# Patient Record
Sex: Male | Born: 1935 | Race: White | Hispanic: No | Smoking: Never smoker
Health system: Southern US, Community
[De-identification: ages and names within clinical notes are randomized; demographics above are authoritative.]

## PROBLEM LIST (undated history)

## (undated) DIAGNOSIS — K219 Gastro-esophageal reflux disease without esophagitis: Secondary | ICD-10-CM

## (undated) DIAGNOSIS — I4891 Unspecified atrial fibrillation: Secondary | ICD-10-CM

## (undated) DIAGNOSIS — F329 Major depressive disorder, single episode, unspecified: Secondary | ICD-10-CM

## (undated) DIAGNOSIS — R569 Unspecified convulsions: Secondary | ICD-10-CM

## (undated) DIAGNOSIS — F32A Depression, unspecified: Secondary | ICD-10-CM

## (undated) DIAGNOSIS — C439 Malignant melanoma of skin, unspecified: Secondary | ICD-10-CM

## (undated) DIAGNOSIS — E860 Dehydration: Secondary | ICD-10-CM

## (undated) DIAGNOSIS — G250 Essential tremor: Secondary | ICD-10-CM

## (undated) DIAGNOSIS — N400 Enlarged prostate without lower urinary tract symptoms: Secondary | ICD-10-CM

## (undated) DIAGNOSIS — M1990 Unspecified osteoarthritis, unspecified site: Secondary | ICD-10-CM

## (undated) DIAGNOSIS — I10 Essential (primary) hypertension: Secondary | ICD-10-CM

## (undated) DIAGNOSIS — F419 Anxiety disorder, unspecified: Secondary | ICD-10-CM

## (undated) DIAGNOSIS — R269 Unspecified abnormalities of gait and mobility: Secondary | ICD-10-CM

## (undated) DIAGNOSIS — R251 Tremor, unspecified: Secondary | ICD-10-CM

## (undated) DIAGNOSIS — IMO0001 Reserved for inherently not codable concepts without codable children: Secondary | ICD-10-CM

## (undated) DIAGNOSIS — H9190 Unspecified hearing loss, unspecified ear: Secondary | ICD-10-CM

## (undated) DIAGNOSIS — R55 Syncope and collapse: Secondary | ICD-10-CM

## (undated) DIAGNOSIS — I340 Nonrheumatic mitral (valve) insufficiency: Secondary | ICD-10-CM

## (undated) HISTORY — DX: Nonrheumatic mitral (valve) insufficiency: I34.0

## (undated) HISTORY — DX: Tremor, unspecified: R25.1

## (undated) HISTORY — PX: CATARACT EXTRACTION W/ INTRAOCULAR LENS  IMPLANT, BILATERAL: SHX1307

## (undated) HISTORY — PX: OTHER SURGICAL HISTORY: SHX169

## (undated) HISTORY — DX: Unspecified abnormalities of gait and mobility: R26.9

## (undated) HISTORY — PX: APPENDECTOMY: SHX54

---

## 1998-05-14 ENCOUNTER — Emergency Department (HOSPITAL_COMMUNITY): Admission: EM | Admit: 1998-05-14 | Discharge: 1998-05-14 | Payer: Self-pay | Admitting: Emergency Medicine

## 2001-02-03 ENCOUNTER — Ambulatory Visit (HOSPITAL_COMMUNITY): Admission: RE | Admit: 2001-02-03 | Discharge: 2001-02-03 | Payer: Self-pay | Admitting: Gastroenterology

## 2001-09-05 ENCOUNTER — Encounter: Payer: Self-pay | Admitting: Geriatric Medicine

## 2001-09-05 ENCOUNTER — Encounter: Admission: RE | Admit: 2001-09-05 | Discharge: 2001-09-05 | Payer: Self-pay | Admitting: Geriatric Medicine

## 2003-11-10 ENCOUNTER — Emergency Department (HOSPITAL_COMMUNITY): Admission: EM | Admit: 2003-11-10 | Discharge: 2003-11-10 | Payer: Self-pay | Admitting: Emergency Medicine

## 2003-11-28 ENCOUNTER — Inpatient Hospital Stay (HOSPITAL_COMMUNITY): Admission: AD | Admit: 2003-11-28 | Discharge: 2003-12-02 | Payer: Self-pay | Admitting: Emergency Medicine

## 2003-11-29 ENCOUNTER — Encounter (INDEPENDENT_AMBULATORY_CARE_PROVIDER_SITE_OTHER): Payer: Self-pay | Admitting: *Deleted

## 2005-11-18 ENCOUNTER — Emergency Department (HOSPITAL_COMMUNITY): Admission: EM | Admit: 2005-11-18 | Discharge: 2005-11-18 | Payer: Self-pay | Admitting: Emergency Medicine

## 2008-09-02 ENCOUNTER — Emergency Department (HOSPITAL_COMMUNITY): Admission: EM | Admit: 2008-09-02 | Discharge: 2008-09-02 | Payer: Self-pay | Admitting: Emergency Medicine

## 2008-09-07 ENCOUNTER — Inpatient Hospital Stay (HOSPITAL_COMMUNITY): Admission: EM | Admit: 2008-09-07 | Discharge: 2008-09-11 | Payer: Self-pay | Admitting: Emergency Medicine

## 2008-10-11 ENCOUNTER — Emergency Department (HOSPITAL_COMMUNITY): Admission: EM | Admit: 2008-10-11 | Discharge: 2008-10-11 | Payer: Self-pay | Admitting: Emergency Medicine

## 2008-12-06 ENCOUNTER — Emergency Department (HOSPITAL_COMMUNITY): Admission: EM | Admit: 2008-12-06 | Discharge: 2008-12-06 | Payer: Self-pay | Admitting: Emergency Medicine

## 2009-03-15 ENCOUNTER — Emergency Department (HOSPITAL_COMMUNITY): Admission: EM | Admit: 2009-03-15 | Discharge: 2009-03-16 | Payer: Self-pay | Admitting: Emergency Medicine

## 2009-03-16 ENCOUNTER — Emergency Department (HOSPITAL_COMMUNITY): Admission: EM | Admit: 2009-03-16 | Discharge: 2009-03-16 | Payer: Self-pay | Admitting: Emergency Medicine

## 2010-08-08 ENCOUNTER — Inpatient Hospital Stay (HOSPITAL_COMMUNITY)
Admission: EM | Admit: 2010-08-08 | Discharge: 2010-08-12 | Payer: Self-pay | Source: Home / Self Care | Attending: Internal Medicine | Admitting: Internal Medicine

## 2010-08-22 ENCOUNTER — Encounter
Admission: RE | Admit: 2010-08-22 | Discharge: 2010-09-26 | Payer: Self-pay | Source: Home / Self Care | Attending: Neurology | Admitting: Neurology

## 2010-09-05 ENCOUNTER — Encounter
Admission: RE | Admit: 2010-09-05 | Discharge: 2010-09-26 | Payer: Self-pay | Source: Home / Self Care | Attending: Neurology | Admitting: Neurology

## 2010-09-28 ENCOUNTER — Ambulatory Visit: Payer: Medicare Other | Attending: Neurology | Admitting: Rehabilitative and Restorative Service Providers"

## 2010-09-28 DIAGNOSIS — M6281 Muscle weakness (generalized): Secondary | ICD-10-CM | POA: Insufficient documentation

## 2010-09-28 DIAGNOSIS — IMO0001 Reserved for inherently not codable concepts without codable children: Secondary | ICD-10-CM | POA: Insufficient documentation

## 2010-09-28 DIAGNOSIS — R269 Unspecified abnormalities of gait and mobility: Secondary | ICD-10-CM | POA: Insufficient documentation

## 2010-10-06 ENCOUNTER — Ambulatory Visit: Payer: Medicare Other | Admitting: Rehabilitative and Restorative Service Providers"

## 2010-10-10 ENCOUNTER — Ambulatory Visit: Payer: Medicare Other | Admitting: Rehabilitative and Restorative Service Providers"

## 2010-10-13 ENCOUNTER — Ambulatory Visit: Payer: Medicare Other | Admitting: Rehabilitative and Restorative Service Providers"

## 2010-10-17 ENCOUNTER — Ambulatory Visit: Payer: Medicare Other | Admitting: Rehabilitative and Restorative Service Providers"

## 2010-10-20 ENCOUNTER — Ambulatory Visit: Payer: Medicare Other | Admitting: Rehabilitative and Restorative Service Providers"

## 2010-10-24 ENCOUNTER — Ambulatory Visit: Payer: Medicare Other | Admitting: Rehabilitative and Restorative Service Providers"

## 2010-10-26 ENCOUNTER — Ambulatory Visit: Payer: Medicare Other | Attending: Neurology | Admitting: Rehabilitative and Restorative Service Providers"

## 2010-10-26 DIAGNOSIS — IMO0001 Reserved for inherently not codable concepts without codable children: Secondary | ICD-10-CM | POA: Insufficient documentation

## 2010-10-26 DIAGNOSIS — M6281 Muscle weakness (generalized): Secondary | ICD-10-CM | POA: Insufficient documentation

## 2010-10-26 DIAGNOSIS — R269 Unspecified abnormalities of gait and mobility: Secondary | ICD-10-CM | POA: Insufficient documentation

## 2010-10-31 ENCOUNTER — Ambulatory Visit: Payer: Medicare Other | Admitting: Rehabilitative and Restorative Service Providers"

## 2010-11-02 ENCOUNTER — Ambulatory Visit: Payer: Medicare Other | Admitting: Rehabilitative and Restorative Service Providers"

## 2010-11-06 LAB — CBC
HCT: 37.6 % — ABNORMAL LOW (ref 39.0–52.0)
HCT: 38.5 % — ABNORMAL LOW (ref 39.0–52.0)
Hemoglobin: 13 g/dL (ref 13.0–17.0)
Hemoglobin: 13.3 g/dL (ref 13.0–17.0)
MCH: 31.4 pg (ref 26.0–34.0)
MCHC: 35 g/dL (ref 30.0–36.0)
MCHC: 34.6 g/dL (ref 30.0–36.0)
MCV: 91.8 fL (ref 78.0–100.0)
MCV: 92.3 fL (ref 78.0–100.0)
Platelets: 229 10*3/uL (ref 150–400)
RBC: 3.99 MIL/uL — ABNORMAL LOW (ref 4.22–5.81)
RBC: 4.17 MIL/uL — ABNORMAL LOW (ref 4.22–5.81)
WBC: 9 10*3/uL (ref 4.0–10.5)

## 2010-11-06 LAB — URINALYSIS, ROUTINE W REFLEX MICROSCOPIC
Bilirubin Urine: NEGATIVE
Glucose, UA: NEGATIVE mg/dL
Specific Gravity, Urine: 1.01 (ref 1.005–1.030)
Urobilinogen, UA: 0.2 mg/dL (ref 0.0–1.0)
pH: 7.5 (ref 5.0–8.0)

## 2010-11-06 LAB — BASIC METABOLIC PANEL
BUN: 14 mg/dL (ref 6–23)
CO2: 27 mEq/L (ref 19–32)
CO2: 27 mEq/L (ref 19–32)
Calcium: 8.7 mg/dL (ref 8.4–10.5)
Chloride: 102 mEq/L (ref 96–112)
Creatinine, Ser: 0.8 mg/dL (ref 0.4–1.5)
GFR calc Af Amer: >60 mL/min (ref >60)
GFR calc non Af Amer: >60 mL/min (ref >60)
GFR calc non Af Amer: >60 mL/min (ref >60)
GFR calc non Af Amer: >60 mL/min (ref >60)
Glucose, Bld: 101 mg/dL — ABNORMAL HIGH (ref 70–99)
Potassium: 3.7 mEq/L (ref 3.5–5.1)
Potassium: 3.4 mEq/L — ABNORMAL LOW (ref 3.5–5.1)
Potassium: 3.8 mEq/L (ref 3.5–5.1)
Sodium: 133 mEq/L — ABNORMAL LOW (ref 135–145)
Sodium: 133 mEq/L — ABNORMAL LOW (ref 135–145)

## 2010-11-06 LAB — HEMOCCULT GUIAC POC 1CARD (OFFICE): Fecal Occult Bld: POSITIVE

## 2010-11-06 LAB — CARDIAC PANEL(CRET KIN+CKTOT+MB+TROPI): Relative Index: 1.6 (ref 0.0–2.5)

## 2010-11-21 ENCOUNTER — Encounter: Payer: Medicare Other | Admitting: Rehabilitative and Restorative Service Providers"

## 2010-11-23 ENCOUNTER — Encounter: Payer: Medicare Other | Admitting: Rehabilitative and Restorative Service Providers"

## 2010-12-03 LAB — URINALYSIS, ROUTINE W REFLEX MICROSCOPIC
Glucose, UA: NEGATIVE mg/dL
Leukocytes, UA: NEGATIVE
Protein, ur: NEGATIVE mg/dL
pH: 7.5 (ref 5.0–8.0)

## 2010-12-03 LAB — CBC
Platelets: 270 10*3/uL (ref 150–400)
RDW: 12.3 % (ref 11.5–15.5)

## 2010-12-03 LAB — DIFFERENTIAL
Basophils Absolute: 0 10*3/uL (ref 0.0–0.1)
Lymphocytes Relative: 53 % — ABNORMAL HIGH (ref 12–46)
Neutro Abs: 3 10*3/uL (ref 1.7–7.7)

## 2010-12-03 LAB — BASIC METABOLIC PANEL
BUN: 18 mg/dL (ref 6–23)
CO2: 25 mEq/L (ref 19–32)
Chloride: 101 mEq/L (ref 96–112)
Creatinine, Ser: 1.02 mg/dL (ref 0.4–1.5)
Glucose, Bld: 132 mg/dL — ABNORMAL HIGH (ref 70–99)
Potassium: 3.9 mEq/L (ref 3.5–5.1)
Sodium: 135 mEq/L (ref 135–145)

## 2010-12-03 LAB — POCT I-STAT, CHEM 8
BUN: 20 mg/dL (ref 6–23)
Chloride: 101 mEq/L (ref 96–112)
Potassium: 4 mEq/L (ref 3.5–5.1)
Sodium: 135 mEq/L (ref 135–145)

## 2010-12-03 LAB — URINE MICROSCOPIC-ADD ON

## 2010-12-06 LAB — POCT I-STAT, CHEM 8
Creatinine, Ser: 0.8 mg/dL (ref 0.4–1.5)
Glucose, Bld: 103 mg/dL — ABNORMAL HIGH (ref 70–99)
Hemoglobin: 15.3 g/dL (ref 13.0–17.0)
Potassium: 3.6 mEq/L (ref 3.5–5.1)

## 2010-12-11 LAB — CBC
HCT: 35.6 % — ABNORMAL LOW (ref 39.0–52.0)
HCT: 43 % (ref 39.0–52.0)
Hemoglobin: 12.2 g/dL — ABNORMAL LOW (ref 13.0–17.0)
Hemoglobin: 14.9 g/dL (ref 13.0–17.0)
MCHC: 34.6 g/dL (ref 30.0–36.0)
MCHC: 34.7 g/dL (ref 30.0–36.0)
MCHC: 35.1 g/dL (ref 30.0–36.0)
MCV: 93.3 fL (ref 78.0–100.0)
MCV: 93.5 fL (ref 78.0–100.0)
MCV: 94.5 fL (ref 78.0–100.0)
Platelets: 287 10*3/uL (ref 150–400)
RBC: 4.09 MIL/uL — ABNORMAL LOW (ref 4.22–5.81)
RBC: 4.42 MIL/uL (ref 4.22–5.81)
RDW: 13.2 % (ref 11.5–15.5)
RDW: 13.3 % (ref 11.5–15.5)
RDW: 13.4 % (ref 11.5–15.5)
WBC: 8.4 10*3/uL (ref 4.0–10.5)

## 2010-12-11 LAB — DIFFERENTIAL
Basophils Absolute: 0 10*3/uL (ref 0.0–0.1)
Basophils Relative: 0 % (ref 0–1)
Eosinophils Absolute: 0 10*3/uL (ref 0.0–0.7)
Eosinophils Relative: 0 % (ref 0–5)
Eosinophils Relative: 0 % (ref 0–5)
Lymphocytes Relative: 33 % (ref 12–46)
Lymphs Abs: 2.8 10*3/uL (ref 0.7–4.0)
Monocytes Absolute: 0.5 10*3/uL (ref 0.1–1.0)
Monocytes Absolute: 0.8 10*3/uL (ref 0.1–1.0)
Neutro Abs: 3.6 10*3/uL (ref 1.7–7.7)
Neutro Abs: 4.8 10*3/uL (ref 1.7–7.7)

## 2010-12-11 LAB — COMPREHENSIVE METABOLIC PANEL
ALT: 25 U/L (ref 0–53)
AST: 37 U/L (ref 0–37)
Albumin: 3.6 g/dL (ref 3.5–5.2)
Alkaline Phosphatase: 61 U/L (ref 39–117)
BUN: 11 mg/dL (ref 6–23)
Chloride: 96 mEq/L (ref 96–112)
GFR calc Af Amer: >60 mL/min (ref >60)
Potassium: 4.1 mEq/L (ref 3.5–5.1)
Sodium: 132 mEq/L — ABNORMAL LOW (ref 135–145)
Total Bilirubin: 0.9 mg/dL (ref 0.3–1.2)
Total Protein: 6.4 g/dL (ref 6.0–8.3)

## 2010-12-11 LAB — POCT CARDIAC MARKERS
CKMB, poc: 1.4 ng/mL (ref 1.0–8.0)
Myoglobin, poc: 191 ng/mL (ref 12–200)
Troponin i, poc: <0.05 ng/mL (ref 0.00–0.09)
Troponin i, poc: <0.05 ng/mL (ref 0.00–0.09)

## 2010-12-11 LAB — GLUCOSE, CAPILLARY: Glucose-Capillary: 142 mg/dL — ABNORMAL HIGH (ref 70–99)

## 2010-12-11 LAB — POCT I-STAT, CHEM 8
BUN: 17 mg/dL (ref 6–23)
Calcium, Ion: 1.12 mmol/L (ref 1.12–1.32)
Calcium, Ion: 1.16 mmol/L (ref 1.12–1.32)
Chloride: 96 mEq/L (ref 96–112)
Creatinine, Ser: 1.1 mg/dL (ref 0.4–1.5)
HCT: 44 % (ref 39.0–52.0)
Hemoglobin: 15 g/dL (ref 13.0–17.0)
Sodium: 131 mEq/L — ABNORMAL LOW (ref 135–145)
TCO2: 21 mmol/L (ref 0–100)
TCO2: 28 mmol/L (ref 0–100)

## 2010-12-11 LAB — BASIC METABOLIC PANEL
BUN: 10 mg/dL (ref 6–23)
CO2: 24 mEq/L (ref 19–32)
Calcium: 8.4 mg/dL (ref 8.4–10.5)
Calcium: 8.7 mg/dL (ref 8.4–10.5)
Creatinine, Ser: 1.11 mg/dL (ref 0.4–1.5)
GFR calc Af Amer: >60 mL/min (ref >60)
GFR calc Af Amer: >60 mL/min (ref >60)
GFR calc non Af Amer: >60 mL/min (ref >60)
GFR calc non Af Amer: >60 mL/min (ref >60)
GFR calc non Af Amer: >60 mL/min (ref >60)
Potassium: 3.9 mEq/L (ref 3.5–5.1)
Sodium: 131 mEq/L — ABNORMAL LOW (ref 135–145)
Sodium: 132 mEq/L — ABNORMAL LOW (ref 135–145)
Sodium: 133 mEq/L — ABNORMAL LOW (ref 135–145)

## 2010-12-11 LAB — CARDIAC PANEL(CRET KIN+CKTOT+MB+TROPI)
CK, MB: 6.2 ng/mL — ABNORMAL HIGH (ref 0.3–4.0)
CK, MB: 6.9 ng/mL — ABNORMAL HIGH (ref 0.3–4.0)
Relative Index: 1.6 (ref 0.0–2.5)
Total CK: 450 U/L — ABNORMAL HIGH (ref 7–232)

## 2010-12-11 LAB — CK TOTAL AND CKMB (NOT AT ARMC)
CK, MB: 5.5 ng/mL — ABNORMAL HIGH (ref 0.3–4.0)
Total CK: 239 U/L — ABNORMAL HIGH (ref 7–232)

## 2010-12-12 LAB — POCT I-STAT, CHEM 8
BUN: 15 mg/dL (ref 6–23)
Chloride: 98 mEq/L (ref 96–112)
HCT: 46 % (ref 39.0–52.0)
Sodium: 135 mEq/L (ref 135–145)
TCO2: 31 mmol/L (ref 0–100)

## 2011-01-09 NOTE — H&P (Signed)
Barry Dean, Barry Dean               ACCOUNT NO.:  [id number]   MEDICAL RECORD NO.:  [id number]          PATIENT TYPE:  INP   LOCATION:  1858                         FACILITY:  [facility]   PHYSICIAN:  Hollice Espy, M.D.DATE OF BIRTH:  [birth year]   DATE OF ADMISSION:  09/07/2008  DATE OF DISCHARGE:                              HISTORY & PHYSICAL   PRIMARY CARE PHYSICIAN:  Hal T. Stoneking, M.D..   CHIEF COMPLAINT:  Syncope.   HISTORY OF PRESENT ILLNESS:  The patient is an 77 year old white male  with past medical history of hypertension and what is presumed to be a  mitral valve regurgitation who presents to the emergency room after a  syncopal episode.  He previously has been doing well with no complaints.  He said he woke up this morning feeling fine, had breakfast.  When he  went to shave he said that is when all of a sudden he felt very  nauseous, got lightheaded and he does not recall much until he woke up  in the emergency room.  Reportedly paramedics were called and he had  some rhythm strips done which showed some mild bradycardia.  There was  an interpretation of acute MI.  However on further interpretation this  was found to be incorrect and the patient was then brought into the  emergency room for further evaluation and treatment.  When the patient  came into the emergency room his blood pressures were noted to be in the  140s.  The patient currently states that he is feeling fine.  Denies any  headaches, vision changes, dysphagia, chest pain, palpitations,  shortness of breath, wheezing, coughing.  No abdominal pain.  No  hematuria, dysuria, constipation, diarrhea, numbness, weakness or pain.  He said he has not really ever gotten any syncopal episodes other than a  previous time several years ago when he took a medicine called Uroxatral  that made him pass out.  He has not had any other nausea problems,  syncopal problems.  His breathing has been rough at night in the  last  few months.  He also had an episode of chest pain that brought him to  the emergency room last week, which was evaluated and felt to be  noncardiac.  The patient otherwise is doing well.   REVIEW OF SYSTEMS:  Otherwise negative.   PAST MEDICAL HISTORY:  Includes:  1. Hypertension.  2. BPH.  3. And reportedly a valvular disorder although I cannot find any      documentation of this.  An echocardiogram done in April 2005 notes      a normal ejection fraction with mildly dilated right ventricle and      borderline left atrium with no valvular disorder reported at his      time, although family states that he has a leaky valve.   MEDICATIONS:  1. The patient is currently on Toprol 50.  2. And Lotrel 5/20.  3. He is on p.r.n. metoprolol.  4. As well as Ativan daily.  5. He does take a multivitamin.   ALLERGIES:  States that  his only allergy is to Uroxatral which made him  pass out and which was documented several years ago.   He denies any tobacco, alcohol or drug use.   FAMILY HISTORY:  Noncontributory.   PHYSICAL EXAMINATION:  VITALS:  On admission temperature 97.6, heart  rate 80, blood pressure 140/52, respirations 22, O2 sat 96% on room air.  IN GENERAL: He is alert and oriented x3.  Currently in no apparent  distress.  HEENT:  Is normocephalic, atraumatic.  His mucous membranes are slightly  dry.  He has no carotid bruits.  HEART:  Regular rate and rhythm.  S1, S2.  He has a very, very soft,  subtle 2/6 systolic ejection murmur.  LUNGS:  Clear to auscultation bilaterally.  ABDOMEN:  Soft, nontender, nondistended.  Positive bowel sounds.  I can appreciate no carotid bruits.  EXTREMITIES:  Show no clubbing, cyanosis or edema.  He has no focal  neurological deficits.  MUSCULOSKELETAL EXAM:  Looks to be just overall minimally diminished but  symmetric.  No focal findings.  He has no loss of sensation.  Otherwise  normal musculoskeletal and neurological exam with grip  extension and  flexion of all extremities.   LABORATORY WORK:  White count 8.5, H and H 12.2 and 36.  MCV 97,  platelet count 257, no shift.  Sodium 131, potassium 4.6, chloride 96,  BUN 17, creatinine 1.1, glucose 145.  CPK 191, MB less than 1, troponin  I less than 0.05.  EKG shows a normal sinus rhythm.  CT scan of the head  shows no evidence of any acute intracranial abnormalities.  Chest x-ray  showed bibasilar fibrosis but no acute cardiopulmonary disease.   ASSESSMENT/PLAN:  Syncope. It is possible this could be from some  subclavian stenoses given his reported history of this happening while  shaving, although this is the first time he said that this has ever  happened and he shaves daily versus arrhythmia versus valvular disorder  versus carotid disease versus other.  We will observe him on telemetry.  Get a Cardiology consult.  The patient sees Dr. Corliss Marcus.  We will  also check bilateral arm blood pressures, monitor.  His family had  brought in his blood pressures and it is noted that over the last week  he has had problems with elevated blood pressure at times in the 180s-  190s with some tachycardia in the 80s to 90s as well, so we will  continue to observe.  I will at this time continue his antihypertensives  and we will go from there.      Hollice Espy, M.D.  Electronically Signed     SKK/MEDQ  D:  09/07/2008  T:  09/07/2008  Job:  [id number]   cc:   Hal T. Stoneking, M.D.  Francisca December, M.D.

## 2011-01-09 NOTE — Consult Note (Signed)
NAMECLOYDE, Barry Dean               ACCOUNT NO.:  [id number]   MEDICAL RECORD NO.:  [id number]          PATIENT TYPE:  INP   LOCATION:  3739                         FACILITY:  [facility]   PHYSICIAN:  Francisca December, M.D.  DATE OF BIRTH:  10/06/1923   DATE OF CONSULTATION:  09/07/2008  DATE OF DISCHARGE:                                 CONSULTATION   REASON FOR CONSULTATION:  Syncope.   HISTORY OF PRESENT ILLNESS:  Mr. Samaj Wessells is a pleasant 77 year old  man who had complained of dizziness throughout the morning of admission  which was September 07, 2008.  He was being readied for blood pressure  checked at Dr. Laverle Hobby office when he had a syncopal spell.  He did  have some dizziness and nausea prior to this.  He denies any chest pain.  He had a syncopal spell last week which included a fall in the bathroom  and shaking all over.  He was seen in the ER on September 02, 2008 for  this and the record states he had chest discomfort, but currently the  patient denies this.  The patient does have a history of syncope in  2005, which had a negative workup including an exercise Cardiolite in  our office.  He has had some dyspnea which is transient and  unpredictable.  In general, the patient is a poor historian.  His wife  is present in the room today and has clarified the incident of last  week.  The patient did fall in the bathroom and shake all over.  He was  not responsive for a period of time and there was no loss of bladder or  bowel control.   PAST MEDICAL HISTORY:  1. Hypertension.  2. Benign prostatic hypertrophy.  3. Syncope in 2005, felt secondary to Uroxatral.  4. History of PAF, asymptomatic.  5. Mitral regurgitation.   CURRENT MEDICATIONS:  1. Lotrel 5/20 one p.o. daily.  2. Toprol-XL 50 mg p.o. daily.  3. Ativan 1 mg p.o. t.i.d. p.r.n.   SOCIAL HISTORY:  Lives in [place] with his wife.  He is retired.  Does not use any alcohol or tobacco products.   FAMILY  HISTORY:  Noncontributory.   REVIEW OF SYSTEMS:  Denies fever, chills, sweats, headache, nasal  congestion, nasal bleeding, or vertigo.  He has had the lightheaded or  dizzy symptoms.  No skin rash or lesions.  No polyuria or polydipsia.  No heat or cold intolerance.  Currently, no urinary symptoms.  No  arthralgia, joint swelling, muscle weakness, or pain.  No nausea,  vomiting, diarrhea, constipation, or melena.   A 2-D echocardiogram obtained in August 2009 showed a posterior mitral  valve leaflet prolapse with a full segment seen in 3-chamber view with  moderate-to-severe mitral regurgitation which was anteriorly directed.  It had progressed significantly since 2007.   PHYSICAL EXAMINATION:  VITAL SIGNS:  Blood pressure on admission 140/52,  pulse is 80 and regular, temperature is 97.6, respiratory rate 22, and  O2 saturation on 2 L is 100%.  GENERAL:  This is a well-appearing 77 year old Caucasian man  in no  distress.  HEENT:  Unremarkable.  Head is atraumatic and normocephalic.  Pupils are  equal, round, and reactive to light.  The sclerae are anicteric.  The  oral mucosa is pink and moist.  Teeth and gums are in good repair.  Tongue is not coated.  NECK:  Supple without thyromegaly or masses.  The carotid upstrokes are  normal.  There is no jugular venous distention.  There is no carotid  bruit.  CHEST:  Clear with adequate excursion.  There are no wheezes, rales, or  rhonchi.  HEART:  Regular rhythm.  Normal S1 and S2 is heard.  A grade 2-3/6  holosystolic murmur is present at the apex which does radiate toward the  base.  There is a soft ejection systolic murmur in the aortic area as  well.  The PMI is not palpable.  ABDOMEN:  Soft, flat, and nontender.  No hepatosplenomegaly or midline  pulsatile mass.  EXTREMITIES:  Lower extremities have no edema and distal pulses are  intact.  MUSCULOSKELETAL:  Unremarkable.  The patient is able to stand and walk.  NEUROLOGIC:   Cranial nerves II-XII are intact.  Motor and sensory  grossly intact.  He is alert and oriented x3 and has a normal mood and  affect.   LABORATORY DATA:  Electrocardiogram on admission shows sinus rhythm,  nonspecific T-wave abnormalities, and rate of 84 beats per minute.  Point-of-care enzymes negative x1 in the emergency room.  Chest x-ray  shows bibasilar fibrosis which is chronic.  His head CT is nonacute  without contrast.  Laboratory assessment otherwise unremarkable for CBC  and BMET.   ASSESSMENT:  1. Syncopal episodes but with extensive musculoskeletal component in      the way of shaking all over.  Certainly concerned for seizure      disorder.  However, the initial brain hypoxia can certainly produce      these similar symptoms, although briefly.  2. Hypertension, reinitiate blood pressure medications in the near      future.  3. Benign prostatic hypertrophy.  4. Murmur/mitral regurgitation which is moderately severe, does not      appear to be playing a role in these symptoms.  He denies      significant worsening shortness of breath.   PLAN:  1. I agree with your management thus far.  We will obtain orthostatic      blood pressures, monitor his heart rhythm, and obtain cardiac      enzymes.  2. Suggest neurologic evaluation.  3. We will obtain 2-D echocardiogram report from the office, perhaps      repeat here in the hospital.  4. Further assessment to be based on the results of the above.      Francisca December, M.D.  Electronically Signed     JHE/MEDQ  D:  09/08/2008  T:  09/08/2008  Job:  [id number]   cc:   Hal T. Stoneking, M.D.

## 2011-01-09 NOTE — Consult Note (Signed)
[name], Barry Dean               ACCOUNT NO.:  [id number]   MEDICAL RECORD NO.:  [id number]          PATIENT TYPE:  INP   LOCATION:  3739                         FACILITY:  [facility]   PHYSICIAN:  Marlan Palau, M.D.  DATE OF BIRTH:  Sep 12, 1923   DATE OF CONSULTATION:  DATE OF DISCHARGE:                                 CONSULTATION   HISTORY OF PRESENT ILLNESS:  Barry Dean is an 77 year old right-  handed white male born in 09/29/1923, with a history of  hypertension.  The patient has suffered syncopal event in 2005, and  workup at that point was unremarkable.  The patient was felt to have had  a reaction to Uroxatral that he had just been started on.  The patient  has gone several years without any problems, but on the day of this  admission the patient has had 2 such events.  The patient had the first  unwitnessed event while shaving this morning, felt dizzy right before  the event, does not remember anything until he got to the hospital.  The  patient had a witnessed second event in the hospital when he was  standing up getting orthostatic blood pressures and then suddenly again  felt dizzy, was placed back to bed and had a generalized seizure event,  and was postictal for at least 30 minutes following the event.  The  patient has had head scan that shows evidence of posterior white matter  changes, but nothing acute was seen.  Neurology was asked to see this  patient for further evaluation.  The patient did have urinary  incontinence with the event today in the hospital.   PAST MEDICAL HISTORY:  Significant for:  1. Syncope/seizure.  Description is most consistent with a seizure      event today.  2. Hypertension.  3. Benign prostatic hypertrophy.  4. Bilateral cataract surgery.  5. Appendectomy for appendicitis.   CURRENT MEDICATIONS:  1. Lotrel 5/20 mg daily.  2. Toprol-XL 50 mg daily.  3. Flomax 0.4 mg at night.  4. Ativan 0.5 mg 1 at night.  5.  Multivitamins.  6. Aspirin 81 mg daily.   SOCIAL HISTORY:  The patient does not smoke or drink.  This patient is  married, lives in the [place], [place] area, has 2 children.  One child died in early age around 4, cause is not clear.  Other child  is alive and well.  The patient is retired.   FAMILY MEDICAL HISTORY:  Notable with both parents have passed away.  The patient believes that his mother died with heart attack and father  died of old age.  The patient has 1 sister with senile dementia of  Alzheimer type.  One brother living in good health and one brother has  passed away, cause unknown.   REVIEW OF SYSTEMS:  Notable for no recent fevers or chills.  The patient  denies headache, vision changes, numbness or weakness on arms, legs.  Denies chest pain, shortness of breath.  Did note some nausea, vomiting  earlier today.  Denies any problems controlling the  bowels or bladder  usually.   PHYSICAL EXAMINATION:  VITALS:  Blood pressure is currently 155/79,  heart rate 76, respiratory rate 20, and temperature afebrile.  GENERAL:  This patient is a fairly well-developed elderly white male who  is alert and cooperative at the time of examination.  The patient is  hard of hearing, has bilateral hearing aids.  HEENT:  Head is atraumatic.  Eyes:  Pupils are postsurgical round and  reactive to light.  Disks are flat bilaterally.  NECK:  Supple.  No carotid bruits are noted.  RESPIRATORY:  Clear.  CARDIOVASCULAR:  Regular rate and rhythm.  No obvious murmurs or rubs  noted.  EXTREMITIES:  Without significant edema.  NEUROLOGIC:  Cranial nerves as above.  Facial symmetry is present.  The  patient has good sensation of face to pinprick, soft touch bilaterally.  He has good strength of facial muscles and the muscles of head turn and  shoulder shrug bilaterally.  Speech is well enunciated, not aphasic.  Again extraocular movements are full.  Visual fields are full.  Motor  test  reveals good strength in all fours.  Good symmetric motor tone is  noted throughout.  Sensory testing is intact to pinprick, soft touch  sensation throughout.  The patient has stocking-glove pinprick sensory  deficit up to the knees with significant impairment of vibratory  sensation of both feet, but in a symmetric fashion more normal on the  arms.  The patient has fairly good finger-nose-finger and toe-to-finger  bilaterally.  Gait was not tested.  Deep tendon reflexes are symmetric,  depressed at the ankles.  Toes are neutral bilaterally.   LABORATORY VALUES:  Notable for a white count of 8.4, hemoglobin of  12.2, hematocrit of 35.6, MCV of 95.7, platelets of 257.  Sodium 131,  potassium 4.6, chloride of 96, glucose of 145, BUN of 17, creatinine of  1.1, CK of 239, MB fraction of 5.5, troponin I of 0.02.   CT scan of the head is as above.   IMPRESSION:  1. History of syncope/seizure.  2. Hypertension.   This patient had a witnessed event in the hospital today that appeared  to be most consistent with a seizure event.  No clear blood pressure  drop was noted prior to the onset of the event.  The patient had a  generalized jerking with a postictal period.  The patient does have some  posterior white matter changes, representing cerebrovascular disease by  CT scan, which may be a cause of underlying seizures.  I will pursue  further workup at this point.   PLAN:  1. We will obtain an MRI of the brain.  2. MRI angiogram of the intracranial vessels.  3. EEG study.  4. Cardiac monitoring.  5. We will initiate oral Dilantin therapy at this point due to      presence of a probable peripheral neuropathy by examination.  We      will check B12 level in a.m.      Marlan Palau, M.D.  Electronically Signed     CKW/MEDQ  D:  09/07/2008  T:  09/08/2008  Job:  [id number]   cc:   Hal T. Stoneking, M.D.

## 2011-01-09 NOTE — Procedures (Signed)
CLINICAL HISTORY:  An 77 year old gentleman who was admitted for a  syncopal episode.  He became lightheaded and nauseous prior to the  episode.   MEDICATIONS:  1. Dilantin.  2. Norvasc.  3. Zestril.  4. Toprol.  5. Lovenox.  6. Aspirin.  7. Protonix.  8. Ambien.  9. Ativan.   TECHNICAL COMMENT:  An 18-channel EEG was performed based on standard  international 10-20 system.  Total recording time 23 minutes,  A 17th  channel was dedicated to EKG which demonstrated normal sinus rhythm of  72 beats per minutes.   Upon awakening, the posterior background activity was well developed, 9  Hz, symmetric, reactive to eye opening and closure.  There was no  epileptiform discharge recorded.  Photic stimulation with flash  frequency 1-19 Hz and hyperventilation was attempted.  There was not  abnormality elicited.  The patient was not able to retrieve sleep during  recording.   CONCLUSION:  This is a normal awake EEG.  There is no evidence of  epileptiform discharge.      Levert Feinstein, MD  Electronically Signed     UE:AVWU  D:  09/09/2008 16:19:43  T:  09/10/2008 05:46:43  Job #:  [id number]

## 2011-01-09 NOTE — Discharge Summary (Signed)
Barry Dean               ACCOUNT NO.:  [id number]   MEDICAL RECORD NO.:  [id number]          PATIENT TYPE:  INP   LOCATION:  3739                         FACILITY:  [facility]   PHYSICIAN:  Barry Harvest, MD    DATE OF BIRTH:  04/12/1924   DATE OF ADMISSION:  09/07/2008  DATE OF DISCHARGE:  09/10/2008                               DISCHARGE SUMMARY   PRIMARY CARE PHYSICIAN:  Dr. Merlene Dean.   DISCHARGE DIAGNOSES:  1. Syncope likely secondary to seizures.  2. New onset seizures.  3. Hypertension.  4. Benign prostatic hypertrophy.  5. Bilateral cataract surgery.  6. Status post appendectomy.  7. History of paroxysmal atrial fibrillation.  8. History of mitral regurgitation.  9. Prior history of syncope in 2005 felt to be secondary to Urotraxil.   DISCHARGE MEDICATIONS:  1. Lotrel 05/20 mg p.o. daily  2. Toprol XL 50 mg p.o. daily.  3. Ativan 0.5 mg p.o. at bedtime.  4. Multivitamin 1 tablet daily.  5. Flomax 0.4 mg p.o. daily.  6. Aspirin 81 mg p.o. daily.  7. Dilantin 300 mg p.o. at bedtime.   DISPOSITION AND FOLLOWUP:  The patient will be discharged home.  The  patient will need to call to schedule a follow-up appointment with Dr.  Anne Dean of [facility] in 10 days for a Dilantin level check or  may be checked by his PCP.  The number at [facility] is [phone].  The patient will also need a follow-up appointment with Dr.  Anne Dean of [facility] for follow-up on these seizures.  The  patient will need to follow up with PCP in 2 weeks.  On followup, a  basic metabolic profile will need to be checked to follow up on the  patient's electrolytes and renal function.   CONSULTATIONS:  1. A cardiology consult was done.  The patient was seen in      consultation by Dr. Amil Dean of [facility] on September 07, 2008. 2.  A neurology consult was done.  The patient was seen in      consultation by Dr. Lesia Dean on September 07, 2008.   PROCEDURE PERFORMED:  1. A CT of the head without contrast was performed on September 07, 2008      that showed no acute intracranial abnormality.  2. Chest x-ray was done on September 07, 2008 which showed bibasilar      fibrosis, no acute cardiopulmonary disease.  3. Head CT done on September 07, 2008 showed chronic small-vessel      ischemic changes unchanged, no acute intracranial abnormality.  4.      MRI/MRA of the head and neck was done on September 08, 2008 that      showed no acute abnormality.  No significant stenosis or occlusion      in the circle of Willis.  4. An EEG was done on September 09, 2008 that showed a normal awake EEG.      There was no evidence of epileptiform discharge.   ADMISSION HISTORY AND PHYSICAL:  Mr. Barry Dean  is an 77 year old  white gentleman with a past medical history of hypertension who had  presumed mitral valve regurgitation who presented to the ED after a  syncopal episode.  The patient had previously had been doing well with  no complaints.  He stated that he woke up in the morning feeling fine,  had breakfast, and when he went to shave, he said all of a sudden, he  felt very nauseous, got lightheaded and does not recall much until he  woke up in the emergency room.  Reportedly paramedics were called and he  had some on rhythm strips done which showed mild bradycardia.  There was  an interpretation of an acute MI.  However, on further interpretation  this was found to be incorrect and the patient was brought into the  emergency department for further evaluation and treatment.  When the  patient came to the ED, his blood pressures were noted to be in the  140s.  The patient currently stated that he was feeling fine, denied any  headaches, no visual changes, no dysphagia, no chest pain or  palpitations.  No shortness of breath, no wheezing or coughing.  No  abdominal pain, hematuria or dysuria, no constipation or diarrhea.  No  numbness, no weakness or  pain.  The patient stated that he had not  really ever gotten any syncopal episodes other than the previous times  several years ago secondary to Urotraxil which made him pass out.  The  patient has not had any nausea or syncopal problems since then.  Breathing had been rough at night in the last few months.  The patient  also had an episode of chest pain that brought into the emergency room 1  week prior to admission, was evaluated and felt to be noncardiac in  nature and was doing otherwise fine.   PHYSICAL EXAMINATION:  Physical exam per admitting physician:  Temperature 97.6, pulse of 80, blood pressure 140/52, respirations 22,  satting 96% on room air.  GENERAL: The patient alert and oriented x3, in no apparent distress.  HEENT: Normocephalic, atraumatic.  Pupils equal, round and reactive to  light.  Extraocular movements intact.  Oropharynx was clear.  Mucous  membranes were slightly dry.  No carotid bruits.  CARDIOVASCULAR: Regular rate and rhythm.  S1-S2, a very soft 2/6  systolic ejection murmur.  LUNGS:  Clear to auscultation bilaterally.  ABDOMEN: Soft, nontender, nondistended, positive bowel sounds.  EXTREMITIES: No clubbing, cyanosis or edema.  No focal neurological  symptoms.  MUSCULOSKELETAL:  The patient looked overall minimally diminished but  symmetric.  No focal findings.  No loss of sensation. NEUROLOGICAL:  Grip extension and flexion of all extremities   LABORATORY DATA:  Admission labs:  CBC:  White count 8.5, hemoglobin  12.2, hematocrit 36, MCV 97, platelets 257.  Sodium 131, potassium 4.6,  chloride 96, BUN 17, creatinine 1.1, glucose of 145.  CPK 191, MB less  than 1, troponin-I less than 0.05.  EKG with normal sinus rhythm.  Head  CT as stated above.  Chest x-rays as stated above.   HOSPITAL COURSE:  Problem #1.  Syncope.  The patient was admitted  initially for a syncopal episode.  Initial differential included  neurocardiogenic etiology likely secondary  to an arrhythmia versus a  valvular disorder versus carotid disease.  Cardiology was consulted.  The patient was seen in consultation by Dr. Amil Dean on January 12 of  2010.  It was uncertain as to what the patient's  syncopal episodes were  but it was felt that as the patient had an extensive musculoskeletal  component to it, may have been likely secondary to seizure induced.  An  MRI/MRA of the head and neck was obtained as stated above with results  as stated.  Orthostatics were checked.  The patient was not orthostatic.  The patient was also placed on IV fluids and it was felt that 2-D echo  was normal warranted at that time as a copy was available to the  cardiologist from his office.  The patient was maintained on telemetry  and during the hospitalization had no signs of arrhythmias.  On the  initial first day of stay in the hospitalization, once the patient got  to the room, orthostatics were being checked when the patient suddenly  felt dizzy, was placed back in bed and had a witnessed generalized  seizure event and was postictal for approximately 30 minutes following  the event.  Head CT obtained on stated above.  Neurology was consulted.  The patient was seen in consultation by Dr. Lesia Dean on September 07, 2008 and MRI/MRA was then ordered.  It was felt the patient would  benefit from an EEG.  The patient was placed on Dilantin as well.  The  patient did not have any further witnessed episodes of seizures or  syncope during the rest of the hospitalization.  MRI/MRA was obtained  with results as stated above.  EEG was also obtained with results as  stated above.  The patient was maintained on Dilantin 300 mg at bedtime.  The patient remained in stable and improved condition.  By day of  discharge, the patient was in stable and improved condition and it was  felt the patient could be discharged home safely.  The patient will need  a Dilantin level checked in 10 days.  The patient  will need to call Dr.  Anne Dean' office to arrange for this Dilantin level to be checked and the  patient will need a follow-up appointment with Dr. Anne Dean within the  next 2-3 weeks.  The patient will also need further follow-up with his  PCP as outpatient.  It was felt per cardiology that the patient's  syncopal episode was not cardiogenic in nature but likely secondary to  his new onset seizures.  The patient will be discharged home in stable  and improved condition.  Problem #2.  New onset seizures.  The patient during the first day of  hospitalization while being admitted for workup of his syncope was noted  to have another syncopal event which was proceeded with a witnessed  generalized seizure.  The patient had remained postictal for  approximately 30 minutes.  Neurology was consulted and also felt that  the patient's syncope was secondary to his seizure event.  The patient  was placed on Dilantin.  MRI/MRA of the head and neck was obtained which  came back negative.  An EEG was also obtained which was normal.  The  patient had no further episodes of seizure events during the  hospitalization and we will discharged home on Dilantin 300 mg at  bedtime.  The patient will need further follow-up with neurology as an  outpatient.   The rest of the patient's chronic medical issues were stable throughout  the hospitalization and the patient will be discharged in stable and  improved condition.   On day of discharge vital signs were temperature 98.4, pulse of 61,  blood pressure 137/62, respiratory  rate 20, satting 94% on room air.   Discharge labs:  Sodium 133, potassium 3.9, chloride 98, bicarb 26, BUN  10, creatinine 0.89, glucose of 88, calcium of 8.8, B12 of 321.  EEG was  a normal EEG.   It was a pleasure taking care of Mr. Jerico Grisso with his Dr.  Janee Morn.      Barry Harvest, MD  Electronically Signed     DT/MEDQ  D:  09/10/2008  T:  09/10/2008  Job:  [id number]   cc:    Hal T. Stoneking, M.D.  Marlan Palau, M.D.  Francisca December, M.D.

## 2011-03-06 ENCOUNTER — Emergency Department (HOSPITAL_COMMUNITY)
Admission: EM | Admit: 2011-03-06 | Discharge: 2011-03-06 | Disposition: A | Discharge status: Home / Self Care | Payer: Medicare Other | Attending: Orthopaedic Surgery | Admitting: Orthopaedic Surgery

## 2011-03-06 ENCOUNTER — Emergency Department (HOSPITAL_COMMUNITY): Payer: Medicare Other

## 2011-03-06 DIAGNOSIS — M79609 Pain in unspecified limb: Secondary | ICD-10-CM | POA: Insufficient documentation

## 2011-03-06 DIAGNOSIS — IMO0002 Reserved for concepts with insufficient information to code with codable children: Secondary | ICD-10-CM | POA: Insufficient documentation

## 2011-03-06 DIAGNOSIS — S91109A Unspecified open wound of unspecified toe(s) without damage to nail, initial encounter: Secondary | ICD-10-CM | POA: Insufficient documentation

## 2011-03-06 DIAGNOSIS — M546 Pain in thoracic spine: Secondary | ICD-10-CM | POA: Insufficient documentation

## 2011-03-06 DIAGNOSIS — G20 Parkinson's disease: Secondary | ICD-10-CM | POA: Insufficient documentation

## 2011-03-06 DIAGNOSIS — L609 Nail disorder, unspecified: Secondary | ICD-10-CM | POA: Insufficient documentation

## 2011-03-06 DIAGNOSIS — M542 Cervicalgia: Secondary | ICD-10-CM | POA: Insufficient documentation

## 2011-03-06 DIAGNOSIS — Y92009 Unspecified place in unspecified non-institutional (private) residence as the place of occurrence of the external cause: Secondary | ICD-10-CM | POA: Insufficient documentation

## 2011-03-06 DIAGNOSIS — G40909 Epilepsy, unspecified, not intractable, without status epilepticus: Secondary | ICD-10-CM | POA: Insufficient documentation

## 2011-03-06 DIAGNOSIS — Z7982 Long term (current) use of aspirin: Secondary | ICD-10-CM | POA: Insufficient documentation

## 2011-03-06 DIAGNOSIS — G20A1 Parkinson's disease without dyskinesia, without mention of fluctuations: Secondary | ICD-10-CM | POA: Insufficient documentation

## 2011-03-06 DIAGNOSIS — M545 Low back pain, unspecified: Secondary | ICD-10-CM | POA: Insufficient documentation

## 2011-03-06 DIAGNOSIS — Z79899 Other long term (current) drug therapy: Secondary | ICD-10-CM | POA: Insufficient documentation

## 2011-03-06 DIAGNOSIS — I10 Essential (primary) hypertension: Secondary | ICD-10-CM | POA: Insufficient documentation

## 2011-03-06 DIAGNOSIS — W010XXA Fall on same level from slipping, tripping and stumbling without subsequent striking against object, initial encounter: Secondary | ICD-10-CM | POA: Insufficient documentation

## 2011-03-19 ENCOUNTER — Encounter: Payer: Self-pay | Admitting: Podiatry

## 2011-05-24 ENCOUNTER — Emergency Department (HOSPITAL_COMMUNITY): Payer: Medicare Other

## 2011-05-24 ENCOUNTER — Inpatient Hospital Stay (HOSPITAL_COMMUNITY)
Admission: EM | Admit: 2011-05-24 | Discharge: 2011-05-29 | DRG: 092 | Disposition: A | Discharge status: Skilled Nursing Facility | Payer: Medicare Other | Attending: Internal Medicine | Admitting: Internal Medicine

## 2011-05-24 DIAGNOSIS — R627 Adult failure to thrive: Secondary | ICD-10-CM | POA: Diagnosis present

## 2011-05-24 DIAGNOSIS — Z79899 Other long term (current) drug therapy: Secondary | ICD-10-CM

## 2011-05-24 DIAGNOSIS — R0902 Hypoxemia: Secondary | ICD-10-CM

## 2011-05-24 DIAGNOSIS — Z66 Do not resuscitate: Secondary | ICD-10-CM | POA: Diagnosis present

## 2011-05-24 DIAGNOSIS — Z7982 Long term (current) use of aspirin: Secondary | ICD-10-CM

## 2011-05-24 DIAGNOSIS — G20 Parkinson's disease: Secondary | ICD-10-CM | POA: Diagnosis present

## 2011-05-24 DIAGNOSIS — N400 Enlarged prostate without lower urinary tract symptoms: Secondary | ICD-10-CM | POA: Diagnosis present

## 2011-05-24 DIAGNOSIS — G40209 Localization-related (focal) (partial) symptomatic epilepsy and epileptic syndromes with complex partial seizures, not intractable, without status epilepticus: Secondary | ICD-10-CM | POA: Diagnosis present

## 2011-05-24 DIAGNOSIS — G20A1 Parkinson's disease without dyskinesia, without mention of fluctuations: Secondary | ICD-10-CM | POA: Diagnosis present

## 2011-05-24 DIAGNOSIS — I4891 Unspecified atrial fibrillation: Secondary | ICD-10-CM | POA: Diagnosis present

## 2011-05-24 DIAGNOSIS — R0789 Other chest pain: Secondary | ICD-10-CM | POA: Diagnosis not present

## 2011-05-24 DIAGNOSIS — G252 Other specified forms of tremor: Secondary | ICD-10-CM | POA: Diagnosis present

## 2011-05-24 DIAGNOSIS — N390 Urinary tract infection, site not specified: Secondary | ICD-10-CM

## 2011-05-24 DIAGNOSIS — Z9181 History of falling: Secondary | ICD-10-CM

## 2011-05-24 DIAGNOSIS — G250 Essential tremor: Secondary | ICD-10-CM | POA: Diagnosis present

## 2011-05-24 DIAGNOSIS — K219 Gastro-esophageal reflux disease without esophagitis: Secondary | ICD-10-CM | POA: Diagnosis present

## 2011-05-24 DIAGNOSIS — R269 Unspecified abnormalities of gait and mobility: Principal | ICD-10-CM | POA: Diagnosis present

## 2011-05-24 DIAGNOSIS — I10 Essential (primary) hypertension: Secondary | ICD-10-CM | POA: Diagnosis present

## 2011-05-24 DIAGNOSIS — Z85820 Personal history of malignant melanoma of skin: Secondary | ICD-10-CM

## 2011-05-24 DIAGNOSIS — E860 Dehydration: Secondary | ICD-10-CM | POA: Diagnosis present

## 2011-05-24 DIAGNOSIS — H9190 Unspecified hearing loss, unspecified ear: Secondary | ICD-10-CM | POA: Diagnosis present

## 2011-05-24 LAB — DIFFERENTIAL
Basophils Relative: 0 % (ref 0–1)
Monocytes Relative: 12 % (ref 3–12)
Neutro Abs: 2.2 10*3/uL (ref 1.7–7.7)
Neutrophils Relative %: 40 % — ABNORMAL LOW (ref 43–77)

## 2011-05-24 LAB — COMPREHENSIVE METABOLIC PANEL
ALT: 18 U/L (ref 0–53)
AST: 23 U/L (ref 0–37)
Albumin: 3.9 g/dL (ref 3.5–5.2)
Alkaline Phosphatase: 89 U/L (ref 39–117)
Chloride: 96 mEq/L (ref 96–112)
Potassium: 3.8 mEq/L (ref 3.5–5.1)
Sodium: 132 mEq/L — ABNORMAL LOW (ref 135–145)
Total Bilirubin: 0.6 mg/dL (ref 0.3–1.2)

## 2011-05-24 LAB — URINALYSIS, ROUTINE W REFLEX MICROSCOPIC
Glucose, UA: NEGATIVE mg/dL
Hgb urine dipstick: NEGATIVE
Ketones, ur: NEGATIVE mg/dL
Protein, ur: NEGATIVE mg/dL
pH: 8 (ref 5.0–8.0)

## 2011-05-24 LAB — CARDIAC PANEL(CRET KIN+CKTOT+MB+TROPI)
Relative Index: 3.4 — ABNORMAL HIGH (ref 0.0–2.5)
Troponin I: <0.3 ng/mL (ref <0.30)

## 2011-05-24 LAB — POCT I-STAT TROPONIN I: Troponin i, poc: 0 ng/mL (ref 0.00–0.08)

## 2011-05-24 LAB — CBC
Hemoglobin: 14.5 g/dL (ref 13.0–17.0)
MCH: 32.4 pg (ref 26.0–34.0)
RBC: 4.47 MIL/uL (ref 4.22–5.81)
WBC: 5.6 10*3/uL (ref 4.0–10.5)

## 2011-05-25 ENCOUNTER — Inpatient Hospital Stay (HOSPITAL_COMMUNITY): Payer: Medicare Other

## 2011-05-25 LAB — URINE CULTURE
Colony Count: NO GROWTH
Culture  Setup Time: 201209271220

## 2011-05-25 LAB — CARDIAC PANEL(CRET KIN+CKTOT+MB+TROPI)
Relative Index: 2.9 — ABNORMAL HIGH (ref 0.0–2.5)
Relative Index: 2.6 — ABNORMAL HIGH (ref 0.0–2.5)
Total CK: 149 U/L (ref 7–232)
Troponin I: <0.3 ng/mL (ref <0.30)

## 2011-05-25 LAB — TSH: TSH: 1.345 u[IU]/mL (ref 0.350–4.500)

## 2011-05-26 LAB — BASIC METABOLIC PANEL
Calcium: 9.1 mg/dL (ref 8.4–10.5)
Creatinine, Ser: 0.68 mg/dL (ref 0.50–1.35)
GFR calc non Af Amer: >60 mL/min (ref >60)
Sodium: 130 mEq/L — ABNORMAL LOW (ref 135–145)

## 2011-05-26 NOTE — Procedures (Signed)
EEG NUMBER:  REFERRING PHYSICIAN:  Jonny Ruiz, MD  HISTORY:  An 77 year old male found on the floor with confusion.  He has a history of seizures.  MEDICATIONS:  Vimpat, Keppra, Ativan, Lovenox, aspirin, Norvasc, Lotensin, clonidine, Normodyne, and Tylenol.  CONDITIONS OF RECORDING:  This is a 16-channel EEG carried out with patient in the awake and drowsy states.  DESCRIPTION:  The waking background activity consists of a low-voltage symmetrical fairly well-organized 8 Hz alpha activity seen from the parieto-occipital and posterotemporal regions.  Low-voltage fast activity poorly organized was seen anteriorly and at times superimposed on more posterior rhythms.  A mixture of theta and alpha was seen from the central and temporal regions.  The patient drowses with slowing to irregular which is theta and beta activity.  Stage II sleep was not obtained.  Hyperventilation was not performed.  Intermittent photic stimulation failed to elicit any change in the tracing.  IMPRESSION:  This is a normal EEG.          ______________________________ Thana Farr, MD    ZO:XWRU D:  05/25/2011 18:00:37  T:  05/25/2011 21:05:11  Job #:  [id number]

## 2011-05-27 LAB — BASIC METABOLIC PANEL
Calcium: 9.1 mg/dL (ref 8.4–10.5)
GFR calc Af Amer: 45 mL/min — ABNORMAL LOW (ref >60)
GFR calc non Af Amer: 37 mL/min — ABNORMAL LOW (ref >60)
Potassium: 3.5 mEq/L (ref 3.5–5.1)
Sodium: 127 mEq/L — ABNORMAL LOW (ref 135–145)

## 2011-05-28 LAB — BASIC METABOLIC PANEL
BUN: 22 mg/dL (ref 6–23)
Chloride: 98 mEq/L (ref 96–112)
Creatinine, Ser: 0.89 mg/dL (ref 0.50–1.35)
GFR calc Af Amer: 88 mL/min — ABNORMAL LOW (ref >90)
GFR calc non Af Amer: 76 mL/min — ABNORMAL LOW (ref >90)
Potassium: 3.8 mEq/L (ref 3.5–5.1)

## 2011-05-28 LAB — LEVETIRACETAM LEVEL: Levetiracetam Lvl: 6.4 ug/mL (ref 5.0–30.0)

## 2011-05-29 LAB — BASIC METABOLIC PANEL
BUN: 13 mg/dL (ref 6–23)
Chloride: 99 mEq/L (ref 96–112)
Creatinine, Ser: 0.8 mg/dL (ref 0.50–1.35)
GFR calc Af Amer: >90 mL/min (ref >90)
GFR calc non Af Amer: 79 mL/min — ABNORMAL LOW (ref >90)
Potassium: 3.8 mEq/L (ref 3.5–5.1)

## 2011-05-30 NOTE — Discharge Summary (Signed)
NAMEWELDON, Barry               ACCOUNT NO.:  [id number]  MEDICAL RECORD NO.:  [id number]  LOCATION:  5021                         FACILITY:  [facility]  PHYSICIAN:  Isidor Holts, M.D.  DATE OF BIRTH:  July 27, 1924  DATE OF ADMISSION:  05/24/2011 DATE OF DISCHARGE:  05/29/2011                        DISCHARGE SUMMARY.   PRIMARY MD:  Hal T. Pete Glatter, MD  PRIMARY NEUROLOGISTS: 1. Marlan Palau, MD 2. Deanna Artis. Sharene Skeans, MD  DISCHARGE DIAGNOSES: 1. Recurrent falls. 2. Multifactorial gait instability, causing recurrent falls. 3. Parkinsonism. 4. Essential tremor. 5. Complex partial seizures. 6. Nonspecific chest pain. 7. History of paroxysmal atrial fibrillation.  Not a Coumadin     candidate, secondary to high fall risk. 8. Gastroesophageal reflux disease. 9. Deafness. 10.Mild dehydration.  DISCHARGE MEDICATIONS: 1. Hydrocodone/APAP (5/325) 1 tablet p.o. p.r.n. q.6 hourly for pain. 2. Labetalol 100 mg p.o. b.i.d.3. Protonix 40 mg p.o. daily. 4. Clonidine 0.2 mg p.o. b.i.d. 5. Lorazepam 0.5 mg p.o. p.r.n. nightly for anxiety/insomnia. 6. Aspirin 81 mg p.o. q. evening. 7. Keppra 750 mg p.o. b.i.d. 8. Lotrel (10/40) 1 tablet p.o. daily. 9. Vimpat 150 mg p.o. daily.  Note: Lasix has been discontinued, secondary to dehydration.  PROCEDURES: 1. Chest x-ray on May 24, 2011, this showed stable basilar     fibrosis.  No active disease. 2. Pelvic x-ray on May 24, 2011, this was negative. 3. Head CT scan on May 24, 2011, this showed atrophy and chronic     microvascular ischemia.  No acute intracranial abnormality. 4. CT cervical spine on May 24, 2011, this showed levoscoliosis     in the lower cervical spine, negative for fracture. 5. EEG on May 25, 2011.  This was a normal EEG.  CONSULTATIONS:  None.  ADMISSION HISTORY:  As in H and P notes of May 24, 2011, dictated by Dr. Jonny Ruiz.  However, in brief, this is an 77 year old  male with known history of hypertension, parkinsonism, benign essential tremor, complex partial seizure disorder, BPH, deafness, paroxysmal atrial fibrillation, history of melanoma, GERD, status post melanoma removal, status post iridectomy with lens implant, status post appendectomy, presenting following a fall.  This was unwitnessed.  He was found lying next to his bed on the floor in the a.m. by his daughter, and was unable to get up by himself.  He does have a history of recurrent falls, last one about 1-2 weeks previously.  The patient was subsequently brought to the emergency department and admitted for further evaluation, investigation, and management.  CLINICAL COURSE: 1. Recurrent falls.  The patient has gait instability which is     multifactorial, given his movement disorders and known seizure     disorder.  He has once again sustained a fall.  Fortunately, the     imaging studies demonstrated no evidence of bony injuries.  He was     managed with p.r.n. analgesics, without any deleterious     effects.  2. Complex partial seizure disorder.  The patient had no episodes     recorded during this hospitalization.  His EEG of May 25, 2011, was normal.  He continued on preadmission anticonvulsant  medication.  3. History of parkinsonism.  The patient is stable from this     viewpoint.  4. Nonspecific chest pain.  The patient did complain of chest pain at     the time of presentation.  A 12-lead EKG was devoid of acute     ischemic changes.  Cardiac enzymes were cycled, remained     unelevated.  Telemetric monitoring showed no evidence of     arrhythmias, and the patient had no recurrence of chest pain during     the course of this hospitalization.  This chest pain was atypical     in nature.  Certainly, he had no acute coronary syndrome.  GERD have     been the culprit.  Be that as it may, he was managed with proton     pump inhibitor.  5. History of paroxysmal  atrial fibrillation.  The patient clearly is     not a candidate for anticoagulation, given his propensity to falls.     He remained in sinus rhythm during the course of this     hospitalization.  6. Mild dehydration.  The patient's BUN was 13, creatinine 0.70 at the     time of presentation.  He was initially commenced on IV fluids, but     this was discontinued on May 25, 2011.  On May 27, 2011, however, BUN had jumped to 35, creatinine 1.76, likely     because of the patient's inadequate oral fluid intake.  He was     recommenced on intravenous fluids and his preadmission diuretics     have certainly not been recommenced.  We are pleased to note that as of May 28, 2011, his BUN had improved at 22 with a creatinine of 0.89.  DISPOSITION:  The patient has been evaluated by PT/OT during this hospitalization.  It is clear that he will require 24-hour supervision, and this cannot be provided in the home environment; therefore, the patient is recommended SNF placement, and at the time of this dictation, arrangements were in place. Provided no acute problems arise overnight and a suitable SNF bed can be found, the patient will be discharged on May 29, 2011.  ACTIVITY:  As tolerated.  Recommended to increase activity slowly.  DIET:  Heart healthy.  FOLLOWUP INSTRUCTIONS:  The patient will follow up routinely with his primary MD, Dr. Merlene Laughter, per prior scheduled appointment.  He will also follow up with his primary neurologists, Dr. Lesia Sago and Dr. Ellison Carwin, per prior scheduled appointment.     Isidor Holts, M.D.     CO/MEDQ  D:  05/28/2011  T:  05/28/2011  Job:  [id number]  cc:   Hal T. Stoneking, M.D. Marlan Palau, M.D. Deanna Artis. Sharene Skeans, M.D.  Electronically Signed by Isidor Holts M.D. on 05/30/2011 09:28:02 AM

## 2011-06-24 NOTE — H&P (Signed)
Barry Dean, Barry Dean               ACCOUNT NO.:  [id number]  MEDICAL RECORD NO.:  [id number]  LOCATION:  [facility]                         FACILITY:  [facility]  PHYSICIAN:  Jonny Ruiz, MD    DATE OF BIRTH:  [birth year]  DATE OF ADMISSION:  05/24/2011 DATE OF DISCHARGE:                             HISTORY & PHYSICAL   PRIMARY CARE PHYSICIAN:  Hal T. Pete Glatter, MD  NEUROLOGISTS: 1. Marlan Palau, MD 2. Deanna Artis. Sharene Skeans, MD  CHIEF COMPLAINT:  Fall.  HISTORY OF PRESENT ILLNESS:  Barry Dean is an 77 year old male with a history of Parkinson disease, essential tremor, seizure disorder (complex partial), who was brought by the to the emergency department because he was found lying next to his bed on the floor this morning by his daughter.  The patient was not able to get up by himself.  He has a history of prior falls and the daughter relates that he fell 1 or 2 weeks ago and bruised the right side of his hip.  The patient had no head trauma.  The patient told EMS that he has some chest pain so he was given aspirin and one sublingual nitro in route to [facility] ED.  A level V caveat applies because the patient is very hard of hitting and cannot provide any history or respond to my questions.  In the emergency department when I saw the patient he was alert, but was not responded to any of my questions.  Most of the history obtained from the daughter as well as ED records and E-chart.  The daughter did not notice any seizure- like activity.  He did not lose consciousness.  No bowel incontinence. However, the patient has had urinary incontinence for quite a while. The patient did not complain of chest pain, shortness of breath, or palpitations at home.  The daughter tells me that he has not been complaining of fever or chills.  He has been eating his usual.  He sleeps a lot.  He used to be a talkative but for the last few years he does not talk much.  She has not noticed any focal  weakness.  No diarrhea.  No hematuria.  PAST MEDICAL HISTORY: 1. Hypertension. 2. Parkinson disease. 3. Benign essential tremor. 4. Seizure disorder, complex partial for the last 2 years. 5. BPH. 6. Incontinent of urine. 7. Deaf, uses hearing aids. 8. Paroxysmal atrial fibrillation. 9. Melanoma. 10.GERD.  SURGICAL HISTORY: 1. Melanoma removal. 2. Iridectomy with lens implant. 3. Appendectomy.  MEDICATIONS:  Aspirin, clonidine, Keppra, Lasix, lorazepam, Lotrel, Toprol-XL, and Vimpat  ALLERGIES:  TOPIRAMATE, UROXATRAL, and VALIUM.  FAMILY HISTORY:  Mother died from a metastatic skin cancer.  Father died at the age of 13 from a stroke.  He has one son with a congenital heart disease, and one brother with diabetes and one daughter with asthma.  He has one sister with Parkinson disease and several family members are affected by essential tremor.  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  He never smoked.  No alcohol abuse.  No drug use.  He became a  widow earlier this year.  He now lives with his daughter.  REVIEW OF SYSTEMS:  See HPI.  PHYSICAL EXAMINATION:  VITAL SIGNS:  Blood pressure 203/97, pulse 103, respirations 20, temperature 98.8, and O2 sat 96%. GENERAL APPEARANCE:  The patient is a large body habitus Caucasian man with a flat affect and expression less stasis.  He looks comfortable. HEENT:  Atraumatic, PERRLA, EOMI.  Nose, nostril normal.  Oral cavity, normal lips, gums and mucosa.  Tongue without lacerations. NECK:  Supple.  No lymphadenopathy or JVD. HEART:  Regular S1-S2 with no gallops, murmurs, or rubs. LUNGS:  Clear to auscultation. ABDOMEN: There is an appendectomy scar on the right lower quadrant, soft, nontender.  No organomegaly or masses palpable. EXTREMITIES:  There is a bruise on the right hip that appears like a week old.  Extremities, he has got 1+ pitting edema  bilaterally at the level of the ankles, he got 1+ pedal pulses. NEUROLOGICAL:  He has intention tremors in both arms and torso.  I did not see any resting tremor.  There is some rigidity on the upper extremities.  Reflexes absent in the patella.  No focal deficits of the upper and lower extremities.  No tongue deviation or loss of nasolabial folds.  Speech unable to assess and gait unable to assess since the patient will fall right back into the stretcher.  DIAGNOSTICS:  EKG normal sinus rhythm at 100 bpm with left atrial hypertrophy, low QRS voltages, no ST or T-wave abnormalities.  No changes when compared to August 08, 2010.  UA negative with a specific gravity 1.007 and pH 8.0.  CT head atrophy and chronic microvascular ischemia.  No acute intracranial abnormality.  CT cervical spine negative for fracture.  Moderate disk degeneration and spondylosis C3- T1.  Right-sided facet degeneration C3-C4, C4-C5.  Levoscoliosis in the lower cervical spine.  Pelvis negative for fracture.  Chest x-ray stable vascular fibrosis.  No active process.  Comprehensive metabolic panel, sodium 132, otherwise entirely normal.  Troponin is negative.  CBC entirely normal and painful.  MEDICAL DECISION MAKING: 1. Frequent falls. 2. Failure to thrive. 3. Complex partial seizures with no recent seizures. 4. Parkinson disease. 5. History of paroxysmal atrial fibrillation. 6. Benign prostatic hypertrophy.  PLAN: 1. Admit the patient to med search.  Saline lock.  Obtain Keppra     levels.  Obtain OT and PT evaluation.  Obtain Child psychotherapist     consultation for placement. 2. Control blood pressure with labetalol 200 p.o. b.i.d.  Continue     amlodipine at 5 mg a day and benazepril 40 mg a day.  Continue     clonidine 0.2 b.i.d..  The patient is a DNR.          ______________________________ Jonny Ruiz, MD     GL/MEDQ  D:  05/24/2011  T:  05/24/2011  Job:  [id number]  cc:   Marlan Palau,  M.D. Hal T. Stoneking, M.D.  Electronically Signed by Jonny Ruiz MD on 06/24/2011 09:43:24 PM

## 2011-07-02 ENCOUNTER — Emergency Department (HOSPITAL_COMMUNITY)
Admission: EM | Admit: 2011-07-02 | Discharge: 2011-07-02 | Disposition: A | Discharge status: Skilled Nursing Facility | Payer: Medicare Other | Attending: Emergency Medicine | Admitting: Emergency Medicine

## 2011-07-02 ENCOUNTER — Encounter: Payer: Self-pay | Admitting: *Deleted

## 2011-07-02 DIAGNOSIS — S40019A Contusion of unspecified shoulder, initial encounter: Secondary | ICD-10-CM | POA: Insufficient documentation

## 2011-07-02 DIAGNOSIS — I4891 Unspecified atrial fibrillation: Secondary | ICD-10-CM | POA: Insufficient documentation

## 2011-07-02 DIAGNOSIS — M25519 Pain in unspecified shoulder: Secondary | ICD-10-CM | POA: Insufficient documentation

## 2011-07-02 DIAGNOSIS — S40011A Contusion of right shoulder, initial encounter: Secondary | ICD-10-CM

## 2011-07-02 DIAGNOSIS — W19XXXA Unspecified fall, initial encounter: Secondary | ICD-10-CM

## 2011-07-02 DIAGNOSIS — K219 Gastro-esophageal reflux disease without esophagitis: Secondary | ICD-10-CM | POA: Insufficient documentation

## 2011-07-02 DIAGNOSIS — I10 Essential (primary) hypertension: Secondary | ICD-10-CM | POA: Insufficient documentation

## 2011-07-02 DIAGNOSIS — F411 Generalized anxiety disorder: Secondary | ICD-10-CM | POA: Insufficient documentation

## 2011-07-02 DIAGNOSIS — R569 Unspecified convulsions: Secondary | ICD-10-CM | POA: Insufficient documentation

## 2011-07-02 DIAGNOSIS — G20 Parkinson's disease: Secondary | ICD-10-CM | POA: Insufficient documentation

## 2011-07-02 DIAGNOSIS — Y9210 Unspecified residential institution as the place of occurrence of the external cause: Secondary | ICD-10-CM | POA: Insufficient documentation

## 2011-07-02 DIAGNOSIS — G20A1 Parkinson's disease without dyskinesia, without mention of fluctuations: Secondary | ICD-10-CM | POA: Insufficient documentation

## 2011-07-02 DIAGNOSIS — W06XXXA Fall from bed, initial encounter: Secondary | ICD-10-CM | POA: Insufficient documentation

## 2011-07-02 HISTORY — DX: Dehydration: E86.0

## 2011-07-02 HISTORY — DX: Gastro-esophageal reflux disease without esophagitis: K21.9

## 2011-07-02 HISTORY — DX: Essential (primary) hypertension: I10

## 2011-07-02 HISTORY — DX: Unspecified convulsions: R56.9

## 2011-07-02 HISTORY — DX: Unspecified hearing loss, unspecified ear: H91.90

## 2011-07-02 HISTORY — DX: Reserved for inherently not codable concepts without codable children: IMO0001

## 2011-07-02 HISTORY — DX: Anxiety disorder, unspecified: F41.9

## 2011-07-02 NOTE — ED Notes (Signed)
Assisted pt with urinal

## 2011-07-02 NOTE — ED Notes (Signed)
Pt on stretcher, nad noted, abc intact, will montior pt while awaiting furthur orders.

## 2011-07-02 NOTE — ED Notes (Signed)
Per EMS:  Pt from a NH, states that the pt fell this am and staff was unable to get pt off of the floor so EMS was called.  No injuries due to the fall.  Pt does report nausea over the past couple of days, has taken zofran. No distress.  Vitals stable.

## 2011-07-02 NOTE — ED Notes (Signed)
Called [employer] and spoke with a med tech who provided information about the pt for me.  Pt fell out of bed tonight with no LOC, hit head on night stand per NH staff.  No injuries, staff were unable to get pt off of floor so EMS was called.

## 2011-07-02 NOTE — ED Provider Notes (Signed)
History     CSN: [id number] Arrival date & time: 07/02/2011  6:33 AM   First MD Initiated Contact with Patient 07/02/11 [phone]      Chief Complaint  Patient presents with  . Fall    (Consider location/radiation/quality/duration/timing/severity/associated sxs/prior treatment) Patient is a 77 y.o. male presenting with fall. The history is provided by the patient, the nursing home and a relative.  Fall The accident occurred 3 to 5 hours ago. Incident: He fell getting out of bed. He usually walks with a walker. He fell from a height of 1 to 2 ft. He landed on a hard floor. The point of impact was the right shoulder. The pain is present in the right shoulder. The pain is mild. He was ambulatory at the scene. Pertinent negatives include no visual change and no tingling. The symptoms are aggravated by activity. He has tried nothing for the symptoms.    Past Medical History  Diagnosis Date  . Hypertension   . Seizures   . A-fib   . Dehydration   . Deafness   . Parkinson's disease   . Anxiety   . Reflux     Past Surgical History  Procedure Date  . Unknown     History reviewed. No pertinent family history.  History  Substance Use Topics  . Smoking status: Not on file  . Smokeless tobacco: Not on file  . Alcohol Use:       Review of Systems  Neurological: Negative for tingling.  All other systems reviewed and are negative.    Allergies  Topamax; Uroxatral; and Valium  Home Medications   Current Outpatient Rx  Name Route Sig Dispense Refill  . ACETAMINOPHEN 500 MG PO TABS Oral Take 500 mg by mouth every 6 (six) hours as needed. For pain     . AMLODIPINE BESY-BENAZEPRIL HCL 10-40 MG PO CAPS Oral Take 1 capsule by mouth daily.      . ASPIRIN 81 MG PO CHEW Oral Chew 81 mg by mouth daily.      Marland Kitchen CLONIDINE HCL 0.2 MG PO TABS Oral Take 0.2 mg by mouth 2 (two) times daily.      Marland Kitchen HYDROCODONE-ACETAMINOPHEN 5-500 MG PO TABS Oral Take 1 tablet by mouth every 6 (six) hours as  needed.      Marland Kitchen LABETALOL HCL 100 MG PO TABS Oral Take 100 mg by mouth 2 (two) times daily.      Marland Kitchen LACOSAMIDE 150 MG PO TABS Oral Take 1 tablet by mouth daily.      Marland Kitchen LEVETIRACETAM 750 MG PO TABS Oral Take 750 mg by mouth 2 (two) times daily.      Marland Kitchen LORAZEPAM 0.5 MG PO TABS Oral Take 0.5 mg by mouth at bedtime as needed. For anxiety     . PANTOPRAZOLE SODIUM 40 MG PO TBEC Oral Take 40 mg by mouth daily.      Marland Kitchen POLYETHYLENE GLYCOL 3350 PO POWD Oral Take 17 g by mouth daily.        BP 146/69  Pulse 62  SpO2 92%  Physical Exam  Constitutional: He is oriented to person, place, and time. He appears well-developed and well-nourished.  HENT:  Head: Normocephalic and atraumatic.  Eyes: EOM are normal. Pupils are equal, round, and reactive to light.  Neck: Normal range of motion. Neck supple.  Cardiovascular: Normal rate.   Pulmonary/Chest: No respiratory distress. He has no wheezes. He has no rales. He exhibits no tenderness.  Abdominal: Soft.  Musculoskeletal:  Normal range of motion.       Small contusion posterior right shoulder. No limitation of range of motion.  Neurological: He is alert and oriented to person, place, and time. No cranial nerve deficit. Coordination normal.       He has bilateral cogwheeling rigidity of the arms and legs .  Skin: Skin is warm and dry.  Psychiatric: He has a normal mood and affect.    ED Course  Procedures (including critical care time)  Abdomen ambulation trial in ED. Patient was able to walk with his walker without problems and was reported to be at his baseline per his daughter.  Diagnosis: Fall with right shoulder contusion   MDM  Fall, suspected due to usual angulation disorder. Physical exam, normal with the exception of a mild contusion of the right shoulder. Patient stable for discharge with outpatient management.        Flint Melter, MD 07/03/11 [phone]

## 2011-07-02 NOTE — ED Notes (Signed)
Ambulated patient in hallway. Pt tolerated well. With 2 assist.

## 2011-07-08 ENCOUNTER — Encounter (HOSPITAL_COMMUNITY): Payer: Self-pay

## 2011-07-08 ENCOUNTER — Emergency Department (HOSPITAL_COMMUNITY)
Admission: EM | Admit: 2011-07-08 | Discharge: 2011-07-08 | Disposition: A | Discharge status: Home / Self Care | Payer: Medicare Other | Attending: Emergency Medicine | Admitting: Emergency Medicine

## 2011-07-08 DIAGNOSIS — R109 Unspecified abdominal pain: Secondary | ICD-10-CM | POA: Insufficient documentation

## 2011-07-08 DIAGNOSIS — W19XXXA Unspecified fall, initial encounter: Secondary | ICD-10-CM | POA: Insufficient documentation

## 2011-07-08 DIAGNOSIS — I4891 Unspecified atrial fibrillation: Secondary | ICD-10-CM | POA: Insufficient documentation

## 2011-07-08 DIAGNOSIS — G20A1 Parkinson's disease without dyskinesia, without mention of fluctuations: Secondary | ICD-10-CM | POA: Insufficient documentation

## 2011-07-08 DIAGNOSIS — Y9210 Unspecified residential institution as the place of occurrence of the external cause: Secondary | ICD-10-CM | POA: Insufficient documentation

## 2011-07-08 DIAGNOSIS — K219 Gastro-esophageal reflux disease without esophagitis: Secondary | ICD-10-CM | POA: Insufficient documentation

## 2011-07-08 DIAGNOSIS — Z79899 Other long term (current) drug therapy: Secondary | ICD-10-CM | POA: Insufficient documentation

## 2011-07-08 DIAGNOSIS — T148XXA Other injury of unspecified body region, initial encounter: Secondary | ICD-10-CM

## 2011-07-08 DIAGNOSIS — I10 Essential (primary) hypertension: Secondary | ICD-10-CM | POA: Insufficient documentation

## 2011-07-08 DIAGNOSIS — G20 Parkinson's disease: Secondary | ICD-10-CM | POA: Insufficient documentation

## 2011-07-08 MED ORDER — BACITRACIN ZINC 500 UNIT/GM EX OINT
TOPICAL_OINTMENT | CUTANEOUS | Status: AC
  Administered 2011-07-08: 10:00:00 via TOPICAL
  Filled 2011-07-08: qty 0.9

## 2011-07-08 NOTE — ED Notes (Signed)
[facility] called  

## 2011-07-08 NOTE — ED Notes (Signed)
[facility] at bedside 

## 2011-07-08 NOTE — ED Notes (Signed)
UXL:[id number]<NU> Expected date:07/08/11<BR> Expected time: 8:24 AM<BR> Means of arrival:Ambulance<BR> Comments:<BR> EMS- fall/abd pain

## 2011-07-08 NOTE — ED Notes (Signed)
Per ems pt from nursing home, pt fell this morning, no loc, no neck or back pain. No hematoma, lac, or abrasion noted, no deformity, takes aspirin. Also c/o abd pain

## 2011-07-08 NOTE — ED Provider Notes (Signed)
History     CSN: [id number] Arrival date & time: 07/08/2011  8:48 AM   First MD Initiated Contact with Patient 07/08/11 [phone]      Chief Complaint  Patient presents with  . Fall  . Abdominal Pain    (Consider location/radiation/quality/duration/timing/severity/associated sxs/prior treatment) Patient is a 77 y.o. male presenting with fall. The history is provided by the patient. The history is limited by the condition of the patient.  Fall Pertinent negatives include no fever, no abdominal pain and no headaches.  pt from [facility], fall this morning. No loc. Abrasion to right wrist. No neck or back pain. No headache. No nv.  ?c/o abd pain earlier, none currently. No nvd. No fever. No seizure. Per [facility] report, rolled from bed.  Pt continues to deny pain or c/o.   Past Medical History  Diagnosis Date  . Hypertension   . Seizures   . A-fib   . Dehydration   . Deafness   . Parkinson's disease   . Anxiety   . Reflux     Past Surgical History  Procedure Date  . Unknown     No family history on file.  History  Substance Use Topics  . Smoking status: Never Smoker   . Smokeless tobacco: Not on file  . Alcohol Use: No      Review of Systems  Constitutional: Negative for fever.  HENT: Negative for neck pain.   Eyes: Negative for pain.  Respiratory: Negative for shortness of breath.   Cardiovascular: Negative for chest pain.  Gastrointestinal: Negative for abdominal pain.  Genitourinary: Negative for flank pain.  Musculoskeletal: Negative for back pain.  Skin: Negative for rash.  Neurological: Negative for headaches.  Hematological: Does not bruise/bleed easily.  Psychiatric/Behavioral: Negative for agitation.    Allergies  Topamax; Uroxatral; and Valium  Home Medications   Current Outpatient Rx  Name Route Sig Dispense Refill  . ACETAMINOPHEN 500 MG PO TABS Oral Take 500 mg by mouth every 6 (six) hours as needed. For pain     . AMLODIPINE BESY-BENAZEPRIL HCL 10-40  MG PO CAPS Oral Take 1 capsule by mouth daily.      . ASPIRIN 81 MG PO CHEW Oral Chew 81 mg by mouth daily.      Marland Kitchen CLONIDINE HCL 0.2 MG PO TABS Oral Take 0.2 mg by mouth 2 (two) times daily.      Marland Kitchen HYDROCODONE-ACETAMINOPHEN 5-500 MG PO TABS Oral Take 1 tablet by mouth every 6 (six) hours as needed.      Marland Kitchen LABETALOL HCL 100 MG PO TABS Oral Take 100 mg by mouth 2 (two) times daily.      Marland Kitchen LACOSAMIDE 150 MG PO TABS Oral Take 1 tablet by mouth daily.      Marland Kitchen LEVETIRACETAM 750 MG PO TABS Oral Take 750 mg by mouth 2 (two) times daily.      Marland Kitchen LORAZEPAM 0.5 MG PO TABS Oral Take 0.5 mg by mouth at bedtime as needed. For anxiety     . PANTOPRAZOLE SODIUM 40 MG PO TBEC Oral Take 40 mg by mouth daily.      Marland Kitchen POLYETHYLENE GLYCOL 3350 PO POWD Oral Take 17 g by mouth daily.        BP 152/78  Pulse 70  Temp(Src) 98.5 F (36.9 C) (Oral)  Resp 12  SpO2 96%  Physical Exam  Nursing note and vitals reviewed. Constitutional: He appears well-developed and well-nourished. No distress.  HENT:  Head: Atraumatic.  Eyes: Pupils  are equal, round, and reactive to light.  Neck: Neck supple. No tracheal deviation present.       ctls spine non tender  Cardiovascular: Normal rate, normal heart sounds and intact distal pulses.   Pulmonary/Chest: Effort normal and breath sounds normal. No accessory muscle usage. No respiratory distress.  Abdominal: Soft. Bowel sounds are normal. He exhibits no distension and no mass. There is no tenderness. There is no rebound and no guarding.  Musculoskeletal: Normal range of motion. He exhibits no edema.       Superficial abrasion right wrist. Good rom bil ext without pain  Neurological: He is alert.       Awake and alert. Very hoh. Moves bil ext purposefully.   Skin: Skin is warm and dry.  Psychiatric: He has a normal mood and affect.    ED Course  Procedures (including critical care time)  Labs Reviewed - No data to display No results found.   No diagnosis  found.    MDM  Reviewed nurses notes. No c/o or pain. Abrasion cleaned, dressing.        Suzi Roots, MD 07/08/11 1001

## 2011-07-08 NOTE — ED Notes (Addendum)
Pt present to ED alert but not communicative. Pt is not talking, but minimal eye contact present. Pt from nursing home,  Was brought here by ems this morning for fall. Pt from [facility]. Assisted living phoned, and the following information was gather. Pt has deafness, has a hearing aids, EMS didn't bring the hearing aids afraid that it will get lost. Pt unable to hear in room, communication has been done through writing. Staff from assisted living found patient on floor this morning, sts pt rolled over the bed and fell to floor, hit his head on the bedside table, no LOC, takes asipirin, skin tear at right arm. Pt verbally told the Nursing home staff that he is in pain and agree to go to hospital

## 2011-07-08 NOTE — ED Notes (Signed)
MD at bedside. 

## 2011-07-17 ENCOUNTER — Emergency Department (HOSPITAL_COMMUNITY): Payer: Medicare Other

## 2011-07-17 ENCOUNTER — Encounter (HOSPITAL_COMMUNITY): Payer: Self-pay | Admitting: *Deleted

## 2011-07-17 ENCOUNTER — Inpatient Hospital Stay (HOSPITAL_COMMUNITY)
Admission: EM | Admit: 2011-07-17 | Discharge: 2011-07-23 | DRG: 194 | Disposition: A | Discharge status: Skilled Nursing Facility | Payer: Medicare Other | Attending: Internal Medicine | Admitting: Internal Medicine

## 2011-07-17 ENCOUNTER — Other Ambulatory Visit: Payer: Self-pay

## 2011-07-17 DIAGNOSIS — G20A1 Parkinson's disease without dyskinesia, without mention of fluctuations: Secondary | ICD-10-CM | POA: Diagnosis present

## 2011-07-17 DIAGNOSIS — H9190 Unspecified hearing loss, unspecified ear: Secondary | ICD-10-CM | POA: Diagnosis present

## 2011-07-17 DIAGNOSIS — R55 Syncope and collapse: Secondary | ICD-10-CM

## 2011-07-17 DIAGNOSIS — I10 Essential (primary) hypertension: Secondary | ICD-10-CM

## 2011-07-17 DIAGNOSIS — Z79899 Other long term (current) drug therapy: Secondary | ICD-10-CM

## 2011-07-17 DIAGNOSIS — G40909 Epilepsy, unspecified, not intractable, without status epilepticus: Secondary | ICD-10-CM | POA: Diagnosis present

## 2011-07-17 DIAGNOSIS — K219 Gastro-esophageal reflux disease without esophagitis: Secondary | ICD-10-CM | POA: Diagnosis present

## 2011-07-17 DIAGNOSIS — G20 Parkinson's disease: Secondary | ICD-10-CM | POA: Diagnosis present

## 2011-07-17 DIAGNOSIS — D509 Iron deficiency anemia, unspecified: Secondary | ICD-10-CM | POA: Diagnosis present

## 2011-07-17 DIAGNOSIS — K449 Diaphragmatic hernia without obstruction or gangrene: Secondary | ICD-10-CM | POA: Diagnosis present

## 2011-07-17 DIAGNOSIS — F341 Dysthymic disorder: Secondary | ICD-10-CM | POA: Diagnosis present

## 2011-07-17 DIAGNOSIS — E871 Hypo-osmolality and hyponatremia: Secondary | ICD-10-CM | POA: Diagnosis present

## 2011-07-17 DIAGNOSIS — Z961 Presence of intraocular lens: Secondary | ICD-10-CM

## 2011-07-17 DIAGNOSIS — Z7982 Long term (current) use of aspirin: Secondary | ICD-10-CM

## 2011-07-17 DIAGNOSIS — J189 Pneumonia, unspecified organism: Secondary | ICD-10-CM

## 2011-07-17 DIAGNOSIS — M129 Arthropathy, unspecified: Secondary | ICD-10-CM | POA: Diagnosis present

## 2011-07-17 DIAGNOSIS — I4891 Unspecified atrial fibrillation: Secondary | ICD-10-CM | POA: Diagnosis present

## 2011-07-17 HISTORY — DX: Depression, unspecified: F32.A

## 2011-07-17 HISTORY — DX: Unspecified osteoarthritis, unspecified site: M19.90

## 2011-07-17 HISTORY — DX: Gastro-esophageal reflux disease without esophagitis: K21.9

## 2011-07-17 HISTORY — DX: Major depressive disorder, single episode, unspecified: F32.9

## 2011-07-17 LAB — COMPREHENSIVE METABOLIC PANEL
ALT: 16 U/L (ref 0–53)
AST: 17 U/L (ref 0–37)
Albumin: 2.9 g/dL — ABNORMAL LOW (ref 3.5–5.2)
Alkaline Phosphatase: 75 U/L (ref 39–117)
CO2: 24 mEq/L (ref 19–32)
Chloride: 96 mEq/L (ref 96–112)
GFR calc non Af Amer: 57 mL/min — ABNORMAL LOW (ref >90)
Potassium: 4.6 mEq/L (ref 3.5–5.1)
Sodium: 127 mEq/L — ABNORMAL LOW (ref 135–145)
Total Bilirubin: 0.4 mg/dL (ref 0.3–1.2)

## 2011-07-17 LAB — URINALYSIS, ROUTINE W REFLEX MICROSCOPIC
Glucose, UA: NEGATIVE mg/dL
Ketones, ur: NEGATIVE mg/dL
Nitrite: NEGATIVE
Specific Gravity, Urine: 1.017 (ref 1.005–1.030)
pH: 6.5 (ref 5.0–8.0)

## 2011-07-17 LAB — DIFFERENTIAL
Basophils Absolute: 0 10*3/uL (ref 0.0–0.1)
Basophils Relative: 0 % (ref 0–1)
Lymphocytes Relative: 35 % (ref 12–46)
Monocytes Absolute: 1.3 10*3/uL — ABNORMAL HIGH (ref 0.1–1.0)
Neutro Abs: 4.1 10*3/uL (ref 1.7–7.7)
Neutrophils Relative %: 49 % (ref 43–77)

## 2011-07-17 LAB — CBC
HCT: 31.6 % — ABNORMAL LOW (ref 39.0–52.0)
MCHC: 34.5 g/dL (ref 30.0–36.0)
RDW: 14.1 % (ref 11.5–15.5)
WBC: 8.5 10*3/uL (ref 4.0–10.5)

## 2011-07-17 LAB — PROCALCITONIN: Procalcitonin: <0.1 ng/mL

## 2011-07-17 MED ORDER — ACETAMINOPHEN 500 MG PO TABS
500.0000 mg | ORAL_TABLET | Freq: Four times a day (QID) | ORAL | Status: DC | PRN
  Filled 2011-07-17: qty 1

## 2011-07-17 MED ORDER — PIPERACILLIN-TAZOBACTAM 3.375 G IVPB
3.3750 g | Freq: Once | INTRAVENOUS | Status: AC
  Administered 2011-07-17: 3.375 g via INTRAVENOUS
  Filled 2011-07-17: qty 50

## 2011-07-17 MED ORDER — VANCOMYCIN HCL IN DEXTROSE 1-5 GM/200ML-% IV SOLN
1000.0000 mg | Freq: Once | INTRAVENOUS | Status: AC
  Administered 2011-07-17: 1000 mg via INTRAVENOUS
  Filled 2011-07-17: qty 200

## 2011-07-17 MED ORDER — PIPERACILLIN-TAZOBACTAM 3.375 G IVPB: 3.3750 g | Freq: Four times a day (QID) | INTRAVENOUS | Status: DC

## 2011-07-17 MED ORDER — HYDROCODONE-ACETAMINOPHEN 5-325 MG PO TABS: 1.0000 | ORAL_TABLET | Freq: Four times a day (QID) | ORAL | Status: DC | PRN

## 2011-07-17 MED ORDER — PIPERACILLIN-TAZOBACTAM 3.375 G IVPB
3.3750 g | Freq: Three times a day (TID) | INTRAVENOUS | Status: DC
  Administered 2011-07-17 – 2011-07-20 (×8): 3.375 g via INTRAVENOUS
  Filled 2011-07-17 (×10): qty 50

## 2011-07-17 MED ORDER — LORAZEPAM 0.5 MG PO TABS: 0.5000 mg | ORAL_TABLET | Freq: Every evening | ORAL | Status: DC | PRN

## 2011-07-17 MED ORDER — LEVETIRACETAM 750 MG PO TABS
750.0000 mg | ORAL_TABLET | Freq: Two times a day (BID) | ORAL | Status: DC
  Administered 2011-07-17 – 2011-07-23 (×12): 750 mg via ORAL
  Filled 2011-07-17 (×14): qty 1

## 2011-07-17 MED ORDER — MELATONIN 5 MG PO TABS: 1.0000 | ORAL_TABLET | Freq: Every evening | ORAL | Status: DC | PRN

## 2011-07-17 MED ORDER — BENAZEPRIL HCL 40 MG PO TABS
40.0000 mg | ORAL_TABLET | Freq: Every day | ORAL | Status: DC
  Administered 2011-07-18 – 2011-07-23 (×6): 40 mg via ORAL
  Filled 2011-07-17 (×6): qty 1

## 2011-07-17 MED ORDER — SODIUM CHLORIDE 0.9 % IV BOLUS (SEPSIS)
1000.0000 mL | Freq: Once | INTRAVENOUS | Status: AC
  Administered 2011-07-17: 1000 mL via INTRAVENOUS

## 2011-07-17 MED ORDER — AMLODIPINE BESYLATE 10 MG PO TABS
10.0000 mg | ORAL_TABLET | Freq: Every day | ORAL | Status: DC
  Administered 2011-07-18 – 2011-07-23 (×6): 10 mg via ORAL
  Filled 2011-07-17 (×6): qty 1

## 2011-07-17 MED ORDER — LABETALOL HCL 100 MG PO TABS
100.0000 mg | ORAL_TABLET | Freq: Two times a day (BID) | ORAL | Status: DC
  Administered 2011-07-17 – 2011-07-23 (×12): 100 mg via ORAL
  Filled 2011-07-17 (×13): qty 1

## 2011-07-17 MED ORDER — CLONIDINE HCL 0.1 MG PO TABS
0.1000 mg | ORAL_TABLET | Freq: Two times a day (BID) | ORAL | Status: DC
  Administered 2011-07-17 – 2011-07-21 (×8): 0.1 mg via ORAL
  Filled 2011-07-17 (×9): qty 1

## 2011-07-17 MED ORDER — VANCOMYCIN HCL 1000 MG IV SOLR
750.0000 mg | Freq: Two times a day (BID) | INTRAVENOUS | Status: DC
  Administered 2011-07-18 – 2011-07-19 (×3): 750 mg via INTRAVENOUS
  Filled 2011-07-17 (×4): qty 750

## 2011-07-17 MED ORDER — AMLODIPINE BESY-BENAZEPRIL HCL 10-40 MG PO CAPS: 1.0000 | ORAL_CAPSULE | Freq: Every day | ORAL | Status: DC

## 2011-07-17 MED ORDER — LACTINEX PO CHEW
1.0000 | CHEWABLE_TABLET | Freq: Three times a day (TID) | ORAL | Status: DC
  Administered 2011-07-18 – 2011-07-23 (×16): 1 via ORAL
  Filled 2011-07-17 (×23): qty 1

## 2011-07-17 MED ORDER — LACOSAMIDE 50 MG PO TABS
150.0000 mg | ORAL_TABLET | Freq: Every day | ORAL | Status: DC
  Administered 2011-07-18 – 2011-07-23 (×6): 150 mg via ORAL
  Filled 2011-07-17 (×3): qty 3
  Filled 2011-07-17: qty 1
  Filled 2011-07-17 (×2): qty 2
  Filled 2011-07-17: qty 3
  Filled 2011-07-17: qty 1
  Filled 2011-07-17: qty 3

## 2011-07-17 MED ORDER — LACOSAMIDE 150 MG PO TABS: 1.0000 | ORAL_TABLET | Freq: Every day | ORAL | Status: DC

## 2011-07-17 MED ORDER — POLYETHYLENE GLYCOL 3350 17 GM/SCOOP PO POWD
17.0000 g | Freq: Every day | ORAL | Status: DC
  Administered 2011-07-17 – 2011-07-23 (×3): 17 g via ORAL
  Filled 2011-07-17 (×4): qty 255

## 2011-07-17 MED ORDER — ASPIRIN 81 MG PO CHEW
81.0000 mg | CHEWABLE_TABLET | Freq: Every evening | ORAL | Status: DC
  Administered 2011-07-17 – 2011-07-22 (×6): 81 mg via ORAL
  Filled 2011-07-17 (×7): qty 1

## 2011-07-17 NOTE — ED Notes (Signed)
Changed pt.'s diaper with Florentina Addison, RN

## 2011-07-17 NOTE — ED Notes (Signed)
Report called to 2000. Family made aware of plan of care. Iv site clear for infiltrate. Pt changed. No skin injuries noted.

## 2011-07-17 NOTE — Progress Notes (Signed)
ANTIBIOTIC CONSULT NOTE - INITIAL  Pharmacy Consult for vancomycin + zosyn Indication: pneumonia  Allergies  Allergen Reactions  . Topamax   . Uroxatral (Alfuzosin Hydrochloride)   . Valium     Patient Measurements: Height: 6\' 2"  (188 cm) Weight: 213 lb 5 oz (96.758 kg) IBW/kg (Calculated) : 82.2   Vital Signs: Temp: 98.2 F (36.8 C) (11/20 1539) Temp src: Oral (11/20 1539) BP: 154/82 mmHg (11/20 1539) Pulse Rate: 86  (11/20 1539) Intake/Output from previous day:   Intake/Output from this shift:    Labs:  Basename 07/17/11 1243  WBC 8.5  HGB 10.9*  PLT 280  LABCREA --  CREATININE 1.13   Estimated Creatinine Clearance: 55.6 ml/min (by C-G formula based on Cr of 1.13). No results found for this basename: VANCOTROUGH:2,VANCOPEAK:2,VANCORANDOM:2,GENTTROUGH:2,GENTPEAK:2,GENTRANDOM:2,TOBRATROUGH:2,TOBRAPEAK:2,TOBRARND:2,AMIKACINPEAK:2,AMIKACINTROU:2,AMIKACIN:2, in the last 72 hours   Microbiology: No results found for this or any previous visit (from the past 720 hour(s)).  Medical History: Past Medical History  Diagnosis Date  . Hypertension   . Seizures   . A-fib   . Dehydration   . Deafness   . Parkinson's disease   . Anxiety   . Reflux   . GERD (gastroesophageal reflux disease)   . Arthritis   . Depression     Medications:  Prescriptions prior to admission  Medication Sig Dispense Refill  . acetaminophen (TYLENOL) 500 MG tablet Take 500 mg by mouth every 6 (six) hours as needed. For pain      . amLODipine-benazepril (LOTREL) 10-40 MG per capsule Take 1 capsule by mouth daily.        Marland Kitchen aspirin 81 MG chewable tablet Chew 81 mg by mouth every evening.       . ciprofloxacin (CIPRO) 250 MG tablet Take 250 mg by mouth 2 (two) times daily.        . cloNIDine (CATAPRES) 0.1 MG tablet Take 0.1 mg by mouth 2 (two) times daily.        Marland Kitchen HYDROcodone-acetaminophen (VICODIN) 5-500 MG per tablet Take 1 tablet by mouth every 6 (six) hours as needed. For pain      .  labetalol (NORMODYNE) 100 MG tablet Take 100 mg by mouth 2 (two) times daily.        . Lacosamide (VIMPAT) 150 MG TABS Take 1 tablet by mouth daily.       Marland Kitchen levETIRAcetam (KEPPRA) 750 MG tablet Take 750 mg by mouth 2 (two) times daily.       Marland Kitchen LORazepam (ATIVAN) 0.5 MG tablet Take 0.5 mg by mouth at bedtime as needed. For anxiety      . Melatonin 5 MG TABS Take 1 tablet by mouth at bedtime as needed.        . polyethylene glycol powder (GLYCOLAX/MIRALAX) powder Take 17 g by mouth daily.        Assessment: Pt admitted from a nursing home after passing out. Chest x-ray reported with possible infiltrates. To start empiric vancomycin + zosyn for pneumonia. He received 1gm vancomycin and 3.375gm IV zosyn in the ED.   Goal of Therapy:  Vancomycin trough level 15-20 mcg/ml  Plan:  Measure antibiotic drug levels at steady state Follow up culture results Vancomycin 750mg  IV Q12H Zosyn 3.375gm IV Q8H (4 hour infusion)  Alize Acy, Drake Leach 07/17/2011,6:17 PM

## 2011-07-17 NOTE — ED Provider Notes (Signed)
History     CSN: [id number] Arrival date & time: 07/17/2011 12:37 PM   First MD Initiated Contact with Patient 07/17/11 1236      No chief complaint on file.   (Consider location/radiation/quality/duration/timing/severity/associated sxs/prior treatment) Patient is a 77 y.o. male presenting with syncope. The history is provided by the EMS personnel and the patient.  Loss of Consciousness This is a new problem. The current episode started today. The problem occurs intermittently. The problem has been resolved. Associated symptoms include a fever and urinary symptoms. Pertinent negatives include no abdominal pain, chest pain, coughing, nausea, vertigo, visual change or vomiting. The symptoms are aggravated by walking. Treatments tried: IVF. The treatment provided mild relief.  EMS reports that pt was hypotensive upon arrival, improved  Past Medical History  Diagnosis Date  . Hypertension   . Seizures   . A-fib   . Dehydration   . Deafness   . Parkinson's disease   . Anxiety   . Reflux     Past Surgical History  Procedure Date  . Unknown     No family history on file.  History  Substance Use Topics  . Smoking status: Never Smoker   . Smokeless tobacco: Not on file  . Alcohol Use: No      Review of Systems  Constitutional: Positive for fever.  Respiratory: Negative for cough.   Cardiovascular: Positive for syncope. Negative for chest pain.  Gastrointestinal: Negative for nausea, vomiting and abdominal pain.  Neurological: Negative for vertigo.  All other systems reviewed and are negative.    Allergies  Topamax; Uroxatral; and Valium  Home Medications   Current Outpatient Rx  Name Route Sig Dispense Refill  . ACETAMINOPHEN 500 MG PO TABS Oral Take 500 mg by mouth every 6 (six) hours as needed. For pain     . AMLODIPINE BESY-BENAZEPRIL HCL 10-40 MG PO CAPS Oral Take 1 capsule by mouth daily.      . ASPIRIN 81 MG PO CHEW Oral Chew 81 mg by mouth daily.      Marland Kitchen  CLONIDINE HCL 0.2 MG PO TABS Oral Take 0.2 mg by mouth 2 (two) times daily.      Marland Kitchen HYDROCODONE-ACETAMINOPHEN 5-500 MG PO TABS Oral Take 1 tablet by mouth every 6 (six) hours as needed.      Marland Kitchen LABETALOL HCL 100 MG PO TABS Oral Take 100 mg by mouth 2 (two) times daily.      Marland Kitchen LACOSAMIDE 150 MG PO TABS Oral Take 1 tablet by mouth daily.      Marland Kitchen LEVETIRACETAM 750 MG PO TABS Oral Take 750 mg by mouth 2 (two) times daily.      Marland Kitchen LORAZEPAM 0.5 MG PO TABS Oral Take 0.5 mg by mouth at bedtime as needed. For anxiety     . PANTOPRAZOLE SODIUM 40 MG PO TBEC Oral Take 40 mg by mouth daily.      Marland Kitchen POLYETHYLENE GLYCOL 3350 PO POWD Oral Take 17 g by mouth daily.        BP 110/52  Pulse 75  Temp 97.9 F (36.6 C)  Resp 17  SpO2 96%  Physical Exam  Nursing note and vitals reviewed. Constitutional: He is oriented to person, place, and time. He appears well-developed and well-nourished. No distress.  HENT:  Head: Normocephalic and atraumatic.  Eyes: Conjunctivae are normal. Pupils are equal, round, and reactive to light.  Neck: Normal range of motion.  Cardiovascular: Normal rate, regular rhythm and normal heart sounds.  Pulmonary/Chest: Effort normal and breath sounds normal. No respiratory distress. He exhibits no tenderness.  Abdominal: Soft. There is no tenderness. There is no rebound and no guarding.  Musculoskeletal: Normal range of motion. He exhibits no edema and no tenderness.  Neurological: He is alert and oriented to person, place, and time. No cranial nerve deficit.  Skin: Skin is warm and dry. No erythema.    ED Course  Procedures (including critical care time)  Labs Reviewed  CBC - Abnormal; Notable for the following:    RBC 3.50 (*)    Hemoglobin 10.9 (*)    HCT 31.6 (*)    All other components within normal limits  DIFFERENTIAL - Abnormal; Notable for the following:    Monocytes Relative 15 (*)    Monocytes Absolute 1.3 (*)    All other components within normal limits    COMPREHENSIVE METABOLIC PANEL - Abnormal; Notable for the following:    Sodium 127 (*)    Glucose, Bld 109 (*)    Total Protein 5.6 (*)    Albumin 2.9 (*)    GFR calc non Af Amer 57 (*)    GFR calc Af Amer 66 (*)    All other components within normal limits  LACTIC ACID, PLASMA  PROCALCITONIN  URINALYSIS, ROUTINE W REFLEX MICROSCOPIC  URINE CULTURE  I-STAT TROPONIN I  CULTURE, BLOOD (ROUTINE X 2)  CULTURE, BLOOD (ROUTINE X 2)   Dg Chest Portable 1 View  07/17/2011  *RADIOLOGY REPORT*  Clinical Data: Syncope.  Hypertension.  PORTABLE CHEST - 1 VIEW  Comparison: 05/24/2011.  Findings: There is mild worsening aeration of the lung bases - particularly the left lung base.  This consistent with atelectasis/infiltrate superimposed on chronic fibrotic changes. There is mild cardiomegaly.  The central pulmonary arteries are prominent but unchanged.  IMPRESSION: Worsening bibasilar aeration (the tip of the left base) consistent with atelectasis/infiltrate superimposed on chronic fibrotic change.  Original Report Authenticated By: Rolla Plate, M.D.     No diagnosis found.    Date: 07/17/2011  Rate: 67  Rhythm: normal sinus rhythm  QRS Axis: normal  Intervals: normal  ST/T Wave abnormalities: normal  Conduction Disutrbances:none  Narrative Interpretation:   Old EKG Reviewed: unchanged   MDM  PT presented due to syncope today at nursing facility.  He is well appearing on exam>  Fever reported by NH facility.  Labs ordered.  Pt also with Low O2 sat of 89% does not wear O2 at home.  Labs ordered.  CXR showed likely infiltrate.  Given vanc/zosyn.  Consulted [facility] hospitalist for further care.          Nena Alexander, MD 07/17/11 [phone]

## 2011-07-17 NOTE — ED Notes (Signed)
Patient presents to ed states he was getting up to go to the bathroom and had a syncopal episode. Patient was found on the floor by staff at nursing home. C/o nausea and abd. Pain onset last pm. Last BM 3 days ago of which is normal for him. Patient recently finished Cipro for UTI>

## 2011-07-17 NOTE — ED Provider Notes (Signed)
I saw and evaluated the patient, reviewed the resident's note and I agree with the findings and plan. Patient with HAP.  Patient treated with zosyn and vancomycin.   Hilario Quarry, MD 07/17/11 [phone]

## 2011-07-17 NOTE — H&P (Signed)
Hospital Admission Note Date: 07/17/2011  Patient name: Barry Dean Medical record number: [id number] Date of birth: 1924/03/21 Age: 77 y.o. Gender: male PCP: No primary provider on file.  Chief Complaint: Passed out  History of Present Illness: The bowel history comes from the ED provider. This is a nursing home resident who had a witnessed syncopal episode today. By report the patient also had a temperature of 101 earlier today. The patient denies any cough shortness of breath he denies feeling bad in any way no chest pain.  Meds: Prescriptions prior to admission  Medication Sig Dispense Refill  . acetaminophen (TYLENOL) 500 MG tablet Take 500 mg by mouth every 6 (six) hours as needed. For pain      . amLODipine-benazepril (LOTREL) 10-40 MG per capsule Take 1 capsule by mouth daily.        Marland Kitchen aspirin 81 MG chewable tablet Chew 81 mg by mouth every evening.       . ciprofloxacin (CIPRO) 250 MG tablet Take 250 mg by mouth 2 (two) times daily.        . cloNIDine (CATAPRES) 0.1 MG tablet Take 0.1 mg by mouth 2 (two) times daily.        Marland Kitchen HYDROcodone-acetaminophen (VICODIN) 5-500 MG per tablet Take 1 tablet by mouth every 6 (six) hours as needed. For pain      . labetalol (NORMODYNE) 100 MG tablet Take 100 mg by mouth 2 (two) times daily.        . Lacosamide (VIMPAT) 150 MG TABS Take 1 tablet by mouth daily.       Marland Kitchen levETIRAcetam (KEPPRA) 750 MG tablet Take 750 mg by mouth 2 (two) times daily.       Marland Kitchen LORazepam (ATIVAN) 0.5 MG tablet Take 0.5 mg by mouth at bedtime as needed. For anxiety      . Melatonin 5 MG TABS Take 1 tablet by mouth at bedtime as needed.        . polyethylene glycol powder (GLYCOLAX/MIRALAX) powder Take 17 g by mouth daily.        Allergies: Topamax; Uroxatral; and Valium Past Medical History  Diagnosis Date  . Hypertension   . Seizures   . A-fib   . Dehydration   . Deafness   . Parkinson's disease   . Anxiety   . Reflux   . GERD (gastroesophageal reflux  disease)   . Arthritis   . Depression    Past Surgical History  Procedure Date  . Unknown   . Appendectomy   . Cataract extraction w/ intraocular lens  implant, bilateral    History reviewed. No pertinent family history. History   Social History  . Marital Status: Married    Spouse Name: N/A    Number of Children: N/A  . Years of Education: N/A   Occupational History  . Not on file.   Social History Main Topics  . Smoking status: Never Smoker   . Smokeless tobacco: Never Used  . Alcohol Use: No  . Drug Use: No  . Sexually Active: No   Other Topics Concern  . Not on file   Social History Narrative  . No narrative on file   Review of Systems  Unable to perform ROS  Filed Vitals:   07/17/11 1539  BP: 154/82  Pulse: 86  Temp: 98.2 F (36.8 C)  Resp: 18      Physical Exam  Vitals reviewed. Constitutional: He appears well-developed and well-nourished. No distress.  HENT:  Head:  Normocephalic and atraumatic.  Nose: Nose normal.  Mouth/Throat: No oropharyngeal exudate.       Bilateral hearing aids  Eyes: Conjunctivae and EOM are normal. Pupils are equal, round, and reactive to light. Right eye exhibits no discharge. Left eye exhibits no discharge. No scleral icterus.  Neck: Normal range of motion. Neck supple. No JVD present. No tracheal deviation present. No thyromegaly present.  Cardiovascular: Normal rate and intact distal pulses.  Exam reveals no gallop and no friction rub.   Murmur heard. Pulmonary/Chest: Effort normal. No stridor. No respiratory distress. He has no wheezes. He has no rales. He exhibits no tenderness.       Diminished bilaterally  Abdominal: Soft. Bowel sounds are normal. He exhibits distension. There is no tenderness. There is no rebound and no guarding.  Musculoskeletal: Normal range of motion. He exhibits no edema and no tenderness.  Lymphadenopathy:    He has no cervical adenopathy.  Neurological: He is alert. No cranial nerve  deficit. He exhibits normal muscle tone. Coordination normal.  Skin: Skin is warm and dry. No rash noted. He is not diaphoretic. No erythema. No pallor.  Psychiatric: He has a normal mood and affect. His behavior is normal. Thought content normal.    Lab results: Results for orders placed during the hospital encounter of 07/17/11  CBC      Component Value Range   WBC 8.5  4.0 - 10.5 (K/uL)   RBC 3.50 (*) 4.22 - 5.81 (MIL/uL)   Hemoglobin 10.9 (*) 13.0 - 17.0 (g/dL)   HCT 96.0 (*) 45.4 - 52.0 (%)   MCV 90.3  78.0 - 100.0 (fL)   MCH 31.1  26.0 - 34.0 (pg)   MCHC 34.5  30.0 - 36.0 (g/dL)   RDW 09.8  11.9 - 14.7 (%)   Platelets 280  150 - 400 (K/uL)  DIFFERENTIAL      Component Value Range   Neutrophils Relative 49  43 - 77 (%)   Neutro Abs 4.1  1.7 - 7.7 (K/uL)   Lymphocytes Relative 35  12 - 46 (%)   Lymphs Abs 3.0  0.7 - 4.0 (K/uL)   Monocytes Relative 15 (*) 3 - 12 (%)   Monocytes Absolute 1.3 (*) 0.1 - 1.0 (K/uL)   Eosinophils Relative 1  0 - 5 (%)   Eosinophils Absolute 0.1  0.0 - 0.7 (K/uL)   Basophils Relative 0  0 - 1 (%)   Basophils Absolute 0.0  0.0 - 0.1 (K/uL)  COMPREHENSIVE METABOLIC PANEL      Component Value Range   Sodium 127 (*) 135 - 145 (mEq/L)   Potassium 4.6  3.5 - 5.1 (mEq/L)   Chloride 96  96 - 112 (mEq/L)   CO2 24  19 - 32 (mEq/L)   Glucose, Bld 109 (*) 70 - 99 (mg/dL)   BUN 17  6 - 23 (mg/dL)   Creatinine, Ser 8.29  0.50 - 1.35 (mg/dL)   Calcium 8.5  8.4 - 56.2 (mg/dL)   Total Protein 5.6 (*) 6.0 - 8.3 (g/dL)   Albumin 2.9 (*) 3.5 - 5.2 (g/dL)   AST 17  0 - 37 (U/L)   ALT 16  0 - 53 (U/L)   Alkaline Phosphatase 75  39 - 117 (U/L)   Total Bilirubin 0.4  0.3 - 1.2 (mg/dL)   GFR calc non Af Amer 57 (*) >90 (mL/min)   GFR calc Af Amer 66 (*) >90 (mL/min)  LACTIC ACID, PLASMA  Component Value Range   Lactic Acid, Venous 1.5  0.5 - 2.2 (mmol/L)  PROCALCITONIN      Component Value Range   Procalcitonin <0.10    URINALYSIS, ROUTINE W REFLEX  MICROSCOPIC      Component Value Range   Color, Urine YELLOW  YELLOW    Appearance CLEAR  CLEAR    Specific Gravity, Urine 1.017  1.005 - 1.030    pH 6.5  5.0 - 8.0    Glucose, UA NEGATIVE  NEGATIVE (mg/dL)   Hgb urine dipstick NEGATIVE  NEGATIVE    Bilirubin Urine SMALL (*) NEGATIVE    Ketones, ur NEGATIVE  NEGATIVE (mg/dL)   Protein, ur NEGATIVE  NEGATIVE (mg/dL)   Urobilinogen, UA 1.0  0.0 - 1.0 (mg/dL)   Nitrite NEGATIVE  NEGATIVE    Leukocytes, UA NEGATIVE  NEGATIVE   POCT I-STAT TROPONIN I      Component Value Range   Troponin i, poc 0.01  0.00 - 0.08 (ng/mL)   Comment 3             Imaging results:  Dg Chest Portable 1 View  07/17/2011  *RADIOLOGY REPORT*  Clinical Data: Syncope.  Hypertension.  PORTABLE CHEST - 1 VIEW  Comparison: 05/24/2011.  Findings: There is mild worsening aeration of the lung bases - particularly the left lung base.  This consistent with atelectasis/infiltrate superimposed on chronic fibrotic changes. There is mild cardiomegaly.  The central pulmonary arteries are prominent but unchanged.  IMPRESSION: Worsening bibasilar aeration (the tip of the left base) consistent with atelectasis/infiltrate superimposed on chronic fibrotic change.  Original Report Authenticated By: Rolla Plate, M.D.   Other results:  Assessment & Plan by Problem:  1. Pneumonia this patient is a nursing home resident and was hospitalized 2 months ago so he will be treated for institution acquired pneumonia and he will be placed on vancomycin and Zosyn. He currently is afebrile his white count is normal his blood pressure is stable his O2 sats are fine and he has not having any respiratory symptoms.   #5.  Hyponatremia.  IV fluids and will follow.  #2.Syncope.  This witnessed syncopal episode was in the setting of a fever which is likely related to his pneumonia.  He will be monitored on telemetry for 24 hours but no further workup is planned.  #3 seizure disorder. We will  continue his outpatient seizure medications. The chance of course that the syncopal episode earlier was a seizure related.    #4 Parkinson's disease we will continue his outpatient medications.  We will continue all of the patients outpatient medications.    Greater than 70 minutes was spent on direct patient care and coordination of care.  Please fax this note to primary provider  Kesler Wickham 07/17/2011, 4:35 PM

## 2011-07-18 DIAGNOSIS — E871 Hypo-osmolality and hyponatremia: Secondary | ICD-10-CM | POA: Diagnosis present

## 2011-07-18 DIAGNOSIS — R55 Syncope and collapse: Secondary | ICD-10-CM | POA: Diagnosis present

## 2011-07-18 DIAGNOSIS — G40909 Epilepsy, unspecified, not intractable, without status epilepticus: Secondary | ICD-10-CM | POA: Diagnosis present

## 2011-07-18 DIAGNOSIS — I10 Essential (primary) hypertension: Secondary | ICD-10-CM | POA: Diagnosis present

## 2011-07-18 DIAGNOSIS — J189 Pneumonia, unspecified organism: Secondary | ICD-10-CM | POA: Diagnosis present

## 2011-07-18 LAB — BASIC METABOLIC PANEL
BUN: 11 mg/dL (ref 6–23)
CO2: 23 mEq/L (ref 19–32)
Calcium: 8.4 mg/dL (ref 8.4–10.5)
Chloride: 98 mEq/L (ref 96–112)
Creatinine, Ser: 0.71 mg/dL (ref 0.50–1.35)
GFR calc Af Amer: >90 mL/min (ref >90)
GFR calc non Af Amer: 83 mL/min — ABNORMAL LOW (ref >90)
Glucose, Bld: 101 mg/dL — ABNORMAL HIGH (ref 70–99)
Potassium: 4.4 mEq/L (ref 3.5–5.1)
Sodium: 128 mEq/L — ABNORMAL LOW (ref 135–145)

## 2011-07-18 LAB — CBC
HCT: 32.3 % — ABNORMAL LOW (ref 39.0–52.0)
Hemoglobin: 11.2 g/dL — ABNORMAL LOW (ref 13.0–17.0)
MCH: 31.1 pg (ref 26.0–34.0)
MCHC: 34.7 g/dL (ref 30.0–36.0)
MCV: 89.7 fL (ref 78.0–100.0)
Platelets: 295 10*3/uL (ref 150–400)
RBC: 3.6 MIL/uL — ABNORMAL LOW (ref 4.22–5.81)
RDW: 13.7 % (ref 11.5–15.5)
WBC: 6.1 10*3/uL (ref 4.0–10.5)

## 2011-07-18 LAB — URINALYSIS, ROUTINE W REFLEX MICROSCOPIC
Bilirubin Urine: NEGATIVE
Glucose, UA: NEGATIVE mg/dL
Ketones, ur: NEGATIVE mg/dL
pH: 7 (ref 5.0–8.0)

## 2011-07-18 LAB — URINE CULTURE
Colony Count: NO GROWTH
Culture  Setup Time: 201211201355

## 2011-07-18 LAB — OSMOLALITY: Osmolality: 266 mOsm/kg — ABNORMAL LOW (ref 275–300)

## 2011-07-18 NOTE — Progress Notes (Signed)
UR review completed. 

## 2011-07-18 NOTE — Progress Notes (Signed)
07/18/11 16:07 Letha Cape RN, BSN [phone] Patient is from [facility], CSW referral.  NCM will continue to follow for dc needs.

## 2011-07-18 NOTE — Progress Notes (Signed)
Clinical Social Work, 07/18/2011:  Please see shadow chart for full assessment.  Patient is from [facility].  CSW will continue to follow to assist with discharge plans.Barry Dean, [place], [place], #[phone]

## 2011-07-18 NOTE — Progress Notes (Signed)
Barry Dean is a 77 y.o. male patient admitted on 07/17/2011. I reviewed his chart and saw him at bedside in the presence of his daughter. He was admitted after a " syncopal episode at ALF". However, evaluation in ED suggested pneumonia. He has not had an arrhythmias on telemetry.  SUBJECTIVE Say he is OK, but he is rather apathetic. Per daughter, his appetite has been poor and he has been more withdrawn lately.   1. Syncope   2. Pneumonia     Past Medical History  Diagnosis Date  . Hypertension   . Seizures   . A-fib   . Dehydration   . Deafness   . Parkinson's disease   . Anxiety   . Reflux   . GERD (gastroesophageal reflux disease)   . Arthritis   . Depression    Current Facility-Administered Medications  Medication Dose Route Frequency Provider Last Rate Last Dose  . acetaminophen (TYLENOL) tablet 500 mg  500 mg Oral Q6H PRN [name], MD      . amLODipine (NORVASC) tablet 10 mg  10 mg Oral Daily [name], PHARMD   10 mg at 07/18/11 1009  . aspirin chewable tablet 81 mg  81 mg Oral QPM [name], MD   81 mg at 07/17/11 1849  . benazepril (LOTENSIN) tablet 40 mg  40 mg Oral Daily [name], PHARMD   40 mg at 07/18/11 1009  . cloNIDine (CATAPRES) tablet 0.1 mg  0.1 mg Oral BID [name], MD   0.1 mg at 07/18/11 1009  . HYDROcodone-acetaminophen (NORCO) 5-325 MG per tablet 1 tablet  1 tablet Oral Q6H PRN [name], MD      . labetalol (NORMODYNE) tablet 100 mg  100 mg Oral BID [name], MD   100 mg at 07/18/11 1009  . lacosamide (VIMPAT) tablet 150 mg  150 mg Oral Daily [name], PHARMD      . lactobacillus acidophilus & bulgar (LACTINEX) chewable tablet 1 tablet  1 tablet Oral TID WC [name], MD   1 tablet at 07/18/11 0856  . levETIRAcetam (KEPPRA) tablet 750 mg  750 mg Oral BID [name], MD   750 mg at 07/18/11 1008  . LORazepam (ATIVAN) tablet 0.5 mg  0.5 mg Oral QHS PRN [name], MD      . piperacillin-tazobactam (ZOSYN) IVPB 3.375 g  3.375 g Intravenous Once Nena Alexander, MD   3.375 g at 07/17/11 1405  . piperacillin-tazobactam (ZOSYN) IVPB 3.375 g  3.375 g Intravenous Q8H [name], PHARMD   3.375 g at 07/18/11 0610  . polyethylene glycol powder (GLYCOLAX/MIRALAX) container 17 g  17 g Oral Daily [name], MD   17 g at 07/17/11 1851  . sodium chloride 0.9 % bolus 1,000 mL  1,000 mL Intravenous Once Nena Alexander, MD   1,000 mL at 07/17/11 1328  . vancomycin (VANCOCIN) 750 mg in sodium chloride 0.9 % 150 mL IVPB  750 mg Intravenous Q12H [name], PHARMD   750 mg at 07/18/11 0210  . vancomycin (VANCOCIN) IVPB 1000 mg/200 mL premix  1,000 mg Intravenous Once Nena Alexander, MD   1,000 mg at 07/17/11 1447  . DISCONTD: amLODipine-benazepril (LOTREL) 10-40 MG per capsule 1 capsule  1 capsule Oral Daily [name], MD      . DISCONTD: Lacosamide TABS 150 mg  1 tablet Oral Daily [name], MD      . DISCONTD: Melatonin TABS 5  mg  1 tablet Oral QHS PRN [name], MD      . DISCONTD: piperacillin-tazobactam (ZOSYN) IVPB 3.375 g  3.375 g Intravenous Q6H [name], MD       Allergies  Allergen Reactions  . Topamax   . Uroxatral (Alfuzosin Hydrochloride)   . Valium    Active Problems:  * No active hospital problems. *    Vital signs in last 24 hours: Temp:  [97.9 F (36.6 C)-98.9 F (37.2 C)] 98.2 F (36.8 C) (11/21 0450) Pulse Rate:  [65-91] 65  (11/21 0450) Resp:  [15-19] 19  (11/21 0450) BP: (110-159)/(52-88) 159/77 mmHg (11/21 0450) SpO2:  [92 %-97 %] 95 % (11/21 0450) Weight:  [96.758 kg (213 lb 5 oz)-213.5 kg (470 lb 10.9 oz)] 213 lb 5 oz (96.758 kg) (11/20 1813) Weight change:  Last BM Date: 07/15/11  Intake/Output from previous day: 11/20 0701 - 11/21 0700 In: 720 [P.O.:720] Out: 1350 [Urine:1350] Intake/Output this shift: Total I/O In: 120 [P.O.:120] Out: 1800  [Urine:1800]  Lab Results:  [facility] 07/18/11 0630 07/17/11 1243  WBC 6.1 8.5  HGB 11.2* 10.9*  HCT 32.3* 31.6*  PLT 295 280   BMET  Basename 07/18/11 0630 07/17/11 1243  NA 128* 127*  K 4.4 4.6  CL 98 96  CO2 23 24  GLUCOSE 101* 109*  BUN 11 17  CREATININE 0.71 1.13  CALCIUM 8.4 8.5    Studies/Results: Dg Chest Portable 1 View  07/17/2011  *RADIOLOGY REPORT*  Clinical Data: Syncope.  Hypertension.  PORTABLE CHEST - 1 VIEW  Comparison: 05/24/2011.  Findings: There is mild worsening aeration of the lung bases - particularly the left lung base.  This consistent with atelectasis/infiltrate superimposed on chronic fibrotic changes. There is mild cardiomegaly.  The central pulmonary arteries are prominent but unchanged.  IMPRESSION: Worsening bibasilar aeration (the tip of the left base) consistent with atelectasis/infiltrate superimposed on chronic fibrotic change.  Original Report Authenticated By: Rolla Plate, M.D.    Medications: I have reviewed the patient's current medications.   Physical exam GENERAL- alert HEAD- normal atraumatic, no neck masses, normal thyroid, no jvd RESPIRATORY- chest clear, no wheezing, crepitations, rhonchi, normal symmetric air entry CVS- regular rate and rhythm, S1, S2 normal, no murmur, click, rub or gallop ABDOMEN- abdomen is soft without significant tenderness, masses, organomegaly or guarding NEURO- Grossly normal EXTREMITIES- extremities normal, atraumatic, no cyanosis or edema  Plan 1. HCAP- clinically better, afebrile, oxygenating adequately. Continue vanc/zosyn to complete 7 days. 2. Hyponatremia- ?SIADH. ?meds Check serum osmolality/urine osmolality/bnp/tsh/cortisol level/ct chest with contrast. 3. Seizure disorder- stable on regular meds. 4. Parkinson disease- rather apathetic- continue home meds. PT evaluation 5. DVT prophylaxis. 6. SNF vs ALF- ambulate, PT evaluation.    Floyd Wade 07/18/2011 11:16 AM Pager:  [phone].

## 2011-07-18 NOTE — Progress Notes (Signed)
INITIAL ADULT NUTRITION ASSESSMENT Date: 07/18/2011   Time: 9:34 AM  Reason for Assessment: MD consult  ASSESSMENT: Male 77 y.o.  Dx: PNA  Hx:  Past Medical History  Diagnosis Date  . Hypertension   . Seizures   . A-fib   . Dehydration   . Deafness   . Parkinson's disease   . Anxiety   . Reflux   . GERD (gastroesophageal reflux disease)   . Arthritis   . Depression    Related Meds:     . amLODipine  10 mg Oral Daily  . aspirin  81 mg Oral QPM  . benazepril  40 mg Oral Daily  . cloNIDine  0.1 mg Oral BID  . labetalol  100 mg Oral BID  . lacosamide  150 mg Oral Daily  . lactobacillus acidophilus & bulgar  1 tablet Oral TID WC  . levETIRAcetam  750 mg Oral BID  . piperacillin-tazobactam  3.375 g Intravenous Once  . piperacillin-tazobactam (ZOSYN)  IV  3.375 g Intravenous Q8H  . polyethylene glycol powder  17 g Oral Daily  . sodium chloride  1,000 mL Intravenous Once  . vancomycin  750 mg Intravenous Q12H  . vancomycin  1,000 mg Intravenous Once  . DISCONTD: amLODipine-benazepril  1 capsule Oral Daily  . DISCONTD: Lacosamide  1 tablet Oral Daily  . DISCONTD: piperacillin-tazobactam (ZOSYN)  IV  3.375 g Intravenous Q6H   Ht: 6\' 2"  (188 cm)  Wt: 213 lb 5 oz (96.758 kg)  Ideal Wt: 86.4 kg % Ideal Wt: 112%  Usual Wt:  % Usual Wt:   Body mass index is 27.39 kg/(m^2).  Food/Nutrition Related Hx: from nursing home PTA  Labs:  BMET    Component Value Date/Time   NA 128* 07/18/2011 0630   K 4.4 07/18/2011 0630   CL 98 07/18/2011 0630   CO2 23 07/18/2011 0630   GLUCOSE 101* 07/18/2011 0630   BUN 11 07/18/2011 0630   CREATININE 0.71 07/18/2011 0630   CALCIUM 8.4 07/18/2011 0630   GFRNONAA 83* 07/18/2011 0630   GFRAA >90 07/18/2011 0630    I/O last 3 completed shifts: In: 720 [P.O.:720] Out: 1350 [Urine:1350] Total I/O In: 120 [P.O.:120] Out: 1800 [Urine:1800]   Diet Order: Cardiac  Supplements/Tube Feeding: none at this time  IVF:  none  Estimated Nutritional Needs:   Kcal: 1800-2000 Protein: 95-110 g Fluid: 2 - 2.2 L/d  Patient from Nursing Home. Currently on Heart Healthy diet, eating 75-100%. Family member at bedside denies any wt loss and doesn't suspect issues with swallowing.  NUTRITION DIAGNOSIS: No nutrition dx at this time  MONITORING/EVALUATION(Goals): Goal: Pt to consume >75% of meals. Met. Monitor: weights, labs, PO intake  EDUCATION NEEDS: -No education needs identified at this time  INTERVENTION: 1. RD consulted for dysphagia; if suspect trouble with swallowing, consult SLP to determine safest texture and liquid consistency 2. Pt eating well at this time, no interventions at this time.  Dietitian #:[id number]  DOCUMENTATION CODES Per approved criteria  -Not Applicable    Adair Laundry 07/18/2011, 9:34 AM

## 2011-07-18 NOTE — Plan of Care (Signed)
Problem: Phase I Progression Outcomes Goal: OOB as tolerated unless otherwise ordered Outcome: Progressing PT evaluation completed.  Feel patient will benefit from SNF with therapy prior to returning to Assisted living.  Continue PT.  Thanks. [facility] Acute Rehabilitation [phone] (pager)

## 2011-07-18 NOTE — Progress Notes (Signed)
Physical Therapy Evaluation Patient Details Name: Barry Dean MRN: [id number] DOB: 1924-05-14 Today's Date: 07/18/2011  Problem List:  Patient Active Problem List  Diagnoses  . HCAP (healthcare-associated pneumonia)  . Seizure disorder  . HTN (hypertension)  . Hyponatremia  . Syncope    Past Medical History:  Past Medical History  Diagnosis Date  . Hypertension   . Seizures   . A-fib   . Dehydration   . Deafness   . Parkinson's disease   . Anxiety   . Reflux   . GERD (gastroesophageal reflux disease)   . Arthritis   . Depression    Past Surgical History:  Past Surgical History  Procedure Date  . Unknown   . Appendectomy   . Cataract extraction w/ intraocular lens  implant, bilateral     PT Assessment/Plan/Recommendation PT Assessment Clinical Impression Statement: Patient is s/p PNA with decreased mobility secondary to decreased balance and safety awareness.  Patient was at an Assisted living prior to admit.  Would benefit from PT in SNF with therapy to improve to baseline status.  Patient and daughter are in agreement re: therapy at [facility].  PT Recommendation/Assessment: Patient will need skilled PT in the acute care venue PT Problem List: Decreased activity tolerance;Decreased balance;Decreased mobility;Decreased cognition;Decreased knowledge of use of DME;Decreased safety awareness;Cardiopulmonary status limiting activity PT Therapy Diagnosis : Difficulty walking PT Plan PT Frequency: Min 3X/week PT Treatment/Interventions: DME instruction;Gait training;Functional mobility training;Therapeutic exercise;Balance training;Patient/family education;Cognitive remediation PT Recommendation Follow Up Recommendations: Skilled nursing facility;24 hour supervision/assistance Equipment Recommended: Defer to next venue PT Goals  Acute Rehab PT Goals PT Goal Formulation: With patient Time For Goal Achievement: 2 weeks Pt will go Supine/Side to Sit: with supervision;with  rail PT Goal: Supine/Side to Sit - Progress: Other (comment) Pt will Transfer Sit to Stand/Stand to Sit: with supervision;with upper extremity assist PT Transfer Goal: Sit to Stand/Stand to Sit - Progress: Other (comment) Pt will Ambulate: >150 feet;with supervision;with least restrictive assistive device PT Goal: Ambulate - Progress: Other (comment)  PT Evaluation Precautions/Restrictions  Precautions Precautions: Fall Prior Functioning  Home Living Lives With: Alone Receives Help From: Personal care attendant Type of Home: Assisted living [facility]) Home Access: Level entry Bathroom Shower/Tub: Walk-in shower Bathroom Toilet: Handicapped height Bathroom Accessibility: Yes How Accessible: Accessible via walker Home Adaptive Equipment: Walker - rolling;Grab bars around toilet;Built-in shower seat;Wheelchair - manual;Hand-held shower hose;Grab bars in shower (Adjustable bed, Lift chair) Prior Function Level of Independence: Independent with transfers;Independent with gait;Requires assistive device for independence;Needs assistance with ADLs Bath: Minimal Toileting: Minimal Dressing: Minimal Grooming: Minimal Feeding: Supervision/set-up Driving: No Vocation: Retired Producer, television/film/video: Awake/alert Overall Cognitive Status: History of cognitive impairments History of Cognitive Impairment: Appears at baseline functioning Orientation Level: Oriented to person;Oriented to place;Disoriented to time;Disoriented to situation Cognition - Other Comments: PRoblems with problem solving and safety awareness and ? memory deficits.   Sensation/Coordination Sensation Light Touch: Appears Intact Stereognosis: Not tested Hot/Cold: Not tested Proprioception: Not tested Coordination Gross Motor Movements are Fluid and Coordinated: Yes Fine Motor Movements are Fluid and Coordinated: No (Has Parkinsons tremor) Extremity Assessment RUE Assessment RUE Assessment:  Within Functional Limits LUE Assessment LUE Assessment: Within Functional Limits RLE Assessment RLE Assessment: Within Functional Limits LLE Assessment LLE Assessment: Within Functional Limits Mobility (including Balance) Bed Mobility Bed Mobility: No Transfers Transfers: Yes Sit to Stand: 3: Mod assist;From chair/3-in-1;With upper extremity assist;With armrests Sit to Stand Details (indicate cue type and reason): Cues for hand and foot placement with difficulty  from low recliner Stand to Sit: 2: Max assist;With upper extremity assist;With armrests;To chair/3-in-1 Stand to Sit Details: max assist and max cues to control descent to low chair Ambulation/Gait Ambulation/Gait: Yes Ambulation/Gait Assistance: 4: Min assist Ambulation/Gait Assistance Details (indicate cue type and reason): Pt. needed cues and asssit to stay close to RW.  Needed cues occasionally to slow down.  Trunk flexed slightly. Ambulation Distance (Feet): 250 Feet Assistive device: Rolling walker Gait Pattern: Step-through pattern;Shuffle;Ataxic;Trunk flexed;Decreased stride length Gait velocity: quickened cadence but causes pt. to be unstable at times. Stairs: No Wheelchair Mobility Wheelchair Mobility: No  Posture/Postural Control Posture/Postural Control: Postural limitations Postural Limitations: Patient is slightly flexed at trunk and neck flexed significantly at rest. Balance Balance Assessed: Yes Dynamic Standing Balance Dynamic Standing - Balance Support: Bilateral upper extremity supported;During functional activity Dynamic Standing - Level of Assistance: 4: Min assist (cannot accept challenges) Exercise    End of Session PT - End of Session Equipment Utilized During Treatment: Gait belt Activity Tolerance: Patient limited by fatigue (Toward end of walk, pt. fatigued and rushed to get back. ) Patient left: in chair Nurse Communication: Mobility status for ambulation General Behavior During Session:  [facility] for tasks performed Cognition: Impaired, at baseline  INGOLD,Adreona Brand 07/18/2011, 4:43 PM [name] Acute Rehabilitation [phone] (pager)

## 2011-07-19 ENCOUNTER — Inpatient Hospital Stay (HOSPITAL_COMMUNITY): Payer: Medicare Other

## 2011-07-19 LAB — CBC
HCT: 33.2 % — ABNORMAL LOW (ref 39.0–52.0)
Hemoglobin: 11.6 g/dL — ABNORMAL LOW (ref 13.0–17.0)
MCH: 31.5 pg (ref 26.0–34.0)
MCHC: 34.9 g/dL (ref 30.0–36.0)
MCV: 90.2 fL (ref 78.0–100.0)
Platelets: 317 10*3/uL (ref 150–400)
RBC: 3.68 MIL/uL — ABNORMAL LOW (ref 4.22–5.81)
RDW: 13.8 % (ref 11.5–15.5)
WBC: 6.2 10*3/uL (ref 4.0–10.5)

## 2011-07-19 LAB — BASIC METABOLIC PANEL
BUN: 11 mg/dL (ref 6–23)
CO2: 26 mEq/L (ref 19–32)
Calcium: 8.7 mg/dL (ref 8.4–10.5)
Chloride: 96 mEq/L (ref 96–112)
Creatinine, Ser: 0.76 mg/dL (ref 0.50–1.35)
GFR calc Af Amer: >90 mL/min (ref >90)
GFR calc non Af Amer: 81 mL/min — ABNORMAL LOW (ref >90)
Glucose, Bld: 99 mg/dL (ref 70–99)
Potassium: 4.2 mEq/L (ref 3.5–5.1)
Sodium: 129 mEq/L — ABNORMAL LOW (ref 135–145)

## 2011-07-19 LAB — HEPATIC FUNCTION PANEL
ALT: 19 U/L (ref 0–53)
AST: 19 U/L (ref 0–37)
Albumin: 2.8 g/dL — ABNORMAL LOW (ref 3.5–5.2)
Alkaline Phosphatase: 73 U/L (ref 39–117)
Bilirubin, Direct: 0.1 mg/dL (ref 0.0–0.3)
Indirect Bilirubin: 0.3 mg/dL (ref 0.3–0.9)
Total Bilirubin: 0.4 mg/dL (ref 0.3–1.2)
Total Protein: 5.6 g/dL — ABNORMAL LOW (ref 6.0–8.3)

## 2011-07-19 LAB — CEA: CEA: <0.5 ng/mL (ref 0.0–5.0)

## 2011-07-19 LAB — OSMOLALITY, URINE: Osmolality, Ur: 446 mOsm/kg (ref 390–1090)

## 2011-07-19 MED ORDER — IOHEXOL 300 MG/ML  SOLN
80.0000 mL | Freq: Once | INTRAMUSCULAR | Status: AC | PRN
  Administered 2011-07-19: 80 mL via INTRAVENOUS

## 2011-07-19 MED ORDER — PANTOPRAZOLE SODIUM 40 MG PO TBEC
40.0000 mg | DELAYED_RELEASE_TABLET | Freq: Two times a day (BID) | ORAL | Status: DC
  Administered 2011-07-19 – 2011-07-23 (×9): 40 mg via ORAL
  Filled 2011-07-19 (×9): qty 1

## 2011-07-19 NOTE — Progress Notes (Signed)
CT chest showed no mass but bilaterally basilar infiltrates/small pleural effusions/large hiatal hernia. SUBJECTIVE Feels OK. Denies any complaints.   1. Syncope   2. Pneumonia     Past Medical History  Diagnosis Date  . Hypertension   . Seizures   . A-fib   . Dehydration   . Deafness   . Parkinson's disease   . Anxiety   . Reflux   . GERD (gastroesophageal reflux disease)   . Arthritis   . Depression    Current Facility-Administered Medications  Medication Dose Route Frequency Provider Last Rate Last Dose  . acetaminophen (TYLENOL) tablet 500 mg  500 mg Oral Q6H PRN [name], MD      . amLODipine (NORVASC) tablet 10 mg  10 mg Oral Daily [name], PHARMD   10 mg at 07/19/11 1015  . aspirin chewable tablet 81 mg  81 mg Oral QPM [name], MD   81 mg at 07/18/11 1641  . benazepril (LOTENSIN) tablet 40 mg  40 mg Oral Daily [name], PHARMD   40 mg at 07/19/11 1015  . cloNIDine (CATAPRES) tablet 0.1 mg  0.1 mg Oral BID [name], MD   0.1 mg at 07/19/11 1015  . HYDROcodone-acetaminophen (NORCO) 5-325 MG per tablet 1 tablet  1 tablet Oral Q6H PRN [name], MD      . iohexol (OMNIPAQUE) 300 MG/ML injection 80 mL  80 mL Intravenous Once PRN Medication Radiologist   80 mL at 07/19/11 0056  . labetalol (NORMODYNE) tablet 100 mg  100 mg Oral BID [name], MD   100 mg at 07/19/11 1015  . lacosamide (VIMPAT) tablet 150 mg  150 mg Oral Daily [name], PHARMD   150 mg at 07/18/11 1155  . lactobacillus acidophilus & bulgar (LACTINEX) chewable tablet 1 tablet  1 tablet Oral TID WC [name], MD   1 tablet at 07/18/11 1641  . levETIRAcetam (KEPPRA) tablet 750 mg  750 mg Oral BID [name], MD   750 mg at 07/19/11 1015  . LORazepam (ATIVAN) tablet 0.5 mg  0.5 mg Oral QHS PRN [name], MD      . pantoprazole (PROTONIX) EC tablet 40 mg  40 mg Oral BID AC Nykolas Bacallao      . piperacillin-tazobactam  (ZOSYN) IVPB 3.375 g  3.375 g Intravenous Q8H [name], PHARMD   3.375 g at 07/19/11 1013  . polyethylene glycol powder (GLYCOLAX/MIRALAX) container 17 g  17 g Oral Daily [name], MD   17 g at 07/18/11 1155  . DISCONTD: vancomycin (VANCOCIN) 750 mg in sodium chloride 0.9 % 150 mL IVPB  750 mg Intravenous Q12H [name], PHARMD   750 mg at 07/19/11 0544   Allergies  Allergen Reactions  . Topamax   . Uroxatral (Alfuzosin Hydrochloride)   . Valium    Active Problems:  HCAP (healthcare-associated pneumonia)  Hyponatremia  Syncope  Seizure disorder  HTN (hypertension)   Vital signs in last 24 hours: Temp:  [98 F (36.7 C)-98.7 F (37.1 C)] 98.4 F (36.9 C) (11/22 0331) Pulse Rate:  [67-95] 67  (11/22 0331) Resp:  [20] 20  (11/22 0331) BP: (127-174)/(65-75) 127/71 mmHg (11/22 0331) SpO2:  [92 %-93 %] 92 % (11/22 0331) Weight:  [94.9 kg (209 lb 3.5 oz)] 209 lb 3.5 oz (94.9 kg) (11/22 0300) Weight change: -118.6 kg (-261 lb 7.5 oz) Last BM Date: 07/17/11  Intake/Output from previous day: 11/21 0701 - 11/22 0700 In:  1130 [P.O.:1080; IV Piggyback:50] Out: 4050 [Urine:4050] Intake/Output this shift: Total I/O In: -  Out: 1 [Stool:1]  Lab Results:  Basename 07/19/11 0630 07/18/11 0630  WBC 6.2 6.1  HGB 11.6* 11.2*  HCT 33.2* 32.3*  PLT 317 295   BMET  Basename 07/19/11 0630 07/18/11 0630  NA 129* 128*  K 4.2 4.4  CL 96 98  CO2 26 23  GLUCOSE 99 101*  BUN 11 11  CREATININE 0.76 0.71  CALCIUM 8.7 8.4    Studies/Results: Ct Chest W Contrast  07/19/2011  *RADIOLOGY REPORT*  Clinical Data: Hyponatremia, pneumonia.  Evaluate for malignancy.  CT CHEST WITH CONTRAST  Technique:  Multidetector CT imaging of the chest was performed following the standard protocol during bolus administration of intravenous contrast.  Contrast: 80mL OMNIPAQUE IOHEXOL 300 MG/ML IV SOLN  Comparison: 07/17/2011 radiograph, 08/09/2010 CT  Findings: The aorta is  of normal caliber with mild tortuosity. Scattered atherosclerotic calcification.  Cardiomegaly.  No pericardial effusion.  Aortic and mitral valve calcification. Coronary artery calcification.  The main pulmonary arterial branches are patent.  The study is not optimized to evaluate for pulmonary embolism of the lumbar and more peripheral branches.  No intrathoracic lymphadenopathy.  There is circumferential distal esophageal wall thickening and a large hiatal hernia.  Small bilateral pleural effusions.  Bibasilar opacities, some of which is of high attenuation. Superimposed on areas of chronic/fibrotic change.  No acute osseous abnormality.  IMPRESSION: Bibasilar opacities are concerning for aspiration pneumonia. Associated small pleural effusions.  Circumferential distal esophageal wall thickening likely represents GERD or esophagitis.  An underlying mass lesion is not excluded by CT, which would require direct inspection/EGD.  Large hiatal hernia.  Original Report Authenticated By: [name], M.D.   Dg Chest Portable 1 View  07/17/2011  *RADIOLOGY REPORT*  Clinical Data: Syncope.  Hypertension.  PORTABLE CHEST - 1 VIEW  Comparison: 05/24/2011.  Findings: There is mild worsening aeration of the lung bases - particularly the left lung base.  This consistent with atelectasis/infiltrate superimposed on chronic fibrotic changes. There is mild cardiomegaly.  The central pulmonary arteries are prominent but unchanged.  IMPRESSION: Worsening bibasilar aeration (the tip of the left base) consistent with atelectasis/infiltrate superimposed on chronic fibrotic change.  Original Report Authenticated By: Rolla Plate, M.D.    Medications: I have reviewed the patient's current medications.   Physical exam GENERAL- alert and well HEAD- normal atraumatic, no neck masses, normal thyroid, no jvd RESPIRATORY- chest clear, no wheezing, crepitations, rhonchi, normal symmetric air entry CVS- regular rate and  rhythm, S1, S2 normal, no murmur, click, rub or gallop ABDOMEN- abdomen is soft without significant tenderness, masses, organomegaly or guarding NEURO- Grossly normal EXTREMITIES- extremities normal, atraumatic, no cyanosis or edema  Plan 1. HCAP- clinically better, afebrile, oxygenating adequately. Possibility of aspiration pneumonitis. Descalate Abx to Zosyn only for now. Speech therapy evaluation. 2. Hyponatremia-element of dehydration. Improving slowly. 3. Seizure disorder- stable on regular meds.  4. Parkinson disease- more with it today. PT evaluation appreciated. 5. Hiatal hernia/esophagitis- add ppi. Check cea. 5. DVT prophylaxis.  6. For SNF if family agreeable.       Barry Dean 07/19/2011 11:13 AM Pager: [phone].

## 2011-07-19 NOTE — Progress Notes (Signed)
Chaplain's Note:  Pt was watching tv and reading paper.  After I introduced myself as a chaplain, pt told me he was watching tv.  He did not wish to engage in conversation.  Will continue to follow if needed.  [employer]  [phone]

## 2011-07-20 ENCOUNTER — Inpatient Hospital Stay (HOSPITAL_COMMUNITY): Payer: Medicare Other

## 2011-07-20 LAB — CBC
HCT: 32.3 % — ABNORMAL LOW (ref 39.0–52.0)
Hemoglobin: 11 g/dL — ABNORMAL LOW (ref 13.0–17.0)
MCH: 30.5 pg (ref 26.0–34.0)
MCHC: 34.1 g/dL (ref 30.0–36.0)
MCV: 89.5 fL (ref 78.0–100.0)
Platelets: 315 10*3/uL (ref 150–400)
RBC: 3.61 MIL/uL — ABNORMAL LOW (ref 4.22–5.81)
RDW: 13.8 % (ref 11.5–15.5)
WBC: 6.3 10*3/uL (ref 4.0–10.5)

## 2011-07-20 LAB — BASIC METABOLIC PANEL
BUN: 10 mg/dL (ref 6–23)
CO2: 25 mEq/L (ref 19–32)
Calcium: 8.6 mg/dL (ref 8.4–10.5)
Chloride: 94 mEq/L — ABNORMAL LOW (ref 96–112)
Creatinine, Ser: 0.76 mg/dL (ref 0.50–1.35)
GFR calc Af Amer: >90 mL/min (ref >90)
GFR calc non Af Amer: 81 mL/min — ABNORMAL LOW (ref >90)
Glucose, Bld: 94 mg/dL (ref 70–99)
Potassium: 4 mEq/L (ref 3.5–5.1)
Sodium: 127 mEq/L — ABNORMAL LOW (ref 135–145)

## 2011-07-20 MED ORDER — AMOXICILLIN-POT CLAVULANATE 875-125 MG PO TABS
1.0000 | ORAL_TABLET | Freq: Two times a day (BID) | ORAL | Status: DC
  Administered 2011-07-20 – 2011-07-23 (×7): 1 via ORAL
  Filled 2011-07-20 (×8): qty 1

## 2011-07-20 MED ORDER — ENOXAPARIN SODIUM 40 MG/0.4ML ~~LOC~~ SOLN
40.0000 mg | SUBCUTANEOUS | Status: DC
  Administered 2011-07-20: 40 mg via SUBCUTANEOUS
  Filled 2011-07-20 (×2): qty 0.4

## 2011-07-20 NOTE — Procedures (Signed)
Modified Barium Swallow Procedure Note Patient Details  Name: Barry Dean MRN: [id number] Date of Birth: 27-Feb-1924  Today's Date: 07/20/2011 Time: 2130-8657 Time Calculation (min): 27 min  Past Medical History:  Past Medical History  Diagnosis Date  . Hypertension   . Seizures   . A-fib   . Dehydration   . Deafness   . Parkinson's disease   . Anxiety   . Reflux   . GERD (gastroesophageal reflux disease)   . Arthritis   . Depression    Past Surgical History:  Past Surgical History  Procedure Date  . Unknown   . Appendectomy   . Cataract extraction w/ intraocular lens  implant, bilateral    HPI:     Recommendation/Prognosis  Clinical Impression Dysphagia Diagnosis: Mild pharyngeal phase dysphagia Recommendations Recommended Consults: MBS General Recommendation:  (Diet recommendations pending MBS) Solid Consistency: Regular Liquid Consistency: Thin Liquid Administration via: Cup;Straw Medication Administration: Whole meds with liquid Supervision: Patient able to self feed Compensations: Slow rate;Small sips/bites Postural Changes and/or Swallow Maneuvers: Seated upright 90 degrees Oral Care Recommendations: Oral care BID;Patient independent with oral care Prognosis Prognosis for Safe Diet Advancement: Good Individuals Consulted Consulted and Agree with Results and Recommendations: Patient  SLP Assessment/Plan Clinical Impression Statement: No overt s/s aspiration observed or reported.  Pt reports intermittent globus sensation.  CXR raised suspicion for aspiration pneumonia.  Objective study warranted.  SLP Goals     General:  Date of Onset: 07/20/11 Type of Study: Initial MBS Diet Prior to this Study: Regular;Thin liquids Temperature Spikes Noted: No Respiratory Status: Room air History of Intubation: No Behavior/Cognition: Alert;Cooperative;Pleasant mood Oral Cavity - Dentition: Dentures, bottom;Dentures, top Oral Motor / Sensory Function: Within  functional limits Vision: Functional for self-feeding Patient Positioning: Upright in chair Baseline Vocal Quality: Normal Volitional Cough: Strong Volitional Swallow: Able to elicit Anatomy: Within functional limits Pharyngeal Secretions: Not observed secondary MBS Ice chips: Not tested  Reason for Referral:  BSE revealed no overt s/s aspiration, however, given hx Parkinsons and suspicion of aspiration pna on CXR, objective study warranted.  Oral Phase Oral Preparation/Oral Phase Oral Phase: WFL Pharyngeal Phase  Pharyngeal Phase Pharyngeal Phase: Impaired Pharyngeal - Thin Pharyngeal - Thin Cup: Premature spillage to pyriform;Reduced airway/laryngeal closure;Penetration/Aspiration during swallow Penetration/Aspiration details (thin cup): Material enters airway, remains ABOVE vocal cords then ejected out (intermittent trace flash penetration) Pharyngeal - Thin Straw: Premature spillage to pyriform;Reduced airway/laryngeal closure;Penetration/Aspiration during swallow Penetration/Aspiration details (thin straw): Material enters airway, remains ABOVE vocal cords then ejected out (intermittent trace flash penetration) Pharyngeal - Solids Pharyngeal - Puree: Premature spillage to valleculae Pharyngeal Phase - Comment Pharyngeal Comment: Delayed swallow reflex, with trigger at pyriform on thin liquids, vallecula on puree.  Trace flash penetration of thin liquids noted intermittently Cervical Esophageal Phase  Cervical Esophageal Phase Cervical Esophageal Phase: [name]     [name], MSP, CCC-SLP [phone] 07/20/2011, 11:13 AM

## 2011-07-20 NOTE — Progress Notes (Signed)
SUBJECTIVE Feels ok. No complaints.   1. Syncope   2. Pneumonia     Past Medical History  Diagnosis Date  . Hypertension   . Seizures   . A-fib   . Dehydration   . Deafness   . Parkinson's disease   . Anxiety   . Reflux   . GERD (gastroesophageal reflux disease)   . Arthritis   . Depression    Current Facility-Administered Medications  Medication Dose Route Frequency Provider Last Rate Last Dose  . acetaminophen (TYLENOL) tablet 500 mg  500 mg Oral Q6H PRN [name], MD      . amLODipine (NORVASC) tablet 10 mg  10 mg Oral Daily [name], PHARMD   10 mg at 07/19/11 1015  . amoxicillin-clavulanate (AUGMENTIN) 875-125 MG per tablet 1 tablet  1 tablet Oral Q12H [name]      . aspirin chewable tablet 81 mg  81 mg Oral QPM [name], MD   81 mg at 07/19/11 1627  . benazepril (LOTENSIN) tablet 40 mg  40 mg Oral Daily [name], PHARMD   40 mg at 07/19/11 1015  . cloNIDine (CATAPRES) tablet 0.1 mg  0.1 mg Oral BID [name], MD   0.1 mg at 07/19/11 2111  . enoxaparin (LOVENOX) injection 40 mg  40 mg Subcutaneous Q24H [name]      . HYDROcodone-acetaminophen (NORCO) 5-325 MG per tablet 1 tablet  1 tablet Oral Q6H PRN [name], MD      . labetalol (NORMODYNE) tablet 100 mg  100 mg Oral BID [name], MD   100 mg at 07/19/11 2110  . lacosamide (VIMPAT) tablet 150 mg  150 mg Oral Daily [name], PHARMD   150 mg at 07/19/11 1245  . lactobacillus acidophilus & bulgar (LACTINEX) chewable tablet 1 tablet  1 tablet Oral TID WC [name], MD   1 tablet at 07/20/11 [phone]  . levETIRAcetam (KEPPRA) tablet 750 mg  750 mg Oral BID [name], MD   750 mg at 07/19/11 2110  . LORazepam (ATIVAN) tablet 0.5 mg  0.5 mg Oral QHS PRN [name], MD      . pantoprazole (PROTONIX) EC tablet 40 mg  40 mg Oral BID AC [name]   40 mg at 07/20/11 9604  . piperacillin-tazobactam (ZOSYN) IVPB 3.375 g  3.375 g  Intravenous Q8H [name], PHARMD   3.375 g at 07/20/11 0518  . polyethylene glycol powder (GLYCOLAX/MIRALAX) container 17 g  17 g Oral Daily [name], MD   17 g at 07/18/11 1155  . DISCONTD: vancomycin (VANCOCIN) 750 mg in sodium chloride 0.9 % 150 mL IVPB  750 mg Intravenous Q12H [name], PHARMD   750 mg at 07/19/11 0544   Allergies  Allergen Reactions  . Topamax   . Uroxatral (Alfuzosin Hydrochloride)   . Valium    Active Problems:  HCAP (healthcare-associated pneumonia)  Hyponatremia  Syncope  Seizure disorder  HTN (hypertension)   Vital signs in last 24 hours: Temp:  [98.2 F (36.8 C)-98.3 F (36.8 C)] 98.2 F (36.8 C) (11/23 0520) Pulse Rate:  [69-91] 91  (11/23 0520) Resp:  [19-22] 20  (11/23 0520) BP: (108-171)/(52-73) 108/60 mmHg (11/23 0520) SpO2:  [90 %-94 %] 94 % (11/23 0520) Weight change:  Last BM Date: 07/19/11  Intake/Output from previous day: 11/22 0701 - 11/23 0700 In: 340 [P.O.:340] Out: 401 [Urine:400; Stool:1] Intake/Output this shift:    Lab Results:  [name] 07/20/11  0600 07/19/11 0630  WBC 6.3 6.2  HGB 11.0* 11.6*  HCT 32.3* 33.2*  PLT 315 317   BMET  Basename 07/20/11 0600 07/19/11 0630  NA 127* 129*  K 4.0 4.2  CL 94* 96  CO2 25 26  GLUCOSE 94 99  BUN 10 11  CREATININE 0.76 0.76  CALCIUM 8.6 8.7    Studies/Results: Ct Chest W Contrast  07/19/2011  *RADIOLOGY REPORT*  Clinical Data: Hyponatremia, pneumonia.  Evaluate for malignancy.  CT CHEST WITH CONTRAST  Technique:  Multidetector CT imaging of the chest was performed following the standard protocol during bolus administration of intravenous contrast.  Contrast: 80mL OMNIPAQUE IOHEXOL 300 MG/ML IV SOLN  Comparison: 07/17/2011 radiograph, 08/09/2010 CT  Findings: The aorta is of normal caliber with mild tortuosity. Scattered atherosclerotic calcification.  Cardiomegaly.  No pericardial effusion.  Aortic and mitral valve calcification. Coronary  artery calcification.  The main pulmonary arterial branches are patent.  The study is not optimized to evaluate for pulmonary embolism of the lumbar and more peripheral branches.  No intrathoracic lymphadenopathy.  There is circumferential distal esophageal wall thickening and a large hiatal hernia.  Small bilateral pleural effusions.  Bibasilar opacities, some of which is of high attenuation. Superimposed on areas of chronic/fibrotic change.  No acute osseous abnormality.  IMPRESSION: Bibasilar opacities are concerning for aspiration pneumonia. Associated small pleural effusions.  Circumferential distal esophageal wall thickening likely represents GERD or esophagitis.  An underlying mass lesion is not excluded by CT, which would require direct inspection/EGD.  Large hiatal hernia.  Original Report Authenticated By: [name], M.D.    Medications: I have reviewed the patient's current medications.   Physical exam GENERAL- alert and well, rather apathetic. HEAD- normal atraumatic, no neck masses, normal thyroid, no jvd RESPIRATORY- chest clear, no wheezing, crepitations, rhonchi, normal symmetric air entry CVS- regular rate and rhythm, S1, S2 normal, no murmur, click, rub or gallop ABDOMEN- abdomen is soft without significant tenderness, masses, organomegaly or guarding NEURO- Grossly normal EXTREMITIES- extremities normal, atraumatic, no cyanosis or edema   Plan  1. HCAP/Aspiration PNA- clinically better, afebrile, oxygenating adequately. Change antibiotics to augmentin. Speech therapy evaluation.  2. Hyponatremia-element of dehydration. Improving slowly.  3. Seizure disorder- stable on regular meds.  4. Parkinson disease- more with it today. PT evaluation appreciated.  5. Hiatal hernia/esophagitis- on ppi. follow cea.  5. DVT prophylaxis.  6. For SNF if family agreeable.     Barry Dean 07/20/2011 8:38 AM Pager: [phone].

## 2011-07-20 NOTE — Progress Notes (Signed)
Speech Language Pathology Bedside Swallow Evaluation Patient Details  Name: Barry Dean MRN: [id number] DOB: 10-16-1923 Today's Date: 07/20/2011  Past Medical History:  Past Medical History  Diagnosis Date  . Hypertension   . Seizures   . A-fib   . Dehydration   . Deafness   . Parkinson's disease   . Anxiety   . Reflux   . GERD (gastroesophageal reflux disease)   . Arthritis   . Depression    Past Surgical History:  Past Surgical History  Procedure Date  . Unknown   . Appendectomy   . Cataract extraction w/ intraocular lens  implant, bilateral     Assessment/Recommendations/Treatment Plan    SLP Assessment Clinical Impression Statement: No overt s/s aspiration observed or reported.  Pt reports intermittent globus sensation.  CXR raised suspicion for aspiration pneumonia.  Objective study warranted. Risk for Aspiration: Mild Other Related Risk Factors: History of pneumonia;History of GERD  Recommendations Recommended Consults: MBS General Recommendation:  (Diet recommendations pending MBS) Solid Consistency:  (pending MBS) Liquid Consistency:  (pending MBS) Medication Administration: Whole meds with liquid Supervision: Patient able to self feed (additional recs pending MBS) Oral Care Recommendations: Oral care BID  Prognosis Prognosis for Safe Diet Advancement: Good  Individuals Consulted Consulted and Agree with Results and Recommendations: Patient;Family member/caregiver;RN  Swallow Study Goals  SLP Swallowing Goals Patient will consume recommended diet without observed clinical signs of aspiration with: Independent assistance Swallow Study Goal #1 - Progress: Progressing toward goal Patient will utilize recommended strategies during swallow to increase swallowing safety with: Independent assistance Swallow Study Goal #2 - Progress: Progressing toward goal  Swallow Study Prior Functional Status     General  Date of Onset: 07/17/11 Type of Study:  Bedside swallow evaluation Diet Prior to this Study: Regular;Thin liquids Temperature Spikes Noted: No Respiratory Status: Room air History of Intubation: No Behavior/Cognition: Alert;Cooperative;Pleasant mood Oral Cavity - Dentition: Dentures, top;Dentures, bottom Vision: Functional for self-feeding Patient Positioning: Upright in chair Baseline Vocal Quality: Normal Volitional Cough: Strong Volitional Swallow: Able to elicit Ice chips: Not tested  Oral Motor/Sensory Function  Labial ROM: Within Functional Limits Labial Symmetry: Within Functional Limits Labial Strength: Within Functional Limits Labial Sensation: Within Functional Limits Lingual ROM: Within Functional Limits Lingual Symmetry: Within Functional Limits Lingual Strength: Within Functional Limits Lingual Sensation: Within Functional Limits Facial ROM: Within Functional Limits Facial Symmetry: Within Functional Limits Facial Strength: Within Functional Limits Facial Sensation: Within Functional Limits Velum: Within Functional Limits Mandible: Within Functional Limits  Consistency Results  Ice Chips Ice chips: Not tested  Thin Liquid Thin Liquid: Within functional limits Presentation: Cup;Spoon;Self Fed  Nectar Thick Liquid Nectar Thick Liquid: Not tested  Honey Thick Liquid Honey Thick Liquid: Not tested  Puree Puree: Within functional limits Presentation: Self Fed;Spoon  Solid Solid: Within functional limits Presentation: Self Fed   [name], MSP, CCC-SLP [phone] 07/20/2011,10:08 AM

## 2011-07-20 NOTE — Progress Notes (Signed)
Physical Therapy Treatment Patient Details Name: Barry Dean MRN: [id number] DOB: 06/29/1924 Today's Date: 07/20/2011  PT Assessment/Plan  PT - Assessment/Plan Comments on Treatment Session: Patient is s/p PNA with decreased independence with gait secondary to decreased endurance and decreased balance.   PT Plan: Discharge plan remains appropriate;Frequency remains appropriate PT Frequency: Min 3X/week Follow Up Recommendations: Skilled nursing facility;24 hour supervision/assistance Equipment Recommended: Defer to next venue PT Goals  Acute Rehab PT Goals PT Goal: Supine/Side to Sit - Progress: Other (comment) PT Transfer Goal: Sit to Stand/Stand to Sit - Progress: Progressing toward goal PT Goal: Ambulate - Progress: Progressing toward goal  PT Treatment Precautions/Restrictions  Precautions Precautions: Fall Required Braces or Orthoses: No Restrictions Weight Bearing Restrictions: No Mobility (including Balance) Bed Mobility Bed Mobility: No Transfers Sit to Stand: 4: Min assist;With upper extremity assist;With armrests;From chair/3-in-1 Sit to Stand Details (indicate cue type and reason): did better getting up from low recliner. Stand to Sit: 3: Mod assist;With armrests;With upper extremity assist;To chair/3-in-1 Stand to Sit Details: still needed assist and cues to control descent into chair but did slightly better than last visit. Ambulation/Gait Ambulation/Gait Assistance: 4: Min assist Ambulation/Gait Assistance Details (indicate cue type and reason): Pt. still needed min cues and assist to stay close to RW. Needed cues to slow down as well.  Continues with slightly flexed trunk as well.  Occasionally, pt. bumping into objects with RW on the left.   Ambulation Distance (Feet): 350 Feet Assistive device: Rolling walker Gait Pattern: Step-through pattern;Shuffle;Ataxic;Antalgic;Trunk flexed;Decreased stride length Gait velocity: Continues with quickened cadence which  causes pt. to be unsteady. Stairs: No Wheelchair Mobility Wheelchair Mobility: No  Posture/Postural Control Posture/Postural Control: Postural limitations Postural Limitations: Flexed trunk and neck flexed significantly at rest. Balance Balance Assessed: No Dynamic Gait Index Level Surface: Moderate Impairment Change in Gait Speed: Moderate Impairment Gait with Horizontal Head Turns: Mild Impairment Gait with Vertical Head Turns: Mild Impairment Gait and Pivot Turn: Mild Impairment Step Over Obstacle: Severe Impairment Step Around Obstacles: Mild Impairment Steps: Severe Impairment Total Score: 10  Exercise  General Exercises - Lower Extremity Ankle Circles/Pumps: AROM;Both;10 reps;Seated Quad Sets: AROM;Both;10 reps;Supine Long Arc Quad: AROM;Both;10 reps;Seated End of Session PT - End of Session Equipment Utilized During Treatment: Gait belt Activity Tolerance: Patient tolerated treatment well Patient left: in chair;with call bell in reach;with family/visitor present Nurse Communication: Mobility status for ambulation General Behavior During Session: [facility] for tasks performed Cognition: Impaired, at baseline  INGOLD,Maximos Zayas 07/20/2011, 11:36 AM [facility] Camel Acute Rehabilitation [phone] (pager)

## 2011-07-20 NOTE — Progress Notes (Signed)
Clinical Social Work:  CSW spoke with patient and two daughters to discuss plans and offer support.  Agreeable to ST-SNF before returning to ALF.  Patient is motivated to receive rehab.  Patient has been at [facility] in the past and may be interested in returning here but also wanted to know other options.  CSW faxed out SNF search and is having facility check to make sure patient still has Medicare days to cover SNF.  CSW will continue to follow.Daysia Vandenboom, [place], [place], #[phone]

## 2011-07-20 NOTE — Progress Notes (Signed)
Patient Barry Dean, 77 year old white male really misses his wife who died in Feb 04, 2011.  He suffers with seizures.  He also misses his son who died of a heart attack at age 69.  But patient enjoys the support of his 3 daughters and 2 grandchildren.  For some time he and one of his daughters lived together; he now lives in an assisted living center.  Chaplain pastoral conversation, prayer, and presence.  Patient expressed appreciation for chaplain's visit.  Will follow-up as needed.

## 2011-07-21 LAB — BASIC METABOLIC PANEL
BUN: 9 mg/dL (ref 6–23)
Chloride: 98 mEq/L (ref 96–112)
GFR calc Af Amer: >90 mL/min (ref >90)
Potassium: 4.2 mEq/L (ref 3.5–5.1)
Sodium: 131 mEq/L — ABNORMAL LOW (ref 135–145)

## 2011-07-21 LAB — CBC
HCT: 30.1 % — ABNORMAL LOW (ref 39.0–52.0)
Platelets: 281 10*3/uL (ref 150–400)
RDW: 13.8 % (ref 11.5–15.5)
WBC: 6.5 10*3/uL (ref 4.0–10.5)

## 2011-07-21 LAB — RETICULOCYTES: Retic Ct Pct: 3.6 % — ABNORMAL HIGH (ref 0.4–3.1)

## 2011-07-21 LAB — IRON AND TIBC
Iron: 26 ug/dL — ABNORMAL LOW (ref 42–135)
Saturation Ratios: 10 % — ABNORMAL LOW (ref 20–55)
TIBC: 261 ug/dL (ref 215–435)

## 2011-07-21 LAB — FERRITIN: Ferritin: 64 ng/mL (ref 22–322)

## 2011-07-21 MED ORDER — CLONIDINE HCL 0.1 MG PO TABS
0.1000 mg | ORAL_TABLET | Freq: Three times a day (TID) | ORAL | Status: DC
  Administered 2011-07-21 – 2011-07-23 (×6): 0.1 mg via ORAL
  Filled 2011-07-21 (×7): qty 1

## 2011-07-21 NOTE — Progress Notes (Signed)
CSW spoke with Pt dtr who [facility] for PVT room on 2nd floor.  Acceptance of offer sent through [facility]. Milus Banister MSW,LCSW w/e Coverage [phone]

## 2011-07-21 NOTE — Progress Notes (Addendum)
SUBJECTIVE Feels ok, no complaints.   1. Syncope   2. Pneumonia     Past Medical History  Diagnosis Date  . Hypertension   . Seizures   . A-fib   . Dehydration   . Deafness   . Parkinson's disease   . Anxiety   . Reflux   . GERD (gastroesophageal reflux disease)   . Arthritis   . Depression    Current Facility-Administered Medications  Medication Dose Route Frequency Provider Last Rate Last Dose  . acetaminophen (TYLENOL) tablet 500 mg  500 mg Oral Q6H PRN [name], MD      . amLODipine (NORVASC) tablet 10 mg  10 mg Oral Daily [name], PHARMD   10 mg at 07/21/11 1101  . amoxicillin-clavulanate (AUGMENTIN) 875-125 MG per tablet 1 tablet  1 tablet Oral Q12H Deira Shimer   1 tablet at 07/21/11 1058  . aspirin chewable tablet 81 mg  81 mg Oral QPM [name], MD   81 mg at 07/21/11 1538  . benazepril (LOTENSIN) tablet 40 mg  40 mg Oral Daily [name], PHARMD   40 mg at 07/21/11 1100  . cloNIDine (CATAPRES) tablet 0.1 mg  0.1 mg Oral TID Vora Clover      . HYDROcodone-acetaminophen (NORCO) 5-325 MG per tablet 1 tablet  1 tablet Oral Q6H PRN [name], MD      . labetalol (NORMODYNE) tablet 100 mg  100 mg Oral BID [name], MD   100 mg at 07/21/11 1102  . lacosamide (VIMPAT) tablet 150 mg  150 mg Oral Daily [name], PHARMD   150 mg at 07/21/11 1223  . lactobacillus acidophilus & bulgar (LACTINEX) chewable tablet 1 tablet  1 tablet Oral TID WC [name], MD   1 tablet at 07/21/11 1537  . levETIRAcetam (KEPPRA) tablet 750 mg  750 mg Oral BID [name], MD   750 mg at 07/21/11 1518  . LORazepam (ATIVAN) tablet 0.5 mg  0.5 mg Oral QHS PRN [name], MD      . pantoprazole (PROTONIX) EC tablet 40 mg  40 mg Oral BID AC Antoine Vandermeulen   40 mg at 07/21/11 1538  . polyethylene glycol powder (GLYCOLAX/MIRALAX) container 17 g  17 g Oral Daily [name], MD   17 g at 07/18/11 1155  . DISCONTD:  cloNIDine (CATAPRES) tablet 0.1 mg  0.1 mg Oral BID [name], MD   0.1 mg at 07/21/11 1101  . DISCONTD: enoxaparin (LOVENOX) injection 40 mg  40 mg Subcutaneous Q24H Draydon Clairmont   40 mg at 07/20/11 1002   Allergies  Allergen Reactions  . Topamax   . Uroxatral (Alfuzosin Hydrochloride)   . Valium    Active Problems:  HCAP (healthcare-associated pneumonia)  Hyponatremia  Syncope  Seizure disorder  HTN (hypertension)   Vital signs in last 24 hours: Temp:  [98 F (36.7 C)-98.5 F (36.9 C)] 98 F (36.7 C) (11/24 1455) Pulse Rate:  [63-107] 73  (11/24 1455) Resp:  [18-20] 20  (11/24 1455) BP: (96-177)/(51-83) 173/83 mmHg (11/24 1455) SpO2:  [90 %-94 %] 90 % (11/24 1455) Weight change:  Last BM Date: 07/20/11  Intake/Output from previous day: 11/23 0701 - 11/24 0700 In: 1140 [P.O.:1140] Out: 750 [Urine:750] Intake/Output this shift:    Lab Results:  Basename 07/21/11 0540 07/20/11 0600  WBC 6.5 6.3  HGB 10.5* 11.0*  HCT 30.1* 32.3*  PLT 281 315   BMET  Basename 07/21/11 0540 07/20/11 0600  NA 131* 127*  K 4.2 4.0  CL 98 94*  CO2 26 25  GLUCOSE 89 94  BUN 9 10  CREATININE 0.73 0.76  CALCIUM 8.9 8.6    Studies/Results: Dg Swallowing Func-no Report  07/20/2011  CLINICAL DATA: determine if aspiration is occurring; dysphagia   FLUOROSCOPY FOR SWALLOWING FUNCTION STUDY:  Fluoroscopy was provided for swallowing function study, which was  administered by a speech pathologist.  Final results and recommendations  from this study are contained within the speech pathology report.      Medications: I have reviewed the patient's current medications.   Physical exam GENERAL- alert and well HEAD- normal atraumatic, no neck masses, normal thyroid, no jvd RESPIRATORY- chest clear, no wheezing, crepitations, rhonchi, normal symmetric air entry CVS- regular rate and rhythm, S1, S2 normal, no murmur, click, rub or gallop ABDOMEN- abdomen is soft without  significant tenderness, masses, organomegaly or guarding NEURO- Grossly normal EXTREMITIES- extremities normal, atraumatic, no cyanosis or edema  Plan 1. HCAP/Aspiration PNA- resolving. Continue Augmentin to complete course. 2. Hyponatremia- Improving Na 131.  3. Seizure disorder- stable on regular meds.  4. Parkinson disease- more with it today. PT evaluation appreciated.  5. Hiatal hernia/esophagitis- on ppi. follow cea.  5. DVT prophylaxis.  6. For SNF early next week,  family agreeable. Spoke with son inlaw.     Kiron Osmun 07/21/2011 7:05 PM Pager: [phone].   Htn- uncontrolled- change clonidine frequency to tid.

## 2011-07-21 NOTE — Progress Notes (Signed)
CSW gave b/o to Son in law who stated he would share with 2 dtrs.  CSW communicated to facilities that were able to offer bed, Pt request for PVT room.  MD interested in Monday d/c.  CSW contact information provided for f/u from dtrs. Milus Banister MSW,LCSW w/e Coverage [phone]

## 2011-07-21 NOTE — Progress Notes (Signed)
1745  Pt arrived as transfer from 2000.  Non-tele, pt is alert, oriented to place & self, oriented to unit and surroundings.  NSL rt f/a dated 11/21 WNL, skin intact with lower legs a little discolored brownish like color, c/cath in place, bed alarm turned on due to hx of confusion, ext a little shaky, upper and lower dentures, hard of hearing, swallows ok.  Continue to monitor for possible d/c to short term rehab prior to returning to [name].  Bonney Leitz RN

## 2011-07-22 LAB — BASIC METABOLIC PANEL
BUN: 10 mg/dL (ref 6–23)
CO2: 26 mEq/L (ref 19–32)
Calcium: 8.8 mg/dL (ref 8.4–10.5)
Chloride: 96 mEq/L (ref 96–112)
Creatinine, Ser: 0.82 mg/dL (ref 0.50–1.35)
GFR calc Af Amer: >90 mL/min (ref >90)

## 2011-07-22 LAB — CBC
HCT: 33 % — ABNORMAL LOW (ref 39.0–52.0)
MCH: 30.7 pg (ref 26.0–34.0)
MCV: 89.7 fL (ref 78.0–100.0)
Platelets: 320 10*3/uL (ref 150–400)
RDW: 13.6 % (ref 11.5–15.5)

## 2011-07-22 NOTE — Progress Notes (Signed)
SUBJECTIVE Feels great, no complaints.   1. Syncope   2. Pneumonia     Past Medical History  Diagnosis Date  . Hypertension   . Seizures   . A-fib   . Dehydration   . Deafness   . Parkinson's disease   . Anxiety   . Reflux   . GERD (gastroesophageal reflux disease)   . Arthritis   . Depression    Current Facility-Administered Medications  Medication Dose Route Frequency Provider Last Rate Last Dose  . acetaminophen (TYLENOL) tablet 500 mg  500 mg Oral Q6H PRN [name], MD      . amLODipine (NORVASC) tablet 10 mg  10 mg Oral Daily [name], PHARMD   10 mg at 07/22/11 1053  . amoxicillin-clavulanate (AUGMENTIN) 875-125 MG per tablet 1 tablet  1 tablet Oral Q12H Laylani Pudwill   1 tablet at 07/22/11 1053  . aspirin chewable tablet 81 mg  81 mg Oral QPM [name], MD   81 mg at 07/21/11 1538  . benazepril (LOTENSIN) tablet 40 mg  40 mg Oral Daily [name], PHARMD   40 mg at 07/22/11 1053  . cloNIDine (CATAPRES) tablet 0.1 mg  0.1 mg Oral TID Keyanni Whittinghill   0.1 mg at 07/22/11 1053  . HYDROcodone-acetaminophen (NORCO) 5-325 MG per tablet 1 tablet  1 tablet Oral Q6H PRN [name], MD      . labetalol (NORMODYNE) tablet 100 mg  100 mg Oral BID [name], MD   100 mg at 07/22/11 1052  . lacosamide (VIMPAT) tablet 150 mg  150 mg Oral Daily [name], PHARMD   150 mg at 07/22/11 1052  . lactobacillus acidophilus & bulgar (LACTINEX) chewable tablet 1 tablet  1 tablet Oral TID WC [name], MD   1 tablet at 07/22/11 1054  . levETIRAcetam (KEPPRA) tablet 750 mg  750 mg Oral BID [name], MD   750 mg at 07/22/11 1052  . LORazepam (ATIVAN) tablet 0.5 mg  0.5 mg Oral QHS PRN [name], MD      . pantoprazole (PROTONIX) EC tablet 40 mg  40 mg Oral BID AC Sedona Wenk   40 mg at 07/22/11 0636  . polyethylene glycol powder (GLYCOLAX/MIRALAX) container 17 g  17 g Oral Daily [name], MD   17 g at 07/18/11  1155  . DISCONTD: cloNIDine (CATAPRES) tablet 0.1 mg  0.1 mg Oral BID [name], MD   0.1 mg at 07/21/11 1101   Allergies  Allergen Reactions  . Topamax   . Uroxatral (Alfuzosin Hydrochloride)   . Valium    Active Problems:  HCAP (healthcare-associated pneumonia)  Hyponatremia  Syncope  Seizure disorder  HTN (hypertension)   Vital signs in last 24 hours: Temp:  [98 F (36.7 C)-98.5 F (36.9 C)] 98.5 F (36.9 C) (11/25 0425) Pulse Rate:  [62-76] 62  (11/25 0425) Resp:  [18-20] 18  (11/25 0425) BP: (113-173)/(65-83) 113/65 mmHg (11/25 0425) SpO2:  [90 %-94 %] 94 % (11/25 0425) Weight change:  Last BM Date: 07/21/11  Intake/Output from previous day: 11/24 0701 - 11/25 0700 In: -  Out: 1275 [Urine:1275] Intake/Output this shift: Total I/O In: 360 [P.O.:360] Out: -   Lab Results:  Basename 07/22/11 0600 07/21/11 0540  WBC 6.9 6.5  HGB 11.3* 10.5*  HCT 33.0* 30.1*  PLT 320 281   BMET  Basename 07/22/11 0600 07/21/11 0540  NA 129* 131*  K 4.1 4.2  CL 96 98  CO2  26 26  GLUCOSE 88 89  BUN 10 9  CREATININE 0.82 0.73  CALCIUM 8.8 8.9     Medications: I have reviewed the patient's current medications.   Physical exam GENERAL- alert, robust and well HEAD- normal atraumatic, no neck masses, normal thyroid, no jvd RESPIRATORY- chest clear, no wheezing, crepitations, rhonchi, normal symmetric air entry CVS- regular rate and rhythm, S1, S2 normal, no murmur, click, rub or gallop ABDOMEN- abdomen is soft without significant tenderness, masses, organomegaly or guarding NEURO- Grossly normal except hard of hearing. EXTREMITIES- extremities normal, atraumatic, no cyanosis or edema  Plan 1. HCAP/Aspiration PNA- resolving. Continue Augmentin to complete course tomorrow.  2. Hyponatremia- stable.  3. Seizure disorder- stable on regular meds.  4. Parkinson disease- more with it today. PT evaluation appreciated.  5. Hiatal hernia/esophagitis- on ppi. follow  cea.  5. DVT prophylaxis.  6. Htn- better controlled. 7. For [facility] tomorrow- interested in [place]. Discussed plan of care with daughter Erskine Squibb and other relatives at bed side.        Birch Farino 07/22/2011 1:21 PM Pager: [phone].

## 2011-07-23 LAB — CULTURE, BLOOD (ROUTINE X 2)
Culture  Setup Time: 201211202102
Culture: NO GROWTH
Culture: NO GROWTH

## 2011-07-23 MED ORDER — LORAZEPAM 0.5 MG PO TABS: 0.5000 mg | ORAL_TABLET | Freq: Every evening | ORAL | Status: DC | PRN

## 2011-07-23 MED ORDER — CLONIDINE HCL 0.1 MG PO TABS: 0.1000 mg | ORAL_TABLET | Freq: Three times a day (TID) | ORAL | Status: DC

## 2011-07-23 MED ORDER — HYDROCODONE-ACETAMINOPHEN 5-500 MG PO TABS: 1.0000 | ORAL_TABLET | Freq: Four times a day (QID) | ORAL | Status: DC | PRN

## 2011-07-23 MED ORDER — PANTOPRAZOLE SODIUM 40 MG PO TBEC: 40.0000 mg | DELAYED_RELEASE_TABLET | Freq: Two times a day (BID) | ORAL | Status: DC

## 2011-07-23 MED ORDER — AMOXICILLIN-POT CLAVULANATE 875-125 MG PO TABS: 1.0000 | ORAL_TABLET | Freq: Two times a day (BID) | ORAL | Status: AC

## 2011-07-23 NOTE — Progress Notes (Signed)
Physical Therapy Treatment Patient Details Name: Barry Dean MRN: [id number] DOB: December 16, 1923 Today's Date: 07/23/2011  PT Assessment/Plan  PT - Assessment/Plan Comments on Treatment Session: Pt. tolerated treatment well with O2 remaining above 90% during ambulation and exercise.  Pt.'s daughters present and very thankful at completion of session.  PT Plan: Discharge plan remains appropriate Follow Up Recommendations: Skilled nursing facility;24 hour supervision/assistance PT Goals  Acute Rehab PT Goals PT Goal: Supine/Side to Sit - Progress: Progressing toward goal PT Transfer Goal: Sit to Stand/Stand to Sit - Progress: Progressing toward goal PT Goal: Ambulate - Progress: Met  PT Treatment Precautions/Restrictions  Precautions Precautions: Fall Required Braces or Orthoses: No Restrictions Weight Bearing Restrictions: No Mobility (including Balance) Bed Mobility Bed Mobility: Yes Rolling Left: 5: Supervision Rolling Left Details (indicate cue type and reason): Cues for hand placement; patient wanting to use bedside table to pull himself over.  Left Sidelying to Sit: 4: Min assist;HOB flat Left Sidelying to Sit Details (indicate cue type and reason): Upper extremity assist, cues for initiation and to bring lower extremities off of bed.  Sitting - Scoot to Edge of Bed: 5: Supervision Sitting - Scoot to [place] of Bed Details (indicate cue type and reason): Cues for initiation.  Transfers Transfers: Yes Sit to Stand: 4: Min assist;With upper extremity assist;From bed Sit to Stand Details (indicate cue type and reason): Cues for hand placement.  Stand to Sit: 3: Mod assist;With armrests;With upper extremity assist;To chair/3-in-1 Stand to Sit Details: Assist and cues to control descent.  Ambulation/Gait Ambulation/Gait: Yes Ambulation/Gait Assistance: 5: Supervision Ambulation/Gait Assistance Details (indicate cue type and reason): Cues to stay inside RW and stand upright.    Ambulation Distance (Feet): 325 Feet Assistive device: Rolling walker Gait Pattern: Step-through pattern;Shuffle;Decreased stride length;Trunk flexed    Exercise  General Exercises - Lower Extremity Long Arc Quad: AROM;Both;15 reps;Seated Hip Flexion/Marching: AROM;Both;Other reps (comment);Seated (15 reps ) End of Session PT - End of Session Equipment Utilized During Treatment: Gait belt Activity Tolerance: Patient tolerated treatment well Patient left: in chair;with call bell in reach;with family/visitor present Nurse Communication: Mobility status for transfers;Mobility status for ambulation General Behavior During Session: [facility] for tasks performed Cognition: Impaired, at baseline  Brynlei Klausner, SPT  07/23/2011, 10:41 AM

## 2011-07-23 NOTE — Discharge Summary (Signed)
DISCHARGE SUMMARY  Barry Dean  MR#: [id number]  DOB:Jun 21, 1924  Date of Admission: 07/17/2011 Date of Discharge: 07/23/2011  Attending Physician:Lorelee Mclaurin  Patient's PCP:No primary provider on file.  Consults:Treatment Team:  [name], MD  Discharge Diagnoses: Present on Admission:  .HCAP (healthcare-associated pneumonia) .Seizure disorder .HTN (hypertension) .Hyponatremia .Syncope Hiatal hernia Genella Rife    Current Discharge Medication List    START taking these medications   Details  amoxicillin-clavulanate (AUGMENTIN) 875-125 MG per tablet Take 1 tablet by mouth every 12 (twelve) hours. Qty: 4 tablet, Refills: 0    pantoprazole (PROTONIX) 40 MG tablet Take 1 tablet (40 mg total) by mouth 2 (two) times daily before a meal. Qty: 30 tablet, Refills: 0      CONTINUE these medications which have CHANGED   Details  cloNIDine (CATAPRES) 0.1 MG tablet Take 1 tablet (0.1 mg total) by mouth 3 (three) times daily. Qty: 60 tablet, Refills: 0    HYDROcodone-acetaminophen (VICODIN) 5-500 MG per tablet Take 1 tablet by mouth every 6 (six) hours as needed. For pain Qty: 20 tablet, Refills: 0    LORazepam (ATIVAN) 0.5 MG tablet Take 1 tablet (0.5 mg total) by mouth at bedtime as needed. For anxiety Qty: 20 tablet, Refills: 0      CONTINUE these medications which have NOT CHANGED   Details  acetaminophen (TYLENOL) 500 MG tablet Take 500 mg by mouth every 6 (six) hours as needed. For pain    amLODipine-benazepril (LOTREL) 10-40 MG per capsule Take 1 capsule by mouth daily.      aspirin 81 MG chewable tablet Chew 81 mg by mouth every evening.     labetalol (NORMODYNE) 100 MG tablet Take 100 mg by mouth 2 (two) times daily.      Lacosamide (VIMPAT) 150 MG TABS Take 1 tablet by mouth daily.     levETIRAcetam (KEPPRA) 750 MG tablet Take 750 mg by mouth 2 (two) times daily.     Melatonin 5 MG TABS Take 1 tablet by mouth at bedtime as needed.      polyethylene  glycol powder (GLYCOLAX/MIRALAX) powder Take 17 g by mouth daily.       STOP taking these medications     ciprofloxacin (CIPRO) 250 MG tablet           Hospital Course: [name] was admitted after a syncopal episode at his facility. He was later found to have HCAP and hyponatremia, as well as hiatal hernia by ct chest. There was concern that he had aspirated, hence  swallow evaluation which was unremarkable. A large hiatal hernia was found on ct, hence d/c on protonix. Hyponatremia likely combination of factors, including dehydration/pneumonia. Labs included a normal CEA, normocytic anemia, apparent IDA anemia, ferritin 64, iron level 26, hb 11.3. Would avoid nsaids as possible and repeat cbc in next few weeks, consider gi eval outpatients as necessary. I did not start him on ferrous sulfate as his hemoglobin is stable. He is being discharged to SNF today, in stable condition, to complete 2 more days of augmentin.   Day of Discharge BP 143/64  Pulse 59  Temp(Src) 97.4 F (36.3 C) (Oral)  Resp 19  Ht 6\' 2"  (1.88 m)  Wt 94.9 kg (209 lb 3.5 oz)  BMI 26.86 kg/m2  SpO2 94%  Physical Exam: At baseline.   Disposition: to SNF.   Follow-up Appts: Discharge Orders    Future Orders Please Complete By Expires   Diet - low sodium heart healthy  Increase activity slowly         Follow-up with Dr[facility]at [facility].   Tests Needing Follow-up: CBC. Time spent for d/c prep 35 minutes. Signed: Oliana Gowens 07/23/2011, 12:04 PM

## 2011-07-23 NOTE — Progress Notes (Signed)
Seen and agree with SPT note Daneka Lantigua Tabor, PT [phone]  

## 2011-07-24 NOTE — Progress Notes (Signed)
Utilization review completed.  

## 2011-08-29 DIAGNOSIS — K219 Gastro-esophageal reflux disease without esophagitis: Secondary | ICD-10-CM | POA: Diagnosis not present

## 2011-08-29 DIAGNOSIS — R269 Unspecified abnormalities of gait and mobility: Secondary | ICD-10-CM | POA: Diagnosis not present

## 2011-08-29 DIAGNOSIS — I10 Essential (primary) hypertension: Secondary | ICD-10-CM | POA: Diagnosis not present

## 2011-08-31 DIAGNOSIS — R279 Unspecified lack of coordination: Secondary | ICD-10-CM | POA: Diagnosis not present

## 2011-08-31 DIAGNOSIS — R262 Difficulty in walking, not elsewhere classified: Secondary | ICD-10-CM | POA: Diagnosis not present

## 2011-08-31 DIAGNOSIS — M6281 Muscle weakness (generalized): Secondary | ICD-10-CM | POA: Diagnosis not present

## 2011-09-03 DIAGNOSIS — R262 Difficulty in walking, not elsewhere classified: Secondary | ICD-10-CM | POA: Diagnosis not present

## 2011-09-03 DIAGNOSIS — R279 Unspecified lack of coordination: Secondary | ICD-10-CM | POA: Diagnosis not present

## 2011-09-03 DIAGNOSIS — M6281 Muscle weakness (generalized): Secondary | ICD-10-CM | POA: Diagnosis not present

## 2011-09-05 DIAGNOSIS — M6281 Muscle weakness (generalized): Secondary | ICD-10-CM | POA: Diagnosis not present

## 2011-09-05 DIAGNOSIS — R279 Unspecified lack of coordination: Secondary | ICD-10-CM | POA: Diagnosis not present

## 2011-09-05 DIAGNOSIS — R262 Difficulty in walking, not elsewhere classified: Secondary | ICD-10-CM | POA: Diagnosis not present

## 2011-09-06 DIAGNOSIS — M6281 Muscle weakness (generalized): Secondary | ICD-10-CM | POA: Diagnosis not present

## 2011-09-06 DIAGNOSIS — R262 Difficulty in walking, not elsewhere classified: Secondary | ICD-10-CM | POA: Diagnosis not present

## 2011-09-06 DIAGNOSIS — R279 Unspecified lack of coordination: Secondary | ICD-10-CM | POA: Diagnosis not present

## 2011-09-10 DIAGNOSIS — M6281 Muscle weakness (generalized): Secondary | ICD-10-CM | POA: Diagnosis not present

## 2011-09-10 DIAGNOSIS — R279 Unspecified lack of coordination: Secondary | ICD-10-CM | POA: Diagnosis not present

## 2011-09-10 DIAGNOSIS — R262 Difficulty in walking, not elsewhere classified: Secondary | ICD-10-CM | POA: Diagnosis not present

## 2011-09-11 DIAGNOSIS — M6281 Muscle weakness (generalized): Secondary | ICD-10-CM | POA: Diagnosis not present

## 2011-09-11 DIAGNOSIS — R279 Unspecified lack of coordination: Secondary | ICD-10-CM | POA: Diagnosis not present

## 2011-09-11 DIAGNOSIS — R262 Difficulty in walking, not elsewhere classified: Secondary | ICD-10-CM | POA: Diagnosis not present

## 2011-09-13 DIAGNOSIS — R279 Unspecified lack of coordination: Secondary | ICD-10-CM | POA: Diagnosis not present

## 2011-09-13 DIAGNOSIS — R262 Difficulty in walking, not elsewhere classified: Secondary | ICD-10-CM | POA: Diagnosis not present

## 2011-09-13 DIAGNOSIS — M6281 Muscle weakness (generalized): Secondary | ICD-10-CM | POA: Diagnosis not present

## 2011-09-17 DIAGNOSIS — R279 Unspecified lack of coordination: Secondary | ICD-10-CM | POA: Diagnosis not present

## 2011-09-17 DIAGNOSIS — R262 Difficulty in walking, not elsewhere classified: Secondary | ICD-10-CM | POA: Diagnosis not present

## 2011-09-17 DIAGNOSIS — M6281 Muscle weakness (generalized): Secondary | ICD-10-CM | POA: Diagnosis not present

## 2011-09-18 DIAGNOSIS — M6281 Muscle weakness (generalized): Secondary | ICD-10-CM | POA: Diagnosis not present

## 2011-09-18 DIAGNOSIS — R262 Difficulty in walking, not elsewhere classified: Secondary | ICD-10-CM | POA: Diagnosis not present

## 2011-09-18 DIAGNOSIS — R279 Unspecified lack of coordination: Secondary | ICD-10-CM | POA: Diagnosis not present

## 2011-09-19 DIAGNOSIS — R262 Difficulty in walking, not elsewhere classified: Secondary | ICD-10-CM | POA: Diagnosis not present

## 2011-09-19 DIAGNOSIS — R279 Unspecified lack of coordination: Secondary | ICD-10-CM | POA: Diagnosis not present

## 2011-09-19 DIAGNOSIS — M6281 Muscle weakness (generalized): Secondary | ICD-10-CM | POA: Diagnosis not present

## 2011-09-24 DIAGNOSIS — R262 Difficulty in walking, not elsewhere classified: Secondary | ICD-10-CM | POA: Diagnosis not present

## 2011-09-24 DIAGNOSIS — M6281 Muscle weakness (generalized): Secondary | ICD-10-CM | POA: Diagnosis not present

## 2011-09-24 DIAGNOSIS — R279 Unspecified lack of coordination: Secondary | ICD-10-CM | POA: Diagnosis not present

## 2011-09-25 DIAGNOSIS — M6281 Muscle weakness (generalized): Secondary | ICD-10-CM | POA: Diagnosis not present

## 2011-09-25 DIAGNOSIS — R279 Unspecified lack of coordination: Secondary | ICD-10-CM | POA: Diagnosis not present

## 2011-09-25 DIAGNOSIS — R262 Difficulty in walking, not elsewhere classified: Secondary | ICD-10-CM | POA: Diagnosis not present

## 2011-09-27 DIAGNOSIS — R279 Unspecified lack of coordination: Secondary | ICD-10-CM | POA: Diagnosis not present

## 2011-09-27 DIAGNOSIS — R262 Difficulty in walking, not elsewhere classified: Secondary | ICD-10-CM | POA: Diagnosis not present

## 2011-09-27 DIAGNOSIS — M6281 Muscle weakness (generalized): Secondary | ICD-10-CM | POA: Diagnosis not present

## 2011-10-01 DIAGNOSIS — R279 Unspecified lack of coordination: Secondary | ICD-10-CM | POA: Diagnosis not present

## 2011-10-01 DIAGNOSIS — R262 Difficulty in walking, not elsewhere classified: Secondary | ICD-10-CM | POA: Diagnosis not present

## 2011-10-01 DIAGNOSIS — M6281 Muscle weakness (generalized): Secondary | ICD-10-CM | POA: Diagnosis not present

## 2011-10-02 DIAGNOSIS — R262 Difficulty in walking, not elsewhere classified: Secondary | ICD-10-CM | POA: Diagnosis not present

## 2011-10-02 DIAGNOSIS — M6281 Muscle weakness (generalized): Secondary | ICD-10-CM | POA: Diagnosis not present

## 2011-10-02 DIAGNOSIS — R279 Unspecified lack of coordination: Secondary | ICD-10-CM | POA: Diagnosis not present

## 2011-10-08 DIAGNOSIS — M6281 Muscle weakness (generalized): Secondary | ICD-10-CM | POA: Diagnosis not present

## 2011-10-08 DIAGNOSIS — R279 Unspecified lack of coordination: Secondary | ICD-10-CM | POA: Diagnosis not present

## 2011-10-08 DIAGNOSIS — R262 Difficulty in walking, not elsewhere classified: Secondary | ICD-10-CM | POA: Diagnosis not present

## 2011-10-24 DIAGNOSIS — G250 Essential tremor: Secondary | ICD-10-CM | POA: Diagnosis not present

## 2011-10-24 DIAGNOSIS — R269 Unspecified abnormalities of gait and mobility: Secondary | ICD-10-CM | POA: Diagnosis not present

## 2011-10-24 DIAGNOSIS — G40309 Generalized idiopathic epilepsy and epileptic syndromes, not intractable, without status epilepticus: Secondary | ICD-10-CM | POA: Diagnosis not present

## 2011-11-28 DIAGNOSIS — S90129A Contusion of unspecified lesser toe(s) without damage to nail, initial encounter: Secondary | ICD-10-CM | POA: Diagnosis not present

## 2011-11-28 DIAGNOSIS — M79609 Pain in unspecified limb: Secondary | ICD-10-CM | POA: Diagnosis not present

## 2011-11-28 DIAGNOSIS — B351 Tinea unguium: Secondary | ICD-10-CM | POA: Diagnosis not present

## 2011-11-30 DIAGNOSIS — I4891 Unspecified atrial fibrillation: Secondary | ICD-10-CM | POA: Diagnosis not present

## 2011-11-30 DIAGNOSIS — I059 Rheumatic mitral valve disease, unspecified: Secondary | ICD-10-CM | POA: Diagnosis not present

## 2011-11-30 DIAGNOSIS — I10 Essential (primary) hypertension: Secondary | ICD-10-CM | POA: Diagnosis not present

## 2011-11-30 DIAGNOSIS — H6120 Impacted cerumen, unspecified ear: Secondary | ICD-10-CM | POA: Diagnosis not present

## 2011-12-04 DIAGNOSIS — H6120 Impacted cerumen, unspecified ear: Secondary | ICD-10-CM | POA: Diagnosis not present

## 2011-12-04 DIAGNOSIS — H60399 Other infective otitis externa, unspecified ear: Secondary | ICD-10-CM | POA: Diagnosis not present

## 2012-01-23 ENCOUNTER — Emergency Department (HOSPITAL_BASED_OUTPATIENT_CLINIC_OR_DEPARTMENT_OTHER): Payer: Medicare Other

## 2012-01-23 ENCOUNTER — Emergency Department (HOSPITAL_BASED_OUTPATIENT_CLINIC_OR_DEPARTMENT_OTHER)
Admission: EM | Admit: 2012-01-23 | Discharge: 2012-01-23 | Disposition: A | Discharge status: Home / Self Care | Payer: Medicare Other | Attending: Emergency Medicine | Admitting: Emergency Medicine

## 2012-01-23 ENCOUNTER — Encounter (HOSPITAL_BASED_OUTPATIENT_CLINIC_OR_DEPARTMENT_OTHER): Payer: Self-pay | Admitting: *Deleted

## 2012-01-23 DIAGNOSIS — Y9210 Unspecified residential institution as the place of occurrence of the external cause: Secondary | ICD-10-CM | POA: Insufficient documentation

## 2012-01-23 DIAGNOSIS — S0990XA Unspecified injury of head, initial encounter: Secondary | ICD-10-CM | POA: Insufficient documentation

## 2012-01-23 DIAGNOSIS — G20 Parkinson's disease: Secondary | ICD-10-CM | POA: Insufficient documentation

## 2012-01-23 DIAGNOSIS — I4891 Unspecified atrial fibrillation: Secondary | ICD-10-CM | POA: Diagnosis not present

## 2012-01-23 DIAGNOSIS — W010XXA Fall on same level from slipping, tripping and stumbling without subsequent striking against object, initial encounter: Secondary | ICD-10-CM | POA: Insufficient documentation

## 2012-01-23 DIAGNOSIS — S0100XA Unspecified open wound of scalp, initial encounter: Secondary | ICD-10-CM | POA: Diagnosis not present

## 2012-01-23 DIAGNOSIS — K219 Gastro-esophageal reflux disease without esophagitis: Secondary | ICD-10-CM | POA: Insufficient documentation

## 2012-01-23 DIAGNOSIS — Z85820 Personal history of malignant melanoma of skin: Secondary | ICD-10-CM | POA: Diagnosis not present

## 2012-01-23 DIAGNOSIS — R079 Chest pain, unspecified: Secondary | ICD-10-CM | POA: Diagnosis not present

## 2012-01-23 DIAGNOSIS — S0001XA Abrasion of scalp, initial encounter: Secondary | ICD-10-CM

## 2012-01-23 DIAGNOSIS — G20A1 Parkinson's disease without dyskinesia, without mention of fluctuations: Secondary | ICD-10-CM | POA: Insufficient documentation

## 2012-01-23 DIAGNOSIS — W19XXXA Unspecified fall, initial encounter: Secondary | ICD-10-CM

## 2012-01-23 DIAGNOSIS — IMO0002 Reserved for concepts with insufficient information to code with codable children: Secondary | ICD-10-CM | POA: Insufficient documentation

## 2012-01-23 DIAGNOSIS — T148XXA Other injury of unspecified body region, initial encounter: Secondary | ICD-10-CM | POA: Diagnosis not present

## 2012-01-23 HISTORY — DX: Benign prostatic hyperplasia without lower urinary tract symptoms: N40.0

## 2012-01-23 HISTORY — DX: Unspecified convulsions: R56.9

## 2012-01-23 HISTORY — DX: Syncope and collapse: R55

## 2012-01-23 HISTORY — DX: Essential tremor: G25.0

## 2012-01-23 HISTORY — DX: Malignant melanoma of skin, unspecified: C43.9

## 2012-01-23 HISTORY — DX: Unspecified atrial fibrillation: I48.91

## 2012-01-23 MED ORDER — TETANUS-DIPHTHERIA TOXOIDS TD 5-2 LFU IM INJ: 0.5000 mL | INJECTION | Freq: Once | INTRAMUSCULAR | Status: DC

## 2012-01-23 NOTE — Discharge Instructions (Signed)
Return to the ED with any concerns including vomiting, chest pain, difficulty breathing, fainting, decreased level of alertness/lethargy, or any other alarming symptoms

## 2012-01-23 NOTE — ED Notes (Signed)
EMS reports patient was sitting at a table, stood up and stumbled and fell.  Lac to posterior head,  No loc.  Denies pain .  C/O chest pain after fall, with no radiation.  12 lead unremarkable.  Hx a fib.

## 2012-01-23 NOTE — ED Provider Notes (Signed)
History     CSN: [id number]  Arrival date & time 01/23/12  1003   First MD Initiated Contact with Patient 01/23/12 1010      Chief Complaint  Patient presents with  . Fall    (Consider location/radiation/quality/duration/timing/severity/associated sxs/prior treatment) HPI Pt presents after mechanical fall at his living facility.  Pt was eating breakfast, then stood up at table, lost his balance, stumbled and fell.  Pt remembers entire event.  Denies chest pain, no dizziness or lightheadedness.  He hit the back of his head.  Has no neck or back pain.  No pain in extremities.  Takes aspirin, hx of afib- not on any other blood thinners. There are no alleviating or modifying factors, there are no other associated systemic symptoms.   Past Medical History  Diagnosis Date  . Atrial fibrillation   . Parkinson disease   . Tremor, essential   . Seizure   . BPH (benign prostatic hyperplasia)   . Syncope   . Deaf   . Melanoma   . GERD (gastroesophageal reflux disease)     History reviewed. No pertinent past surgical history.  No family history on file.  History  Substance Use Topics  . Smoking status: Never Smoker   . Smokeless tobacco: Not on file  . Alcohol Use: No      Review of Systems ROS reviewed and all otherwise negative except for mentioned in HPI  Allergies  Review of patient's allergies indicates no known allergies.  Home Medications   Current Outpatient Rx  Name Route Sig Dispense Refill  . ACETAMINOPHEN 500 MG PO TABS Oral Take 500 mg by mouth every 6 (six) hours as needed.    . ASPIRIN 81 MG PO TABS Oral Take 81 mg by mouth daily.    Marland Kitchen CLONIDINE HCL 0.1 MG PO TABS Oral Take 0.1 mg by mouth 2 (two) times daily.    Marland Kitchen HYDROCODONE-ACETAMINOPHEN 5-325 MG PO TABS Oral Take 1 tablet by mouth every 6 (six) hours as needed.    Marland Kitchen LABETALOL HCL 100 MG PO TABS Oral Take 100 mg by mouth 2 (two) times daily.    Marland Kitchen LEVETIRACETAM 100 MG/ML PO SOLN Oral Take 10 mg/kg by  mouth 2 (two) times daily.    Marland Kitchen LORAZEPAM 0.5 MG PO TABS Oral Take 0.5 mg by mouth every 8 (eight) hours.    Marland Kitchen MELATONIN 3 MG PO CAPS Oral Take by mouth.    . OMEPRAZOLE 20 MG PO CPDR Oral Take 20 mg by mouth daily.    Marland Kitchen POLYETHYLENE GLYCOL 3350 PO PACK Oral Take 17 g by mouth daily.      BP 147/61  Pulse 74  Temp(Src) 98.3 F (36.8 C) (Oral)  Resp 20  Ht 6\' 2"  (1.88 m)  Wt 225 lb (102.059 kg)  BMI 28.89 kg/m2  SpO2 89% Vitals reviewed Physical Exam Physical Examination: General appearance - alert, well appearing, and in no distress Mental status - alert, oriented to person, place, and time Eyes - pupils equal and reactive, extraocular eye movements intact Mouth - mucous membranes moist, pharynx normal without lesions Chest - clear to auscultation, no wheezes, rales or rhonchi, symmetric air entry Heart - normal rate, regular rhythm, normal S1, S2, no murmurs, rubs, clicks or gallops Abdomen - soft, nontender, nondistended, no masses or organomegaly Back exam - no midline or paraspinal c/t/l tenderness Neurological - alert, oriented, normal speech, no focal findings, strength 5/5 in extremities x 4, sensation intact Musculoskeletal - no joint  tenderness, deformity or swelling Extremities - peripheral pulses normal, no pedal edema, no clubbing or cyanosis Skin - normal coloration and turgor, no rashes, approx < 1/2 cm superficial abrasion/laceration on posterior occiput- wound edges well approximated and bleeding controlled Head- Benedict, superficial laceration/abrasion on posterior occiput as noted  ED Course  Procedures (including critical care time)  Labs Reviewed - No data to display Ct Head Wo Contrast  01/23/2012  *RADIOLOGY REPORT*  Clinical Data: Status post fall  CT HEAD WITHOUT CONTRAST  Technique:  Contiguous axial images were obtained from the base of the skull through the vertex without contrast.  Comparison: None  Findings: There is diffuse patchy low density throughout  the subcortical and periventricular white matter consistent with chronic small vessel ischemic change.  There is prominence of the sulci and ventricles consistent with brain atrophy.  There is no evidence for acute brain infarct, hemorrhage or mass.  The mastoid air cells and paranasal sinuses are clear.  The skull appears intact.  IMPRESSION:  1.  Small vessel ischemic change and brain atrophy. 2.  No acute intracranial abnormalities.  Original Report Authenticated By: Rosealee Albee, M.D.     1. Fall   2. Minor head injury   3. Abrasion of scalp       MDM  Pt presenting with mechanical fall and small abrasion/laceration to posterior scalp.  Pt specifically denies to me having any other symptoms including no chest pain, no LOC, no dizziness.  Head CT negative.  Tetanus was found to have been given in 2010.  Pt discharged with strict return precautions.  He is agreeable with this plan.         Ethelda Chick, MD 01/23/12 [phone]

## 2012-01-23 NOTE — ED Notes (Signed)
Called report to [employer] at [facility].

## 2012-01-23 NOTE — ED Notes (Signed)
cbg 84. 

## 2012-01-23 NOTE — ED Notes (Signed)
Pt had boostrix August 2010 per PCP.

## 2012-01-24 ENCOUNTER — Encounter (HOSPITAL_COMMUNITY): Payer: Self-pay | Admitting: *Deleted

## 2012-01-30 DIAGNOSIS — Z9181 History of falling: Secondary | ICD-10-CM | POA: Diagnosis not present

## 2012-01-30 DIAGNOSIS — M6281 Muscle weakness (generalized): Secondary | ICD-10-CM | POA: Diagnosis not present

## 2012-01-30 DIAGNOSIS — R279 Unspecified lack of coordination: Secondary | ICD-10-CM | POA: Diagnosis not present

## 2012-01-31 DIAGNOSIS — M6281 Muscle weakness (generalized): Secondary | ICD-10-CM | POA: Diagnosis not present

## 2012-01-31 DIAGNOSIS — R279 Unspecified lack of coordination: Secondary | ICD-10-CM | POA: Diagnosis not present

## 2012-01-31 DIAGNOSIS — Z9181 History of falling: Secondary | ICD-10-CM | POA: Diagnosis not present

## 2012-02-01 DIAGNOSIS — M6281 Muscle weakness (generalized): Secondary | ICD-10-CM | POA: Diagnosis not present

## 2012-02-01 DIAGNOSIS — R279 Unspecified lack of coordination: Secondary | ICD-10-CM | POA: Diagnosis not present

## 2012-02-01 DIAGNOSIS — Z9181 History of falling: Secondary | ICD-10-CM | POA: Diagnosis not present

## 2012-02-04 DIAGNOSIS — R279 Unspecified lack of coordination: Secondary | ICD-10-CM | POA: Diagnosis not present

## 2012-02-04 DIAGNOSIS — M6281 Muscle weakness (generalized): Secondary | ICD-10-CM | POA: Diagnosis not present

## 2012-02-04 DIAGNOSIS — Z9181 History of falling: Secondary | ICD-10-CM | POA: Diagnosis not present

## 2012-02-05 DIAGNOSIS — R279 Unspecified lack of coordination: Secondary | ICD-10-CM | POA: Diagnosis not present

## 2012-02-05 DIAGNOSIS — Z9181 History of falling: Secondary | ICD-10-CM | POA: Diagnosis not present

## 2012-02-05 DIAGNOSIS — M6281 Muscle weakness (generalized): Secondary | ICD-10-CM | POA: Diagnosis not present

## 2012-02-06 DIAGNOSIS — Z9181 History of falling: Secondary | ICD-10-CM | POA: Diagnosis not present

## 2012-02-06 DIAGNOSIS — R279 Unspecified lack of coordination: Secondary | ICD-10-CM | POA: Diagnosis not present

## 2012-02-06 DIAGNOSIS — M6281 Muscle weakness (generalized): Secondary | ICD-10-CM | POA: Diagnosis not present

## 2012-02-12 DIAGNOSIS — R279 Unspecified lack of coordination: Secondary | ICD-10-CM | POA: Diagnosis not present

## 2012-02-12 DIAGNOSIS — Z9181 History of falling: Secondary | ICD-10-CM | POA: Diagnosis not present

## 2012-02-12 DIAGNOSIS — M6281 Muscle weakness (generalized): Secondary | ICD-10-CM | POA: Diagnosis not present

## 2012-02-13 DIAGNOSIS — R279 Unspecified lack of coordination: Secondary | ICD-10-CM | POA: Diagnosis not present

## 2012-02-13 DIAGNOSIS — M6281 Muscle weakness (generalized): Secondary | ICD-10-CM | POA: Diagnosis not present

## 2012-02-13 DIAGNOSIS — Z9181 History of falling: Secondary | ICD-10-CM | POA: Diagnosis not present

## 2012-02-14 DIAGNOSIS — Z9181 History of falling: Secondary | ICD-10-CM | POA: Diagnosis not present

## 2012-02-14 DIAGNOSIS — M6281 Muscle weakness (generalized): Secondary | ICD-10-CM | POA: Diagnosis not present

## 2012-02-14 DIAGNOSIS — R279 Unspecified lack of coordination: Secondary | ICD-10-CM | POA: Diagnosis not present

## 2012-02-18 DIAGNOSIS — M6281 Muscle weakness (generalized): Secondary | ICD-10-CM | POA: Diagnosis not present

## 2012-02-18 DIAGNOSIS — Z9181 History of falling: Secondary | ICD-10-CM | POA: Diagnosis not present

## 2012-02-18 DIAGNOSIS — R279 Unspecified lack of coordination: Secondary | ICD-10-CM | POA: Diagnosis not present

## 2012-02-19 DIAGNOSIS — R279 Unspecified lack of coordination: Secondary | ICD-10-CM | POA: Diagnosis not present

## 2012-02-19 DIAGNOSIS — M6281 Muscle weakness (generalized): Secondary | ICD-10-CM | POA: Diagnosis not present

## 2012-02-19 DIAGNOSIS — Z9181 History of falling: Secondary | ICD-10-CM | POA: Diagnosis not present

## 2012-02-21 DIAGNOSIS — Z9181 History of falling: Secondary | ICD-10-CM | POA: Diagnosis not present

## 2012-02-21 DIAGNOSIS — R279 Unspecified lack of coordination: Secondary | ICD-10-CM | POA: Diagnosis not present

## 2012-02-21 DIAGNOSIS — M6281 Muscle weakness (generalized): Secondary | ICD-10-CM | POA: Diagnosis not present

## 2012-03-31 DIAGNOSIS — R350 Frequency of micturition: Secondary | ICD-10-CM | POA: Diagnosis not present

## 2012-03-31 DIAGNOSIS — I10 Essential (primary) hypertension: Secondary | ICD-10-CM | POA: Diagnosis not present

## 2012-04-01 DIAGNOSIS — R350 Frequency of micturition: Secondary | ICD-10-CM | POA: Diagnosis not present

## 2012-05-08 DIAGNOSIS — I4891 Unspecified atrial fibrillation: Secondary | ICD-10-CM | POA: Diagnosis not present

## 2012-05-08 DIAGNOSIS — I10 Essential (primary) hypertension: Secondary | ICD-10-CM | POA: Diagnosis not present

## 2012-05-08 DIAGNOSIS — I059 Rheumatic mitral valve disease, unspecified: Secondary | ICD-10-CM | POA: Diagnosis not present

## 2012-05-29 DIAGNOSIS — Z23 Encounter for immunization: Secondary | ICD-10-CM | POA: Diagnosis not present

## 2012-07-15 DIAGNOSIS — Z23 Encounter for immunization: Secondary | ICD-10-CM | POA: Diagnosis not present

## 2012-07-28 DIAGNOSIS — Z79899 Other long term (current) drug therapy: Secondary | ICD-10-CM | POA: Diagnosis not present

## 2012-07-28 DIAGNOSIS — D539 Nutritional anemia, unspecified: Secondary | ICD-10-CM | POA: Diagnosis not present

## 2012-07-28 DIAGNOSIS — I10 Essential (primary) hypertension: Secondary | ICD-10-CM | POA: Diagnosis not present

## 2012-07-28 DIAGNOSIS — F329 Major depressive disorder, single episode, unspecified: Secondary | ICD-10-CM | POA: Diagnosis not present

## 2012-07-28 DIAGNOSIS — H6120 Impacted cerumen, unspecified ear: Secondary | ICD-10-CM | POA: Diagnosis not present

## 2012-07-28 DIAGNOSIS — Z0000 Encounter for general adult medical examination without abnormal findings: Secondary | ICD-10-CM | POA: Diagnosis not present

## 2012-07-28 DIAGNOSIS — R569 Unspecified convulsions: Secondary | ICD-10-CM | POA: Diagnosis not present

## 2012-08-14 DIAGNOSIS — I10 Essential (primary) hypertension: Secondary | ICD-10-CM | POA: Diagnosis not present

## 2012-08-14 DIAGNOSIS — Z79899 Other long term (current) drug therapy: Secondary | ICD-10-CM | POA: Diagnosis not present

## 2012-08-14 DIAGNOSIS — D539 Nutritional anemia, unspecified: Secondary | ICD-10-CM | POA: Diagnosis not present

## 2012-08-14 DIAGNOSIS — F339 Major depressive disorder, recurrent, unspecified: Secondary | ICD-10-CM | POA: Diagnosis not present

## 2012-08-28 DIAGNOSIS — I10 Essential (primary) hypertension: Secondary | ICD-10-CM | POA: Diagnosis not present

## 2012-08-28 DIAGNOSIS — Z79899 Other long term (current) drug therapy: Secondary | ICD-10-CM | POA: Diagnosis not present

## 2012-08-28 DIAGNOSIS — D539 Nutritional anemia, unspecified: Secondary | ICD-10-CM | POA: Diagnosis not present

## 2012-08-29 DIAGNOSIS — H52209 Unspecified astigmatism, unspecified eye: Secondary | ICD-10-CM | POA: Diagnosis not present

## 2012-08-29 DIAGNOSIS — H2640 Unspecified secondary cataract: Secondary | ICD-10-CM | POA: Diagnosis not present

## 2012-08-29 DIAGNOSIS — Z961 Presence of intraocular lens: Secondary | ICD-10-CM | POA: Diagnosis not present

## 2012-08-29 DIAGNOSIS — H524 Presbyopia: Secondary | ICD-10-CM | POA: Diagnosis not present

## 2012-10-03 DIAGNOSIS — H6120 Impacted cerumen, unspecified ear: Secondary | ICD-10-CM | POA: Diagnosis not present

## 2012-10-03 DIAGNOSIS — IMO0002 Reserved for concepts with insufficient information to code with codable children: Secondary | ICD-10-CM | POA: Diagnosis not present

## 2012-10-14 DIAGNOSIS — H6120 Impacted cerumen, unspecified ear: Secondary | ICD-10-CM | POA: Diagnosis not present

## 2012-11-09 ENCOUNTER — Encounter (HOSPITAL_COMMUNITY): Payer: Self-pay | Admitting: Emergency Medicine

## 2012-11-09 ENCOUNTER — Emergency Department (HOSPITAL_COMMUNITY): Payer: Medicare Other

## 2012-11-09 ENCOUNTER — Inpatient Hospital Stay (HOSPITAL_COMMUNITY)
Admission: EM | Admit: 2012-11-09 | Discharge: 2012-11-12 | DRG: 177 | Disposition: A | Discharge status: Home Health | Payer: Medicare Other | Attending: Internal Medicine | Admitting: Internal Medicine

## 2012-11-09 DIAGNOSIS — J90 Pleural effusion, not elsewhere classified: Secondary | ICD-10-CM | POA: Diagnosis not present

## 2012-11-09 DIAGNOSIS — E871 Hypo-osmolality and hyponatremia: Secondary | ICD-10-CM | POA: Diagnosis not present

## 2012-11-09 DIAGNOSIS — F411 Generalized anxiety disorder: Secondary | ICD-10-CM | POA: Diagnosis present

## 2012-11-09 DIAGNOSIS — R112 Nausea with vomiting, unspecified: Secondary | ICD-10-CM | POA: Diagnosis not present

## 2012-11-09 DIAGNOSIS — J9620 Acute and chronic respiratory failure, unspecified whether with hypoxia or hypercapnia: Secondary | ICD-10-CM | POA: Diagnosis not present

## 2012-11-09 DIAGNOSIS — K529 Noninfective gastroenteritis and colitis, unspecified: Secondary | ICD-10-CM | POA: Diagnosis present

## 2012-11-09 DIAGNOSIS — G20 Parkinson's disease: Secondary | ICD-10-CM | POA: Diagnosis present

## 2012-11-09 DIAGNOSIS — M129 Arthropathy, unspecified: Secondary | ICD-10-CM | POA: Diagnosis present

## 2012-11-09 DIAGNOSIS — I10 Essential (primary) hypertension: Secondary | ICD-10-CM

## 2012-11-09 DIAGNOSIS — R142 Eructation: Secondary | ICD-10-CM | POA: Diagnosis not present

## 2012-11-09 DIAGNOSIS — K219 Gastro-esophageal reflux disease without esophagitis: Secondary | ICD-10-CM | POA: Diagnosis present

## 2012-11-09 DIAGNOSIS — R197 Diarrhea, unspecified: Secondary | ICD-10-CM

## 2012-11-09 DIAGNOSIS — N390 Urinary tract infection, site not specified: Secondary | ICD-10-CM

## 2012-11-09 DIAGNOSIS — G40909 Epilepsy, unspecified, not intractable, without status epilepticus: Secondary | ICD-10-CM | POA: Diagnosis present

## 2012-11-09 DIAGNOSIS — R0602 Shortness of breath: Secondary | ICD-10-CM | POA: Diagnosis not present

## 2012-11-09 DIAGNOSIS — H9190 Unspecified hearing loss, unspecified ear: Secondary | ICD-10-CM | POA: Diagnosis present

## 2012-11-09 DIAGNOSIS — K449 Diaphragmatic hernia without obstruction or gangrene: Secondary | ICD-10-CM | POA: Diagnosis present

## 2012-11-09 DIAGNOSIS — K2990 Gastroduodenitis, unspecified, without bleeding: Secondary | ICD-10-CM | POA: Diagnosis not present

## 2012-11-09 DIAGNOSIS — G250 Essential tremor: Secondary | ICD-10-CM | POA: Diagnosis present

## 2012-11-09 DIAGNOSIS — G252 Other specified forms of tremor: Secondary | ICD-10-CM | POA: Diagnosis present

## 2012-11-09 DIAGNOSIS — J690 Pneumonitis due to inhalation of food and vomit: Principal | ICD-10-CM | POA: Diagnosis present

## 2012-11-09 DIAGNOSIS — J189 Pneumonia, unspecified organism: Secondary | ICD-10-CM | POA: Diagnosis not present

## 2012-11-09 DIAGNOSIS — K5289 Other specified noninfective gastroenteritis and colitis: Secondary | ICD-10-CM | POA: Diagnosis present

## 2012-11-09 DIAGNOSIS — G20A1 Parkinson's disease without dyskinesia, without mention of fluctuations: Secondary | ICD-10-CM | POA: Diagnosis present

## 2012-11-09 DIAGNOSIS — Z85820 Personal history of malignant melanoma of skin: Secondary | ICD-10-CM | POA: Diagnosis not present

## 2012-11-09 DIAGNOSIS — N400 Enlarged prostate without lower urinary tract symptoms: Secondary | ICD-10-CM | POA: Diagnosis present

## 2012-11-09 DIAGNOSIS — R143 Flatulence: Secondary | ICD-10-CM | POA: Diagnosis not present

## 2012-11-09 DIAGNOSIS — I4891 Unspecified atrial fibrillation: Secondary | ICD-10-CM | POA: Diagnosis present

## 2012-11-09 DIAGNOSIS — R109 Unspecified abdominal pain: Secondary | ICD-10-CM | POA: Diagnosis not present

## 2012-11-09 DIAGNOSIS — D649 Anemia, unspecified: Secondary | ICD-10-CM | POA: Diagnosis present

## 2012-11-09 DIAGNOSIS — K2970 Gastritis, unspecified, without bleeding: Secondary | ICD-10-CM | POA: Diagnosis not present

## 2012-11-09 LAB — CBC WITH DIFFERENTIAL/PLATELET
Basophils Absolute: 0 10*3/uL (ref 0.0–0.1)
Basophils Relative: 0 % (ref 0–1)
Eosinophils Relative: 0 % (ref 0–5)
HCT: 39.7 % (ref 39.0–52.0)
Lymphocytes Relative: 15 % (ref 12–46)
MCHC: 36 g/dL (ref 30.0–36.0)
MCV: 86.1 fL (ref 78.0–100.0)
Monocytes Absolute: 1.1 10*3/uL — ABNORMAL HIGH (ref 0.1–1.0)
Platelets: 198 10*3/uL (ref 150–400)
RDW: 16.7 % — ABNORMAL HIGH (ref 11.5–15.5)
WBC: 13.2 10*3/uL — ABNORMAL HIGH (ref 4.0–10.5)

## 2012-11-09 LAB — COMPREHENSIVE METABOLIC PANEL
ALT: 21 U/L (ref 0–53)
AST: 27 U/L (ref 0–37)
Albumin: 3.8 g/dL (ref 3.5–5.2)
CO2: 19 mEq/L (ref 19–32)
Calcium: 9.2 mg/dL (ref 8.4–10.5)
Creatinine, Ser: 1.09 mg/dL (ref 0.50–1.35)
GFR calc non Af Amer: 59 mL/min — ABNORMAL LOW (ref >90)
Sodium: 126 mEq/L — ABNORMAL LOW (ref 135–145)
Total Protein: 7.4 g/dL (ref 6.0–8.3)

## 2012-11-09 LAB — POCT I-STAT TROPONIN I: Troponin i, poc: 0.01 ng/mL (ref 0.00–0.08)

## 2012-11-09 LAB — OCCULT BLOOD, POC DEVICE: Fecal Occult Bld: POSITIVE — AB

## 2012-11-09 MED ORDER — VANCOMYCIN HCL IN DEXTROSE 1-5 GM/200ML-% IV SOLN
1000.0000 mg | Freq: Once | INTRAVENOUS | Status: AC
  Administered 2012-11-10: 1000 mg via INTRAVENOUS
  Filled 2012-11-09: qty 200

## 2012-11-09 MED ORDER — PANTOPRAZOLE SODIUM 40 MG IV SOLR
40.0000 mg | Freq: Once | INTRAVENOUS | Status: AC
  Administered 2012-11-09: 40 mg via INTRAVENOUS
  Filled 2012-11-09: qty 40

## 2012-11-09 MED ORDER — SODIUM CHLORIDE 0.9 % IV BOLUS (SEPSIS)
500.0000 mL | Freq: Once | INTRAVENOUS | Status: AC
  Administered 2012-11-09: 500 mL via INTRAVENOUS

## 2012-11-09 MED ORDER — FENTANYL CITRATE 0.05 MG/ML IJ SOLN
50.0000 ug | Freq: Once | INTRAMUSCULAR | Status: AC
  Administered 2012-11-09: 50 ug via INTRAVENOUS
  Filled 2012-11-09: qty 2

## 2012-11-09 MED ORDER — ONDANSETRON HCL 4 MG/2ML IJ SOLN
4.0000 mg | Freq: Once | INTRAMUSCULAR | Status: AC
  Administered 2012-11-09: 4 mg via INTRAVENOUS
  Filled 2012-11-09: qty 2

## 2012-11-09 MED ORDER — IOHEXOL 300 MG/ML  SOLN
50.0000 mL | Freq: Once | INTRAMUSCULAR | Status: AC | PRN
  Administered 2012-11-09: 50 mL via ORAL

## 2012-11-09 MED ORDER — PIPERACILLIN-TAZOBACTAM 3.375 G IVPB
3.3750 g | Freq: Once | INTRAVENOUS | Status: AC
  Administered 2012-11-09: 3.375 g via INTRAVENOUS
  Filled 2012-11-09: qty 50

## 2012-11-09 NOTE — ED Notes (Signed)
Lab drawing blood

## 2012-11-09 NOTE — ED Provider Notes (Signed)
Patient reports emesis diarrhea abdominal pain over the past 3-4 days. He denies cough denies shortness of breath. On exam chronically ill-appearing alert nontoxic mucous membranes dry lungs clear auscultation abdomen mild diffuse tenderness obese normal bowel sounds genitalia normal male extremities without edema.  Doug Sou, MD 11/09/12 2213

## 2012-11-09 NOTE — ED Notes (Signed)
Patient has DNR; placed in patient's chart.

## 2012-11-09 NOTE — ED Notes (Signed)
Pt has finished drinking 1 cup of contrast. CT informed.

## 2012-11-09 NOTE — ED Notes (Signed)
Daughter's name is Sheliah Hatch [phone].  Call home first and leave message with room number if he's being moved to a room per daughter.

## 2012-11-09 NOTE — ED Provider Notes (Signed)
History     CSN: [id number]  Arrival date & time 11/09/12  2043   First MD Initiated Contact with Patient 11/09/12 2048      Chief Complaint  Patient presents with  . Abdominal Pain  . Emesis    (Consider location/radiation/quality/duration/timing/severity/associated sxs/prior treatment) HPI History provided by pt and his daughter.  Per patient's daughter, patient resides at [facility].  She was contacted today because he has had several episodes of emesis and diarrhea since last night, and has complained of diffuse abdominal pain.  Pt denies fever, CP and SOB, and urinary sx, but reports that his stool appeared black.  EMS noted his emesis to be dark brown in color.  No known sick contacts.  No recent medication changes.  Past abd surgeries include appendectomy.    Past Medical History  Diagnosis Date  . Hypertension   . Seizures   . A-fib   . Dehydration   . Deafness   . Parkinson's disease   . Anxiety   . Reflux   . Arthritis   . Depression   . Atrial fibrillation   . Parkinson disease   . Tremor, essential   . Seizure   . BPH (benign prostatic hyperplasia)   . Syncope   . Deaf   . Melanoma   . GERD (gastroesophageal reflux disease)     Past Surgical History  Procedure Laterality Date  . Unknown    . Appendectomy    . Cataract extraction w/ intraocular lens  implant, bilateral      History reviewed. No pertinent family history.  History  Substance Use Topics  . Smoking status: Never Smoker   . Smokeless tobacco: Not on file  . Alcohol Use: No      Review of Systems  All other systems reviewed and are negative.    Allergies  Topamax; Uroxatral; and Valium  Home Medications   Current Outpatient Rx  Name  Route  Sig  Dispense  Refill  . acetaminophen (TYLENOL) 500 MG tablet   Oral   Take 500 mg by mouth every 6 (six) hours as needed. For pain         . acetaminophen (TYLENOL) 500 MG tablet   Oral   Take 500 mg by mouth every 6 (six)  hours as needed.         Marland Kitchen amLODipine-benazepril (LOTREL) 10-40 MG per capsule   Oral   Take 1 capsule by mouth daily.           Marland Kitchen aspirin 81 MG chewable tablet   Oral   Chew 81 mg by mouth every evening.          Marland Kitchen aspirin 81 MG tablet   Oral   Take 81 mg by mouth daily.         . cloNIDine (CATAPRES) 0.1 MG tablet   Oral   Take 1 tablet (0.1 mg total) by mouth 3 (three) times daily.   60 tablet   0   . cloNIDine (CATAPRES) 0.1 MG tablet   Oral   Take 0.1 mg by mouth 2 (two) times daily.         Marland Kitchen HYDROcodone-acetaminophen (NORCO) 5-325 MG per tablet   Oral   Take 1 tablet by mouth every 6 (six) hours as needed.         Marland Kitchen HYDROcodone-acetaminophen (VICODIN) 5-500 MG per tablet   Oral   Take 1 tablet by mouth every 6 (six) hours as needed. For pain  20 tablet   0   . labetalol (NORMODYNE) 100 MG tablet   Oral   Take 100 mg by mouth 2 (two) times daily.           Marland Kitchen labetalol (NORMODYNE) 100 MG tablet   Oral   Take 100 mg by mouth 2 (two) times daily.         . Lacosamide (VIMPAT) 150 MG TABS   Oral   Take 1 tablet by mouth daily.          Marland Kitchen levETIRAcetam (KEPPRA) 100 MG/ML solution   Oral   Take 10 mg/kg by mouth 2 (two) times daily.         Marland Kitchen levETIRAcetam (KEPPRA) 750 MG tablet   Oral   Take 750 mg by mouth 2 (two) times daily.          Marland Kitchen LORazepam (ATIVAN) 0.5 MG tablet   Oral   Take 1 tablet (0.5 mg total) by mouth at bedtime as needed. For anxiety   20 tablet   0   . LORazepam (ATIVAN) 0.5 MG tablet   Oral   Take 0.5 mg by mouth every 8 (eight) hours.         . Melatonin 3 MG CAPS   Oral   Take by mouth.         . Melatonin 5 MG TABS   Oral   Take 1 tablet by mouth at bedtime as needed.           Marland Kitchen omeprazole (PRILOSEC) 20 MG capsule   Oral   Take 20 mg by mouth daily.         Marland Kitchen EXPIRED: pantoprazole (PROTONIX) 40 MG tablet   Oral   Take 1 tablet (40 mg total) by mouth 2 (two) times daily before a meal.    30 tablet   0   . polyethylene glycol (MIRALAX / GLYCOLAX) packet   Oral   Take 17 g by mouth daily.         . polyethylene glycol powder (GLYCOLAX/MIRALAX) powder   Oral   Take 17 g by mouth daily.            BP 153/76  Pulse 99  Temp(Src) 98.5 F (36.9 C) (Oral)  Resp 16  SpO2 94%  Physical Exam  Nursing note and vitals reviewed. Constitutional: He is oriented to person, place, and time. He appears well-developed and well-nourished. No distress.  HENT:  Head: Normocephalic and atraumatic.  Eyes:  Normal appearance  Neck: Normal range of motion.  Cardiovascular: Normal rate, regular rhythm and intact distal pulses.   Pulmonary/Chest: Effort normal and breath sounds normal. No respiratory distress.  Pt on 4L [place].  Not on home O2.  Sats dropped from 96% to upper 80s when he sat up in bed.  His daughter denies recent dypsnea or complaint of CP.  She is with him on a daily basis.   Abdominal: Soft. Bowel sounds are normal. He exhibits distension. He exhibits no mass. There is no rebound.  Pt reports diffuse tenderness but no grimacing or guarding.    Genitourinary:  No CVA tenderness.  No external hemorrhoids.  Nml rectal tone.  No tenderness on DRE.  Stool color nml and no gross blood.    Musculoskeletal: Normal range of motion.  Trace, symmetric non-pitting LE edema  Neurological: He is alert and oriented to person, place, and time.  Skin: Skin is warm and dry. No rash noted.  Psychiatric: He has  a normal mood and affect. His behavior is normal.    ED Course  Procedures (including critical care time)  Labs Reviewed  CBC WITH DIFFERENTIAL - Abnormal; Notable for the following:    WBC 13.2 (*)    RDW 16.7 (*)    Neutro Abs 10.2 (*)    Monocytes Absolute 1.1 (*)    All other components within normal limits  COMPREHENSIVE METABOLIC PANEL - Abnormal; Notable for the following:    Sodium 126 (*)    Chloride 94 (*)    Glucose, Bld 125 (*)    GFR calc non Af Amer 59  (*)    GFR calc Af Amer 68 (*)    All other components within normal limits  URINALYSIS, ROUTINE W REFLEX MICROSCOPIC - Abnormal; Notable for the following:    APPearance CLOUDY (*)    Leukocytes, UA MODERATE (*)    All other components within normal limits  URINE MICROSCOPIC-ADD ON - Abnormal; Notable for the following:    Bacteria, UA FEW (*)    Casts HYALINE CASTS (*)    All other components within normal limits  CBC WITH DIFFERENTIAL - Abnormal; Notable for the following:    HCT 37.5 (*)    RDW 16.6 (*)    All other components within normal limits  COMPREHENSIVE METABOLIC PANEL - Abnormal; Notable for the following:    Sodium 130 (*)    GFR calc non Af Amer 73 (*)    GFR calc Af Amer 84 (*)    All other components within normal limits  OCCULT BLOOD, POC DEVICE - Abnormal; Notable for the following:    Fecal Occult Bld POSITIVE (*)    All other components within normal limits  URINE CULTURE  CULTURE, BLOOD (ROUTINE X 2)  CULTURE, BLOOD (ROUTINE X 2)  CULTURE, EXPECTORATED SPUTUM-ASSESSMENT  GRAM STAIN  STOOL CULTURE  CLOSTRIDIUM DIFFICILE BY PCR  MRSA PCR SCREENING  LIPASE, BLOOD  LACTIC ACID, PLASMA  HIV ANTIBODY (ROUTINE TESTING)  LEGIONELLA ANTIGEN, URINE  STREP PNEUMONIAE URINARY ANTIGEN  FECAL LACTOFERRIN  TROPONIN I  TROPONIN I  TROPONIN I  BASIC METABOLIC PANEL  PRO B NATRIURETIC PEPTIDE  POCT I-STAT TROPONIN I  TYPE AND SCREEN  ABO/RH   Dg Chest 2 View  11/09/2012  *RADIOLOGY REPORT*  Clinical Data: Shortness of breath.  CHEST - 2 VIEW  Comparison: CT chest 07/19/2011.  Chest 07/17/2011.  Findings: Shallow inspiration.  There is interval development of parenchymal airspace disease in both mid and lower lungs, greater on the left.  Changes suggest edema versus pneumonia.  The heart size is prominent.  Pulmonary vascularity is obscured by the parenchymal process.  No blunting of costophrenic angles.  No pneumothorax.  Calcification of the aorta.  Esophageal  hiatal hernia behind the heart.  Degenerative changes in the thoracic spine with mild thoracic kyphosis.  IMPRESSION: Bilateral airspace disease, greater on the left suggesting edema or pneumonia.  Mild cardiac enlargement.  Esophageal hiatal hernia behind the heart.   Original Report Authenticated By: Burman Nieves, M.D.    Ct Abdomen Pelvis W Contrast  11/10/2012  *RADIOLOGY REPORT*  Clinical Data: Nausea, vomiting, abdominal pain, and distension.  CT ABDOMEN AND PELVIS WITH CONTRAST  Technique:  Multidetector CT imaging of the abdomen and pelvis was performed following the standard protocol during bolus administration of intravenous contrast.  Contrast: OMNIPAQUE IOHEXOL 300 MG/ML  SOLN  Comparison: None.  Findings: There is patchy infiltration in both lung bases suggesting possible pneumonia.  Large esophageal hiatal hernia containing most of the stomach.  The liver, spleen, gallbladder, adrenal glands, and retroperitoneal lymph nodes are unremarkable.  The pancreas is atrophic. Calcification of the abdominal aorta without aneurysm.  There is a focal perfusion defect in the lower pole of the left kidney which may represent a small cyst or localized infarct.  No solid mass or hydronephrosis in either kidney.  The small bowel are decompressed. The colon is mildly distended and gas filled diffusely with liquid stool present.  This may represent ileus or infection.  No significant colonic wall thickening.  No free air or free fluid in the abdomen.  Surgical clips in the anterior abdominal wall.  Pelvis:  Prostate gland is prominent, measuring 5.1 x 3.8 cm. Bladder wall is not thickened.  No free or loculated pelvic fluid collections.  No significant pelvic lymphadenopathy.  The appendix is surgically absent by history.  Degenerative changes in the lumbar spine.  Schmorl's nodes are present.  Degenerative versus postoperative changes in the facet joints.  IMPRESSION: Large esophageal hiatal hernia  containing most of the stomach. Mild prominence of gas and fluid filled colon suggesting ileus versus infection.  Infiltration in both lung bases. Lesion in the lower pole of the left kidney probably representing a cyst or possibly perfusion defect.   Original Report Authenticated By: Burman Nieves, M.D.      1. HCAP (healthcare-associated pneumonia)   2. Nausea vomiting and diarrhea   3. UTI (lower urinary tract infection)   4. Hyponatremia       MDM  79yo M from nursing home presents w/ diffuse abd pain, N/V/D since last night.  On exam, NAD, afebrile and hemodynamically stable, hypoxic w/ O2 in mid-80s on rm air and with position changes on 4L [place], no respiratory distress, lungs clear, abd distended and reportedly, diffusely tender, mild, symmetric bilateral peripheral edema.  CXR shows edema vs pneumonia left lung base.  Pt has not had CP/SOB but has possibly had a cough.  Will treat w/ vanc and zosyn for possible HCAP w/ hypoxia.  Labs sig for hyponatremia (daughter reports prior history), mild leukocytosis, neg troponin.  Pt received IVF, fentanyl and zofran.  No active vomiting in ED and reports sx improvement.  CT abd/pelvis to r/o SBO is pending.  11:00 PM   Pt actively vomiting and emesis dark brown.  Hemoccult positive.  4mg  zofran and 40mg  protonix ordered.  Pt otherwise stable.  11:09 PM   CT shows ileus vs. Infectious process.  U/A positive for infection.  Results discussed w/ pt.  [facility] consulted for admission.         Otilio Miu, PA-C 11/10/12 [phone]

## 2012-11-09 NOTE — ED Notes (Signed)
Per EMS, patient is from [facility].  Patient complaining of lower quadrant pain with nausea and vomiting x1 day.  Patient has some abdominal distension; patient reports that this is normal for him.  Currently rates pain 7/10 on the numerical pain scale; staff reports the emesis was brown in color.  Denies diarrhea.

## 2012-11-09 NOTE — ED Notes (Signed)
Barry Dean, [place] states she does not want blood cultures drawn at this time. Antibiotics started at this time.

## 2012-11-10 ENCOUNTER — Encounter (HOSPITAL_COMMUNITY): Payer: Self-pay | Admitting: *Deleted

## 2012-11-10 ENCOUNTER — Emergency Department (HOSPITAL_COMMUNITY): Payer: Medicare Other

## 2012-11-10 DIAGNOSIS — R141 Gas pain: Secondary | ICD-10-CM | POA: Diagnosis not present

## 2012-11-10 DIAGNOSIS — J189 Pneumonia, unspecified organism: Secondary | ICD-10-CM

## 2012-11-10 DIAGNOSIS — R109 Unspecified abdominal pain: Secondary | ICD-10-CM | POA: Diagnosis not present

## 2012-11-10 DIAGNOSIS — R112 Nausea with vomiting, unspecified: Secondary | ICD-10-CM | POA: Diagnosis not present

## 2012-11-10 LAB — CBC WITH DIFFERENTIAL/PLATELET
Basophils Absolute: 0 10*3/uL (ref 0.0–0.1)
HCT: 37.5 % — ABNORMAL LOW (ref 39.0–52.0)
Hemoglobin: 13.2 g/dL (ref 13.0–17.0)
Lymphocytes Relative: 23 % (ref 12–46)
Lymphs Abs: 2 10*3/uL (ref 0.7–4.0)
Monocytes Absolute: 1 10*3/uL (ref 0.1–1.0)
Monocytes Relative: 11 % (ref 3–12)
Neutro Abs: 5.4 10*3/uL (ref 1.7–7.7)
RBC: 4.46 MIL/uL (ref 4.22–5.81)
RDW: 16.6 % — ABNORMAL HIGH (ref 11.5–15.5)
WBC: 8.4 10*3/uL (ref 4.0–10.5)

## 2012-11-10 LAB — COMPREHENSIVE METABOLIC PANEL
ALT: 18 U/L (ref 0–53)
AST: 22 U/L (ref 0–37)
CO2: 23 mEq/L (ref 19–32)
Chloride: 99 mEq/L (ref 96–112)
Creatinine, Ser: 0.95 mg/dL (ref 0.50–1.35)
GFR calc non Af Amer: 73 mL/min — ABNORMAL LOW (ref >90)
Glucose, Bld: 91 mg/dL (ref 70–99)
Total Bilirubin: 0.6 mg/dL (ref 0.3–1.2)

## 2012-11-10 LAB — BASIC METABOLIC PANEL
Calcium: 8.6 mg/dL (ref 8.4–10.5)
GFR calc Af Amer: 88 mL/min — ABNORMAL LOW (ref >90)
GFR calc non Af Amer: 76 mL/min — ABNORMAL LOW (ref >90)
Potassium: 4.1 mEq/L (ref 3.5–5.1)
Sodium: 128 mEq/L — ABNORMAL LOW (ref 135–145)

## 2012-11-10 LAB — PRO B NATRIURETIC PEPTIDE: Pro B Natriuretic peptide (BNP): 113.2 pg/mL (ref 0–450)

## 2012-11-10 LAB — URINALYSIS, ROUTINE W REFLEX MICROSCOPIC
Glucose, UA: NEGATIVE mg/dL
Hgb urine dipstick: NEGATIVE
Protein, ur: NEGATIVE mg/dL
Specific Gravity, Urine: 1.027 (ref 1.005–1.030)
pH: 5 (ref 5.0–8.0)

## 2012-11-10 LAB — URINE MICROSCOPIC-ADD ON

## 2012-11-10 LAB — STREP PNEUMONIAE URINARY ANTIGEN: Strep Pneumo Urinary Antigen: NEGATIVE

## 2012-11-10 LAB — TROPONIN I: Troponin I: <0.3 ng/mL (ref <0.30)

## 2012-11-10 LAB — TYPE AND SCREEN: ABO/RH(D): A POS

## 2012-11-10 LAB — ABO/RH: ABO/RH(D): A POS

## 2012-11-10 MED ORDER — ONDANSETRON HCL 4 MG/2ML IJ SOLN: 4.0000 mg | Freq: Four times a day (QID) | INTRAMUSCULAR | Status: DC | PRN

## 2012-11-10 MED ORDER — FERROUS SULFATE 325 (65 FE) MG PO TABS
325.0000 mg | ORAL_TABLET | Freq: Every day | ORAL | Status: DC
  Administered 2012-11-10 – 2012-11-12 (×3): 325 mg via ORAL
  Filled 2012-11-10 (×4): qty 1

## 2012-11-10 MED ORDER — MELATONIN 3 MG PO CAPS: 6.0000 mg | ORAL_CAPSULE | Freq: Every evening | ORAL | Status: DC | PRN

## 2012-11-10 MED ORDER — SODIUM CHLORIDE 0.9 % IV SOLN: INTRAVENOUS | Status: AC

## 2012-11-10 MED ORDER — LABETALOL HCL 100 MG PO TABS
100.0000 mg | ORAL_TABLET | Freq: Two times a day (BID) | ORAL | Status: DC
  Administered 2012-11-10 – 2012-11-12 (×5): 100 mg via ORAL
  Filled 2012-11-10 (×6): qty 1

## 2012-11-10 MED ORDER — BENAZEPRIL HCL 40 MG PO TABS
40.0000 mg | ORAL_TABLET | Freq: Every day | ORAL | Status: DC
Start: 2012-11-10 — End: 2012-11-10
  Administered 2012-11-10: 40 mg via ORAL
  Filled 2012-11-10: qty 1

## 2012-11-10 MED ORDER — AMLODIPINE BESYLATE 10 MG PO TABS
10.0000 mg | ORAL_TABLET | Freq: Every day | ORAL | Status: DC
  Administered 2012-11-10 – 2012-11-12 (×3): 10 mg via ORAL
  Filled 2012-11-10 (×3): qty 1

## 2012-11-10 MED ORDER — AMLODIPINE BESY-BENAZEPRIL HCL 10-40 MG PO CAPS: 1.0000 | ORAL_CAPSULE | Freq: Every day | ORAL | Status: DC

## 2012-11-10 MED ORDER — VANCOMYCIN HCL 10 G IV SOLR
1250.0000 mg | Freq: Two times a day (BID) | INTRAVENOUS | Status: DC
  Administered 2012-11-10 – 2012-11-12 (×5): 1250 mg via INTRAVENOUS
  Filled 2012-11-10 (×8): qty 1250

## 2012-11-10 MED ORDER — LEVETIRACETAM 750 MG PO TABS
750.0000 mg | ORAL_TABLET | Freq: Two times a day (BID) | ORAL | Status: DC
  Administered 2012-11-10 – 2012-11-12 (×5): 750 mg via ORAL
  Filled 2012-11-10 (×6): qty 1

## 2012-11-10 MED ORDER — LEVOFLOXACIN IN D5W 750 MG/150ML IV SOLN
750.0000 mg | INTRAVENOUS | Status: AC
  Administered 2012-11-10 – 2012-11-12 (×3): 750 mg via INTRAVENOUS
  Filled 2012-11-10 (×4): qty 150

## 2012-11-10 MED ORDER — SODIUM CHLORIDE 0.9 % IV SOLN
INTRAVENOUS | Status: DC
  Administered 2012-11-10: 1000 mL via INTRAVENOUS
  Administered 2012-11-10 – 2012-11-11 (×3): via INTRAVENOUS

## 2012-11-10 MED ORDER — FAMOTIDINE 40 MG PO TABS
40.0000 mg | ORAL_TABLET | Freq: Two times a day (BID) | ORAL | Status: DC
  Administered 2012-11-10 (×2): 40 mg via ORAL
  Filled 2012-11-10 (×4): qty 1

## 2012-11-10 MED ORDER — MORPHINE SULFATE 4 MG/ML IJ SOLN: 4.0000 mg | Freq: Once | INTRAMUSCULAR | Status: DC

## 2012-11-10 MED ORDER — LACOSAMIDE 50 MG PO TABS
150.0000 mg | ORAL_TABLET | Freq: Every day | ORAL | Status: DC
  Administered 2012-11-10 – 2012-11-11 (×2): 150 mg via ORAL
  Filled 2012-11-10: qty 3
  Filled 2012-11-10: qty 1
  Filled 2012-11-10: qty 2

## 2012-11-10 MED ORDER — POLYETHYLENE GLYCOL 3350 17 G PO PACK
17.0000 g | PACK | ORAL | Status: DC
  Filled 2012-11-10: qty 1

## 2012-11-10 MED ORDER — IOHEXOL 300 MG/ML  SOLN
100.0000 mL | Freq: Once | INTRAMUSCULAR | Status: AC | PRN
  Administered 2012-11-10: 100 mL via INTRAVENOUS

## 2012-11-10 MED ORDER — PANTOPRAZOLE SODIUM 40 MG PO TBEC
40.0000 mg | DELAYED_RELEASE_TABLET | Freq: Every day | ORAL | Status: DC
  Administered 2012-11-10: 40 mg via ORAL
  Filled 2012-11-10: qty 1

## 2012-11-10 MED ORDER — ONDANSETRON HCL 4 MG/2ML IJ SOLN: 4.0000 mg | Freq: Three times a day (TID) | INTRAMUSCULAR | Status: DC | PRN

## 2012-11-10 MED ORDER — ASPIRIN 81 MG PO CHEW: 81.0000 mg | CHEWABLE_TABLET | Freq: Every evening | ORAL | Status: DC

## 2012-11-10 MED ORDER — CLONIDINE HCL 0.1 MG PO TABS
0.1000 mg | ORAL_TABLET | Freq: Two times a day (BID) | ORAL | Status: DC
  Administered 2012-11-10 – 2012-11-12 (×5): 0.1 mg via ORAL
  Filled 2012-11-10 (×6): qty 1

## 2012-11-10 MED ORDER — SODIUM CHLORIDE 0.9 % IV BOLUS (SEPSIS): 1000.0000 mL | Freq: Once | INTRAVENOUS | Status: DC

## 2012-11-10 NOTE — Progress Notes (Signed)
Received pt from 3300,Patient awake,alert,oriented to person,and place,disoriented to time,Pupil sluggish,speech is clear,able to follow command,Pt has bilateral hearing aid,still very hard of hearing.MOEX4,against resistance.Pt is pleasant and cooperative.

## 2012-11-10 NOTE — Progress Notes (Signed)
PHARMACIST - PHYSICIAN ORDER COMMUNICATION  CONCERNING: P&T Medication Policy on Herbal Medications  DESCRIPTION:  This patient's order for: melatonin 6mg    has been noted.  This product(s) is classified as an "herbal" or natural product. Due to a lack of definitive safety studies or FDA approval, nonstandard manufacturing practices, plus the potential risk of unknown drug-drug interactions while on inpatient medications, the Pharmacy and Therapeutics Committee does not permit the use of "herbal" or natural products of this type within [facility]   ACTION TAKEN: The pharmacy department is unable to verify this order at this time and your patient has been informed of this safety policy. Please reevaluate patient's clinical condition at discharge and address if the herbal or natural product(s) should be resumed at that time.  Janice Coffin

## 2012-11-10 NOTE — Progress Notes (Addendum)
[facility] Progress Note [place] TEAM 1 - Stepdown/ICU TEAM   MAYJOR AGER ZOX:[id number] DOB: [birth year] DOA: 11/09/2012 PCP: No primary provider on file.  Brief narrative: 79 y.o. year old male brought to the ED for nausea vomiting diarrhea x3 days. Patient is a resident of a nursing home.  Patient also reported generalized abdominal pain over the same time frame. Patient reported multiple sick contacts with similar symptoms. While history was limited, patient denied any fevers or chills. Patient had some mild shortness of breath. No wheezing. No trouble urinating. No bloody stools.   In the ER, patient was noted to have white blood cell count 13.2, bilateral pulmonary infiltrates on chest x-ray as well as colonic ileus versus infection on CT scan. Patient also noted to be Hemoccult positive. Initially hypoxic in mid 80s. This improved with supplemental O2. No prior hx/o obstructive lung disease.   Assessment/Plan:  HCAP vs/ aspiration PNA Likely aspiration (due to vomiting, not dysphagia) - low threshold to d/c abx for this indication   Guaiac + stool - normocytic anemia baseline Hgb dating back to Nov'12 is ~11.3  mildly distended and gas filled colon  Noted on CT abdom - ileus or infection per Radiology differential - stool for C diff pending - continue isolation  + UA / UTI Empiric abx - f/u cx data   Large hiatal hernia Noted on CT of abdom  Sz D/O  HTN  Chronic hyponatremia Baseline Na appears to be ~129-131 as per WJX'91 labs  Parkinson's Disease  Afib Reate controlled - not an appropriate candidate for anticoag due to ?GIB as well as high fall risk  Code Status: NO CODE BLUE Family Communication: spoke w/ daughter at bedside Disposition Plan: SDU  Consultants: none  Procedures: none  Antibiotics: Zosyn 3/17 >> Levaquin 3/17 >> Vanc 3/17 >>  DVT prophylaxis: SCDs  HPI/Subjective: Pt seen for a f/u visit  Objective: Blood pressure  156/69, pulse 82, temperature 98 F (36.7 C), temperature source Oral, resp. rate 31, height 6' (1.829 m), weight 102.7 kg (226 lb 6.6 oz), SpO2 94.00%.  Intake/Output Summary (Last 24 hours) at 11/10/12 1053 Last data filed at 11/10/12 1000  Gross per 24 hour  Intake   1400 ml  Output    100 ml  Net   1300 ml    Exam: F/U exam completed  Data Reviewed: Basic Metabolic Panel:  Recent Labs Lab 11/09/12 2133 11/10/12 0425  NA 126* 130*  K 4.1 3.8  CL 94* 99  CO2 19 23  GLUCOSE 125* 91  BUN 21 16  CREATININE 1.09 0.95  CALCIUM 9.2 8.6   Liver Function Tests:  Recent Labs Lab 11/09/12 2133 11/10/12 0425  AST 27 22  ALT 21 18  ALKPHOS 78 70  BILITOT 0.5 0.6  PROT 7.4 6.7  ALBUMIN 3.8 3.5    Recent Labs Lab 11/09/12 2133  LIPASE 26   CBC:  Recent Labs Lab 11/09/12 2133 11/10/12 0425  WBC 13.2* 8.4  NEUTROABS 10.2* 5.4  HGB 14.3 13.2  HCT 39.7 37.5*  MCV 86.1 84.1  PLT 198 192   Cardiac Enzymes:  Recent Labs Lab 11/10/12 0640  TROPONINI <0.30   BNP (last 3 results)  Recent Labs  11/10/12 0640  PROBNP 113.2    Recent Results (from the past 240 hour(s))  MRSA PCR SCREENING     Status: None   Collection Time    11/10/12  5:29 AM      Result Value  Range Status   MRSA by PCR NEGATIVE  NEGATIVE Final   Comment:            The GeneXpert MRSA Assay (FDA     approved for NASAL specimens     only), is one component of a     comprehensive MRSA colonization     surveillance program. It is not     intended to diagnose MRSA     infection nor to guide or     monitor treatment for     MRSA infections.     Studies:  Recent x-ray studies have been reviewed in detail by the Attending Physician  Scheduled Meds:  Scheduled Meds: . sodium chloride   Intravenous STAT  . amLODipine  10 mg Oral Daily  . benazepril  40 mg Oral Daily  . cloNIDine  0.1 mg Oral BID  . ferrous sulfate  325 mg Oral Daily  . labetalol  100 mg Oral BID  . lacosamide   150 mg Oral QHS  . levETIRAcetam  750 mg Oral BID  . levofloxacin (LEVAQUIN) IV  750 mg Intravenous Q24H  .  morphine injection  4 mg Intravenous Once  . pantoprazole  40 mg Oral Daily  . polyethylene glycol  17 g Oral QODAY  . sodium chloride  1,000 mL Intravenous Once  . vancomycin  1,250 mg Intravenous Q12H   Continuous Infusions: . sodium chloride 100 mL/hr at 11/10/12 1000    Time spent on care of this patient: 25+   [facility]  [facility] Office  [phone] Pager - Text Page per Loretha Stapler as per below:  On-Call/Text Page:      [email or web address]      password [email or web address]  If 7PM-7AM, please contact night-coverage [email or web address] Password [facility] 11/10/2012, 10:53 AM   LOS: 1 day

## 2012-11-10 NOTE — ED Provider Notes (Signed)
Medical screening examination/treatment/procedure(s) were conducted as a shared visit with non-physician practitioner(s) and myself.  I personally evaluated the patient during the encounter  Doug Sou, MD 11/10/12 [phone]

## 2012-11-10 NOTE — Progress Notes (Signed)
Utilization review completed.  

## 2012-11-10 NOTE — Progress Notes (Signed)
ANTIBIOTIC CONSULT NOTE - INITIAL  Pharmacy Consult for vancomycin  Indication: rule out pneumonia  Allergies  Allergen Reactions  . Topamax Other (See Comments)    Unknown reaction  . Uroxatral (Alfuzosin Hydrochloride) Other (See Comments)    Unknown reaction  . Valium Other (See Comments)    Unknown reaction    Patient Measurements: Height: 6' (182.9 cm) Weight: 224 lb 13.9 oz (102 kg) IBW/kg (Calculated) : 77.6 Adjusted Body Weight:   Vital Signs: Temp: 99.7 F (37.6 C) (03/16 2212) Temp src: Rectal (03/16 2212) BP: 156/69 mmHg (03/17 0430) Pulse Rate: 87 (03/17 0430) Intake/Output from previous day: 03/16 0701 - 03/17 0700 In: 500 [I.V.:500] Out: -  Intake/Output from this shift: Total I/O In: 500 [I.V.:500] Out: -   Labs:  Recent Labs  11/09/12 2133 11/10/12 0425  WBC 13.2* 8.4  HGB 14.3 13.2  PLT 198 192  CREATININE 1.09 0.95   Estimated Creatinine Clearance: 67.7 ml/min (by C-G formula based on Cr of 0.95). No results found for this basename: VANCOTROUGH, VANCOPEAK, VANCORANDOM, GENTTROUGH, GENTPEAK, GENTRANDOM, TOBRATROUGH, TOBRAPEAK, TOBRARND, AMIKACINPEAK, AMIKACINTROU, AMIKACIN,  in the last 72 hours   Microbiology: No results found for this or any previous visit (from the past 720 hour(s)).  Medical History: Past Medical History  Diagnosis Date  . Hypertension   . Seizures   . A-fib   . Dehydration   . Deafness   . Parkinson's disease   . Anxiety   . Reflux   . Arthritis   . Depression   . Atrial fibrillation   . Parkinson disease   . Tremor, essential   . Seizure   . BPH (benign prostatic hyperplasia)   . Syncope   . Deaf   . Melanoma   . GERD (gastroesophageal reflux disease)     Medications:  Prescriptions prior to admission  Medication Sig Dispense Refill  . acetaminophen (TYLENOL) 500 MG tablet Take 500 mg by mouth every 6 (six) hours as needed for pain. Not to exceed 3 gms of acetaminophen in 24 hours      .  amLODipine-benazepril (LOTREL) 10-40 MG per capsule Take 1 capsule by mouth daily.       Marland Kitchen aspirin 81 MG chewable tablet Chew 81 mg by mouth every evening.       . cloNIDine (CATAPRES) 0.1 MG tablet Take 0.1 mg by mouth 2 (two) times daily.      Marland Kitchen Emollient (LUBRIDERM ADVANCED THERAPY) LOTN Apply 1 application topically daily. Apply to feet after bathing every day      . ferrous sulfate 325 (65 FE) MG tablet Take 325 mg by mouth daily.      Marland Kitchen labetalol (NORMODYNE) 100 MG tablet Take 100 mg by mouth 2 (two) times daily.       . Lacosamide (VIMPAT) 150 MG TABS Take 150 mg by mouth at bedtime.      . levETIRAcetam (KEPPRA) 750 MG tablet Take 750 mg by mouth 2 (two) times daily.       Marland Kitchen omeprazole (PRILOSEC) 20 MG capsule Take 20 mg by mouth daily.      . polyethylene glycol (MIRALAX / GLYCOLAX) packet Take 17 g by mouth every other day.       . Melatonin 3 MG CAPS Take 6 mg by mouth at bedtime as needed (for sleep).          Assessment: 79 yo resident of [facility] with N/VD x3 days and probable HCAP vanc and zosyn for  empiric cvg   Goal of Therapy:  Vancomycin trough level 15-20 mcg/ml  Plan:  Continue vancomycin for 8 days.  Vanc 1250 mg q12h Trough at 4th dose and F/u cx's  Janice Coffin 11/10/2012,5:52 AM

## 2012-11-10 NOTE — H&P (Addendum)
Hospitalist Admission History and Physical  Patient name: Barry Dean Medical record number: [id number] Date of birth: 04-May-1924 Age: 79 y.o. Gender: male  Primary Care Provider: No primary provider on file.  Chief Complaint: vomiting, diarrhea, HCAP History of Present Illness:This is a 79 y.o. year old male for nausea vomiting diarrhea x3 days. Level V caveats secondary to limited history from patient. Patient is a resident of [facility]. Symptoms began over the weekend. Symptoms have persisted despite nausea medicine in the nursing home. Patient also reports persistence generalized abdominal pain over the same time frame. Patient reports multiple sick contacts with similar symptoms. Patient is brought to the ER because of persistence of symptoms. While history is limited, patient denies any fevers or chills. Patient has had some mild shortness of breath. No wheezing. No trouble urinating. No bloody stools. Baseline history of seizures. Unsure patient has been able to hold down seizure medications. No seizures reported.  In the ER, patient is noted to have white blood cell count 13.2, bilateral pulmonary infiltrates on chest x-ray as well as colonic ileus versus infection on CT scan. Patient also noted to be Hemoccult positive. Initially hypoxic in mid 80s. This improved with supplemental O2. No prior hx/o obstructive lung disease.    Patient Active Problem List  Diagnosis  . HCAP (healthcare-associated pneumonia)  . Seizure disorder  . HTN (hypertension)  . Hyponatremia   Past Medical History: Past Medical History  Diagnosis Date  . Hypertension   . Seizures   . A-fib   . Dehydration   . Deafness   . Parkinson's disease   . Anxiety   . Reflux   . Arthritis   . Depression   . Atrial fibrillation   . Parkinson disease   . Tremor, essential   . Seizure   . BPH (benign prostatic hyperplasia)   . Syncope   . Deaf   . Melanoma   . GERD (gastroesophageal reflux  disease)     Past Surgical History: Past Surgical History  Procedure Laterality Date  . Unknown    . Appendectomy    . Cataract extraction w/ intraocular lens  implant, bilateral      Social History: History   Social History  . Marital Status: Unknown    Spouse Name: N/A    Number of Children: N/A  . Years of Education: N/A   Social History Main Topics  . Smoking status: Never Smoker   . Smokeless tobacco: None  . Alcohol Use: No  . Drug Use: No  . Sexually Active:    Other Topics Concern  . None   Social History Narrative   ** Merged History Encounter **        Family History: History reviewed. No pertinent family history.  Allergies: Allergies  Allergen Reactions  . Topamax Other (See Comments)    Unknown reaction  . Uroxatral (Alfuzosin Hydrochloride) Other (See Comments)    Unknown reaction  . Valium Other (See Comments)    Unknown reaction    Current Facility-Administered Medications  Medication Dose Route Frequency Provider Last Rate Last Dose  . amLODipine-benazepril (LOTREL) 10-40 MG per capsule 1 capsule  1 capsule Oral Daily Doree Albee, MD      . aspirin chewable tablet 81 mg  81 mg Oral QPM Doree Albee, MD      . cloNIDine (CATAPRES) tablet 0.1 mg  0.1 mg Oral BID Doree Albee, MD      . ferrous sulfate tablet 325 mg  325 mg Oral Daily Doree Albee, MD      . labetalol (NORMODYNE) tablet 100 mg  100 mg Oral BID Doree Albee, MD      . Lacosamide TABS 150 mg  150 mg Oral QHS Doree Albee, MD      . levETIRAcetam (KEPPRA) tablet 750 mg  750 mg Oral BID Doree Albee, MD      . levofloxacin (LEVAQUIN) IVPB 750 mg  750 mg Intravenous Q24H Doree Albee, MD      . Melatonin CAPS 6 mg  6 mg Oral QHS PRN Doree Albee, MD      . morphine 4 MG/ML injection 4 mg  4 mg Intravenous Once [employer], PA-C      . pantoprazole (PROTONIX) EC tablet 40 mg  40 mg Oral Daily Doree Albee, MD      . polyethylene glycol (MIRALAX / GLYCOLAX)  packet 17 g  17 g Oral QODAY Doree Albee, MD      . sodium chloride 0.9 % bolus 1,000 mL  1,000 mL Intravenous Once Otilio Miu, PA-C       Current Outpatient Prescriptions  Medication Sig Dispense Refill  . acetaminophen (TYLENOL) 500 MG tablet Take 500 mg by mouth every 6 (six) hours as needed for pain. Not to exceed 3 gms of acetaminophen in 24 hours      . amLODipine-benazepril (LOTREL) 10-40 MG per capsule Take 1 capsule by mouth daily.       Marland Kitchen aspirin 81 MG chewable tablet Chew 81 mg by mouth every evening.       . cloNIDine (CATAPRES) 0.1 MG tablet Take 0.1 mg by mouth 2 (two) times daily.      Marland Kitchen Emollient (LUBRIDERM ADVANCED THERAPY) LOTN Apply 1 application topically daily. Apply to feet after bathing every day      . ferrous sulfate 325 (65 FE) MG tablet Take 325 mg by mouth daily.      Marland Kitchen labetalol (NORMODYNE) 100 MG tablet Take 100 mg by mouth 2 (two) times daily.       . Lacosamide (VIMPAT) 150 MG TABS Take 150 mg by mouth at bedtime.      . levETIRAcetam (KEPPRA) 750 MG tablet Take 750 mg by mouth 2 (two) times daily.       Marland Kitchen omeprazole (PRILOSEC) 20 MG capsule Take 20 mg by mouth daily.      . polyethylene glycol (MIRALAX / GLYCOLAX) packet Take 17 g by mouth every other day.       . Melatonin 3 MG CAPS Take 6 mg by mouth at bedtime as needed (for sleep).        Review Of Systems: 12 point ROS negative except as noted above in HPI.  Physical Exam: Filed Vitals:   11/10/12 0400  BP: 152/78  Pulse: 88  Temp:   Resp: 16    General: in bed, alert, hard of hearing, responsive to commands HEENT: PERRLA and extra ocular movement intact Heart: S1, S2 normal, no murmur, rub or gallop, regular rate and rhythm Lungs: diffuse rales in bases, no wheezes Abdomen: mildly distended, generalized mild abdominal tenderness, + bowel sounds Extremities: extremities normal, atraumatic, no cyanosis or edema Skin:no rashes Neurology: normal without focal findings  Labs and  Imaging: Lab Results  Component Value Date/Time   NA 126* 11/09/2012  9:33 PM   K 4.1 11/09/2012  9:33 PM   CL 94* 11/09/2012  9:33 PM   CO2 19 11/09/2012  9:33 PM  BUN 21 11/09/2012  9:33 PM   CREATININE 1.09 11/09/2012  9:33 PM   GLUCOSE 125* 11/09/2012  9:33 PM   Lab Results  Component Value Date   WBC 13.2* 11/09/2012   HGB 14.3 11/09/2012   HCT 39.7 11/09/2012   MCV 86.1 11/09/2012   PLT 198 11/09/2012   Urinalysis    Component Value Date/Time   COLORURINE YELLOW 11/10/2012 0006   APPEARANCEUR CLOUDY* 11/10/2012 0006   LABSPEC 1.027 11/10/2012 0006   PHURINE 5.0 11/10/2012 0006   GLUCOSEU NEGATIVE 11/10/2012 0006   HGBUR NEGATIVE 11/10/2012 0006   BILIRUBINUR NEGATIVE 11/10/2012 0006   KETONESUR NEGATIVE 11/10/2012 0006   PROTEINUR NEGATIVE 11/10/2012 0006   UROBILINOGEN 0.2 11/10/2012 0006   NITRITE NEGATIVE 11/10/2012 0006   LEUKOCYTESUR MODERATE* 11/10/2012 0006       Dg Chest 2 View  11/09/2012  *RADIOLOGY REPORT*  Clinical Data: Shortness of breath.  CHEST - 2 VIEW  Comparison: CT chest 07/19/2011.  Chest 07/17/2011.  Findings: Shallow inspiration.  There is interval development of parenchymal airspace disease in both mid and lower lungs, greater on the left.  Changes suggest edema versus pneumonia.  The heart size is prominent.  Pulmonary vascularity is obscured by the parenchymal process.  No blunting of costophrenic angles.  No pneumothorax.  Calcification of the aorta.  Esophageal hiatal hernia behind the heart.  Degenerative changes in the thoracic spine with mild thoracic kyphosis.  IMPRESSION: Bilateral airspace disease, greater on the left suggesting edema or pneumonia.  Mild cardiac enlargement.  Esophageal hiatal hernia behind the heart.   Original Report Authenticated By: Burman Nieves, M.D.    Ct Abdomen Pelvis W Contrast  11/10/2012  *RADIOLOGY REPORT*  Clinical Data: Nausea, vomiting, abdominal pain, and distension.  CT ABDOMEN AND PELVIS WITH CONTRAST  Technique:   Multidetector CT imaging of the abdomen and pelvis was performed following the standard protocol during bolus administration of intravenous contrast.  Contrast: OMNIPAQUE IOHEXOL 300 MG/ML  SOLN  Comparison: None.  Findings: There is patchy infiltration in both lung bases suggesting possible pneumonia.  Large esophageal hiatal hernia containing most of the stomach.  The liver, spleen, gallbladder, adrenal glands, and retroperitoneal lymph nodes are unremarkable.  The pancreas is atrophic. Calcification of the abdominal aorta without aneurysm.  There is a focal perfusion defect in the lower pole of the left kidney which may represent a small cyst or localized infarct.  No solid mass or hydronephrosis in either kidney.  The small bowel are decompressed. The colon is mildly distended and gas filled diffusely with liquid stool present.  This may represent ileus or infection.  No significant colonic wall thickening.  No free air or free fluid in the abdomen.  Surgical clips in the anterior abdominal wall.  Pelvis:  Prostate gland is prominent, measuring 5.1 x 3.8 cm. Bladder wall is not thickened.  No free or loculated pelvic fluid collections.  No significant pelvic lymphadenopathy.  The appendix is surgically absent by history.  Degenerative changes in the lumbar spine.  Schmorl's nodes are present.  Degenerative versus postoperative changes in the facet joints.  IMPRESSION: Large esophageal hiatal hernia containing most of the stomach. Mild prominence of gas and fluid filled colon suggesting ileus versus infection.  Infiltration in both lung bases. Lesion in the lower pole of the left kidney probably representing a cyst or possibly perfusion defect.   Original Report Authenticated By: Burman Nieves, M.D.      Assessment and Plan: Raul Del  is a 79 y.o. year old male presenting with nausea, vomiting, HCAP  Infectious disease: Broad differential with respiratory, GI and urinary sources of infection.  Overall hemodynamically stable. Though, patient does technically meet sepsis criteria. Will treat with vancomycin, Zosyn, Levaquin. HCAP coverage. Suspect pneumonia may be secondary to aspiration event given vomiting. Abx should give good broad-spectrum infectious coverage. Will panculture patient. Blood, sputum, urine cultures. Given diarrhea with heme positive stools, there is some concern for infectious colitis. Will check fecal lactoferrin, C. difficile, stool culture. Critical care curb sided given sepsis criteria and age. Agree with the treatment plan thus far.  Respiratory: Healthcare associated pneumonia treatment. Continue supplemental O2 as needed.  Neuro: Baseline seizure disorder continue home antiseizure medications.  Cardiovascular: Blood pressure stable. Continue home medications. Questionable history of A. fib. On rate controlling agent. Likely not anticoagulation candidate. Holding aspirin given Hemoccult positive stools. We'll follow. Mild chest pain per report. Likely GI/Pleuritic in nature from wretching. Point-of-care. troponins normal. Will check EKG and cycle cardiac enzymes. Noted cardiomegaly on chest x-ray. Will check 2-D echo to correlate. Pro BNP pending.   FEN/GI: Hyponatremia. Hypovolemic. Likely secondary to vomiting. Normal saline at maintenance rate. Followup BMET at noon. Noted Hemoccult positive stools in setting of ? infectious colitis. Stool studies pending. Holding antiplatelet medications. Patient typed and screened. Hemoglobin reassuring. Recheck Hemoccult and 12-24 hours. Consider GI referral if Hemoccult positive stools persist. Questionable ileus a CT scan in setting of active infection. Will continue to follow GI status. Antiemetics for nausea.   Prophylaxis: SCDs. PPI Disposition: Pending further evaluation Code Status: Full code       Doree Albee MD  Pager: [phone]

## 2012-11-10 NOTE — Progress Notes (Signed)
Pt had an elevated bp of 177/77. Pt was given scheduled dose of catapress and labetalol. RN will have tech to recheck bp in 1 hour.

## 2012-11-10 NOTE — Progress Notes (Signed)
Report called to Rosanne Ashing, receiving RN on 5500.  Pt transferred to 5523 via wheelchair with all belongings. Called pt's family member, Darl Pikes, with new room number.    Roselie Awkward, RN

## 2012-11-10 NOTE — ED Notes (Signed)
Reconnected patient to monitoring device.

## 2012-11-10 NOTE — Clinical Social Work Psychosocial (Signed)
     Clinical Social Work Department BRIEF PSYCHOSOCIAL ASSESSMENT 11/10/2012  Patient:  MCKOY, BHAKTA A     Account Number:  [id number]     Admit date:  11/09/2012  Clinical Social Worker:  Lourdes Sledge  Date/Time:  11/10/2012 11:57 AM  Referred by:  RN  Date Referred:  11/10/2012 Referred for  ALF Placement   Other Referral:   Interview type:  Patient Other interview type:   CSW also spoke with pt daughter Stuart Mirabile [phone].    PSYCHOSOCIAL DATA Living Status:  FACILITY Admitted from facility:  [facility] Level of care:  Assisted Living Primary support name:  Teresa Lemmerman [phone] Primary support relationship to patient:  CHILD, ADULT Degree of support available:   Pt daughter presents as main contact for pt. Pt daughter actively involved in pt care.    CURRENT CONCERNS Current Concerns  Post-Acute Placement   Other Concerns:    SOCIAL WORK ASSESSMENT / PLAN CSW informed that pt was admitted from [facility].    CSW visited pt room and introduced herself and role. CSW explored pt living situation. Pt confirmes he has been at [facility] for a year and would like to return when medically ready. CSW inquired whether pt would like for CSW to contact any family and consented for CSW to contact his daughter Nayson Traweek [phone]. .    CSW spoke with daughter who confirmed the above. Daughter states she would like for pt to receive additional PT at the facility. CSW inquired whether she would be agreeable to SNF if recommended by PT however daughter states she would prefer for pt to stay at the facility and receive PT in house.    CSW contacted [facility] and spoke to the RN who informed CSW that pt FL2 will need to be faxed to [phone] ATTN: Crystal for review. CSW to complete FL2 and fax to facility.   Assessment/plan status:  Psychosocial Support/Ongoing Assessment of Needs Other assessment/ plan:    Information/referral to community resources:   CSW will provide resources as needed, none requested at this time.    PATIENTS/FAMILYS RESPONSE TO PLAN OF CARE: CSW completed assessment with pt who presents very hard of hearing. During assessment pt was alert, oriented and provided appropriate information. CSW also completed assessment with pt daughter who was appreciative of CSW call.    CSW to follow for dc planning.

## 2012-11-11 ENCOUNTER — Inpatient Hospital Stay (HOSPITAL_COMMUNITY): Payer: Medicare Other

## 2012-11-11 DIAGNOSIS — R197 Diarrhea, unspecified: Secondary | ICD-10-CM | POA: Diagnosis not present

## 2012-11-11 DIAGNOSIS — E871 Hypo-osmolality and hyponatremia: Secondary | ICD-10-CM

## 2012-11-11 DIAGNOSIS — R112 Nausea with vomiting, unspecified: Secondary | ICD-10-CM

## 2012-11-11 DIAGNOSIS — I10 Essential (primary) hypertension: Secondary | ICD-10-CM | POA: Diagnosis not present

## 2012-11-11 DIAGNOSIS — J90 Pleural effusion, not elsewhere classified: Secondary | ICD-10-CM | POA: Diagnosis not present

## 2012-11-11 DIAGNOSIS — J189 Pneumonia, unspecified organism: Secondary | ICD-10-CM | POA: Diagnosis not present

## 2012-11-11 LAB — FERRITIN: Ferritin: 89 ng/mL (ref 22–322)

## 2012-11-11 LAB — CBC
MCV: 85.8 fL (ref 78.0–100.0)
Platelets: 189 10*3/uL (ref 150–400)
RBC: 4.24 MIL/uL (ref 4.22–5.81)
RDW: 15.9 % — ABNORMAL HIGH (ref 11.5–15.5)
WBC: 6.7 10*3/uL (ref 4.0–10.5)

## 2012-11-11 LAB — FOLATE: Folate: 19.9 ng/mL

## 2012-11-11 LAB — BASIC METABOLIC PANEL
CO2: 26 mEq/L (ref 19–32)
Calcium: 8.7 mg/dL (ref 8.4–10.5)
Potassium: 3.9 mEq/L (ref 3.5–5.1)
Sodium: 129 mEq/L — ABNORMAL LOW (ref 135–145)

## 2012-11-11 LAB — URINE CULTURE

## 2012-11-11 LAB — IRON AND TIBC: Iron: 33 ug/dL — ABNORMAL LOW (ref 42–135)

## 2012-11-11 LAB — VITAMIN B12: Vitamin B-12: 366 pg/mL (ref 211–911)

## 2012-11-11 LAB — RETICULOCYTES: Retic Count, Absolute: 63.6 10*3/uL (ref 19.0–186.0)

## 2012-11-11 MED ORDER — SENNOSIDES-DOCUSATE SODIUM 8.6-50 MG PO TABS
2.0000 | ORAL_TABLET | Freq: Once | ORAL | Status: AC
  Administered 2012-11-11: 2 via ORAL
  Filled 2012-11-11: qty 2

## 2012-11-11 MED ORDER — PANTOPRAZOLE SODIUM 40 MG PO TBEC
40.0000 mg | DELAYED_RELEASE_TABLET | Freq: Every day | ORAL | Status: DC
  Administered 2012-11-11 – 2012-11-12 (×2): 40 mg via ORAL
  Filled 2012-11-11 (×2): qty 1

## 2012-11-11 MED ORDER — DOCUSATE SODIUM 100 MG PO CAPS
100.0000 mg | ORAL_CAPSULE | Freq: Two times a day (BID) | ORAL | Status: DC
Start: 2012-11-11 — End: 2012-11-11
  Administered 2012-11-11: 100 mg via ORAL
  Filled 2012-11-11 (×2): qty 1

## 2012-11-11 NOTE — Progress Notes (Signed)
[facility] Progress Note   Barry Dean:[id number] DOB: [birth year] DOA: 11/09/2012 PCP: No primary provider on file.  Brief narrative: 79 y.o. year old male brought to the ED for nausea vomiting diarrhea x3 days. Patient is a resident of a nursing home.  Patient also reported generalized abdominal pain over the same time frame. Patient reported multiple sick contacts with similar symptoms. While history was limited, patient denied any fevers or chills. Patient had some mild shortness of breath. No wheezing. No trouble urinating. No bloody stools.   In the ER, patient was noted to have white blood cell count 13.2, bilateral pulmonary infiltrates on chest x-ray as well as colonic ileus versus infection on CT scan. Patient also noted to be Hemoccult positive. Initially hypoxic in mid 80s. This improved with supplemental O2. No prior hx/o obstructive lung disease.   Assessment/Plan:  HCAP vs/ aspiration PNA Likely aspiration (due to vomiting, not dysphagia)-suspect GERD/Hiatal Hernia playing a significant role here. Need head end of the bed up atleast 45 degrees Swallow eval pending for diet recs. Patient afebrile Blood cultures pending. Zosyn discontinued.  On vanc and levaquin.  Guaiac + stool - normocytic anemia baseline Hgb dating back to Nov'12 is ~11.3 Hgb has risen to 13.0.   No further stools. -?utility of further screening-work up at 89 years  mildly distended and gas filled colon on CT Mild abdominal pain  No further stools-diarrhea resolved-tolerated diet without any vomiting Decreased bowel sounds Potassium 3.9 Ambulate patient  + UA / UTI Culture shows 35000 colonies of multiple morphotypes.  Large hiatal hernia Noted on CT of abdom  Sz D/O No seizure activity. Continue Keppra and Vimpat  HTN BP stable on catapres and labetalol  Chronic hyponatremia Baseline Na appears to be ~129-131 as per [facility]'91 labs  Parkinson's Disease -stable  Afib Rate  controlled - not an appropriate candidate for anticoag due to possible GI bleed as well as high fall risk  Code Status: NO CODE BLUE Family Communication:  Disposition Plan: Return to assisted living in next 24 - 48 hours  Antibiotics: Zosyn 3/17 >> 3/17 Levaquin 3/17 >> Vanc 3/17 >>  DVT prophylaxis: SCDs  HPI/Subjective: Patient complains of hypogastric abdominal pain.  Otherwise no complaints. He reports no bowel movements  Objective: Blood pressure 138/68, pulse 60, temperature 99 F (37.2 C), temperature source Oral, resp. rate 20, height 6' (1.829 m), weight 102.7 kg (226 lb 6.6 oz), SpO2 93.00%.  Intake/Output Summary (Last 24 hours) at 11/11/12 1151 Last data filed at 11/11/12 1054  Gross per 24 hour  Intake 2171.67 ml  Output   3050 ml  Net -878.33 ml    Exam: F/U exam completed  Data Reviewed: Basic Metabolic Panel:  Recent Labs Lab 11/09/12 2133 11/10/12 0425 11/10/12 1047 11/11/12 0648  NA 126* 130* 128* 129*  K 4.1 3.8 4.1 3.9  CL 94* 99 97 95*  CO2 19 23 24 26   GLUCOSE 125* 91 85 82  BUN 21 16 13 9   CREATININE 1.09 0.95 0.85 0.82  CALCIUM 9.2 8.6 8.6 8.7   Liver Function Tests:  Recent Labs Lab 11/09/12 2133 11/10/12 0425  AST 27 22  ALT 21 18  ALKPHOS 78 70  BILITOT 0.5 0.6  PROT 7.4 6.7  ALBUMIN 3.8 3.5    Recent Labs Lab 11/09/12 2133  LIPASE 26   CBC:  Recent Labs Lab 11/09/12 2133 11/10/12 0425 11/11/12 0648  WBC 13.2* 8.4 6.7  NEUTROABS 10.2* 5.4  --  HGB 14.3 13.2 13.0  HCT 39.7 37.5* 36.4*  MCV 86.1 84.1 85.8  PLT 198 192 189   Cardiac Enzymes:  Recent Labs Lab 11/10/12 0640 11/10/12 1045 11/10/12 1726  TROPONINI <0.30 <0.30 <0.30   BNP (last 3 results)  Recent Labs  11/10/12 0640  PROBNP 113.2    Recent Results (from the past 240 hour(s))  URINE CULTURE     Status: None   Collection Time    11/10/12 12:06 AM      Result Value Range Status   Specimen Description URINE, RANDOM   Final    Special Requests CX ADDED AT 0037 ON [id number]   Final   Culture  Setup Time 11/10/2012 01:28   Final   Colony Count 35,000 COLONIES/ML   Final   Culture     Final   Value: Multiple bacterial morphotypes present, none predominant. Suggest appropriate recollection if clinically indicated.   Report Status 11/11/2012 FINAL   Final  CULTURE, BLOOD (ROUTINE X 2)     Status: None   Collection Time    11/10/12  4:20 AM      Result Value Range Status   Specimen Description BLOOD RIGHT HAND   Final   Special Requests BOTTLES DRAWN AEROBIC AND ANAEROBIC 10CC EA   Final   Culture  Setup Time 11/10/2012 08:56   Final   Culture     Final   Value:        BLOOD CULTURE RECEIVED NO GROWTH TO DATE CULTURE WILL BE HELD FOR 5 DAYS BEFORE ISSUING A FINAL NEGATIVE REPORT   Report Status PENDING   Incomplete  CULTURE, BLOOD (ROUTINE X 2)     Status: None   Collection Time    11/10/12  4:30 AM      Result Value Range Status   Specimen Description BLOOD LEFT ARM   Final   Special Requests BOTTLES DRAWN AEROBIC ONLY 10CC   Final   Culture  Setup Time 11/10/2012 08:56   Final   Culture     Final   Value:        BLOOD CULTURE RECEIVED NO GROWTH TO DATE CULTURE WILL BE HELD FOR 5 DAYS BEFORE ISSUING A FINAL NEGATIVE REPORT   Report Status PENDING   Incomplete  MRSA PCR SCREENING     Status: None   Collection Time    11/10/12  5:29 AM      Result Value Range Status   MRSA by PCR NEGATIVE  NEGATIVE Final   Comment:            The GeneXpert MRSA Assay (FDA     approved for NASAL specimens     only), is one component of a     comprehensive MRSA colonization     surveillance program. It is not     intended to diagnose MRSA     infection nor to guide or     monitor treatment for     MRSA infections.     Studies:  Recent x-ray studies have been reviewed in detail by the Attending Physician  Scheduled Meds:  Scheduled Meds: . amLODipine  10 mg Oral Daily  . cloNIDine  0.1 mg Oral BID  . docusate sodium   100 mg Oral BID  . ferrous sulfate  325 mg Oral Daily  . labetalol  100 mg Oral BID  . lacosamide  150 mg Oral QHS  . levETIRAcetam  750 mg Oral BID  . levofloxacin (LEVAQUIN) IV  750 mg Intravenous Q24H  . pantoprazole  40 mg Oral Q1200  . vancomycin  1,250 mg Intravenous Q12H   Continuous Infusions: . sodium chloride 100 mL/hr at 11/11/12 0981     Algis Downs, PA-C [facility] Pager: [phone]  11/11/2012, 11:51 AM   LOS: 2 days    Attending Patient seen and examined, agree with the assessment and plan as outlined above Await SLP eval, c.w current antibiotics-suspect ready for d/c in am  Windell Norfolk MD

## 2012-11-11 NOTE — Evaluation (Signed)
Physical Therapy Evaluation Patient Details Name: Barry Dean MRN: [id number] DOB: [birth year] Today's Date: 11/11/2012 Time: 4098-1191 PT Time Calculation (min): 17 min  PT Assessment / Plan / Recommendation Clinical Impression  Pt adm with PNA and N/V.  Pt lives at [facility].  Recommend pt return to ALF with HHPT.    PT Assessment  Patient needs continued PT services    Follow Up Recommendations  Home health PT (at ALF)    Does the patient have the potential to tolerate intense rehabilitation      Barriers to Discharge        Equipment Recommendations  None recommended by PT    Recommendations for Other Services     Frequency Min 3X/week    Precautions / Restrictions     Pertinent Vitals/Pain N/A      Mobility  Bed Mobility Bed Mobility: Supine to Sit;Sitting - Scoot to Edge of Bed Supine to Sit: 5: Supervision;HOB elevated;With rails Sitting - Scoot to Edge of Bed: 5: Supervision Details for Bed Mobility Assistance: incr time Transfers Transfers: Sit to Stand;Stand to Sit Sit to Stand: 4: Min guard;With upper extremity assist;From bed Stand to Sit: 4: Min guard;With upper extremity assist;With armrests;To chair/3-in-1 Ambulation/Gait Ambulation/Gait Assistance: 4: Min guard Ambulation Distance (Feet): 150 Feet Assistive device: Rolling walker Ambulation/Gait Assistance Details: Pt prefers using rollator but didn't have rollator available during eval. Gait Pattern: Step-through pattern;Decreased step length - right;Decreased step length - left;Trunk flexed Gait velocity: decr    Exercises     PT Diagnosis: Difficulty walking;Generalized weakness  PT Problem List: Decreased strength;Decreased balance;Decreased mobility PT Treatment Interventions: DME instruction;Gait training;Patient/family education;Functional mobility training;Therapeutic activities;Therapeutic exercise;Balance training   PT Goals Acute Rehab PT Goals PT Goal Formulation:  With patient Time For Goal Achievement: 11/18/12 Potential to Achieve Goals: Good Pt will go Sit to Stand: with modified independence PT Goal: Sit to Stand - Progress: Goal set today Pt will go Stand to Sit: with modified independence PT Goal: Stand to Sit - Progress: Goal set today Pt will Ambulate: 51 - 150 feet;with supervision;with least restrictive assistive device PT Goal: Ambulate - Progress: Goal set today  Visit Information  Last PT Received On: 11/11/12 Assistance Needed: +1    Subjective Data  Subjective: Pt states he likes [facility] and wants to go back there. Patient Stated Goal: Return to [facility].   Prior Functioning  Home Living Lives With: Other (Comment) (ALF) Available Help at Discharge: Available 24 hours/day;Other (Comment) (staff at ALF) Type of Home: Assisted living Home Access: Level entry Home Layout: One level Home Adaptive Equipment: Walker - four wheeled Prior Function Comments: Difficult to assess due to [name] Communication Communication: HOH    Cognition  Cognition Overall Cognitive Status: Difficult to assess Difficult to assess due to: Hard of hearing/deaf Arousal/Alertness: Awake/alert Behavior During Session: [name] for tasks performed    Extremity/Trunk Assessment Right Lower Extremity Assessment RLE ROM/Strength/Tone: Deficits RLE ROM/Strength/Tone Deficits: grossly 4/5 Left Lower Extremity Assessment LLE ROM/Strength/Tone: Deficits LLE ROM/Strength/Tone Deficits: grossly 4/5   Balance Balance Balance Assessed: Yes Static Standing Balance Static Standing - Balance Support: Bilateral upper extremity supported (on walker) Static Standing - Level of Assistance: 5: Stand by assistance  End of Session PT - End of Session Equipment Utilized During Treatment: Gait belt Activity Tolerance: Patient tolerated treatment well Patient left: in chair;with call bell/phone within reach Nurse Communication: Mobility status  GP      [facility] 11/11/2012, 11:58 AM  [employer]  PT [phone]

## 2012-11-11 NOTE — Evaluation (Signed)
Clinical/Bedside Swallow Evaluation Patient Details  Name: Barry Dean MRN: [id number] Date of Birth: Dec 26, 1923  Today's Date: 11/11/2012 Time: 4098-1191 SLP Time Calculation (min): 14 min  Past Medical History:  Past Medical History  Diagnosis Date  . Hypertension   . Seizures   . A-fib   . Dehydration   . Deafness   . Parkinson's disease   . Anxiety   . Reflux   . Arthritis   . Depression   . Atrial fibrillation   . Parkinson disease   . Tremor, essential   . Seizure   . BPH (benign prostatic hyperplasia)   . Syncope   . Deaf   . Melanoma   . GERD (gastroesophageal reflux disease)    Past Surgical History:  Past Surgical History  Procedure Laterality Date  . Unknown    . Appendectomy    . Cataract extraction w/ intraocular lens  implant, bilateral     HPI:  79 y.o. year old male brought to the ED for nausea vomiting diarrhea x3 days. Patient is a resident of a nursing home. Patient also reported generalized abdominal pain over the same time frame. Patient reported multiple sick contacts with similar symptoms. While history was limited, patient denied any fevers or chills. Patient had some mild shortness of breath. Diagnosed with HCAP vs aspiration PNA due to vomiting.    Assessment / Plan / Recommendation Clinical Impression  Patient presents with a functional appearing oropharyngeal swallow consistent with most recent MBS results from approximately 2 years prior. Daughter present in room and does not report any overt s/s of aspiration at baseline and no recent episodes of PNA in the past 2 years. Although aspiration due to vomitting likely the cause of acute PNA episode, h/o dysphagia, advanced age, and decreased functional reserve due to acute illness do increase aspiration risk. Education complete with patient and daughter regarding general safe swallowing precautions including small bite/sips, slow rate of intake, and full clearance of solids from oral cavity  prior to liquid intake as patient moderately impulsive with intake (baseline per daugther). No f/u SLP needs indicated at this time. If PNA becomes recurrent without other cause, may wish to consider repeat objective evaluation.     Aspiration Risk  Mild    Diet Recommendation Regular;Thin liquid   Liquid Administration via: Cup;Straw Medication Administration: Whole meds with liquid Supervision: Patient able to self feed;Intermittent supervision to cue for compensatory strategies Compensations: Slow rate;Small sips/bites (clear oral cavity prior to liquid consumption) Postural Changes and/or Swallow Maneuvers: Seated upright 90 degrees;Upright 30-60 min after meal    Other  Recommendations Oral Care Recommendations: Oral care BID   Follow Up Recommendations  None    Frequency and Duration        Pertinent Vitals/Pain None reported     Swallow Study Prior Functional Status  Type of Home: Assisted living Lives With: Other (Comment) (ALF) Available Help at Discharge: Available 24 hours/day;Other (Comment) (staff at ALF)    General HPI: 79 y.o. year old male brought to the ED for nausea vomiting diarrhea x3 days. Patient is a resident of a nursing home. Patient also reported generalized abdominal pain over the same time frame. Patient reported multiple sick contacts with similar symptoms. While history was limited, patient denied any fevers or chills. Patient had some mild shortness of breath. Diagnosed with HCAP vs aspiration PNA due to vomiting.  Type of Study: Bedside swallow evaluation Previous Swallow Assessment: MBS 07/20/11 noted flash penetration of thin liquids Diet  Prior to this Study: Regular;Thin liquids Temperature Spikes Noted: No Respiratory Status: Supplemental O2 delivered via (comment) (3 L nasal cannula) History of Recent Intubation: No Behavior/Cognition: Alert;Cooperative;Pleasant mood Oral Cavity - Dentition: Dentures, top;Dentures, bottom Self-Feeding  Abilities: Able to feed self Patient Positioning: Upright in chair Baseline Vocal Quality: Clear Volitional Cough: Strong Volitional Swallow: Able to elicit    Oral/Motor/Sensory Function Overall Oral Motor/Sensory Function: Appears within functional limits for tasks assessed   Ice Chips Ice chips: Not tested   Thin Liquid Thin Liquid: Within functional limits Presentation: Cup;Self Fed;Straw    Nectar Thick Nectar Thick Liquid: Not tested   Honey Thick Honey Thick Liquid: Not tested   Puree Puree: Within functional limits Presentation: Self Fed;Spoon   Solid   GO   Barry Richardson MA, CCC-SLP [phone]  Solid: Within functional limits Presentation: Self Fed       Barry Dean Meryl 11/11/2012,2:58 PM

## 2012-11-12 DIAGNOSIS — K529 Noninfective gastroenteritis and colitis, unspecified: Secondary | ICD-10-CM | POA: Diagnosis present

## 2012-11-12 DIAGNOSIS — I10 Essential (primary) hypertension: Secondary | ICD-10-CM | POA: Diagnosis not present

## 2012-11-12 DIAGNOSIS — J189 Pneumonia, unspecified organism: Secondary | ICD-10-CM | POA: Diagnosis present

## 2012-11-12 DIAGNOSIS — J9620 Acute and chronic respiratory failure, unspecified whether with hypoxia or hypercapnia: Secondary | ICD-10-CM | POA: Diagnosis present

## 2012-11-12 DIAGNOSIS — E871 Hypo-osmolality and hyponatremia: Secondary | ICD-10-CM | POA: Diagnosis not present

## 2012-11-12 MED ORDER — FAMOTIDINE 20 MG PO TABS: 20.0000 mg | ORAL_TABLET | Freq: Every day | ORAL | Status: DC

## 2012-11-12 MED ORDER — METHOCARBAMOL 500 MG PO TABS
ORAL_TABLET | ORAL | Status: AC
  Filled 2012-11-12: qty 1

## 2012-11-12 MED ORDER — POLYSACCHARIDE IRON COMPLEX 150 MG PO CAPS: 150.0000 mg | ORAL_CAPSULE | Freq: Two times a day (BID) | ORAL | Status: DC

## 2012-11-12 MED ORDER — FAMOTIDINE 20 MG PO TABS
20.0000 mg | ORAL_TABLET | Freq: Every day | ORAL | Status: DC
  Administered 2012-11-12: 20 mg via ORAL
  Filled 2012-11-12 (×2): qty 1

## 2012-11-12 MED ORDER — LEVOFLOXACIN 500 MG PO TABS: 500.0000 mg | ORAL_TABLET | Freq: Every day | ORAL | Status: DC

## 2012-11-12 MED ORDER — IPRATROPIUM-ALBUTEROL 0.5-2.5 (3) MG/3ML IN SOLN: 3.0000 mL | Freq: Two times a day (BID) | RESPIRATORY_TRACT | Status: DC

## 2012-11-12 NOTE — Discharge Summary (Signed)
Physician Discharge Summary  Barry Dean ZOX:[id number] DOB: 19-Oct-1923 DOA: 11/09/2012  PCP: No primary provider on file.  Admit date: 11/09/2012 Discharge date: 11/12/2012  Time spent: 45 minutes  Recommendations for Outpatient Follow-up:   Monitor patient's bowel movements to ensure they restart  ContinuedPhysical Therapy  Monitor Sodium levels. Patient with chronic hyponatremia  Check hemoglobin in 2 weeks. Patient has had heme-positive stool  Respiratory therapy. Patient on oxygen via nasal cannula, and scheduled DuoNeb's. Please reassess need for oxygen in the next 3-4 weeks.  Utilize aspiration precautions, keep head of bed elevated to at least 30 degrees  DNR  Discharge Diagnoses: And and Active Problems:   Noninfectious gastroenteritis and colitis   Acute and chronic respiratory failure   Community acquired pneumonia   Discharge Condition: Stable, no longer with diarrhea.  Needs continuous oxygen n/c at 3 liters to maintain oxygen saturations above 90%  Diet recommendation: Heart Healthy  Filed Weights   11/10/12 0500 11/10/12 0515  Weight: 102 kg (224 lb 13.9 oz) 102.7 kg (226 lb 6.6 oz)    History of present illness:  79 y.o. year old male brought to the ED for nausea vomiting diarrhea x3 days. Patient is a resident of [name]. Patient also reported generalized abdominal pain over the same time frame. Patient reported multiple sick contacts with similar symptoms. While history was limited, patient denied any fevers or chills. Patient had some mild shortness of breath. No wheezing. No trouble urinating. No bloody stools.  In the ER, patient was noted to have white blood cell count 13.2, bilateral pulmonary infiltrates on chest x-ray as well as colonic ileus versus infection on CT scan. Patient also noted to be Hemoccult positive. Initially hypoxic in mid 80s. This improved with supplemental O2. No prior hx/o obstructive lung disease.  Hospital  Course:  CAP vs/ aspiration PNA in the setting of chronic lung disease Likely aspiration (due to vomiting, not dysphagia)-suspect GERD/Hiatal Hernia playing a significant role here. Need head end of the bed up at least 45 degrees.  Swallow evaluation completed.  Recommended diet was regular with thin liquids.  There were no signs of aspiration. Patient afebrile.   Blood cultures pending, but show no growth to date. Initially treated with Vanc/Zosyn/Levaquin.  Will finish his course of therapy on oral levaquin as an outpatient. We have ordered respiratory therapy and BID nebs as an outpatient.  Acute respiratory failure in the setting of Chronic Lung Disease Patient's oxygen saturations drop to 80% on room air. He quickly re-bounds to 90% when placed on 3 liters. He will be discharged with continuous oxygen therapy.   Review of CXR shows extensive chronic changes in B/L lower lobes-suspect chronic changes-hypoxia likely worsened by perhaps aspiration. Reviewed CXR with Susan-daughter at bedside-showed her the images as well. Given advanced age, poor overall fragile state with Parkinsons-I do not feel that further work is indicated as it will not change the management. She understands and agrees with this plan-will just discharge on Oxygen-Attending MD at the ALF can repeat a CXR in 6-8 weeks depending on his discretion  Gastroenteritis Mildly distended and gas filled colon on CT  Recent history of nausea, vomiting and diarrhea Symptoms resolved with supportive care Patient restarted on stool softeners.  Please monitor to ensure stools restart.  Last stool was 3/16.  Guaiac + stool - normocytic anemia  Baseline Hgb dating back to Nov'12 is ~11.3.  Patient found to have guiac+ stool and low iron.   Hgb did  not drop but rather rose to 13.0. The patient has had no further stools. Patient has been started on Nu-iron (rather than ferrous sulfate to decrease constipation).  If he drops his hgb or  continues to have g+ stools he will need a GI consultation, but we question the utility of further screening-work up at 79 years old.  + UA / UTI  Culture shows 35000 colonies of multiple morphotypes.   Large hiatal hernia  Noted on CT of abdom.  May contribute to symptoms of dyspepsia.  Patient started on Pepcid.  Sz D/O  No seizure activity.  Continue Keppra and Vimpat   HTN  BP stable on catapres and labetalol   Chronic hyponatremia  Baseline Na appears to be ~129-131 as per RUE'45 labs   Parkinson's Disease  -stable   Afib  Rate controlled - not an appropriate candidate for anticoag due to possible GI bleed as well as high fall risk.  Ethics/Palliative Care -Establised DNR -slow but persistant decline in functional status -long d/w daughter Megan Mans is aware of above-and does not want heroic/aggressive care-at some point may need Goals of Care meeting with Palliative services while at ALF.  Code Status: NO CODE BLUE   Discharge Exam: Filed Vitals:   11/11/12 2157 11/12/12 0628 11/12/12 0825 11/12/12 0828  BP: 154/69 142/84    Pulse: 61 58    Temp:  98.4 F (36.9 C)    TempSrc:  Oral    Resp:  32    Height:      Weight:      SpO2:  96% 80% 90%    General: Well-developed, elderly male, lying in bed, very hard of hearing, wearing oxygen via nasal cannula Cardiovascular: Regular rate and rhythm, no obvious murmurs rubs or gallops, no lower extremity edema Respiratory: Clear consultation, no accessory muscle movement Abdomen:  Soft, Decreased bowel sounds, non-tender today, no masses. Extremities:  Able to move all 4, 5/5 strength.  Discharge Instructions      Discharge Orders   Future Orders Complete By Expires     DME Nebulizer machine  As directed     Diet - low sodium heart healthy  As directed     Face-to-face encounter (required for Medicare/Medicaid patients)  As directed     Comments:      I Stephani Police certify that this patient is under my  care and that I, or a nurse practitioner or physician's assistant working with me, had a face-to-face encounter that meets the physician face-to-face encounter requirements with this patient on 11/12/2012. The encounter with the patient was in whole, or in part for the following medical condition(s) which is the primary reason for home health care (List medical condition): gastroenteritis, debilitation, acute respiratory failure on chronic lung disease    Questions:      The encounter with the patient was in whole, or in part, for the following medical condition, which is the primary reason for home health care:  gastroenteritis, debilitation, acute respiratory failure on chronic lung disease    I certify that, based on my findings, the following services are medically necessary home health services:  Physical therapy    My clinical findings support the need for the above services:  Leaving home exacerbates symptoms (pain, dyspnea, anxiety etc.)    Further, I certify that my clinical findings support that this patient is homebound due to:  Immunocompromised    Reason for Medically Necessary Home Health Services:  Other See Comments  Home Health  As directed     Questions:      To provide the following care/treatments:  Respiratory Care    Increase activity slowly  As directed         Medication List    STOP taking these medications       ferrous sulfate 325 (65 FE) MG tablet      TAKE these medications       acetaminophen 500 MG tablet  Commonly known as:  TYLENOL  Take 500 mg by mouth every 6 (six) hours as needed for pain. Not to exceed 3 gms of acetaminophen in 24 hours     amLODipine-benazepril 10-40 MG per capsule  Commonly known as:  LOTREL  Take 1 capsule by mouth daily.     aspirin 81 MG chewable tablet  Chew 81 mg by mouth every evening.     cloNIDine 0.1 MG tablet  Commonly known as:  CATAPRES  Take 0.1 mg by mouth 2 (two) times daily.     famotidine 20 MG tablet   Commonly known as:  PEPCID  Take 1 tablet (20 mg total) by mouth daily.     ipratropium-albuterol 0.5-2.5 (3) MG/3ML Soln  Commonly known as:  DUONEB  Take 3 mLs by nebulization 2 (two) times daily.     iron polysaccharides 150 MG capsule  Commonly known as:  NIFEREX  Take 1 capsule (150 mg total) by mouth 2 (two) times daily.     labetalol 100 MG tablet  Commonly known as:  NORMODYNE  Take 100 mg by mouth 2 (two) times daily.     levETIRAcetam 750 MG tablet  Commonly known as:  KEPPRA  Take 750 mg by mouth 2 (two) times daily.     levofloxacin 500 MG tablet  Commonly known as:  LEVAQUIN  Take 1 tablet (500 mg total) by mouth daily.     Lubriderm Advanced Therapy Lotn  Apply 1 application topically daily. Apply to feet after bathing every day     Melatonin 3 MG Caps  Take 6 mg by mouth at bedtime as needed (for sleep).     omeprazole 20 MG capsule  Commonly known as:  PRILOSEC  Take 20 mg by mouth daily.     polyethylene glycol packet  Commonly known as:  MIRALAX / GLYCOLAX  Take 17 g by mouth every other day.     VIMPAT 150 MG Tabs  Generic drug:  Lacosamide  Take 150 mg by mouth at bedtime.          The results of significant diagnostics from this hospitalization (including imaging, microbiology, ancillary and laboratory) are listed below for reference.    Significant Diagnostic Studies: Dg Chest 2 View  11/09/2012  *RADIOLOGY REPORT*  Clinical Data: Shortness of breath.  CHEST - 2 VIEW  Comparison: CT chest 07/19/2011.  Chest 07/17/2011.  Findings: Shallow inspiration.  There is interval development of parenchymal airspace disease in both mid and lower lungs, greater on the left.  Changes suggest edema versus pneumonia.  The heart size is prominent.  Pulmonary vascularity is obscured by the parenchymal process.  No blunting of costophrenic angles.  No pneumothorax.  Calcification of the aorta.  Esophageal hiatal hernia behind the heart.  Degenerative changes in  the thoracic spine with mild thoracic kyphosis.  IMPRESSION: Bilateral airspace disease, greater on the left suggesting edema or pneumonia.  Mild cardiac enlargement.  Esophageal hiatal hernia behind the heart.   Original Report Authenticated By: Chrissie Noa  Andria Meuse, M.D.    Ct Abdomen Pelvis W Contrast  11/10/2012  *RADIOLOGY REPORT*  Clinical Data: Nausea, vomiting, abdominal pain, and distension.  CT ABDOMEN AND PELVIS WITH CONTRAST  Technique:  Multidetector CT imaging of the abdomen and pelvis was performed following the standard protocol during bolus administration of intravenous contrast.  Contrast: OMNIPAQUE IOHEXOL 300 MG/ML  SOLN  Comparison: None.  Findings: There is patchy infiltration in both lung bases suggesting possible pneumonia.  Large esophageal hiatal hernia containing most of the stomach.  The liver, spleen, gallbladder, adrenal glands, and retroperitoneal lymph nodes are unremarkable.  The pancreas is atrophic. Calcification of the abdominal aorta without aneurysm.  There is a focal perfusion defect in the lower pole of the left kidney which may represent a small cyst or localized infarct.  No solid mass or hydronephrosis in either kidney.  The small bowel are decompressed. The colon is mildly distended and gas filled diffusely with liquid stool present.  This may represent ileus or infection.  No significant colonic wall thickening.  No free air or free fluid in the abdomen.  Surgical clips in the anterior abdominal wall.  Pelvis:  Prostate gland is prominent, measuring 5.1 x 3.8 cm. Bladder wall is not thickened.  No free or loculated pelvic fluid collections.  No significant pelvic lymphadenopathy.  The appendix is surgically absent by history.  Degenerative changes in the lumbar spine.  Schmorl's nodes are present.  Degenerative versus postoperative changes in the facet joints.  IMPRESSION: Large esophageal hiatal hernia containing most of the stomach. Mild prominence of gas and fluid  filled colon suggesting ileus versus infection.  Infiltration in both lung bases. Lesion in the lower pole of the left kidney probably representing a cyst or possibly perfusion defect.   Original Report Authenticated By: Burman Nieves, M.D.    Dg Chest Port 1 View  11/11/2012  *RADIOLOGY REPORT*  Clinical Data: Follow-up evaluation of pulmonary infiltrates.  PORTABLE CHEST - 1 VIEW  Comparison: Chest x-ray 11/09/2012.  Findings: Lung volumes remain low.  There continue to be extensive patchy bilateral mid and lower lung opacities which are predominantly reticular in appearance.  Upper lungs are relatively clear.  There appears to be some mild pulmonary venous congestion, without frank pulmonary edema.  Heart size is mildly enlarged. Mediastinal contours are distorted by patient positioning.  Small bilateral pleural effusions are unchanged.  Atherosclerosis in the thoracic aorta.  IMPRESSION: 1.  Extensive mid and lower lung reticular predominant opacities may in part reflect areas of chronic scarring or an underlying interstitial lung disease as evidenced on prior CT examination, however, superimposed acute infection or sequelae of aspiration is also possible.  Clinical correlation is recommended. 2.  Small bilateral pleural effusions. 3.  Mild cardiomegaly. 4.  Atherosclerosis.   Original Report Authenticated By: Trudie Reed, M.D.     Microbiology: Recent Results (from the past 240 hour(s))  URINE CULTURE     Status: None   Collection Time    11/10/12 12:06 AM      Result Value Range Status   Specimen Description URINE, RANDOM   Final   Special Requests CX ADDED AT 0037 ON [id number]   Final   Culture  Setup Time 11/10/2012 01:28   Final   Colony Count 35,000 COLONIES/ML   Final   Culture     Final   Value: Multiple bacterial morphotypes present, none predominant. Suggest appropriate recollection if clinically indicated.   Report Status 11/11/2012 FINAL   Final  CULTURE, BLOOD (ROUTINE X 2)      Status: None   Collection Time    11/10/12  4:20 AM      Result Value Range Status   Specimen Description BLOOD RIGHT HAND   Final   Special Requests BOTTLES DRAWN AEROBIC AND ANAEROBIC 10CC EA   Final   Culture  Setup Time 11/10/2012 08:56   Final   Culture     Final   Value:        BLOOD CULTURE RECEIVED NO GROWTH TO DATE CULTURE WILL BE HELD FOR 5 DAYS BEFORE ISSUING A FINAL NEGATIVE REPORT   Report Status PENDING   Incomplete  CULTURE, BLOOD (ROUTINE X 2)     Status: None   Collection Time    11/10/12  4:30 AM      Result Value Range Status   Specimen Description BLOOD LEFT ARM   Final   Special Requests BOTTLES DRAWN AEROBIC ONLY 10CC   Final   Culture  Setup Time 11/10/2012 08:56   Final   Culture     Final   Value:        BLOOD CULTURE RECEIVED NO GROWTH TO DATE CULTURE WILL BE HELD FOR 5 DAYS BEFORE ISSUING A FINAL NEGATIVE REPORT   Report Status PENDING   Incomplete  MRSA PCR SCREENING     Status: None   Collection Time    11/10/12  5:29 AM      Result Value Range Status   MRSA by PCR NEGATIVE  NEGATIVE Final   Comment:            The GeneXpert MRSA Assay (FDA     approved for NASAL specimens     only), is one component of a     comprehensive MRSA colonization     surveillance program. It is not     intended to diagnose MRSA     infection nor to guide or     monitor treatment for     MRSA infections.     Labs: Basic Metabolic Panel:  Recent Labs Lab 11/09/12 2133 11/10/12 0425 11/10/12 1047 11/11/12 0648  NA 126* 130* 128* 129*  K 4.1 3.8 4.1 3.9  CL 94* 99 97 95*  CO2 19 23 24 26   GLUCOSE 125* 91 85 82  BUN 21 16 13 9   CREATININE 1.09 0.95 0.85 0.82  CALCIUM 9.2 8.6 8.6 8.7   Liver Function Tests:  Recent Labs Lab 11/09/12 2133 11/10/12 0425  AST 27 22  ALT 21 18  ALKPHOS 78 70  BILITOT 0.5 0.6  PROT 7.4 6.7  ALBUMIN 3.8 3.5    Recent Labs Lab 11/09/12 2133  LIPASE 26    Recent Labs Lab 11/09/12 2133 11/10/12 0425 11/11/12 0648   WBC 13.2* 8.4 6.7  NEUTROABS 10.2* 5.4  --   HGB 14.3 13.2 13.0  HCT 39.7 37.5* 36.4*  MCV 86.1 84.1 85.8  PLT 198 192 189   Cardiac Enzymes:  Recent Labs Lab 11/10/12 0640 11/10/12 1045 11/10/12 1726  TROPONINI <0.30 <0.30 <0.30   BNP: BNP (last 3 results)  Recent Labs  11/10/12 0640  PROBNP 113.2     Signed:  Conley Canal [phone]  [facility] 11/12/2012, 10:12 AM  Attending  Patient seen and examined, agree with above assessment and plan. Necessary edits have been performed. Patient is doing well, no signs of dysphagia on SLP eval. Suspect acute on chronic resp failure-acute episode from Presumed Aspiration PNA. Will  discharge on Oxygen. Long d/w Daughter-Susan at bedside. Please see above documentation  S Jennilyn Esteve

## 2012-11-12 NOTE — Care Management Note (Signed)
    Page 1 of 2   11/12/2012     11:53:48 AM   CARE MANAGEMENT NOTE 11/12/2012  Patient:  Barry Dean, Barry Dean   Account Number:  [id number]  Date Initiated:  11/12/2012  Documentation initiated by:  Letha Cape  Subjective/Objective Assessment:   dx hyponatremia, uti, pna, chronic  admit - from [facility].     Action/Plan:   pt eval- rec hhpt   Anticipated DC Date:  11/12/2012   Anticipated DC Plan:  ASSISTED LIVING / REST HOME  In-house referral  Clinical Social Worker      DC Associate Professor  CM consult      PAC Choice  DURABLE MEDICAL EQUIPMENT  HOME HEALTH   Choice offered to / List presented to:     DME arranged  OXYGEN  NEBULIZER MACHINE      DME agency  [facility]     HH arranged  HH-2 PT      HH agency  OTHER - SEE NOTE   Status of service:  Completed, signed off Medicare Important Message given?   (If response is "NO", the following Medicare IM given date fields will be blank) Date Medicare IM given:   Date Additional Medicare IM given:    Discharge Disposition:  ASSISTED LIVING  Per UR Regulation:  Reviewed for med. necessity/level of care/duration of stay  If discussed at Long Length of Stay Meetings, dates discussed:    Comments:  11/12/12 11:51 Letha Cape RN, BSN [phone] patient is from [facility], they will do the physical therapy there through [facility].  Patient's daughter is here and will transport him back to ALF.  Nebulizer machine and oxygen ordered through [facility] [place] notified, will bring to patient's room.  Patient is for dc today.

## 2012-11-12 NOTE — Progress Notes (Signed)
Pt was wheeled down to car with oxygen.  Reviewed discharge instructions.  Denied any questions or complaints.

## 2012-11-12 NOTE — Progress Notes (Signed)
Physical Therapy Treatment Patient Details Name: Barry Dean MRN: [id number] DOB: 1923/10/02 Today's Date: 11/12/2012 Time: 7829-5621 PT Time Calculation (min): 27 min  PT Assessment / Plan / Recommendation Comments on Treatment Session  Pt adm with PNA.  Pt for return to [facility]F.  Currently pt requiring oxygen to maintain SaO2.    Follow Up Recommendations  Home health PT (at ALF)     Does the patient have the potential to tolerate intense rehabilitation     Barriers to Discharge        Equipment Recommendations  None recommended by PT    Recommendations for Other Services    Frequency Min 3X/week   Plan Discharge plan remains appropriate;Frequency remains appropriate    Precautions / Restrictions Precautions Precautions: Fall   Pertinent Vitals/Pain SATURATION QUALIFICATIONS: (This note is used to comply with regulatory documentation for home oxygen)  Patient Saturations on Room Air at Rest = 80%  Patient Saturations on Room Air while Ambulating = N/T  Patient Saturations on 3 Liters of oxygen at Rest = 90%  Please briefly explain why patient needs home oxygen: Pt with decr SaO2 even at rest on RA.    Mobility  Bed Mobility Bed Mobility: Supine to Sit;Sitting - Scoot to Edge of Bed Supine to Sit: 5: Supervision;HOB elevated;With rails Sitting - Scoot to Edge of Bed: 5: Supervision Details for Bed Mobility Assistance: incr time Transfers Sit to Stand: 4: Min guard;With upper extremity assist;From bed Stand to Sit: With armrests;To chair/3-in-1;5: Supervision;With upper extremity assist Ambulation/Gait Ambulation/Gait Assistance: 4: Min guard Ambulation Distance (Feet): 150 Feet Assistive device: Rollator Ambulation/Gait Assistance Details: verbal cues to stand more erect. Gait Pattern: Step-through pattern;Decreased step length - right;Decreased step length - left;Trunk flexed Gait velocity: decr    Exercises     PT Diagnosis:    PT Problem  List:   PT Treatment Interventions:     PT Goals Acute Rehab PT Goals PT Goal: Sit to Stand - Progress: Progressing toward goal PT Goal: Stand to Sit - Progress: Progressing toward goal PT Goal: Ambulate - Progress: Progressing toward goal  Visit Information  Last PT Received On: 11/12/12 Assistance Needed: +1    Subjective Data  Subjective: Pt states he is okay.   Cognition  Cognition Overall Cognitive Status: Difficult to assess Difficult to assess due to: Hard of hearing/deaf Arousal/Alertness: Awake/alert Behavior During Session: Flat affect    Balance  Static Standing Balance Static Standing - Balance Support: Bilateral upper extremity supported Static Standing - Level of Assistance: 5: Stand by assistance  End of Session PT - End of Session Equipment Utilized During Treatment: Gait belt Activity Tolerance: Patient tolerated treatment well Patient left: in chair;with call bell/phone within reach;with nursing in room Nurse Communication: Other (comment) (decr SaO2 on RA)   GP     Barry Dean 11/12/2012, 8:48 AM  [facility] PT [phone]

## 2012-11-16 LAB — CULTURE, BLOOD (ROUTINE X 2)
Culture: NO GROWTH
Culture: NO GROWTH

## 2012-11-18 DIAGNOSIS — K5900 Constipation, unspecified: Secondary | ICD-10-CM | POA: Diagnosis not present

## 2012-11-25 DIAGNOSIS — M6281 Muscle weakness (generalized): Secondary | ICD-10-CM | POA: Diagnosis not present

## 2012-11-25 DIAGNOSIS — R262 Difficulty in walking, not elsewhere classified: Secondary | ICD-10-CM | POA: Diagnosis not present

## 2012-11-25 DIAGNOSIS — R269 Unspecified abnormalities of gait and mobility: Secondary | ICD-10-CM | POA: Diagnosis not present

## 2012-11-25 DIAGNOSIS — R29818 Other symptoms and signs involving the nervous system: Secondary | ICD-10-CM | POA: Diagnosis not present

## 2012-11-26 DIAGNOSIS — M6281 Muscle weakness (generalized): Secondary | ICD-10-CM | POA: Diagnosis not present

## 2012-11-26 DIAGNOSIS — R269 Unspecified abnormalities of gait and mobility: Secondary | ICD-10-CM | POA: Diagnosis not present

## 2012-11-26 DIAGNOSIS — R262 Difficulty in walking, not elsewhere classified: Secondary | ICD-10-CM | POA: Diagnosis not present

## 2012-11-26 DIAGNOSIS — R29818 Other symptoms and signs involving the nervous system: Secondary | ICD-10-CM | POA: Diagnosis not present

## 2012-11-27 DIAGNOSIS — R29818 Other symptoms and signs involving the nervous system: Secondary | ICD-10-CM | POA: Diagnosis not present

## 2012-11-27 DIAGNOSIS — M6281 Muscle weakness (generalized): Secondary | ICD-10-CM | POA: Diagnosis not present

## 2012-11-27 DIAGNOSIS — R269 Unspecified abnormalities of gait and mobility: Secondary | ICD-10-CM | POA: Diagnosis not present

## 2012-11-27 DIAGNOSIS — R262 Difficulty in walking, not elsewhere classified: Secondary | ICD-10-CM | POA: Diagnosis not present

## 2012-11-28 DIAGNOSIS — R269 Unspecified abnormalities of gait and mobility: Secondary | ICD-10-CM | POA: Diagnosis not present

## 2012-11-28 DIAGNOSIS — R29818 Other symptoms and signs involving the nervous system: Secondary | ICD-10-CM | POA: Diagnosis not present

## 2012-11-28 DIAGNOSIS — M6281 Muscle weakness (generalized): Secondary | ICD-10-CM | POA: Diagnosis not present

## 2012-11-28 DIAGNOSIS — R262 Difficulty in walking, not elsewhere classified: Secondary | ICD-10-CM | POA: Diagnosis not present

## 2012-12-01 DIAGNOSIS — R262 Difficulty in walking, not elsewhere classified: Secondary | ICD-10-CM | POA: Diagnosis not present

## 2012-12-01 DIAGNOSIS — R29818 Other symptoms and signs involving the nervous system: Secondary | ICD-10-CM | POA: Diagnosis not present

## 2012-12-01 DIAGNOSIS — R269 Unspecified abnormalities of gait and mobility: Secondary | ICD-10-CM | POA: Diagnosis not present

## 2012-12-01 DIAGNOSIS — M6281 Muscle weakness (generalized): Secondary | ICD-10-CM | POA: Diagnosis not present

## 2012-12-02 DIAGNOSIS — M6281 Muscle weakness (generalized): Secondary | ICD-10-CM | POA: Diagnosis not present

## 2012-12-02 DIAGNOSIS — R262 Difficulty in walking, not elsewhere classified: Secondary | ICD-10-CM | POA: Diagnosis not present

## 2012-12-02 DIAGNOSIS — R269 Unspecified abnormalities of gait and mobility: Secondary | ICD-10-CM | POA: Diagnosis not present

## 2012-12-02 DIAGNOSIS — R29818 Other symptoms and signs involving the nervous system: Secondary | ICD-10-CM | POA: Diagnosis not present

## 2012-12-03 DIAGNOSIS — R29818 Other symptoms and signs involving the nervous system: Secondary | ICD-10-CM | POA: Diagnosis not present

## 2012-12-03 DIAGNOSIS — R262 Difficulty in walking, not elsewhere classified: Secondary | ICD-10-CM | POA: Diagnosis not present

## 2012-12-03 DIAGNOSIS — M6281 Muscle weakness (generalized): Secondary | ICD-10-CM | POA: Diagnosis not present

## 2012-12-03 DIAGNOSIS — R269 Unspecified abnormalities of gait and mobility: Secondary | ICD-10-CM | POA: Diagnosis not present

## 2012-12-04 DIAGNOSIS — M6281 Muscle weakness (generalized): Secondary | ICD-10-CM | POA: Diagnosis not present

## 2012-12-04 DIAGNOSIS — R269 Unspecified abnormalities of gait and mobility: Secondary | ICD-10-CM | POA: Diagnosis not present

## 2012-12-04 DIAGNOSIS — R29818 Other symptoms and signs involving the nervous system: Secondary | ICD-10-CM | POA: Diagnosis not present

## 2012-12-04 DIAGNOSIS — R262 Difficulty in walking, not elsewhere classified: Secondary | ICD-10-CM | POA: Diagnosis not present

## 2012-12-05 DIAGNOSIS — M6281 Muscle weakness (generalized): Secondary | ICD-10-CM | POA: Diagnosis not present

## 2012-12-05 DIAGNOSIS — R269 Unspecified abnormalities of gait and mobility: Secondary | ICD-10-CM | POA: Diagnosis not present

## 2012-12-05 DIAGNOSIS — R29818 Other symptoms and signs involving the nervous system: Secondary | ICD-10-CM | POA: Diagnosis not present

## 2012-12-05 DIAGNOSIS — R262 Difficulty in walking, not elsewhere classified: Secondary | ICD-10-CM | POA: Diagnosis not present

## 2012-12-08 DIAGNOSIS — M6281 Muscle weakness (generalized): Secondary | ICD-10-CM | POA: Diagnosis not present

## 2012-12-08 DIAGNOSIS — R269 Unspecified abnormalities of gait and mobility: Secondary | ICD-10-CM | POA: Diagnosis not present

## 2012-12-08 DIAGNOSIS — R262 Difficulty in walking, not elsewhere classified: Secondary | ICD-10-CM | POA: Diagnosis not present

## 2012-12-08 DIAGNOSIS — R29818 Other symptoms and signs involving the nervous system: Secondary | ICD-10-CM | POA: Diagnosis not present

## 2012-12-09 DIAGNOSIS — R29818 Other symptoms and signs involving the nervous system: Secondary | ICD-10-CM | POA: Diagnosis not present

## 2012-12-09 DIAGNOSIS — R269 Unspecified abnormalities of gait and mobility: Secondary | ICD-10-CM | POA: Diagnosis not present

## 2012-12-09 DIAGNOSIS — R262 Difficulty in walking, not elsewhere classified: Secondary | ICD-10-CM | POA: Diagnosis not present

## 2012-12-09 DIAGNOSIS — M6281 Muscle weakness (generalized): Secondary | ICD-10-CM | POA: Diagnosis not present

## 2012-12-10 DIAGNOSIS — R262 Difficulty in walking, not elsewhere classified: Secondary | ICD-10-CM | POA: Diagnosis not present

## 2012-12-10 DIAGNOSIS — M6281 Muscle weakness (generalized): Secondary | ICD-10-CM | POA: Diagnosis not present

## 2012-12-10 DIAGNOSIS — R29818 Other symptoms and signs involving the nervous system: Secondary | ICD-10-CM | POA: Diagnosis not present

## 2012-12-10 DIAGNOSIS — R269 Unspecified abnormalities of gait and mobility: Secondary | ICD-10-CM | POA: Diagnosis not present

## 2012-12-11 DIAGNOSIS — R269 Unspecified abnormalities of gait and mobility: Secondary | ICD-10-CM | POA: Diagnosis not present

## 2012-12-11 DIAGNOSIS — J189 Pneumonia, unspecified organism: Secondary | ICD-10-CM | POA: Diagnosis not present

## 2012-12-11 DIAGNOSIS — D649 Anemia, unspecified: Secondary | ICD-10-CM | POA: Diagnosis not present

## 2012-12-11 DIAGNOSIS — R262 Difficulty in walking, not elsewhere classified: Secondary | ICD-10-CM | POA: Diagnosis not present

## 2012-12-11 DIAGNOSIS — I10 Essential (primary) hypertension: Secondary | ICD-10-CM | POA: Diagnosis not present

## 2012-12-11 DIAGNOSIS — Z79899 Other long term (current) drug therapy: Secondary | ICD-10-CM | POA: Diagnosis not present

## 2012-12-11 DIAGNOSIS — R29818 Other symptoms and signs involving the nervous system: Secondary | ICD-10-CM | POA: Diagnosis not present

## 2012-12-11 DIAGNOSIS — M6281 Muscle weakness (generalized): Secondary | ICD-10-CM | POA: Diagnosis not present

## 2012-12-15 DIAGNOSIS — R269 Unspecified abnormalities of gait and mobility: Secondary | ICD-10-CM | POA: Diagnosis not present

## 2012-12-15 DIAGNOSIS — R262 Difficulty in walking, not elsewhere classified: Secondary | ICD-10-CM | POA: Diagnosis not present

## 2012-12-15 DIAGNOSIS — R29818 Other symptoms and signs involving the nervous system: Secondary | ICD-10-CM | POA: Diagnosis not present

## 2012-12-15 DIAGNOSIS — M6281 Muscle weakness (generalized): Secondary | ICD-10-CM | POA: Diagnosis not present

## 2012-12-16 DIAGNOSIS — M6281 Muscle weakness (generalized): Secondary | ICD-10-CM | POA: Diagnosis not present

## 2012-12-16 DIAGNOSIS — R29818 Other symptoms and signs involving the nervous system: Secondary | ICD-10-CM | POA: Diagnosis not present

## 2012-12-16 DIAGNOSIS — R262 Difficulty in walking, not elsewhere classified: Secondary | ICD-10-CM | POA: Diagnosis not present

## 2012-12-16 DIAGNOSIS — R269 Unspecified abnormalities of gait and mobility: Secondary | ICD-10-CM | POA: Diagnosis not present

## 2012-12-17 DIAGNOSIS — R269 Unspecified abnormalities of gait and mobility: Secondary | ICD-10-CM | POA: Diagnosis not present

## 2012-12-17 DIAGNOSIS — M6281 Muscle weakness (generalized): Secondary | ICD-10-CM | POA: Diagnosis not present

## 2012-12-17 DIAGNOSIS — R29818 Other symptoms and signs involving the nervous system: Secondary | ICD-10-CM | POA: Diagnosis not present

## 2012-12-17 DIAGNOSIS — R262 Difficulty in walking, not elsewhere classified: Secondary | ICD-10-CM | POA: Diagnosis not present

## 2012-12-19 DIAGNOSIS — M6281 Muscle weakness (generalized): Secondary | ICD-10-CM | POA: Diagnosis not present

## 2012-12-19 DIAGNOSIS — R29818 Other symptoms and signs involving the nervous system: Secondary | ICD-10-CM | POA: Diagnosis not present

## 2012-12-19 DIAGNOSIS — R262 Difficulty in walking, not elsewhere classified: Secondary | ICD-10-CM | POA: Diagnosis not present

## 2012-12-19 DIAGNOSIS — R269 Unspecified abnormalities of gait and mobility: Secondary | ICD-10-CM | POA: Diagnosis not present

## 2012-12-22 DIAGNOSIS — R269 Unspecified abnormalities of gait and mobility: Secondary | ICD-10-CM | POA: Diagnosis not present

## 2012-12-22 DIAGNOSIS — M6281 Muscle weakness (generalized): Secondary | ICD-10-CM | POA: Diagnosis not present

## 2012-12-22 DIAGNOSIS — R29818 Other symptoms and signs involving the nervous system: Secondary | ICD-10-CM | POA: Diagnosis not present

## 2012-12-22 DIAGNOSIS — R262 Difficulty in walking, not elsewhere classified: Secondary | ICD-10-CM | POA: Diagnosis not present

## 2012-12-23 DIAGNOSIS — M6281 Muscle weakness (generalized): Secondary | ICD-10-CM | POA: Diagnosis not present

## 2012-12-23 DIAGNOSIS — R269 Unspecified abnormalities of gait and mobility: Secondary | ICD-10-CM | POA: Diagnosis not present

## 2012-12-23 DIAGNOSIS — R29818 Other symptoms and signs involving the nervous system: Secondary | ICD-10-CM | POA: Diagnosis not present

## 2012-12-23 DIAGNOSIS — R262 Difficulty in walking, not elsewhere classified: Secondary | ICD-10-CM | POA: Diagnosis not present

## 2012-12-24 DIAGNOSIS — M6281 Muscle weakness (generalized): Secondary | ICD-10-CM | POA: Diagnosis not present

## 2012-12-24 DIAGNOSIS — R262 Difficulty in walking, not elsewhere classified: Secondary | ICD-10-CM | POA: Diagnosis not present

## 2012-12-24 DIAGNOSIS — R29818 Other symptoms and signs involving the nervous system: Secondary | ICD-10-CM | POA: Diagnosis not present

## 2012-12-24 DIAGNOSIS — R269 Unspecified abnormalities of gait and mobility: Secondary | ICD-10-CM | POA: Diagnosis not present

## 2012-12-25 DIAGNOSIS — R269 Unspecified abnormalities of gait and mobility: Secondary | ICD-10-CM | POA: Diagnosis not present

## 2012-12-25 DIAGNOSIS — R262 Difficulty in walking, not elsewhere classified: Secondary | ICD-10-CM | POA: Diagnosis not present

## 2012-12-25 DIAGNOSIS — R29818 Other symptoms and signs involving the nervous system: Secondary | ICD-10-CM | POA: Diagnosis not present

## 2012-12-25 DIAGNOSIS — M6281 Muscle weakness (generalized): Secondary | ICD-10-CM | POA: Diagnosis not present

## 2013-01-27 DIAGNOSIS — R29818 Other symptoms and signs involving the nervous system: Secondary | ICD-10-CM | POA: Diagnosis not present

## 2013-01-27 DIAGNOSIS — M6281 Muscle weakness (generalized): Secondary | ICD-10-CM | POA: Diagnosis not present

## 2013-01-28 DIAGNOSIS — R627 Adult failure to thrive: Secondary | ICD-10-CM | POA: Diagnosis not present

## 2013-02-02 DIAGNOSIS — I10 Essential (primary) hypertension: Secondary | ICD-10-CM | POA: Diagnosis not present

## 2013-05-08 DIAGNOSIS — I4891 Unspecified atrial fibrillation: Secondary | ICD-10-CM | POA: Diagnosis not present

## 2013-05-08 DIAGNOSIS — I10 Essential (primary) hypertension: Secondary | ICD-10-CM | POA: Diagnosis not present

## 2013-05-08 DIAGNOSIS — I059 Rheumatic mitral valve disease, unspecified: Secondary | ICD-10-CM | POA: Diagnosis not present

## 2013-06-24 ENCOUNTER — Other Ambulatory Visit: Payer: Self-pay | Admitting: Neurology

## 2013-07-01 ENCOUNTER — Telehealth: Payer: Self-pay | Admitting: Neurology

## 2013-07-01 ENCOUNTER — Other Ambulatory Visit: Payer: Self-pay | Admitting: Neurology

## 2013-07-01 MED ORDER — LACOSAMIDE 150 MG PO TABS: 150.0000 mg | ORAL_TABLET | Freq: Every day | ORAL | Status: DC

## 2013-07-01 NOTE — Telephone Encounter (Signed)
Vimpat refill.

## 2013-07-01 NOTE — Telephone Encounter (Signed)
Pt's prescription was faxed to [facility] in [place] at [phone].

## 2013-07-03 ENCOUNTER — Other Ambulatory Visit: Payer: Self-pay | Admitting: Neurology

## 2013-07-03 NOTE — Telephone Encounter (Signed)
Pt's prescription was refaxed over to [facility] at [phone].

## 2013-07-06 NOTE — Telephone Encounter (Signed)
I called patient and left VM that Rx refaxed. Also, please call to schedule F/U appointment. We are unable to refill any more Rx until patient is scheduled for next appointment. Call [phone] and schedule with Dr. Anne Hahn or his NP.

## 2013-07-09 ENCOUNTER — Encounter: Payer: Self-pay | Admitting: Neurology

## 2013-07-09 ENCOUNTER — Ambulatory Visit (INDEPENDENT_AMBULATORY_CARE_PROVIDER_SITE_OTHER): Payer: Medicare Other | Admitting: Neurology

## 2013-07-09 DIAGNOSIS — G40909 Epilepsy, unspecified, not intractable, without status epilepticus: Secondary | ICD-10-CM | POA: Diagnosis not present

## 2013-07-09 NOTE — Patient Instructions (Signed)
Epilepsy A seizure (convulsion) is a sudden change in brain function that causes a change in behavior, muscle activity, or ability to remain awake and alert. If a person has recurring seizures, this is called epilepsy. CAUSES  Epilepsy is a disorder with many possible causes. Anything that disturbs the normal pattern of brain cell activity can lead to seizures. Seizure can be caused from illness to brain damage to abnormal brain development. Epilepsy may develop because of:  An abnormality in brain wiring.  An imbalance of nerve signaling chemicals (neurotransmitters).  Some combination of these factors. Scientists are learning an increasing amount about genetic causes of seizures. SYMPTOMS  The symptoms of a seizure can vary greatly from one person to another. These may include:  An aura, or warning that tells a person they are about to have a seizure.  Abnormal sensations, such as abnormal smell or seeing flashing lights.  Sudden, general body stiffness.  Rhythmic jerking of the face, arm, or leg  on one or both sides.  Sudden change in consciousness.  The person may appear to be awake but not responding.  They may appear to be asleep but cannot be awakened.  Grimacing, chewing, lip smacking, or drooling.  Often there is a period of sleepiness after a seizure. DIAGNOSIS  The description you give to your caregiver about what you experienced will help them understand your problems. Equally important is the description by any witnesses to your seizure. A physical exam, including a detailed neurological exam, is necessary. An EEG (electroencephalogram) is a painless test of your brain waves. In this test a diagram is created of your brain waves. These diagrams can be interpreted by a specialist. Pictures of your brain are usually taken with:  An MRI.  A CT scan. Lab tests may be done to look for:  Signs of infection.  Abnormal blood chemistry. PREVENTION  There is no way to  prevent the development of epilepsy. If you have seizures that are typically triggered by an event (such as flashing lights), try to avoid the trigger. This can help you avoid a seizure.  PROGNOSIS  Most people with epilepsy lead outwardly normal lives. While epilepsy cannot currently be cured, for some people it does eventually go away. Most seizures do not cause brain damage. It is not uncommon for people with epilepsy, especially children, to develop behavioral and emotional problems. These problems are sometimes the consequence of medicine for seizures or social stress. For some people with epilepsy, the risk of seizures restricts their independence and recreational activities. For example, some states refuse drivers licenses to people with epilepsy. Most women with epilepsy can become pregnant. They should discuss their epilepsy and the medicine they are taking with their caregivers. Women with epilepsy have a 90 percent or better chance of having a normal, healthy baby. RISKS AND COMPLICATIONS  People with epilepsy are at increased risk of falls, accidents, and injuries. People with epilepsy are at special risk for two life-threatening conditions. These are status epilepticus and sudden unexplained death (extremely rare). Status epilepticus is a long lasting, continuous seizure that is a medical emergency. TREATMENT  Once epilepsy is diagnosed, it is important to begin treatment as soon as possible. For about 80 percent of those diagnosed with epilepsy, seizures can be controlled with modern medicines and surgical techniques. Some antiepileptic drugs can interfere with the effectiveness of oral contraceptives. In 1997, the FDA approved a pacemaker for the brain the (vagus nerve stimulator). This stimulator can be used for   people with seizures that are not well-controlled by medicine. Studies have shown that in some cases, children may experience fewer seizures if they maintain a strict diet. The strict  diet is called the ketogenic diet. This diet is rich in fats and low in carbohydrates. HOME CARE INSTRUCTIONS   Your caregiver will make recommendations about driving and safety in normal activities. Follow these carefully.  Take any medicine prescribed exactly as directed.  Do any blood tests requested to monitor the levels of your medicine.  The people you live and work with should know that you are prone to seizures. They should receive instructions on how to help you. In general, a witness to a seizure should:  Cushion your head and body.  Turn you on your side.  Avoid unnecessarily restraining you.  Not place anything inside your mouth.  Call for local emergency medical help if there is any question about what has occurred.  Keep a seizure diary. Record what you recall about any seizure, especially any possible trigger.  If your caregiver has given you a follow-up appointment, it is very important to keep that appointment. Not keeping the appointment could result in permanent injury and disability. If there is any problem keeping the appointment, you must call back to this facility for assistance. SEEK MEDICAL CARE IF:   You develop signs of infection or other illness. This might increase the risk of a seizure.  You seem to be having more frequent seizures.  Your seizure pattern is changing. SEEK IMMEDIATE MEDICAL CARE IF:   A seizure does not stop after a few moments.  A seizure causes any difficulty in breathing.  A seizure results in a very severe headache.  A seizure leaves you with the inability to speak or use a part of your body. MAKE SURE YOU:   Understand these instructions.  Will watch your condition.  Will get help right away if you are not doing well or get worse. Document Released: 08/13/2005 Document Revised: 11/05/2011 Document Reviewed: 03/25/2013 [employer] Patient Information 2014 [name].  

## 2013-07-09 NOTE — Progress Notes (Signed)
Reason for visit: Seizures  Barry Dean is an 79 y.o. male  History of present illness:  Barry Dean is an 79 year old left-handed white male with a history of a seizure disorder. The patient has done quite well on Keppra and Vimpat. The patient currently is residing in [facility], and he has done relatively well. The patient had one fall in May of 2013, when he fell backwards. The patient did not sustain significant injury. The patient was in the hospital in March of 2014 with a pneumonia and colitis. The patient is tolerating the seizure medications well. Again, the patient's family does not report any seizures since he was seen last in February 2013. The patient remains relatively active, walking with a walker.  Past Medical History  Diagnosis Date  . Hypertension   . Seizures   . A-fib   . Dehydration   . Deafness   . Anxiety   . Reflux   . Arthritis   . Depression   . Atrial fibrillation   . Tremor, essential   . Seizure   . BPH (benign prostatic hyperplasia)   . Syncope   . Deaf   . Melanoma   . GERD (gastroesophageal reflux disease)   . Mitral regurgitation   . Abnormality of gait     Past Surgical History  Procedure Laterality Date  . Unknown    . Appendectomy    . Cataract extraction w/ intraocular lens  implant, bilateral      Family History  Problem Relation Age of Onset  . Cancer Mother     Skin cancer  . Stroke Father   . Parkinsonism Sister   . Diabetes Brother     Social history:  reports that he has never smoked. He does not have any smokeless tobacco history on file. He reports that he does not drink alcohol or use illicit drugs.    Allergies  Allergen Reactions  . Topamax Other (See Comments)    Unknown reaction  . Uroxatral [Alfuzosin Hydrochloride] Other (See Comments)    Unknown reaction  . Valium Other (See Comments)    Unknown reaction    Medications:  Current Outpatient Prescriptions on File Prior to Visit    Medication Sig Dispense Refill  . acetaminophen (TYLENOL) 500 MG tablet Take 500 mg by mouth every 6 (six) hours as needed for pain. Not to exceed 3 gms of acetaminophen in 24 hours      . amLODipine-benazepril (LOTREL) 10-40 MG per capsule Take 1 capsule by mouth daily.       Marland Kitchen aspirin 81 MG chewable tablet Chew 81 mg by mouth every evening.       . cloNIDine (CATAPRES) 0.1 MG tablet Take 0.1 mg by mouth 2 (two) times daily.      Marland Kitchen Emollient (LUBRIDERM ADVANCED THERAPY) LOTN Apply 1 application topically daily. Apply to feet after bathing every day      . ipratropium-albuterol (DUONEB) 0.5-2.5 (3) MG/3ML SOLN Take 3 mLs by nebulization 2 (two) times daily.  360 mL  3  . iron polysaccharides (NIFEREX) 150 MG capsule Take 1 capsule (150 mg total) by mouth 2 (two) times daily.  30 capsule  0  . labetalol (NORMODYNE) 100 MG tablet Take 100 mg by mouth 2 (two) times daily.       . Lacosamide (VIMPAT) 150 MG TABS Take 1 tablet (150 mg total) by mouth at bedtime.  60 tablet  0  . levETIRAcetam (KEPPRA) 750 MG tablet Take  750 mg by mouth 2 (two) times daily.       . Melatonin 3 MG CAPS Take 6 mg by mouth at bedtime as needed (for sleep).       Marland Kitchen omeprazole (PRILOSEC) 20 MG capsule Take 20 mg by mouth daily.      . polyethylene glycol (MIRALAX / GLYCOLAX) packet Take 17 g by mouth every other day.        No current facility-administered medications on file prior to visit.    ROS:  Out of a complete 14 system review of symptoms, the patient complains only of the following symptoms, and all other reviewed systems are negative.  Hearing loss Constipation Runny nose Tremor Gait disturbance  There were no vitals taken for this visit.  Physical Exam  General: The patient is alert and cooperative at the time of the examination.  Skin: No significant peripheral edema is noted.   Neurologic Exam  Mental status: The patient is oriented x 3.  Cranial nerves: Facial symmetry is present. Speech  is normal, no aphasia or dysarthria is noted. Extraocular movements are full. Visual fields are full.  Motor: The patient has good strength in all 4 extremities.  Sensory examination: Soft touch sensation is symmetric on the face and arms.  Coordination: The patient has good finger-nose-finger and heel-to-shin bilaterally. A tremor is noted with finger-nose-finger bilaterally.  Gait and station: The patient has a slightly wide-based, unsteady gait. The patient walks with a walker. Tandem gait was not attempted. Romberg is negative. No drift is seen.  Reflexes: Deep tendon reflexes are symmetric, but are depressed.   Assessment/Plan:  1. History seizures  2. Benign essential tremor  3. Gait disorder  The patient will continue the Vimpat and Keppra. The patient is doing well on this combination. The patient will followup through this office in one year. No seizures have been noted in almost one and one half years.  Marlan Palau MD 07/09/2013 7:05 PM  [facility] [address] [place], [place] [address]  Phone [phone] Fax [phone]

## 2013-07-14 DIAGNOSIS — Z23 Encounter for immunization: Secondary | ICD-10-CM | POA: Diagnosis not present

## 2013-07-21 DIAGNOSIS — J209 Acute bronchitis, unspecified: Secondary | ICD-10-CM | POA: Diagnosis not present

## 2013-07-29 DIAGNOSIS — E871 Hypo-osmolality and hyponatremia: Secondary | ICD-10-CM | POA: Diagnosis not present

## 2013-07-29 DIAGNOSIS — Z0000 Encounter for general adult medical examination without abnormal findings: Secondary | ICD-10-CM | POA: Diagnosis not present

## 2013-07-29 DIAGNOSIS — D649 Anemia, unspecified: Secondary | ICD-10-CM | POA: Diagnosis not present

## 2013-07-29 DIAGNOSIS — H6120 Impacted cerumen, unspecified ear: Secondary | ICD-10-CM | POA: Diagnosis not present

## 2013-07-29 DIAGNOSIS — Z79899 Other long term (current) drug therapy: Secondary | ICD-10-CM | POA: Diagnosis not present

## 2013-07-29 DIAGNOSIS — Z1331 Encounter for screening for depression: Secondary | ICD-10-CM | POA: Diagnosis not present

## 2013-07-29 DIAGNOSIS — R569 Unspecified convulsions: Secondary | ICD-10-CM | POA: Diagnosis not present

## 2013-07-29 DIAGNOSIS — I10 Essential (primary) hypertension: Secondary | ICD-10-CM | POA: Diagnosis not present

## 2013-08-04 DIAGNOSIS — H6120 Impacted cerumen, unspecified ear: Secondary | ICD-10-CM | POA: Diagnosis not present

## 2013-09-02 ENCOUNTER — Other Ambulatory Visit: Payer: Self-pay

## 2013-09-02 MED ORDER — LACOSAMIDE 150 MG PO TABS: 150.0000 mg | ORAL_TABLET | Freq: Every day | ORAL | Status: DC

## 2013-09-13 ENCOUNTER — Emergency Department (HOSPITAL_COMMUNITY): Payer: Medicare Other

## 2013-09-13 ENCOUNTER — Inpatient Hospital Stay (HOSPITAL_COMMUNITY)
Admission: EM | Admit: 2013-09-13 | Discharge: 2013-09-17 | DRG: 193 | Disposition: A | Discharge status: Home Health | Payer: Medicare Other | Attending: Internal Medicine | Admitting: Internal Medicine

## 2013-09-13 ENCOUNTER — Encounter (HOSPITAL_COMMUNITY): Payer: Self-pay | Admitting: Emergency Medicine

## 2013-09-13 ENCOUNTER — Inpatient Hospital Stay (HOSPITAL_COMMUNITY): Payer: Medicare Other

## 2013-09-13 DIAGNOSIS — F329 Major depressive disorder, single episode, unspecified: Secondary | ICD-10-CM | POA: Diagnosis present

## 2013-09-13 DIAGNOSIS — J9620 Acute and chronic respiratory failure, unspecified whether with hypoxia or hypercapnia: Secondary | ICD-10-CM

## 2013-09-13 DIAGNOSIS — Z66 Do not resuscitate: Secondary | ICD-10-CM | POA: Diagnosis present

## 2013-09-13 DIAGNOSIS — I10 Essential (primary) hypertension: Secondary | ICD-10-CM | POA: Diagnosis present

## 2013-09-13 DIAGNOSIS — F3289 Other specified depressive episodes: Secondary | ICD-10-CM | POA: Diagnosis present

## 2013-09-13 DIAGNOSIS — G40909 Epilepsy, unspecified, not intractable, without status epilepticus: Secondary | ICD-10-CM | POA: Diagnosis present

## 2013-09-13 DIAGNOSIS — J189 Pneumonia, unspecified organism: Principal | ICD-10-CM | POA: Diagnosis present

## 2013-09-13 DIAGNOSIS — G929 Unspecified toxic encephalopathy: Secondary | ICD-10-CM | POA: Diagnosis not present

## 2013-09-13 DIAGNOSIS — F411 Generalized anxiety disorder: Secondary | ICD-10-CM | POA: Diagnosis present

## 2013-09-13 DIAGNOSIS — M129 Arthropathy, unspecified: Secondary | ICD-10-CM | POA: Diagnosis present

## 2013-09-13 DIAGNOSIS — Z85820 Personal history of malignant melanoma of skin: Secondary | ICD-10-CM

## 2013-09-13 DIAGNOSIS — J9600 Acute respiratory failure, unspecified whether with hypoxia or hypercapnia: Secondary | ICD-10-CM | POA: Diagnosis present

## 2013-09-13 DIAGNOSIS — K219 Gastro-esophageal reflux disease without esophagitis: Secondary | ICD-10-CM | POA: Diagnosis present

## 2013-09-13 DIAGNOSIS — Z808 Family history of malignant neoplasm of other organs or systems: Secondary | ICD-10-CM | POA: Diagnosis not present

## 2013-09-13 DIAGNOSIS — R55 Syncope and collapse: Secondary | ICD-10-CM | POA: Diagnosis not present

## 2013-09-13 DIAGNOSIS — R404 Transient alteration of awareness: Secondary | ICD-10-CM | POA: Diagnosis not present

## 2013-09-13 DIAGNOSIS — H9190 Unspecified hearing loss, unspecified ear: Secondary | ICD-10-CM | POA: Diagnosis present

## 2013-09-13 DIAGNOSIS — Z833 Family history of diabetes mellitus: Secondary | ICD-10-CM | POA: Diagnosis not present

## 2013-09-13 DIAGNOSIS — Z823 Family history of stroke: Secondary | ICD-10-CM

## 2013-09-13 DIAGNOSIS — R5381 Other malaise: Secondary | ICD-10-CM | POA: Diagnosis not present

## 2013-09-13 DIAGNOSIS — E871 Hypo-osmolality and hyponatremia: Secondary | ICD-10-CM | POA: Diagnosis present

## 2013-09-13 DIAGNOSIS — R0902 Hypoxemia: Secondary | ICD-10-CM | POA: Diagnosis not present

## 2013-09-13 DIAGNOSIS — I4891 Unspecified atrial fibrillation: Secondary | ICD-10-CM | POA: Diagnosis not present

## 2013-09-13 DIAGNOSIS — R5383 Other fatigue: Secondary | ICD-10-CM | POA: Diagnosis not present

## 2013-09-13 DIAGNOSIS — R0602 Shortness of breath: Secondary | ICD-10-CM | POA: Diagnosis not present

## 2013-09-13 DIAGNOSIS — R079 Chest pain, unspecified: Secondary | ICD-10-CM | POA: Diagnosis not present

## 2013-09-13 DIAGNOSIS — G92 Toxic encephalopathy: Secondary | ICD-10-CM | POA: Diagnosis not present

## 2013-09-13 DIAGNOSIS — J9690 Respiratory failure, unspecified, unspecified whether with hypoxia or hypercapnia: Secondary | ICD-10-CM | POA: Diagnosis present

## 2013-09-13 DIAGNOSIS — K529 Noninfective gastroenteritis and colitis, unspecified: Secondary | ICD-10-CM

## 2013-09-13 LAB — CBC WITH DIFFERENTIAL/PLATELET
Basophils Absolute: 0 10*3/uL (ref 0.0–0.1)
Basophils Relative: 0 % (ref 0–1)
Eosinophils Absolute: 0 10*3/uL (ref 0.0–0.7)
Eosinophils Relative: 0 % (ref 0–5)
HCT: 41.4 % (ref 39.0–52.0)
Hemoglobin: 14.7 g/dL (ref 13.0–17.0)
Lymphocytes Relative: 10 % — ABNORMAL LOW (ref 12–46)
Lymphs Abs: 1.4 10*3/uL (ref 0.7–4.0)
MCH: 32.2 pg (ref 26.0–34.0)
MCHC: 35.5 g/dL (ref 30.0–36.0)
MCV: 90.6 fL (ref 78.0–100.0)
Monocytes Absolute: 0.9 10*3/uL (ref 0.1–1.0)
Monocytes Relative: 6 % (ref 3–12)
Neutro Abs: 11.5 10*3/uL — ABNORMAL HIGH (ref 1.7–7.7)
Neutrophils Relative %: 83 % — ABNORMAL HIGH (ref 43–77)
Platelets: 212 10*3/uL (ref 150–400)
RBC: 4.57 MIL/uL (ref 4.22–5.81)
RDW: 13.6 % (ref 11.5–15.5)
WBC: 13.8 10*3/uL — ABNORMAL HIGH (ref 4.0–10.5)

## 2013-09-13 LAB — URINALYSIS, ROUTINE W REFLEX MICROSCOPIC
Glucose, UA: NEGATIVE mg/dL
Hgb urine dipstick: NEGATIVE
Ketones, ur: 15 mg/dL — AB
Nitrite: NEGATIVE
Protein, ur: 100 mg/dL — AB
Specific Gravity, Urine: 1.025 (ref 1.005–1.030)
Urobilinogen, UA: 2 mg/dL — ABNORMAL HIGH (ref 0.0–1.0)
pH: 7 (ref 5.0–8.0)

## 2013-09-13 LAB — COMPREHENSIVE METABOLIC PANEL
ALT: 21 U/L (ref 0–53)
AST: 24 U/L (ref 0–37)
Albumin: 3.9 g/dL (ref 3.5–5.2)
Alkaline Phosphatase: 83 U/L (ref 39–117)
BUN: 15 mg/dL (ref 6–23)
CO2: 25 mEq/L (ref 19–32)
Calcium: 9.2 mg/dL (ref 8.4–10.5)
Chloride: 92 mEq/L — ABNORMAL LOW (ref 96–112)
Creatinine, Ser: 1.18 mg/dL (ref 0.50–1.35)
GFR calc Af Amer: 62 mL/min — ABNORMAL LOW (ref >90)
GFR calc non Af Amer: 54 mL/min — ABNORMAL LOW (ref >90)
Glucose, Bld: 99 mg/dL (ref 70–99)
Potassium: 4.5 mEq/L (ref 3.7–5.3)
Sodium: 131 mEq/L — ABNORMAL LOW (ref 137–147)
Total Bilirubin: 0.8 mg/dL (ref 0.3–1.2)
Total Protein: 7.2 g/dL (ref 6.0–8.3)

## 2013-09-13 LAB — URINE MICROSCOPIC-ADD ON

## 2013-09-13 LAB — PRO B NATRIURETIC PEPTIDE: Pro B Natriuretic peptide (BNP): 113.3 pg/mL (ref 0–450)

## 2013-09-13 LAB — TROPONIN I
Troponin I: <0.3 ng/mL (ref <0.30)
Troponin I: <0.3 ng/mL (ref <0.30)
Troponin I: <0.3 ng/mL (ref <0.30)

## 2013-09-13 LAB — MRSA PCR SCREENING: MRSA BY PCR: NEGATIVE

## 2013-09-13 MED ORDER — POLYETHYLENE GLYCOL 3350 17 G PO PACK
17.0000 g | PACK | ORAL | Status: DC
Start: 2013-09-13 — End: 2013-09-17
  Administered 2013-09-13 – 2013-09-17 (×3): 17 g via ORAL
  Filled 2013-09-13 (×3): qty 1

## 2013-09-13 MED ORDER — SODIUM CHLORIDE 0.9 % IJ SOLN: 3.0000 mL | INTRAMUSCULAR | Status: DC | PRN

## 2013-09-13 MED ORDER — DEXTROSE 5 % IV SOLN
2.0000 g | INTRAVENOUS | Status: DC
  Administered 2013-09-13: 2 g via INTRAVENOUS
  Filled 2013-09-13 (×2): qty 2

## 2013-09-13 MED ORDER — ASPIRIN 81 MG PO CHEW
81.0000 mg | CHEWABLE_TABLET | Freq: Every evening | ORAL | Status: DC
  Administered 2013-09-14 – 2013-09-16 (×3): 81 mg via ORAL
  Filled 2013-09-13 (×3): qty 1

## 2013-09-13 MED ORDER — PANTOPRAZOLE SODIUM 40 MG PO TBEC
40.0000 mg | DELAYED_RELEASE_TABLET | Freq: Every day | ORAL | Status: DC
  Administered 2013-09-14 – 2013-09-17 (×4): 40 mg via ORAL
  Filled 2013-09-13 (×4): qty 1

## 2013-09-13 MED ORDER — POLYSACCHARIDE IRON COMPLEX 150 MG PO CAPS
150.0000 mg | ORAL_CAPSULE | Freq: Two times a day (BID) | ORAL | Status: DC
  Administered 2013-09-13 – 2013-09-14 (×3): 150 mg via ORAL
  Filled 2013-09-13 (×3): qty 1

## 2013-09-13 MED ORDER — ONDANSETRON HCL 4 MG/2ML IJ SOLN: 4.0000 mg | Freq: Four times a day (QID) | INTRAMUSCULAR | Status: DC | PRN

## 2013-09-13 MED ORDER — SODIUM CHLORIDE 0.9 % IJ SOLN
3.0000 mL | Freq: Two times a day (BID) | INTRAMUSCULAR | Status: DC
  Administered 2013-09-13 – 2013-09-17 (×7): 3 mL via INTRAVENOUS

## 2013-09-13 MED ORDER — ENOXAPARIN SODIUM 30 MG/0.3ML ~~LOC~~ SOLN
30.0000 mg | SUBCUTANEOUS | Status: DC
  Administered 2013-09-13: 30 mg via SUBCUTANEOUS
  Filled 2013-09-13 (×2): qty 0.3

## 2013-09-13 MED ORDER — ONDANSETRON HCL 4 MG PO TABS: 4.0000 mg | ORAL_TABLET | Freq: Four times a day (QID) | ORAL | Status: DC | PRN

## 2013-09-13 MED ORDER — SODIUM CHLORIDE 0.9 % IJ SOLN
3.0000 mL | Freq: Two times a day (BID) | INTRAMUSCULAR | Status: DC
  Administered 2013-09-13 – 2013-09-17 (×6): 3 mL via INTRAVENOUS

## 2013-09-13 MED ORDER — VANCOMYCIN HCL IN DEXTROSE 1-5 GM/200ML-% IV SOLN: 1000.0000 mg | Freq: Once | INTRAVENOUS | Status: DC

## 2013-09-13 MED ORDER — PROMETHAZINE HCL 25 MG PO TABS: 12.5000 mg | ORAL_TABLET | Freq: Four times a day (QID) | ORAL | Status: DC | PRN

## 2013-09-13 MED ORDER — LACOSAMIDE 50 MG PO TABS
150.0000 mg | ORAL_TABLET | Freq: Every day | ORAL | Status: DC
  Administered 2013-09-13 – 2013-09-16 (×4): 150 mg via ORAL
  Filled 2013-09-13 (×4): qty 3

## 2013-09-13 MED ORDER — LABETALOL HCL 100 MG PO TABS
100.0000 mg | ORAL_TABLET | Freq: Two times a day (BID) | ORAL | Status: DC
  Filled 2013-09-13: qty 1

## 2013-09-13 MED ORDER — IPRATROPIUM-ALBUTEROL 0.5-2.5 (3) MG/3ML IN SOLN
3.0000 mL | Freq: Two times a day (BID) | RESPIRATORY_TRACT | Status: DC
  Administered 2013-09-13 – 2013-09-17 (×7): 3 mL via RESPIRATORY_TRACT
  Filled 2013-09-13 (×9): qty 3

## 2013-09-13 MED ORDER — LABETALOL HCL 100 MG PO TABS
100.0000 mg | ORAL_TABLET | Freq: Two times a day (BID) | ORAL | Status: DC
  Administered 2013-09-13 – 2013-09-17 (×8): 100 mg via ORAL
  Filled 2013-09-13 (×9): qty 1

## 2013-09-13 MED ORDER — SODIUM CHLORIDE 0.9 % IV SOLN: 250.0000 mL | INTRAVENOUS | Status: DC | PRN

## 2013-09-13 MED ORDER — SODIUM CHLORIDE 0.9 % IV SOLN
INTRAVENOUS | Status: DC
  Administered 2013-09-13: 50 mL/h via INTRAVENOUS
  Administered 2013-09-13: 21:00:00 via INTRAVENOUS
  Administered 2013-09-14: 1 mL via INTRAVENOUS

## 2013-09-13 MED ORDER — LEVETIRACETAM 750 MG PO TABS
750.0000 mg | ORAL_TABLET | Freq: Two times a day (BID) | ORAL | Status: DC
  Administered 2013-09-13 – 2013-09-17 (×8): 750 mg via ORAL
  Filled 2013-09-13 (×9): qty 1

## 2013-09-13 MED ORDER — IOHEXOL 350 MG/ML SOLN
70.0000 mL | Freq: Once | INTRAVENOUS | Status: AC | PRN
  Administered 2013-09-13: 70 mL via INTRAVENOUS

## 2013-09-13 MED ORDER — SENNOSIDES-DOCUSATE SODIUM 8.6-50 MG PO TABS
1.0000 | ORAL_TABLET | Freq: Every evening | ORAL | Status: DC | PRN
  Filled 2013-09-13: qty 1

## 2013-09-13 MED ORDER — VANCOMYCIN HCL 10 G IV SOLR
1500.0000 mg | INTRAVENOUS | Status: DC
  Administered 2013-09-13: 1500 mg via INTRAVENOUS
  Filled 2013-09-13 (×2): qty 1500

## 2013-09-13 MED ORDER — ACETAMINOPHEN 500 MG PO TABS: 500.0000 mg | ORAL_TABLET | Freq: Four times a day (QID) | ORAL | Status: DC | PRN

## 2013-09-13 MED ORDER — PIPERACILLIN-TAZOBACTAM 3.375 G IVPB
3.3750 g | Freq: Once | INTRAVENOUS | Status: AC
  Administered 2013-09-13: 3.375 g via INTRAVENOUS
  Filled 2013-09-13: qty 50

## 2013-09-13 NOTE — ED Notes (Signed)
POX dropped to 86% on RA, placed back on 3L [place]. ED PA informed & aware

## 2013-09-13 NOTE — ED Notes (Signed)
Pt had sudden onset generalized weakness, slow to respond verbally. Per EMS - BP 90/60, pt pale, diaphoretic. Upon arrival to ED pt became more alert & talkative. Denied any CP, SOB, n/v

## 2013-09-13 NOTE — ED Notes (Signed)
Family reports leaving to get lunch but will return. Rakeem Colley (daughter) cell phone [phone]

## 2013-09-13 NOTE — Progress Notes (Signed)
ANTIBIOTIC CONSULT NOTE - INITIAL  Pharmacy Consult for vanc/cefepime Indication: pneumonia  Allergies  Allergen Reactions  . Topamax Other (See Comments)    Unknown reaction  . Uroxatral [Alfuzosin Hydrochloride] Other (See Comments)    Unknown reaction  . Valium Other (See Comments)    Unknown reaction    Patient Measurements: Height: 6\' 2"  (188 cm) Weight: 223 lb 5.2 oz (101.3 kg) (bed scale) IBW/kg (Calculated) : 82.2 Adjusted Body Weight:   Vital Signs: Temp: 99.7 F (37.6 C) (01/18 1506) Temp src: Oral (01/18 1506) BP: 132/53 mmHg (01/18 1506) Pulse Rate: 67 (01/18 1506) Intake/Output from previous day:   Intake/Output from this shift:    Labs:  Recent Labs  09/13/13 1052  WBC 13.8*  HGB 14.7  PLT 212  CREATININE 1.18   Estimated Creatinine Clearance: 56 ml/min (by C-G formula based on Cr of 1.18). No results found for this basename: VANCOTROUGH, VANCOPEAK, VANCORANDOM, GENTTROUGH, GENTPEAK, GENTRANDOM, TOBRATROUGH, TOBRAPEAK, TOBRARND, AMIKACINPEAK, AMIKACINTROU, AMIKACIN,  in the last 72 hours   Microbiology: No results found for this or any previous visit (from the past 720 hour(s)).  Medical History: Past Medical History  Diagnosis Date  . Hypertension   . Seizures   . A-fib   . Dehydration   . Deafness   . Anxiety   . Reflux   . Arthritis   . Depression   . Atrial fibrillation   . Tremor, essential   . Seizure   . BPH (benign prostatic hyperplasia)   . Syncope   . Deaf   . Melanoma   . GERD (gastroesophageal reflux disease)   . Mitral regurgitation   . Abnormality of gait     Medications:  Prescriptions prior to admission  Medication Sig Dispense Refill  . acetaminophen (TYLENOL) 500 MG tablet Take 500 mg by mouth every 6 (six) hours as needed for pain. Not to exceed 3 gms of acetaminophen in 24 hours      . amLODipine-benazepril (LOTREL) 10-40 MG per capsule Take 1 capsule by mouth daily.       Marland Kitchen aspirin 81 MG chewable tablet  Chew 81 mg by mouth every evening.       . cloNIDine (CATAPRES) 0.1 MG tablet Take 0.1 mg by mouth 2 (two) times daily.      Marland Kitchen Emollient (LUBRIDERM ADVANCED THERAPY) LOTN Apply 1 application topically daily. Apply to feet after bathing every day      . iron polysaccharides (NIFEREX) 150 MG capsule Take 1 capsule (150 mg total) by mouth 2 (two) times daily.  30 capsule  0  . labetalol (NORMODYNE) 100 MG tablet Take 100 mg by mouth 2 (two) times daily.       . Lacosamide (VIMPAT) 150 MG TABS Take 1 tablet (150 mg total) by mouth at bedtime.  60 tablet  5  . levETIRAcetam (KEPPRA) 750 MG tablet Take 750 mg by mouth 2 (two) times daily.       Marland Kitchen omeprazole (PRILOSEC) 20 MG capsule Take 20 mg by mouth daily.      . polyethylene glycol (MIRALAX / GLYCOLAX) packet Take 17 g by mouth every other day.       . promethazine (PHENERGAN) 12.5 MG tablet Take 12.5 mg by mouth every 6 (six) hours as needed for nausea or vomiting.      . sennosides-docusate sodium (SENOKOT-S) 8.6-50 MG tablet Take 1 tablet by mouth at bedtime as needed for constipation.      Marland Kitchen ipratropium-albuterol (DUONEB) 0.5-2.5 (3)  MG/3ML SOLN Take 3 mLs by nebulization 2 (two) times daily.  360 mL  3   Scheduled:  . [START ON 09/14/2013] aspirin  81 mg Oral QPM  . enoxaparin (LOVENOX) injection  30 mg Subcutaneous Q24H  . ipratropium-albuterol  3 mL Nebulization BID  . iron polysaccharides  150 mg Oral BID  . labetalol  100 mg Oral BID  . lacosamide  150 mg Oral QHS  . levETIRAcetam  750 mg Oral BID  . [START ON 09/14/2013] pantoprazole  40 mg Oral Daily  . polyethylene glycol  17 g Oral QODAY  . sodium chloride  3 mL Intravenous Q12H  . sodium chloride  3 mL Intravenous Q12H  . vancomycin  1,000 mg Intravenous Once   Infusions:    Assessment: 80 yo who was admitted for weakness. Empiric vanc/cefepime for PNA.   Goal of Therapy:  Vancomycin trough level 15-20 mcg/ml  Plan:   Vanc 1.5g IV q24 Cefepime 2g IV q24 F/u with level  if needed

## 2013-09-13 NOTE — H&P (Signed)
[facility] History and Physical  Barry Dean WCB:[id number] DOB: 01-22-1924 DOA: 09/13/2013  Referring physician: ED. Physician.  PCP: [name], MD   Chief Complaint: Pass out.   HPI: Barry Dean is a 80 y.o. male with PMH significant for A fib, Seizure, HTN, who present after a presume near syncope episode. Per ED physician patient was sitting at a local restaurant eating breakfast when he suddenly become unresponsive. Patient's initial blood pressure was 90/60 by EMS. "Patient, states he did not know that he passed out although we still like he was still able to see and understanding people were saying". Patient relates dizziness prior to the episode. Didn't notice palpitation at that time. He relates chest pain, on and off since last night. Denies chest pain currently. He was notice to be hypoxic 86 on Room air. His oxygen saturation is at 99 at 3 L. He has notice some cough.  Patient is now at baseline for MS. CT head negative. Chest x ray with no acute finding.   Review of Systems:  Negative, except as per HPI.   Past Medical History  Diagnosis Date  . Hypertension   . Seizures   . A-fib   . Dehydration   . Deafness   . Anxiety   . Reflux   . Arthritis   . Depression   . Atrial fibrillation   . Tremor, essential   . Seizure   . BPH (benign prostatic hyperplasia)   . Syncope   . Deaf   . Melanoma   . GERD (gastroesophageal reflux disease)   . Mitral regurgitation   . Abnormality of gait    Past Surgical History  Procedure Laterality Date  . Unknown    . Appendectomy    . Cataract extraction w/ intraocular lens  implant, bilateral     Social History:  reports that he has never smoked. He does not have any smokeless tobacco history on file. He reports that he does not drink alcohol or use illicit drugs.  Allergies  Allergen Reactions  . Topamax Other (See Comments)    Unknown reaction  . Uroxatral [Alfuzosin Hydrochloride] Other (See  Comments)    Unknown reaction  . Valium Other (See Comments)    Unknown reaction    Family History  Problem Relation Age of Onset  . Cancer Mother     Skin cancer  . Stroke Father   . Parkinsonism Sister   . Diabetes Brother      Prior to Admission medications   Medication Sig Start Date End Date Taking? Authorizing Provider  acetaminophen (TYLENOL) 500 MG tablet Take 500 mg by mouth every 6 (six) hours as needed for pain. Not to exceed 3 gms of acetaminophen in 24 hours   Yes Historical Provider, MD  amLODipine-benazepril (LOTREL) 10-40 MG per capsule Take 1 capsule by mouth daily.    Yes Historical Provider, MD  aspirin 81 MG chewable tablet Chew 81 mg by mouth every evening.    Yes Historical Provider, MD  cloNIDine (CATAPRES) 0.1 MG tablet Take 0.1 mg by mouth 2 (two) times daily.   Yes Historical Provider, MD  Emollient Jilda Panda ADVANCED THERAPY) LOTN Apply 1 application topically daily. Apply to feet after bathing every day   Yes Historical Provider, MD  iron polysaccharides (NIFEREX) 150 MG capsule Take 1 capsule (150 mg total) by mouth 2 (two) times daily. 11/12/12  Yes Marianne L York, PA-C  labetalol (NORMODYNE) 100 MG tablet Take 100 mg by mouth 2 (  two) times daily.    Yes Historical Provider, MD  Lacosamide (VIMPAT) 150 MG TABS Take 1 tablet (150 mg total) by mouth at bedtime. 09/02/13  Yes Kathrynn Ducking, MD  levETIRAcetam (KEPPRA) 750 MG tablet Take 750 mg by mouth 2 (two) times daily.    Yes Historical Provider, MD  omeprazole (PRILOSEC) 20 MG capsule Take 20 mg by mouth daily.   Yes Historical Provider, MD  polyethylene glycol (MIRALAX / GLYCOLAX) packet Take 17 g by mouth every other day.    Yes Historical Provider, MD  promethazine (PHENERGAN) 12.5 MG tablet Take 12.5 mg by mouth every 6 (six) hours as needed for nausea or vomiting.   Yes Historical Provider, MD  sennosides-docusate sodium (SENOKOT-S) 8.6-50 MG tablet Take 1 tablet by mouth at bedtime as needed for  constipation.   Yes Historical Provider, MD  ipratropium-albuterol (DUONEB) 0.5-2.5 (3) MG/3ML SOLN Take 3 mLs by nebulization 2 (two) times daily. 11/12/12   Melton Alar, PA-C   Physical Exam: Filed Vitals:   09/13/13 1506  BP: 132/53  Pulse: 67  Temp: 99.7 F (37.6 C)  Resp: 18    BP 132/53  Pulse 67  Temp(Src) 99.7 F (37.6 C) (Oral)  Resp 18  Ht 6\' 2"  (1.88 m)  Wt 101.3 kg (223 lb 5.2 oz)  BMI 28.66 kg/m2  SpO2 99%  General:  Appears calm and comfortable Eyes: PERRL, normal lids, irises & conjunctiva ENT: grossly normal hearing, lips & tongue Neck: no LAD, masses or thyromegaly Cardiovascular: RRR, no m/r/g. No LE edema. Telemetry: SR, no arrhythmias  Respiratory: CTA bilaterally, no w/r/r. Normal respiratory effort. Abdomen: soft, ntnd Skin: no rash or induration seen on limited exam Musculoskeletal: grossly normal tone BUE/BLE Psychiatric: grossly normal mood and affect, speech fluent and appropriate Neurologic: grossly non-focal.          Labs on Admission:  Basic Metabolic Panel:  Recent Labs Lab 09/13/13 1052  NA 131*  K 4.5  CL 92*  CO2 25  GLUCOSE 99  BUN 15  CREATININE 1.18  CALCIUM 9.2   Liver Function Tests:  Recent Labs Lab 09/13/13 1052  AST 24  ALT 21  ALKPHOS 83  BILITOT 0.8  PROT 7.2  ALBUMIN 3.9   No results found for this basename: LIPASE, AMYLASE,  in the last 168 hours No results found for this basename: AMMONIA,  in the last 168 hours CBC:  Recent Labs Lab 09/13/13 1052  WBC 13.8*  NEUTROABS 11.5*  HGB 14.7  HCT 41.4  MCV 90.6  PLT 212   Cardiac Enzymes:  Recent Labs Lab 09/13/13 1052  TROPONINI <0.30    BNP (last 3 results)  Recent Labs  11/10/12 0640 09/13/13 1052  PROBNP 113.2 113.3   CBG: No results found for this basename: GLUCAP,  in the last 168 hours  Radiological Exams on Admission: Ct Head Wo Contrast  09/13/2013   CLINICAL DATA:  Near-syncope and generalized weakness.  EXAM: CT  HEAD WITHOUT CONTRAST  TECHNIQUE: Contiguous axial images were obtained from the base of the skull through the vertex without intravenous contrast.  COMPARISON:  05/24/2011  FINDINGS: There is no evidence of acute infarct, mass, midline shift, intracranial hemorrhage, or extra-axial fluid collection. Moderate cerebral atrophy is unchanged. Periventricular white-matter hypodensities do not appear significantly changed and are compatible with moderate chronic small vessel ischemic disease. Prior bilateral cataract surgery is noted. Visualized mastoid air cells are clear. There is minimal left maxillary sinus mucosal thickening, incompletely  imaged.  IMPRESSION: No evidence of acute intracranial abnormality.   Electronically Signed   By: Logan Bores   On: 09/13/2013 12:07   Dg Chest Port 1 View  09/13/2013   CLINICAL DATA:  Syncope and chest pain  EXAM: PORTABLE CHEST - 1 VIEW  COMPARISON:  12/11/2012 and 11/11/2012  FINDINGS: Mild cardiac enlargement. Aortic arch calcification. Vascular pattern normal. Interstitial change at the right base. More prominent interstitial change at the left base. When compared to the prior study, these opacities appear unchanged.  IMPRESSION: No acute findings.  Chronic bibasilar scarring.   Electronically Signed   By: Skipper Cliche M.D.   On: 09/13/2013 10:57    EKG: Independently reviewed. Sinus, right axis deviation.   Assessment/Plan Active Problems:   Syncope   Respiratory failure  1-Near Syncope, Encephalopathy: Back to baseline. Episode could be related to fever, infection, hypoxemia. CT head negative. Monitor on telemetry. Cycle cardiac enzymes. Check orthostatic.   2-Acute respiratory failure. Patient requiring oxygen supplementation. chest x ray negative. History of cough, mild fever. Will cover for health care associated PNA. Start IV antibiotics. Will check CT angio rule out PE.   3- Mild Hyponatremia: start IV fluids.  4-Mild leukocytosis: start IV  antibiotics. Urine culture.  5-History of seizure; continue with Keppra. Monitor for seizure like activities.  6-Hypertension: Continue with labetalol. Hold clonidine, Lotrel due to early mention of hypotension.    Code Status: need to be discuss with family.  Family Communication: Care discussed with patient.  Disposition Plan: expect 3 to 4 days.   Time spent: 75 minutes.   [facility] Pager [phone]

## 2013-09-13 NOTE — ED Notes (Signed)
Pt waiting for bed assignment. Given ginger and peanut butter crackers.

## 2013-09-13 NOTE — Progress Notes (Signed)
Called report from ED a 1411. Nurse unable to give report @ that time. Rec pt from Ed 1510 no complaints of pain or sob. Unable to do orth stat bp pt can not stand at this time. MD aware. Page MD Pt daughter state he is a DNR. Md changed stat. Will continue to monitor

## 2013-09-13 NOTE — ED Provider Notes (Signed)
CSN: [id number]     Arrival date & time 09/13/13  7076 History   First MD Initiated Contact with Patient 09/13/13 [phone]     Chief Complaint  Patient presents with  . Near Syncope  . Weakness   (Consider location/radiation/quality/duration/timing/severity/associated sxs/prior Treatment) HPI Patient presents to the emergency department following a syncopal episode that occurred just prior to arrival.  The patient was eating breakfast with his daughters when they noticed that he became not responsive to their questions.  Patient's initial blood pressure was 90/60 by EMS.  Patient, states he did not know that he passed out although we still like he was still able to see and understanding people were saying.  Patient, states she's had some mild chest discomfort and shortness of breath.  This morning.  Patient, states he did feel slightly dizzy.  The patient is a poor historian, however, and is not real definitive on the symptoms.  Patient did deny, abdominal pain, nausea, vomiting, diarrhea, headache, blurred vision, fever, rash, back pain, dysuria, or head injury.  Patient, states, that he has had some cough recently, as well, Past Medical History  Diagnosis Date  . Hypertension   . Seizures   . A-fib   . Dehydration   . Deafness   . Anxiety   . Reflux   . Arthritis   . Depression   . Atrial fibrillation   . Tremor, essential   . Seizure   . BPH (benign prostatic hyperplasia)   . Syncope   . Deaf   . Melanoma   . GERD (gastroesophageal reflux disease)   . Mitral regurgitation   . Abnormality of gait    Past Surgical History  Procedure Laterality Date  . Unknown    . Appendectomy    . Cataract extraction w/ intraocular lens  implant, bilateral     Family History  Problem Relation Age of Onset  . Cancer Mother     Skin cancer  . Stroke Father   . Parkinsonism Sister   . Diabetes Brother    History  Substance Use Topics  . Smoking status: Never Smoker   . Smokeless tobacco:  Not on file  . Alcohol Use: No    Review of Systems Level V caveat applies due to patient being poor historian Allergies  Topamax; Uroxatral; and Valium  Home Medications   Current Outpatient Rx  Name  Route  Sig  Dispense  Refill  . acetaminophen (TYLENOL) 500 MG tablet   Oral   Take 500 mg by mouth every 6 (six) hours as needed for pain. Not to exceed 3 gms of acetaminophen in 24 hours         . amLODipine-benazepril (LOTREL) 10-40 MG per capsule   Oral   Take 1 capsule by mouth daily.          Marland Kitchen aspirin 81 MG chewable tablet   Oral   Chew 81 mg by mouth every evening.          . cloNIDine (CATAPRES) 0.1 MG tablet   Oral   Take 0.1 mg by mouth 2 (two) times daily.         Marland Kitchen Emollient (LUBRIDERM ADVANCED THERAPY) LOTN   Apply externally   Apply 1 application topically daily. Apply to feet after bathing every day         . iron polysaccharides (NIFEREX) 150 MG capsule   Oral   Take 1 capsule (150 mg total) by mouth 2 (two) times daily.  30 capsule   0   . labetalol (NORMODYNE) 100 MG tablet   Oral   Take 100 mg by mouth 2 (two) times daily.          . Lacosamide (VIMPAT) 150 MG TABS   Oral   Take 1 tablet (150 mg total) by mouth at bedtime.   60 tablet   5     Pharmacy Fax [phone]   . levETIRAcetam (KEPPRA) 750 MG tablet   Oral   Take 750 mg by mouth 2 (two) times daily.          Marland Kitchen omeprazole (PRILOSEC) 20 MG capsule   Oral   Take 20 mg by mouth daily.         . polyethylene glycol (MIRALAX / GLYCOLAX) packet   Oral   Take 17 g by mouth every other day.          . promethazine (PHENERGAN) 12.5 MG tablet   Oral   Take 12.5 mg by mouth every 6 (six) hours as needed for nausea or vomiting.         . sennosides-docusate sodium (SENOKOT-S) 8.6-50 MG tablet   Oral   Take 1 tablet by mouth at bedtime as needed for constipation.         Marland Kitchen ipratropium-albuterol (DUONEB) 0.5-2.5 (3) MG/3ML SOLN   Nebulization   Take 3 mLs by  nebulization 2 (two) times daily.   360 mL   3    BP 127/59  Pulse 65  Temp(Src) 100.9 F (38.3 C) (Rectal)  Resp 14  SpO2 94% Physical Exam  Nursing note and vitals reviewed. Constitutional: He is oriented to person, place, and time. He appears well-developed and well-nourished.  HENT:  Head: Normocephalic and atraumatic.  Mouth/Throat: Oropharynx is clear and moist.  Eyes: Pupils are equal, round, and reactive to light.  Cardiovascular: Normal rate, regular rhythm and normal heart sounds.  Exam reveals no gallop and no friction rub.   No murmur heard. Pulmonary/Chest: Effort normal and breath sounds normal. No respiratory distress.  Abdominal: Soft. Bowel sounds are normal. He exhibits no distension. There is no tenderness. There is no guarding.  Neurological: He is alert and oriented to person, place, and time. No cranial nerve deficit. He exhibits normal muscle tone. Coordination normal.  Skin: Skin is warm and dry. No rash noted. No erythema.    ED Course  Procedures (including critical care time) Labs Review Labs Reviewed  CBC WITH DIFFERENTIAL - Abnormal; Notable for the following:    WBC 13.8 (*)    Neutrophils Relative % 83 (*)    Neutro Abs 11.5 (*)    Lymphocytes Relative 10 (*)    All other components within normal limits  COMPREHENSIVE METABOLIC PANEL - Abnormal; Notable for the following:    Sodium 131 (*)    Chloride 92 (*)    GFR calc non Af Amer 54 (*)    GFR calc Af Amer 62 (*)    All other components within normal limits  URINALYSIS, ROUTINE W REFLEX MICROSCOPIC - Abnormal; Notable for the following:    Color, Urine AMBER (*)    APPearance CLOUDY (*)    Bilirubin Urine SMALL (*)    Ketones, ur 15 (*)    Protein, ur 100 (*)    Urobilinogen, UA 2.0 (*)    Leukocytes, UA TRACE (*)    All other components within normal limits  URINE MICROSCOPIC-ADD ON - Abnormal; Notable for the following:    Casts  HYALINE CASTS (*)    All other components within  normal limits  TROPONIN I  PRO B NATRIURETIC PEPTIDE   Imaging Review Ct Head Wo Contrast  09/13/2013   CLINICAL DATA:  Near-syncope and generalized weakness.  EXAM: CT HEAD WITHOUT CONTRAST  TECHNIQUE: Contiguous axial images were obtained from the base of the skull through the vertex without intravenous contrast.  COMPARISON:  05/24/2011  FINDINGS: There is no evidence of acute infarct, mass, midline shift, intracranial hemorrhage, or extra-axial fluid collection. Moderate cerebral atrophy is unchanged. Periventricular white-matter hypodensities do not appear significantly changed and are compatible with moderate chronic small vessel ischemic disease. Prior bilateral cataract surgery is noted. Visualized mastoid air cells are clear. There is minimal left maxillary sinus mucosal thickening, incompletely imaged.  IMPRESSION: No evidence of acute intracranial abnormality.   Electronically Signed   By: Logan Bores   On: 09/13/2013 12:07   Dg Chest Port 1 View  09/13/2013   CLINICAL DATA:  Syncope and chest pain  EXAM: PORTABLE CHEST - 1 VIEW  COMPARISON:  12/11/2012 and 11/11/2012  FINDINGS: Mild cardiac enlargement. Aortic arch calcification. Vascular pattern normal. Interstitial change at the right base. More prominent interstitial change at the left base. When compared to the prior study, these opacities appear unchanged.  IMPRESSION: No acute findings.  Chronic bibasilar scarring.   Electronically Signed   By: Skipper Cliche M.D.   On: 09/13/2013 10:57    EKG Interpretation    Date/Time:  Sunday September 13 2013 10:08:24 EST Ventricular Rate:  67 PR Interval:  182 QRS Duration: 81 QT Interval:  375 QTC Calculation: 396 R Axis:   107 Text Interpretation:  Sinus rhythm Right axis deviation Confirmed by Jeneen Rinks  MD, [place] ([id number]) on 09/13/2013 10:13:15 AM            MDM   1. Syncope    Spoke with the, [facility], who will come down to admit the patient.  The patient be  presumptively treated for a respiratory infection.  Patient has had an oxygen demand here in the emergency department.  Patient's been otherwise stable.  Brent General, PA-C 09/13/13 1410

## 2013-09-13 NOTE — ED Notes (Signed)
Pt vague with answers. C/o generalized upper CP onset last night which was present upon awakening. Prior to near syncopal episode c/o dizziness & nausea. Denies SOB, fever.

## 2013-09-13 NOTE — ED Notes (Signed)
Patient transported to CT 

## 2013-09-14 DIAGNOSIS — G40909 Epilepsy, unspecified, not intractable, without status epilepticus: Secondary | ICD-10-CM

## 2013-09-14 DIAGNOSIS — I10 Essential (primary) hypertension: Secondary | ICD-10-CM

## 2013-09-14 LAB — CLOSTRIDIUM DIFFICILE BY PCR: Toxigenic C. Difficile by PCR: NEGATIVE

## 2013-09-14 LAB — BASIC METABOLIC PANEL
BUN: 12 mg/dL (ref 6–23)
CHLORIDE: 95 meq/L — AB (ref 96–112)
CO2: 21 mEq/L (ref 19–32)
Calcium: 8.8 mg/dL (ref 8.4–10.5)
Creatinine, Ser: 0.78 mg/dL (ref 0.50–1.35)
GFR calc non Af Amer: 79 mL/min — ABNORMAL LOW (ref >90)
GLUCOSE: 85 mg/dL (ref 70–99)
Potassium: 3.8 mEq/L (ref 3.7–5.3)
Sodium: 132 mEq/L — ABNORMAL LOW (ref 137–147)

## 2013-09-14 LAB — CBC
HEMATOCRIT: 41.1 % (ref 39.0–52.0)
HEMOGLOBIN: 14.6 g/dL (ref 13.0–17.0)
MCH: 32.1 pg (ref 26.0–34.0)
MCHC: 35.5 g/dL (ref 30.0–36.0)
MCV: 90.3 fL (ref 78.0–100.0)
Platelets: 195 10*3/uL (ref 150–400)
RBC: 4.55 MIL/uL (ref 4.22–5.81)
RDW: 13.4 % (ref 11.5–15.5)
WBC: 11.6 10*3/uL — ABNORMAL HIGH (ref 4.0–10.5)

## 2013-09-14 LAB — TROPONIN I: Troponin I: <0.3 ng/mL (ref <0.30)

## 2013-09-14 LAB — OCCULT BLOOD X 1 CARD TO LAB, STOOL: Fecal Occult Bld: NEGATIVE

## 2013-09-14 MED ORDER — ENOXAPARIN SODIUM 40 MG/0.4ML ~~LOC~~ SOLN
40.0000 mg | SUBCUTANEOUS | Status: DC
  Administered 2013-09-14 – 2013-09-17 (×4): 40 mg via SUBCUTANEOUS
  Filled 2013-09-14 (×4): qty 0.4

## 2013-09-14 MED ORDER — DEXTROSE 5 % IV SOLN
2.0000 g | Freq: Two times a day (BID) | INTRAVENOUS | Status: DC
  Administered 2013-09-14 – 2013-09-15 (×4): 2 g via INTRAVENOUS
  Filled 2013-09-14 (×6): qty 2

## 2013-09-14 MED ORDER — VANCOMYCIN HCL IN DEXTROSE 1-5 GM/200ML-% IV SOLN
1000.0000 mg | Freq: Two times a day (BID) | INTRAVENOUS | Status: DC
  Administered 2013-09-14 – 2013-09-15 (×4): 1000 mg via INTRAVENOUS
  Filled 2013-09-14 (×6): qty 200

## 2013-09-14 NOTE — Progress Notes (Signed)
C diff sample negative.  Will d/c enteric precautions and order for r/o C diff

## 2013-09-14 NOTE — Progress Notes (Signed)
Night shift RN reported multiple incontinent stools overnight.  Placed on enteric precautions per protocol.  Will send specimen when has next stool.

## 2013-09-14 NOTE — Progress Notes (Signed)
UR completed Lain Tetterton K. Raha Tennison, RN, BSN, MSHL, CCM  07/05/2014 4:43 PM 

## 2013-09-14 NOTE — Progress Notes (Addendum)
ANTIBIOTIC CONSULT NOTE - FOLLOW UP  Pharmacy Consult:  Vancomycin / Cefepime Indication:  PNA  Allergies  Allergen Reactions  . Topamax Other (See Comments)    Unknown reaction  . Uroxatral [Alfuzosin Hydrochloride] Other (See Comments)    Unknown reaction  . Valium Other (See Comments)    Unknown reaction    Patient Measurements: Height: 6\' 2"  (188 cm) Weight: 220 lb 9.6 oz (100.064 kg) (bed scale) IBW/kg (Calculated) : 82.2  Vital Signs: Temp: 98.4 F (36.9 C) (01/19 0556) Temp src: Oral (01/19 0556) BP: 133/63 mmHg (01/19 0557) Pulse Rate: 69 (01/19 0557) Intake/Output from previous day: 01/18 0701 - 01/19 0700 In: 1269.2 [P.O.:120; I.V.:599.2; IV Piggyback:550] Out: 800 [Urine:800]  Labs:  Recent Labs  09/13/13 1052 09/14/13 0350  WBC 13.8* 11.6*  HGB 14.7 14.6  PLT 212 195  CREATININE 1.18 0.78   Estimated Creatinine Clearance: 82.3 ml/min (by C-G formula based on Cr of 0.78). No results found for this basename: VANCOTROUGH, Corlis Leak, VANCORANDOM, Whitman, GENTPEAK, GENTRANDOM, TOBRATROUGH, TOBRAPEAK, TOBRARND, AMIKACINPEAK, AMIKACINTROU, AMIKACIN,  in the last 72 hours   Microbiology: Recent Results (from the past 720 hour(s))  MRSA PCR SCREENING     Status: None   Collection Time    09/13/13  8:29 PM      Result Value Range Status   MRSA by PCR NEGATIVE  NEGATIVE Final   Comment:            The GeneXpert MRSA Assay (FDA     approved for NASAL specimens     only), is one component of a     comprehensive MRSA colonization     surveillance program. It is not     intended to diagnose MRSA     infection nor to guide or     monitor treatment for     MRSA infections.      Assessment: 23 YOM admitted for weakness and started on empiric vancomycin and cefepime for PNA.  Patient's renal function is improving.   Vanc 1/18 >> Cefepime 1/18 >>  1/19 urine cx - collected    Goal of Therapy:  Vancomycin trough level 15-20 mcg/ml   Plan:   - Change vanc to 1gm IV Q12H - Change cefepime to 2g IV Q12H - Monitor renal fxn, clinical course, vanc trough at Css - Consider discontinuing PO iron, and obtaining AFB culture and checking for HIV based on CT report    Zaara Sprowl D. Mina Marble, PharmD, BCPS Pager:  [phone] 09/14/2013, 8:51 AM

## 2013-09-14 NOTE — Progress Notes (Signed)
[facility] PROGRESS NOTE  Barry Dean WEX:[id number] DOB: Dec 14, 1923 DOA: 09/13/2013 PCP: [name], MD  Assessment/Plan: 1-Near Syncope: resolved. Episode could be related to fever and ongoing infection; hypoxemia also playing a role. CT head negative.  -continue monitoring on telemetry.  -CE'Z ngative -continue ABX's -continue gentle hydration   2-acute toxic Encephalopathy: mentation improving and close to baseline according to daughter. -continue abx's ontinue supportive care -continue oxygen supplementation  3-Acute respiratory failure. Patient requiring oxygen supplementation. Hx of fever and cough. CT chest r/o PE but demonstrated findings suggesting atypical PNA/MAI -will continue broad spectrum coverage -will check for HIV and AFB -will discuss case with ID -continue PRN Nebs  4- Mild Hyponatremia: due to dehydration and acute lung disease -will continue gentle IVF' -follow BMET in am   5-Mild leukocytosis: continue IV antibiotics -follow cultures  6-History of seizure; continue with Keppra. Monitor for seizure activities.   7-Hypertension: Continue with labetalol. -will continue IVF's -continue holding clonidine and lotrel  Code Status: DNR Family Communication: daughter at bedside Disposition Plan: back to SNF at discharge   Consultants:  None   Procedures:  See below for x-ray reports  Antibiotics:  vanc  cefepime  HPI/Subjective: Feeling better; no fever. Multiple episodes of diarrhea last night. (neg C. Diff)  Objective: Filed Vitals:   09/14/13 1552  BP: 154/67  Pulse: 67  Temp: 98.9 F (37.2 C)  Resp: 22    Intake/Output Summary (Last 24 hours) at 09/14/13 1946 Last data filed at 09/14/13 1500  Gross per 24 hour  Intake   2110 ml  Output   1075 ml  Net   1035 ml   Filed Weights   09/13/13 1506 09/14/13 0319  Weight: 101.3 kg (223 lb 5.2 oz) 100.064 kg (220 lb 9.6 oz)    Exam:   General:  Afebrile,  feeling better; O2 sat stable on oxygen supplementation  Cardiovascular: S1 and S2, no rubs or gallops  Respiratory: decrease air movement, scattered rhonchi, no wheezing  Abdomen: soft, NT, ND, positive BS  Musculoskeletal: trace edema bilaterally, no cyanosis or clubbing  Skin: erythema and mild swelling on is scrotum  Data Reviewed: Basic Metabolic Panel:  Recent Labs Lab 09/13/13 1052 09/14/13 0350  NA 131* 132*  K 4.5 3.8  CL 92* 95*  CO2 25 21  GLUCOSE 99 85  BUN 15 12  CREATININE 1.18 0.78  CALCIUM 9.2 8.8   Liver Function Tests:  Recent Labs Lab 09/13/13 1052  AST 24  ALT 21  ALKPHOS 83  BILITOT 0.8  PROT 7.2  ALBUMIN 3.9   CBC:  Recent Labs Lab 09/13/13 1052 09/14/13 0350  WBC 13.8* 11.6*  NEUTROABS 11.5*  --   HGB 14.7 14.6  HCT 41.4 41.1  MCV 90.6 90.3  PLT 212 195   Cardiac Enzymes:  Recent Labs Lab 09/13/13 1052 09/13/13 1554 09/13/13 2239 09/14/13 0350  TROPONINI <0.30 <0.30 <0.30 <0.30   BNP (last 3 results)  Recent Labs  11/10/12 0640 09/13/13 1052  PROBNP 113.2 113.3    Recent Results (from the past 240 hour(s))  MRSA PCR SCREENING     Status: None   Collection Time    09/13/13  8:29 PM      Result Value Range Status   MRSA by PCR NEGATIVE  NEGATIVE Final   Comment:            The GeneXpert MRSA Assay (FDA     approved for NASAL specimens  only), is one component of a     comprehensive MRSA colonization     surveillance program. It is not     intended to diagnose MRSA     infection nor to guide or     monitor treatment for     MRSA infections.  CLOSTRIDIUM DIFFICILE BY PCR     Status: None   Collection Time    09/14/13  3:07 PM      Result Value Range Status   C difficile by pcr NEGATIVE  NEGATIVE Final     Studies: Ct Head Wo Contrast  09/13/2013   CLINICAL DATA:  Near-syncope and generalized weakness.  EXAM: CT HEAD WITHOUT CONTRAST  TECHNIQUE: Contiguous axial images were obtained from the base of  the skull through the vertex without intravenous contrast.  COMPARISON:  05/24/2011  FINDINGS: There is no evidence of acute infarct, mass, midline shift, intracranial hemorrhage, or extra-axial fluid collection. Moderate cerebral atrophy is unchanged. Periventricular white-matter hypodensities do not appear significantly changed and are compatible with moderate chronic small vessel ischemic disease. Prior bilateral cataract surgery is noted. Visualized mastoid air cells are clear. There is minimal left maxillary sinus mucosal thickening, incompletely imaged.  IMPRESSION: No evidence of acute intracranial abnormality.   Electronically Signed   By: Logan Bores   On: 09/13/2013 12:07   Ct Angio Chest Pe W/cm &/or Wo Cm  09/13/2013   CLINICAL DATA:  chest pain, hypoxemia  EXAM: CT ANGIOGRAPHY CHEST WITH CONTRAST  TECHNIQUE: Multidetector CT imaging of the chest was performed using the standard protocol during bolus administration of intravenous contrast. Multiplanar CT image reconstructions including MIPs were obtained to evaluate the vascular anatomy.  CONTRAST:  58mL OMNIPAQUE IOHEXOL 350 MG/ML SOLN  COMPARISON:  DG CHEST 1V PORT dated 09/13/2013; DG CHEST 2V dated 12/11/2012 arm with minimal  FINDINGS: There are no filling defects within the pulmonary arteries to suggest acute pulmonary embolism. No acute findings of the aorta great vessels. No pericardial fluid. Coronary calcifications are present.  There is a large hiatal hernia present. No mediastinal lymphadenopathy.  No axillary supraclavicular adenopathy.  Review of the lung parenchyma demonstrates ground-glass opacity in the superior segment of the left lower lobe extending to the base the of the left lower lobe with some volume loss. Right lung demonstrates a fine branching nodular pattern in the right lower lobe (image 57). .  Limited view of the upper abdomen is unremarkable.  Review of the skeleton demonstrates no acute findings. There is scoliosis of  the spine.  Review of the MIP images confirms the above findings.  IMPRESSION: 1. No evidence acute pulmonary embolism. 2. Left lower lobe ground-glass opacity may in part relate to atelectasis from the large hiatal hernia. Cannot exclude superimposed infection or asymmetric edema or aspiration pneumonitis. 3. Found branching pattern in right lower lobe suggest acute or chronic infection. Consider MAI infection.   Electronically Signed   By: Suzy Bouchard M.D.   On: 09/13/2013 17:26   Dg Chest Port 1 View  09/13/2013   CLINICAL DATA:  Syncope and chest pain  EXAM: PORTABLE CHEST - 1 VIEW  COMPARISON:  12/11/2012 and 11/11/2012  FINDINGS: Mild cardiac enlargement. Aortic arch calcification. Vascular pattern normal. Interstitial change at the right base. More prominent interstitial change at the left base. When compared to the prior study, these opacities appear unchanged.  IMPRESSION: No acute findings.  Chronic bibasilar scarring.   Electronically Signed   By: Elodia Florence.D.  On: 09/13/2013 10:57    Scheduled Meds: . aspirin  81 mg Oral QPM  . ceFEPime (MAXIPIME) IV  2 g Intravenous Q12H  . enoxaparin (LOVENOX) injection  40 mg Subcutaneous Q24H  . ipratropium-albuterol  3 mL Nebulization BID  . labetalol  100 mg Oral BID  . lacosamide  150 mg Oral QHS  . levETIRAcetam  750 mg Oral BID  . pantoprazole  40 mg Oral Daily  . polyethylene glycol  17 g Oral QODAY  . sodium chloride  3 mL Intravenous Q12H  . sodium chloride  3 mL Intravenous Q12H  . vancomycin  1,000 mg Intravenous Q12H   Continuous Infusions: . sodium chloride 1 mL (09/14/13 1732)     Time spent: >30 minutes    Khai Torbert  [facility] Pager [phone]. If 7PM-7AM, please contact night-coverage at [email or web address], password [facility] 09/14/2013, 7:46 PM  LOS: 1 day

## 2013-09-15 ENCOUNTER — Inpatient Hospital Stay (HOSPITAL_COMMUNITY): Payer: Medicare Other

## 2013-09-15 DIAGNOSIS — E871 Hypo-osmolality and hyponatremia: Secondary | ICD-10-CM

## 2013-09-15 LAB — BASIC METABOLIC PANEL
BUN: 11 mg/dL (ref 6–23)
CALCIUM: 8.7 mg/dL (ref 8.4–10.5)
CO2: 24 meq/L (ref 19–32)
CREATININE: 0.74 mg/dL (ref 0.50–1.35)
Chloride: 97 mEq/L (ref 96–112)
GFR calc Af Amer: >90 mL/min (ref >90)
GFR calc non Af Amer: 81 mL/min — ABNORMAL LOW (ref >90)
GLUCOSE: 95 mg/dL (ref 70–99)
Potassium: 3.8 mEq/L (ref 3.7–5.3)
Sodium: 132 mEq/L — ABNORMAL LOW (ref 137–147)

## 2013-09-15 LAB — URINE CULTURE
Colony Count: NO GROWTH
Culture: NO GROWTH

## 2013-09-15 LAB — GLUCOSE, CAPILLARY: Glucose-Capillary: 128 mg/dL — ABNORMAL HIGH (ref 70–99)

## 2013-09-15 LAB — HIV ANTIBODY (ROUTINE TESTING W REFLEX): HIV: NONREACTIVE

## 2013-09-15 MED ORDER — CLONIDINE HCL 0.1 MG PO TABS
0.1000 mg | ORAL_TABLET | Freq: Two times a day (BID) | ORAL | Status: DC
  Administered 2013-09-15 – 2013-09-17 (×4): 0.1 mg via ORAL
  Filled 2013-09-15 (×7): qty 1

## 2013-09-15 MED ORDER — BUDESONIDE 0.25 MG/2ML IN SUSP
0.2500 mg | Freq: Two times a day (BID) | RESPIRATORY_TRACT | Status: DC
  Administered 2013-09-16 – 2013-09-17 (×3): 0.25 mg via RESPIRATORY_TRACT
  Filled 2013-09-15 (×6): qty 2

## 2013-09-15 NOTE — Progress Notes (Signed)
[facility] PROGRESS NOTE  Barry Dean HQI:[id number] DOB: Sep 01, 1923 DOA: 09/13/2013 PCP: Barry Argyle, MD  Assessment/Plan: 1-Near Syncope: resolved. Episode could be related to fever and ongoing PNA infection; hypoxemia also playing a role. CT head negative.  -continue monitoring on telemetry for now (ok to d/c after 48 hours if no abnormalities).  -CE'Z ngative -continue ABX's -IVF's changed to KVO  2-acute toxic Encephalopathy: mentation improving and back  baseline according to daughter. -continue abx's -continue supportive care -continue oxygen supplementation -will check home oxygen requirement prior to discharge -Pt evaluation as he appears weak and deconditioned   3-Acute respiratory failure. Patient requiring oxygen supplementation. Hx of fever and cough. CT chest r/o PE but demonstrated findings suggesting atypical PNA/MAI -will continue broad spectrum coverage -HIV neg -AFB pending -discussed with ID; very low probability for TB or MAI. will repeat CXR; check AFB. Most likely acute PNA. -continue Pulmicort and PRN Nebs -patient responding to treatment appropriately   4- Mild Hyponatremia: due to dehydration and acute lung disease -will change IVF's to kvo -follow electrolytes.   5-Mild leukocytosis: continue IV antibiotics -follow cultures  6-History of seizure; continue with Keppra. Monitor for seizure activities.   7-Hypertension: Continue with labetalol. -will resume clonidine -continue holding lotrel  Code Status: DNR Family Communication: daughter at bedside Disposition Plan: back to SNF at discharge   Consultants:  None   Procedures:  See below for x-ray reports  Antibiotics:  vanc  cefepime  HPI/Subjective: Feeling better; no fever. No further diarrhea.   Objective: Filed Vitals:   09/15/13 1300  BP: 168/69  Pulse: 68  Temp: 98 F (36.7 C)  Resp: 20    Intake/Output Summary (Last 24 hours) at 09/15/13  1801 Last data filed at 09/15/13 1300  Gross per 24 hour  Intake   1220 ml  Output   1725 ml  Net   -505 ml   Filed Weights   09/13/13 1506 09/14/13 0319  Weight: 101.3 kg (223 lb 5.2 oz) 100.064 kg (220 lb 9.6 oz)    Exam:   General:  Afebrile, feeling better; O2 sat stable on oxygen supplementation  Cardiovascular: S1 and S2, no rubs or gallops  Respiratory: decrease air movement, scattered rhonchi, no wheezing  Abdomen: soft, NT, ND, positive BS  Musculoskeletal: trace edema bilaterally, no cyanosis or clubbing  Skin: erythema and mild swelling on is scrotum  Data Reviewed: Basic Metabolic Panel:  Recent Labs Lab 09/13/13 1052 09/14/13 0350 09/15/13 0420  NA 131* 132* 132*  K 4.5 3.8 3.8  CL 92* 95* 97  CO2 25 21 24   GLUCOSE 99 85 95  BUN 15 12 11   CREATININE 1.18 0.78 0.74  CALCIUM 9.2 8.8 8.7   Liver Function Tests:  Recent Labs Lab 09/13/13 1052  AST 24  ALT 21  ALKPHOS 83  BILITOT 0.8  PROT 7.2  ALBUMIN 3.9   CBC:  Recent Labs Lab 09/13/13 1052 09/14/13 0350  WBC 13.8* 11.6*  NEUTROABS 11.5*  --   HGB 14.7 14.6  HCT 41.4 41.1  MCV 90.6 90.3  PLT 212 195   Cardiac Enzymes:  Recent Labs Lab 09/13/13 1052 09/13/13 1554 09/13/13 2239 09/14/13 0350  TROPONINI <0.30 <0.30 <0.30 <0.30   BNP (last 3 results)  Recent Labs  11/10/12 0640 09/13/13 1052  PROBNP 113.2 113.3    Recent Results (from the past 240 hour(s))  MRSA PCR SCREENING     Status: None   Collection Time    09/13/13  8:29 PM      Result Value Range Status   MRSA by PCR NEGATIVE  NEGATIVE Final   Comment:            The GeneXpert MRSA Assay (FDA     approved for NASAL specimens     only), is one component of a     comprehensive MRSA colonization     surveillance program. It is not     intended to diagnose MRSA     infection nor to guide or     monitor treatment for     MRSA infections.  URINE CULTURE     Status: None   Collection Time    09/14/13   5:55 AM      Result Value Range Status   Specimen Description URINE, RANDOM   Final   Special Requests NONE   Final   Culture  Setup Time     Final   Value: 09/14/2013 06:35     Performed at [place]     Final   Value: NO GROWTH     Performed at [facility]   Culture     Final   Value: NO GROWTH     Performed at [facility]   Report Status 09/15/2013 FINAL   Final  CLOSTRIDIUM DIFFICILE BY PCR     Status: None   Collection Time    09/14/13  3:07 PM      Result Value Range Status   C difficile by pcr NEGATIVE  NEGATIVE Final     Studies: No results found.  Scheduled Meds: . aspirin  81 mg Oral QPM  . ceFEPime (MAXIPIME) IV  2 g Intravenous Q12H  . enoxaparin (LOVENOX) injection  40 mg Subcutaneous Q24H  . ipratropium-albuterol  3 mL Nebulization BID  . labetalol  100 mg Oral BID  . lacosamide  150 mg Oral QHS  . levETIRAcetam  750 mg Oral BID  . pantoprazole  40 mg Oral Daily  . polyethylene glycol  17 g Oral QODAY  . sodium chloride  3 mL Intravenous Q12H  . sodium chloride  3 mL Intravenous Q12H  . vancomycin  1,000 mg Intravenous Q12H   Continuous Infusions: . sodium chloride 50 mL/hr at 09/14/13 1901     Time spent: >30 minutes    Barry Dean  [facility] Pager [phone]. If 7PM-7AM, please contact night-coverage at [email or web address], password [facility] 09/15/2013, 6:01 PM  LOS: 2 days

## 2013-09-15 NOTE — Progress Notes (Signed)
Pt was transferred to rm 3E18 on  a negative pressure room and put on Airborne precaution for now to rule out MAI. Instructed on about the AFB culture with smear. Will continue to monitor pt.

## 2013-09-16 LAB — BASIC METABOLIC PANEL
BUN: 9 mg/dL (ref 6–23)
CHLORIDE: 94 meq/L — AB (ref 96–112)
CO2: 26 mEq/L (ref 19–32)
Calcium: 8.9 mg/dL (ref 8.4–10.5)
Creatinine, Ser: 0.79 mg/dL (ref 0.50–1.35)
GFR calc Af Amer: >90 mL/min (ref >90)
GFR calc non Af Amer: 78 mL/min — ABNORMAL LOW (ref >90)
Glucose, Bld: 97 mg/dL (ref 70–99)
POTASSIUM: 4.1 meq/L (ref 3.7–5.3)
Sodium: 130 mEq/L — ABNORMAL LOW (ref 137–147)

## 2013-09-16 MED ORDER — SODIUM CHLORIDE 0.9 % IV BOLUS (SEPSIS)
500.0000 mL | Freq: Once | INTRAVENOUS | Status: AC
  Administered 2013-09-16: 500 mL via INTRAVENOUS

## 2013-09-16 MED ORDER — SODIUM CHLORIDE 0.9 % IV BOLUS (SEPSIS)
1000.0000 mL | Freq: Once | INTRAVENOUS | Status: AC
  Administered 2013-09-16: 1000 mL via INTRAVENOUS

## 2013-09-16 MED ORDER — LEVOFLOXACIN 750 MG PO TABS
750.0000 mg | ORAL_TABLET | Freq: Every day | ORAL | Status: DC
Start: 2013-09-16 — End: 2014-04-24

## 2013-09-16 MED ORDER — LEVOFLOXACIN 750 MG PO TABS
750.0000 mg | ORAL_TABLET | Freq: Every day | ORAL | Status: DC
  Administered 2013-09-16 – 2013-09-17 (×2): 750 mg via ORAL
  Filled 2013-09-16 (×2): qty 1

## 2013-09-16 NOTE — ED Provider Notes (Signed)
Medical screening examination/treatment/procedure(s) were performed by non-physician practitioner and as supervising physician I was immediately available for consultation/collaboration.  EKG Interpretation    Date/Time:  Sunday September 13 2013 10:08:24 EST Ventricular Rate:  67 PR Interval:  182 QRS Duration: 81 QT Interval:  375 QTC Calculation: 396 R Axis:   107 Text Interpretation:  Sinus rhythm Right axis deviation Confirmed by Jeneen Rinks  MD, [place] ([id number]) on 09/13/2013 10:13:15 AM              Tanna Furry, MD 09/16/13 1507

## 2013-09-16 NOTE — Progress Notes (Signed)
[facility] PROGRESS NOTE  Assessment/Plan: Near Syncope:  -  Episode could be related to fever and ongoing PNA infection; hypoxemia and decrease intravascular vol. CT head negative.  - d/c telemtry. - CE'Z ngative  - continue ABX's oral.  Acute toxic Encephalopathy:  - mentation improving and back baseline according to daughter.  - continue abx's  - continue supportive care  - will check home oxygen requirement prior to discharge  - Pt evaluation as he appears weak and deconditioned  - ambulate check sat.  Acute respiratory failure.  - Patient requiring oxygen supplementation. Hx of fever and cough. CT chest r/o PE but demonstrated findings suggesting atypical PNA. -HIV neg  -AFB pending  -discussed with ID; very low probability for TB or MAI. will repeat CXR; check AFB. Most likely acute PNA.  -continue Pulmicort and PRN Nebs   Mild Hyponatremia: -  due to dehydration and acute lung disease  - will change IVF's to kvo  - follow electrolytes.   History of seizure;  - continue with Keppra. Monitor for seizure activities.   Hypertension: - Continue with labetalol.  -will resume clonidine  -continue holding lotrel    Code Status: DNR  Family Communication: daughter at bedside  Disposition Plan: back to SNF at discharge    Consultants:  none  Procedures:  CXR  Antibiotics:  levaquin  HPI/Subjective: No complains  Objective: Filed Vitals:   09/15/13 1300 09/15/13 2302 09/16/13 0630 09/16/13 1036  BP: 168/69 150/60 167/77   Pulse: 68 60 67   Temp: 98 F (36.7 C) 98.7 F (37.1 C) 98.9 F (37.2 C)   TempSrc: Oral Oral Oral   Resp: 20 18    Height:      Weight:   96.752 kg (213 lb 4.8 oz)   SpO2: 93% 92% 93% 94%    Intake/Output Summary (Last 24 hours) at 09/16/13 1300 Last data filed at 09/16/13 1000  Gross per 24 hour  Intake   1060 ml  Output   2275 ml  Net  -1215 ml   Filed Weights   09/13/13 1506 09/14/13 0319 09/16/13 0630   Weight: 101.3 kg (223 lb 5.2 oz) 100.064 kg (220 lb 9.6 oz) 96.752 kg (213 lb 4.8 oz)    Exam:  General: Alert, awake, oriented x3, in no acute distress.  HEENT: No bruits, no goiter.  Heart: Regular rate and rhythm, without murmurs, rubs, gallops.  Lungs: Good air movement, CTA Abdomen: Soft, nontender, nondistended, positive bowel sounds.   Data Reviewed: Basic Metabolic Panel:  Recent Labs Lab 09/13/13 1052 09/14/13 0350 09/15/13 0420 09/16/13 0630  NA 131* 132* 132* 130*  K 4.5 3.8 3.8 4.1  CL 92* 95* 97 94*  CO2 25 21 24 26   GLUCOSE 99 85 95 97  BUN 15 12 11 9   CREATININE 1.18 0.78 0.74 0.79  CALCIUM 9.2 8.8 8.7 8.9   Liver Function Tests:  Recent Labs Lab 09/13/13 1052  AST 24  ALT 21  ALKPHOS 83  BILITOT 0.8  PROT 7.2  ALBUMIN 3.9   No results found for this basename: LIPASE, AMYLASE,  in the last 168 hours No results found for this basename: AMMONIA,  in the last 168 hours CBC:  Recent Labs Lab 09/13/13 1052 09/14/13 0350  WBC 13.8* 11.6*  NEUTROABS 11.5*  --   HGB 14.7 14.6  HCT 41.4 41.1  MCV 90.6 90.3  PLT 212 195   Cardiac Enzymes:  Recent Labs Lab 09/13/13 1052 09/13/13 1554 09/13/13  2239 09/14/13 0350  TROPONINI <0.30 <0.30 <0.30 <0.30   BNP (last 3 results)  Recent Labs  11/10/12 0640 09/13/13 1052  PROBNP 113.2 113.3   CBG:  Recent Labs Lab 09/15/13 1622  GLUCAP 128*    Recent Results (from the past 240 hour(s))  MRSA PCR SCREENING     Status: None   Collection Time    09/13/13  8:29 PM      Result Value Range Status   MRSA by PCR NEGATIVE  NEGATIVE Final   Comment:            The GeneXpert MRSA Assay (FDA     approved for NASAL specimens     only), is one component of a     comprehensive MRSA colonization     surveillance program. It is not     intended to diagnose MRSA     infection nor to guide or     monitor treatment for     MRSA infections.  URINE CULTURE     Status: None   Collection Time     09/14/13  5:55 AM      Result Value Range Status   Specimen Description URINE, RANDOM   Final   Special Requests NONE   Final   Culture  Setup Time     Final   Value: 09/14/2013 06:35     Performed at [place]     Final   Value: NO GROWTH     Performed at [facility]   Culture     Final   Value: NO GROWTH     Performed at [facility]   Report Status 09/15/2013 FINAL   Final  CLOSTRIDIUM DIFFICILE BY PCR     Status: None   Collection Time    09/14/13  3:07 PM      Result Value Range Status   C difficile by pcr NEGATIVE  NEGATIVE Final     Studies: Dg Chest 2 View  09/15/2013   CLINICAL DATA:  Shortness of breath and follow-up pneumonia.  EXAM: CHEST  2 VIEW  COMPARISON:  09/13/2013 and 12/11/2012  FINDINGS: Two views of the chest demonstrate a retrocardiac density consistent with a large hiatal hernia. Again noted are patchy densities at the lung bases which have not significantly changed. Heart size is upper limits of normal and stable. No large pleural effusions on the lateral view.  IMPRESSION: Persistent basilar lung densities. Findings could represent a combination of chronic changes and atelectasis. Subtle infection cannot be excluded at the lung bases.  Large hiatal hernia.   Electronically Signed   By: Markus Daft M.D.   On: 09/15/2013 22:27    Scheduled Meds: . aspirin  81 mg Oral QPM  . budesonide (PULMICORT) nebulizer solution  0.25 mg Nebulization BID  . cloNIDine  0.1 mg Oral BID  . enoxaparin (LOVENOX) injection  40 mg Subcutaneous Q24H  . ipratropium-albuterol  3 mL Nebulization BID  . labetalol  100 mg Oral BID  . lacosamide  150 mg Oral QHS  . levETIRAcetam  750 mg Oral BID  . levofloxacin  750 mg Oral Daily  . pantoprazole  40 mg Oral Daily  . polyethylene glycol  17 g Oral QODAY  . sodium chloride  3 mL Intravenous Q12H  . sodium chloride  3 mL Intravenous Q12H   Continuous Infusions: . sodium chloride 50 mL/hr at  09/14/13 [place],  [facility]  [facility] Pager [phone]. If 8PM-8AM, please contact night-coverage at [email or web address], password [facility] 09/16/2013, 1:00 PM  LOS: 3 days

## 2013-09-16 NOTE — Discharge Summary (Signed)
Physician Discharge Summary  ROWNAN KENAN [id number] DOB: 15-Mar-1924 DOA: 09/13/2013  PCP: [name], MD  Admit date: 09/13/2013 Discharge date: 09/17/2013  Time spent: 35 minutes  Recommendations for Outpatient Follow-up:  1. Follow up with PCP in 2 weeks.  Discharge Diagnoses:  Principal Problem:   Respiratory failure Active Problems:   HCAP (healthcare-associated pneumonia)   Seizure disorder   HTN (hypertension)   Hyponatremia   Syncope   Discharge Condition: stable  Diet recommendation: heart healthy  Filed Weights   09/16/13 0630 09/17/13 0449 09/17/13 0551  Weight: 96.752 kg (213 lb 4.8 oz) 99.247 kg (218 lb 12.8 oz) 99.4 kg (219 lb 2.2 oz)    History of present illness:  80 y.o. male with PMH significant for A fib, Seizure, HTN, who present after a presume near syncope episode. Per ED physician patient was sitting at a local restaurant eating breakfast when he suddenly become unresponsive. Patient's initial blood pressure was 90/60 by EMS. "Patient, states he did not know that he passed out although we still like he was still able to see and understanding people were saying". Patient relates dizziness prior to the episode. Didn't notice palpitation at that time. He relates chest pain, on and off since last night. Denies chest pain currently. He was notice to be hypoxic 86 on Room air. His oxygen saturation is at 99 at 3 L. He has notice some cough.    Hospital Course:  Near Syncope:  - Episode could be related to fever and ongoing PNA infection; hypoxemia and decrease intravascular vol. CT head negative.  - CE'Z ngative  - continue ABX's oral.   Acute toxic Encephalopathy:  - Confused form admission - Started empiric abx's cont Levaquin for 2 additional days. - checked O2 requirements. - Pt evaluation as he appears weak and deconditioned  - resolved with treatment of PNA. - mentation improving and back baseline according to daughter.   Acute  respiratory failure due to PNA:  - Patient requiring oxygen supplementation. Hx of fever and cough. CT chest r/o PE but demonstrated findings suggesting atypical PNA.  - HIV neg  - AFB pending  - discussed with ID; very low probability for TB or MAI. will repeat CXR; check AFB. Most likely acute PNA.  - continue Pulmicort and PRN Nebs.  Mild Hyponatremia:  - due to dehydration and acute lung disease   History of seizure;  - continue with Keppra. Monitor for seizure activities.   Hypertension:  -resume home meds.  Procedures:  CXR  CT chest  Consultations:  none  Discharge Exam: Filed Vitals:   09/17/13 1002  BP: 158/76  Pulse: 78  Temp: 98.1 F (36.7 C)  Resp:     General: A&O x3 Cardiovascular: RRR Respiratory: good air movement CTA B/L  Discharge Instructions      Discharge Orders   Future Appointments Provider Department Dept Phone   05/06/2014 2:15 PM Candee Furbish, MD [facility] [phone]   07/13/2014 10:30 AM Dennie Bible, NP [facility] [phone]   Future Orders Complete By Expires   Diet - low sodium heart healthy  As directed    Increase activity slowly  As directed        Medication List         acetaminophen 500 MG tablet  Commonly known as:  TYLENOL  Take 500 mg by mouth every 6 (six) hours as needed for pain. Not to exceed 3 gms of acetaminophen in 24 hours  amLODipine-benazepril 10-40 MG per capsule  Commonly known as:  LOTREL  Take 1 capsule by mouth daily.     aspirin 81 MG chewable tablet  Chew 81 mg by mouth every evening.     cloNIDine 0.1 MG tablet  Commonly known as:  CATAPRES  Take 0.1 mg by mouth 2 (two) times daily.     ipratropium-albuterol 0.5-2.5 (3) MG/3ML Soln  Commonly known as:  DUONEB  Take 3 mLs by nebulization 2 (two) times daily.     iron polysaccharides 150 MG capsule  Commonly known as:  NIFEREX  Take 1 capsule (150 mg total) by mouth 2 (two) times  daily.     labetalol 100 MG tablet  Commonly known as:  NORMODYNE  Take 100 mg by mouth 2 (two) times daily.     Lacosamide 150 MG Tabs  Commonly known as:  VIMPAT  Take 1 tablet (150 mg total) by mouth at bedtime.     levETIRAcetam 750 MG tablet  Commonly known as:  KEPPRA  Take 750 mg by mouth 2 (two) times daily.     levofloxacin 750 MG tablet  Commonly known as:  LEVAQUIN  Take 1 tablet (750 mg total) by mouth daily.     LUBRIDERM ADVANCED THERAPY Lotn  Apply 1 application topically daily. Apply to feet after bathing every day     omeprazole 20 MG capsule  Commonly known as:  PRILOSEC  Take 20 mg by mouth daily.     polyethylene glycol packet  Commonly known as:  MIRALAX / GLYCOLAX  Take 17 g by mouth every other day.     promethazine 12.5 MG tablet  Commonly known as:  PHENERGAN  Take 12.5 mg by mouth every 6 (six) hours as needed for nausea or vomiting.     sennosides-docusate sodium 8.6-50 MG tablet  Commonly known as:  SENOKOT-S  Take 1 tablet by mouth at bedtime as needed for constipation.       Allergies  Allergen Reactions  . Topamax Other (See Comments)    Unknown reaction  . Uroxatral [Alfuzosin Hydrochloride] Other (See Comments)    Unknown reaction  . Valium Other (See Comments)    Unknown reaction      The results of significant diagnostics from this hospitalization (including imaging, microbiology, ancillary and laboratory) are listed below for reference.    Significant Diagnostic Studies: Dg Chest 2 View  09/15/2013   CLINICAL DATA:  Shortness of breath and follow-up pneumonia.  EXAM: CHEST  2 VIEW  COMPARISON:  09/13/2013 and 12/11/2012  FINDINGS: Two views of the chest demonstrate a retrocardiac density consistent with a large hiatal hernia. Again noted are patchy densities at the lung bases which have not significantly changed. Heart size is upper limits of normal and stable. No large pleural effusions on the lateral view.  IMPRESSION:  Persistent basilar lung densities. Findings could represent a combination of chronic changes and atelectasis. Subtle infection cannot be excluded at the lung bases.  Large hiatal hernia.   Electronically Signed   By: Markus Daft M.D.   On: 09/15/2013 22:27   Ct Head Wo Contrast  09/13/2013   CLINICAL DATA:  Near-syncope and generalized weakness.  EXAM: CT HEAD WITHOUT CONTRAST  TECHNIQUE: Contiguous axial images were obtained from the base of the skull through the vertex without intravenous contrast.  COMPARISON:  05/24/2011  FINDINGS: There is no evidence of acute infarct, mass, midline shift, intracranial hemorrhage, or extra-axial fluid collection. Moderate cerebral atrophy is unchanged.  Periventricular white-matter hypodensities do not appear significantly changed and are compatible with moderate chronic small vessel ischemic disease. Prior bilateral cataract surgery is noted. Visualized mastoid air cells are clear. There is minimal left maxillary sinus mucosal thickening, incompletely imaged.  IMPRESSION: No evidence of acute intracranial abnormality.   Electronically Signed   By: Logan Bores   On: 09/13/2013 12:07   Ct Angio Chest Pe W/cm &/or Wo Cm  09/13/2013   CLINICAL DATA:  chest pain, hypoxemia  EXAM: CT ANGIOGRAPHY CHEST WITH CONTRAST  TECHNIQUE: Multidetector CT imaging of the chest was performed using the standard protocol during bolus administration of intravenous contrast. Multiplanar CT image reconstructions including MIPs were obtained to evaluate the vascular anatomy.  CONTRAST:  55mL OMNIPAQUE IOHEXOL 350 MG/ML SOLN  COMPARISON:  DG CHEST 1V PORT dated 09/13/2013; DG CHEST 2V dated 12/11/2012 arm with minimal  FINDINGS: There are no filling defects within the pulmonary arteries to suggest acute pulmonary embolism. No acute findings of the aorta great vessels. No pericardial fluid. Coronary calcifications are present.  There is a large hiatal hernia present. No mediastinal lymphadenopathy.   No axillary supraclavicular adenopathy.  Review of the lung parenchyma demonstrates ground-glass opacity in the superior segment of the left lower lobe extending to the base the of the left lower lobe with some volume loss. Right lung demonstrates a fine branching nodular pattern in the right lower lobe (image 57). .  Limited view of the upper abdomen is unremarkable.  Review of the skeleton demonstrates no acute findings. There is scoliosis of the spine.  Review of the MIP images confirms the above findings.  IMPRESSION: 1. No evidence acute pulmonary embolism. 2. Left lower lobe ground-glass opacity may in part relate to atelectasis from the large hiatal hernia. Cannot exclude superimposed infection or asymmetric edema or aspiration pneumonitis. 3. Found branching pattern in right lower lobe suggest acute or chronic infection. Consider MAI infection.   Electronically Signed   By: Suzy Bouchard M.D.   On: 09/13/2013 17:26   Dg Chest Port 1 View  09/13/2013   CLINICAL DATA:  Syncope and chest pain  EXAM: PORTABLE CHEST - 1 VIEW  COMPARISON:  12/11/2012 and 11/11/2012  FINDINGS: Mild cardiac enlargement. Aortic arch calcification. Vascular pattern normal. Interstitial change at the right base. More prominent interstitial change at the left base. When compared to the prior study, these opacities appear unchanged.  IMPRESSION: No acute findings.  Chronic bibasilar scarring.   Electronically Signed   By: Skipper Cliche M.D.   On: 09/13/2013 10:57    Microbiology: Recent Results (from the past 240 hour(s))  MRSA PCR SCREENING     Status: None   Collection Time    09/13/13  8:29 PM      Result Value Range Status   MRSA by PCR NEGATIVE  NEGATIVE Final   Comment:            The GeneXpert MRSA Assay (FDA     approved for NASAL specimens     only), is one component of a     comprehensive MRSA colonization     surveillance program. It is not     intended to diagnose MRSA     infection nor to guide or      monitor treatment for     MRSA infections.  URINE CULTURE     Status: None   Collection Time    09/14/13  5:55 AM      Result Value Range Status  Specimen Description URINE, RANDOM   Final   Special Requests NONE   Final   Culture  Setup Time     Final   Value: 09/14/2013 06:35     Performed at [place]     Final   Value: NO GROWTH     Performed at [facility]   Culture     Final   Value: NO GROWTH     Performed at [facility]   Report Status 09/15/2013 FINAL   Final  CLOSTRIDIUM DIFFICILE BY PCR     Status: None   Collection Time    09/14/13  3:07 PM      Result Value Range Status   C difficile by pcr NEGATIVE  NEGATIVE Final     Labs: Basic Metabolic Panel:  Recent Labs Lab 09/13/13 1052 09/14/13 0350 09/15/13 0420 09/16/13 0630 09/17/13 0313  NA 131* 132* 132* 130* 132*  K 4.5 3.8 3.8 4.1 4.2  CL 92* 95* 97 94* 95*  CO2 25 21 24 26 25   GLUCOSE 99 85 95 97 92  BUN 15 12 11 9 12   CREATININE 1.18 0.78 0.74 0.79 0.70  CALCIUM 9.2 8.8 8.7 8.9 8.9   Liver Function Tests:  Recent Labs Lab 09/13/13 1052  AST 24  ALT 21  ALKPHOS 83  BILITOT 0.8  PROT 7.2  ALBUMIN 3.9   No results found for this basename: LIPASE, AMYLASE,  in the last 168 hours No results found for this basename: AMMONIA,  in the last 168 hours CBC:  Recent Labs Lab 09/13/13 1052 09/14/13 0350  WBC 13.8* 11.6*  NEUTROABS 11.5*  --   HGB 14.7 14.6  HCT 41.4 41.1  MCV 90.6 90.3  PLT 212 195   Cardiac Enzymes:  Recent Labs Lab 09/13/13 1052 09/13/13 1554 09/13/13 2239 09/14/13 0350  TROPONINI <0.30 <0.30 <0.30 <0.30   BNP: BNP (last 3 results)  Recent Labs  11/10/12 0640 09/13/13 1052  PROBNP 113.2 113.3   CBG:  Recent Labs Lab 09/15/13 1622  GLUCAP 128*       Signed:  FELIZ ORTIZ, Avarae Zwart  [facility] 09/17/2013, 10:33 AM

## 2013-09-16 NOTE — Evaluation (Signed)
Physical Therapy Evaluation Patient Details Name: Barry Dean MRN: [id number] DOB: 12/22/1923 Today's Date: 09/16/2013 Time: 7412-8786 PT Time Calculation (min): 33 min  PT Assessment / Plan / Recommendation History of Present Illness  pt presents with PNA and r/o TB.    Clinical Impression  Pt generally weak and deconditioned.  Pt will need SNF level of care at D/C.      PT Assessment  Patient needs continued PT services    Follow Up Recommendations  SNF    Does the patient have the potential to tolerate intense rehabilitation      Barriers to Discharge        Equipment Recommendations  Rolling walker with 5" wheels    Recommendations for Other Services     Frequency Min 2X/week    Precautions / Restrictions Precautions Precautions: Fall Restrictions Weight Bearing Restrictions: No   Pertinent Vitals/Pain Denied pain.        Mobility  Transfers Overall transfer level: Needs assistance Equipment used:  (Armrests) Transfers: Sit to/from [facility] Sit to Stand: Min assist Stand pivot transfers: Min assist General transfer comment: pt utilizes armrests for supoort during SPT.  Slight posterior lean.      Exercises     PT Diagnosis: Difficulty walking;Generalized weakness  PT Problem List: Decreased strength;Decreased activity tolerance;Decreased balance;Decreased mobility;Decreased coordination;Decreased cognition;Decreased knowledge of use of DME;Decreased safety awareness;Cardiopulmonary status limiting activity PT Treatment Interventions: DME instruction;Gait training;Functional mobility training;Therapeutic activities;Therapeutic exercise;Balance training;Neuromuscular re-education;Patient/family education;Cognitive remediation     PT Goals(Current goals can be found in the care plan section) Acute Rehab PT Goals Patient Stated Goal: None stated.   PT Goal Formulation: With patient Time For Goal Achievement: 09/30/13 Potential to  Achieve Goals: Fair  Visit Information  Last PT Received On: 09/16/13 Assistance Needed: +1 History of Present Illness: pt presents with PNA and r/o TB.         Prior [place] expects to be discharged to:: Skilled nursing facility Additional Comments: pt states he lives with his daughter, but will need SNF level of care at D/C.   Prior Function Level of Independence: Needs assistance Gait / Transfers Assistance Needed: Uses [phone].   ADL's / Homemaking Assistance Needed: Daughter performs all homemaking tasks and A pt with ADLs.   Communication Communication: HOH    Cognition  Cognition Arousal/Alertness: Awake/alert Behavior During Therapy: WFL for tasks assessed/performed Overall Cognitive Status: No family/caregiver present to determine baseline cognitive functioning Difficult to assess due to: Hard of hearing/deaf    Extremity/Trunk Assessment Upper Extremity Assessment Upper Extremity Assessment: Generalized weakness Lower Extremity Assessment Lower Extremity Assessment: Generalized weakness Cervical / Trunk Assessment Cervical / Trunk Assessment: Kyphotic   Balance Balance Overall balance assessment: Needs assistance Standing balance support: Single extremity supported;During functional activity Standing balance-Leahy Scale: Fair  End of Session PT - End of Session Equipment Utilized During Treatment: Oxygen Activity Tolerance: Patient limited by fatigue Patient left: with call bell/phone within reach (Sitting on 3-in-1) Nurse Communication: Mobility status (Sitting on 3-in-1 for BM)  GP     [place], Thornton Papas, [place] 09/16/2013, 2:50 PM

## 2013-09-17 LAB — BASIC METABOLIC PANEL
BUN: 12 mg/dL (ref 6–23)
CHLORIDE: 95 meq/L — AB (ref 96–112)
CO2: 25 mEq/L (ref 19–32)
Calcium: 8.9 mg/dL (ref 8.4–10.5)
Creatinine, Ser: 0.7 mg/dL (ref 0.50–1.35)
GFR, EST NON AFRICAN AMERICAN: 82 mL/min — AB (ref >90)
Glucose, Bld: 92 mg/dL (ref 70–99)
POTASSIUM: 4.2 meq/L (ref 3.7–5.3)
SODIUM: 132 meq/L — AB (ref 137–147)

## 2013-09-17 NOTE — Progress Notes (Signed)
Patient cardiac monitoring discontinued per MD order.  Telemetry box removed and CCMD made aware. RN will continue to monitor. Shellee Milo, RN

## 2013-09-17 NOTE — Progress Notes (Signed)
Discharge instructions and discharge medication discussed with patients daughter, Akshay Spang. Discussed medications, diet and follow up appointment. Daughter, who is patients POA, verbally understands instructions.

## 2013-09-17 NOTE — Progress Notes (Signed)
UR completed Audie Wieser K. Delmer Kowalski, RN, BSN, MSHL, CCM  09/17/2013 1:51 PM

## 2013-09-17 NOTE — Care Management Note (Addendum)
  Page 2 of 2   09/17/2013     3:42:38 PM   CARE MANAGEMENT NOTE 09/17/2013  Patient:  Barry Dean,Barry Dean   Account Number:  [id number]  Date Initiated:  09/17/2013  Documentation initiated by:  Alyria Krack  Subjective/Objective Assessment:   Admitted with [place]     Action/Plan:   CM Consult   Anticipated DC Date:  09/17/2013   Anticipated DC Plan:  ASSISTED LIVING / [place]  CM consult      Choice offered to / List presented to:  NA   DME arranged  [place]      DME agency  [place] arranged  HH-1 RN  [place]      [place] agency  [facility]   Status of service:  Completed, signed off Medicare Important Message given?   (If response is "NO", the following Medicare IM given date fields will be blank) Date Medicare IM given:   Date Additional Medicare IM given:    Discharge Disposition:  ASSISTED LIVING  Per UR Regulation:  Reviewed for med. necessity/level of care/duration of stay  If discussed at [place] of Stay Meetings, dates discussed:    Comments:  09/17/2013 Update from [facility].  States deliveed walker to room but patient/PCG refused stating already has Dean walker in the home. [address] RN, BSN, [place], [place] [phone] Unit) [phone] 09/17/2013   09/17/2013 Last UR 09/14/2013 Social:  From [facility] and elects to return to ALF rather than SNF. PT Recs: SNF DME Recs:  Rolling walker with 5" wheels  ([facility] notified) HHS:  [place]:  RN, PT, [facility] [place] [facility] / [facility] notified) Dispostion:  ALF/HHS ADD:  today Mariann Laster RN, BSN, [place], [place] [phone] Unit) [phone] 09/17/2013

## 2013-09-17 NOTE — Progress Notes (Addendum)
Patient taken out of hospital for discharge via wheelchair. Daughter to take patient to [facility]. Packet for assisted living given to Manuela Schwartz, daughter who was instructed to give packet to facility. Prescription for Levaquin and discharge instructions included in packet.

## 2013-09-19 DIAGNOSIS — F3289 Other specified depressive episodes: Secondary | ICD-10-CM | POA: Diagnosis not present

## 2013-09-19 DIAGNOSIS — J189 Pneumonia, unspecified organism: Secondary | ICD-10-CM | POA: Diagnosis not present

## 2013-09-19 DIAGNOSIS — F329 Major depressive disorder, single episode, unspecified: Secondary | ICD-10-CM | POA: Diagnosis not present

## 2013-09-19 DIAGNOSIS — G250 Essential tremor: Secondary | ICD-10-CM | POA: Diagnosis not present

## 2013-09-19 DIAGNOSIS — G40802 Other epilepsy, not intractable, without status epilepticus: Secondary | ICD-10-CM | POA: Diagnosis not present

## 2013-09-19 DIAGNOSIS — I4891 Unspecified atrial fibrillation: Secondary | ICD-10-CM | POA: Diagnosis not present

## 2013-09-19 DIAGNOSIS — I10 Essential (primary) hypertension: Secondary | ICD-10-CM | POA: Diagnosis not present

## 2013-09-22 DIAGNOSIS — G40802 Other epilepsy, not intractable, without status epilepticus: Secondary | ICD-10-CM | POA: Diagnosis not present

## 2013-09-22 DIAGNOSIS — F329 Major depressive disorder, single episode, unspecified: Secondary | ICD-10-CM | POA: Diagnosis not present

## 2013-09-22 DIAGNOSIS — I10 Essential (primary) hypertension: Secondary | ICD-10-CM | POA: Diagnosis not present

## 2013-09-22 DIAGNOSIS — G250 Essential tremor: Secondary | ICD-10-CM | POA: Diagnosis not present

## 2013-09-22 DIAGNOSIS — J189 Pneumonia, unspecified organism: Secondary | ICD-10-CM | POA: Diagnosis not present

## 2013-09-22 DIAGNOSIS — F3289 Other specified depressive episodes: Secondary | ICD-10-CM | POA: Diagnosis not present

## 2013-09-22 DIAGNOSIS — I4891 Unspecified atrial fibrillation: Secondary | ICD-10-CM | POA: Diagnosis not present

## 2013-09-25 DIAGNOSIS — I10 Essential (primary) hypertension: Secondary | ICD-10-CM | POA: Diagnosis not present

## 2013-09-25 DIAGNOSIS — I4891 Unspecified atrial fibrillation: Secondary | ICD-10-CM | POA: Diagnosis not present

## 2013-09-25 DIAGNOSIS — G40802 Other epilepsy, not intractable, without status epilepticus: Secondary | ICD-10-CM | POA: Diagnosis not present

## 2013-09-25 DIAGNOSIS — F329 Major depressive disorder, single episode, unspecified: Secondary | ICD-10-CM | POA: Diagnosis not present

## 2013-09-25 DIAGNOSIS — F3289 Other specified depressive episodes: Secondary | ICD-10-CM | POA: Diagnosis not present

## 2013-09-25 DIAGNOSIS — G250 Essential tremor: Secondary | ICD-10-CM | POA: Diagnosis not present

## 2013-09-25 DIAGNOSIS — J189 Pneumonia, unspecified organism: Secondary | ICD-10-CM | POA: Diagnosis not present

## 2013-09-29 DIAGNOSIS — G40802 Other epilepsy, not intractable, without status epilepticus: Secondary | ICD-10-CM | POA: Diagnosis not present

## 2013-09-29 DIAGNOSIS — J189 Pneumonia, unspecified organism: Secondary | ICD-10-CM | POA: Diagnosis not present

## 2013-09-29 DIAGNOSIS — G252 Other specified forms of tremor: Secondary | ICD-10-CM | POA: Diagnosis not present

## 2013-09-29 DIAGNOSIS — I4891 Unspecified atrial fibrillation: Secondary | ICD-10-CM | POA: Diagnosis not present

## 2013-09-29 DIAGNOSIS — F3289 Other specified depressive episodes: Secondary | ICD-10-CM | POA: Diagnosis not present

## 2013-09-29 DIAGNOSIS — G250 Essential tremor: Secondary | ICD-10-CM | POA: Diagnosis not present

## 2013-09-29 DIAGNOSIS — F329 Major depressive disorder, single episode, unspecified: Secondary | ICD-10-CM | POA: Diagnosis not present

## 2013-09-29 DIAGNOSIS — I10 Essential (primary) hypertension: Secondary | ICD-10-CM | POA: Diagnosis not present

## 2013-10-02 ENCOUNTER — Ambulatory Visit
Admission: RE | Admit: 2013-10-02 | Discharge: 2013-10-02 | Disposition: A | Discharge status: Home / Self Care | Payer: Medicare Other | Source: Ambulatory Visit | Attending: Geriatric Medicine | Admitting: Geriatric Medicine

## 2013-10-02 ENCOUNTER — Other Ambulatory Visit: Payer: Self-pay | Admitting: Geriatric Medicine

## 2013-10-02 DIAGNOSIS — J449 Chronic obstructive pulmonary disease, unspecified: Secondary | ICD-10-CM | POA: Diagnosis not present

## 2013-10-02 DIAGNOSIS — I10 Essential (primary) hypertension: Secondary | ICD-10-CM | POA: Diagnosis not present

## 2013-10-02 DIAGNOSIS — J189 Pneumonia, unspecified organism: Secondary | ICD-10-CM

## 2013-10-02 DIAGNOSIS — Z79899 Other long term (current) drug therapy: Secondary | ICD-10-CM | POA: Diagnosis not present

## 2013-10-02 DIAGNOSIS — E871 Hypo-osmolality and hyponatremia: Secondary | ICD-10-CM | POA: Diagnosis not present

## 2013-10-02 DIAGNOSIS — H9110 Presbycusis, unspecified ear: Secondary | ICD-10-CM | POA: Diagnosis not present

## 2013-10-09 DIAGNOSIS — I4891 Unspecified atrial fibrillation: Secondary | ICD-10-CM | POA: Diagnosis not present

## 2013-10-09 DIAGNOSIS — F3289 Other specified depressive episodes: Secondary | ICD-10-CM | POA: Diagnosis not present

## 2013-10-09 DIAGNOSIS — F329 Major depressive disorder, single episode, unspecified: Secondary | ICD-10-CM | POA: Diagnosis not present

## 2013-10-09 DIAGNOSIS — G250 Essential tremor: Secondary | ICD-10-CM | POA: Diagnosis not present

## 2013-10-09 DIAGNOSIS — I10 Essential (primary) hypertension: Secondary | ICD-10-CM | POA: Diagnosis not present

## 2013-10-09 DIAGNOSIS — G40802 Other epilepsy, not intractable, without status epilepticus: Secondary | ICD-10-CM | POA: Diagnosis not present

## 2013-10-09 DIAGNOSIS — J189 Pneumonia, unspecified organism: Secondary | ICD-10-CM | POA: Diagnosis not present

## 2014-02-02 DIAGNOSIS — E669 Obesity, unspecified: Secondary | ICD-10-CM | POA: Diagnosis not present

## 2014-02-02 DIAGNOSIS — Z6833 Body mass index (BMI) 33.0-33.9, adult: Secondary | ICD-10-CM | POA: Diagnosis not present

## 2014-02-02 DIAGNOSIS — I10 Essential (primary) hypertension: Secondary | ICD-10-CM | POA: Diagnosis not present

## 2014-02-02 DIAGNOSIS — H9190 Unspecified hearing loss, unspecified ear: Secondary | ICD-10-CM | POA: Diagnosis not present

## 2014-04-12 ENCOUNTER — Encounter: Payer: Self-pay | Admitting: Podiatry

## 2014-04-12 ENCOUNTER — Ambulatory Visit (INDEPENDENT_AMBULATORY_CARE_PROVIDER_SITE_OTHER): Payer: Medicare Other | Admitting: Podiatry

## 2014-04-12 VITALS — BP 141/73 | HR 63 | Resp 17 | Ht 76.0 in | Wt 230.0 lb

## 2014-04-12 DIAGNOSIS — M79609 Pain in unspecified limb: Secondary | ICD-10-CM

## 2014-04-12 DIAGNOSIS — B351 Tinea unguium: Secondary | ICD-10-CM | POA: Diagnosis not present

## 2014-04-12 DIAGNOSIS — M79676 Pain in unspecified toe(s): Secondary | ICD-10-CM

## 2014-04-12 NOTE — Progress Notes (Signed)
   Subjective:    Patient ID: Barry Dean, male    DOB: 13-Dec-1923, 80 y.o.   MRN: [id number]  HPI Comments: N debridement L 10 toenails D and O long-term C elongated and thickened tender toenails A poor circulation and difficulty arranging podiatric care T hx of pedicure January 2015   This patient is living in assisted living facility in the last visit for debridement of nails and office was 11/28/2011   Review of Systems  HENT: Positive for congestion, hearing loss, sinus pressure and sneezing.   Cardiovascular: Positive for leg swelling.  Hematological: Bruises/bleeds easily.  All other systems reviewed and are negative.      Objective:   Physical Exam  Orientated x3 white male presents with his wife  Dermatological: The toenails are extremely hypertrophic, brittle, elongated, discolored and tender to palpation      Assessment & Plan:   Assessment: Symptomatic neglected onychomycoses 6-10  Plan: Nails x10 are debrided without any bleeding  Patient will have followup podiatric care at assisted living facility

## 2014-04-20 DIAGNOSIS — L608 Other nail disorders: Secondary | ICD-10-CM | POA: Diagnosis not present

## 2014-04-24 ENCOUNTER — Encounter (HOSPITAL_COMMUNITY): Payer: Self-pay | Admitting: Emergency Medicine

## 2014-04-24 ENCOUNTER — Inpatient Hospital Stay (HOSPITAL_COMMUNITY)
Admission: EM | Admit: 2014-04-24 | Discharge: 2014-04-27 | DRG: 194 | Disposition: A | Discharge status: Skilled Nursing Facility | Payer: Medicare Other | Attending: Internal Medicine | Admitting: Internal Medicine

## 2014-04-24 ENCOUNTER — Emergency Department (HOSPITAL_COMMUNITY): Payer: Medicare Other

## 2014-04-24 DIAGNOSIS — R1110 Vomiting, unspecified: Secondary | ICD-10-CM

## 2014-04-24 DIAGNOSIS — J189 Pneumonia, unspecified organism: Principal | ICD-10-CM | POA: Diagnosis present

## 2014-04-24 DIAGNOSIS — Z7982 Long term (current) use of aspirin: Secondary | ICD-10-CM

## 2014-04-24 DIAGNOSIS — K5289 Other specified noninfective gastroenteritis and colitis: Secondary | ICD-10-CM | POA: Diagnosis not present

## 2014-04-24 DIAGNOSIS — I059 Rheumatic mitral valve disease, unspecified: Secondary | ICD-10-CM | POA: Diagnosis present

## 2014-04-24 DIAGNOSIS — R0902 Hypoxemia: Secondary | ICD-10-CM | POA: Diagnosis present

## 2014-04-24 DIAGNOSIS — E871 Hypo-osmolality and hyponatremia: Secondary | ICD-10-CM | POA: Diagnosis present

## 2014-04-24 DIAGNOSIS — N400 Enlarged prostate without lower urinary tract symptoms: Secondary | ICD-10-CM | POA: Diagnosis present

## 2014-04-24 DIAGNOSIS — R55 Syncope and collapse: Secondary | ICD-10-CM | POA: Diagnosis not present

## 2014-04-24 DIAGNOSIS — M129 Arthropathy, unspecified: Secondary | ICD-10-CM | POA: Diagnosis present

## 2014-04-24 DIAGNOSIS — R404 Transient alteration of awareness: Secondary | ICD-10-CM | POA: Diagnosis not present

## 2014-04-24 DIAGNOSIS — R112 Nausea with vomiting, unspecified: Secondary | ICD-10-CM | POA: Diagnosis not present

## 2014-04-24 DIAGNOSIS — I10 Essential (primary) hypertension: Secondary | ICD-10-CM | POA: Diagnosis present

## 2014-04-24 DIAGNOSIS — Z66 Do not resuscitate: Secondary | ICD-10-CM | POA: Diagnosis present

## 2014-04-24 DIAGNOSIS — G40909 Epilepsy, unspecified, not intractable, without status epilepticus: Secondary | ICD-10-CM | POA: Diagnosis present

## 2014-04-24 DIAGNOSIS — I4891 Unspecified atrial fibrillation: Secondary | ICD-10-CM | POA: Diagnosis present

## 2014-04-24 DIAGNOSIS — IMO0002 Reserved for concepts with insufficient information to code with codable children: Secondary | ICD-10-CM | POA: Diagnosis not present

## 2014-04-24 DIAGNOSIS — R109 Unspecified abdominal pain: Secondary | ICD-10-CM | POA: Diagnosis not present

## 2014-04-24 DIAGNOSIS — K219 Gastro-esophageal reflux disease without esophagitis: Secondary | ICD-10-CM | POA: Diagnosis present

## 2014-04-24 DIAGNOSIS — K529 Noninfective gastroenteritis and colitis, unspecified: Secondary | ICD-10-CM

## 2014-04-24 DIAGNOSIS — K449 Diaphragmatic hernia without obstruction or gangrene: Secondary | ICD-10-CM | POA: Diagnosis present

## 2014-04-24 DIAGNOSIS — N390 Urinary tract infection, site not specified: Secondary | ICD-10-CM | POA: Diagnosis present

## 2014-04-24 DIAGNOSIS — M25559 Pain in unspecified hip: Secondary | ICD-10-CM | POA: Diagnosis not present

## 2014-04-24 DIAGNOSIS — Z4682 Encounter for fitting and adjustment of non-vascular catheter: Secondary | ICD-10-CM | POA: Diagnosis not present

## 2014-04-24 DIAGNOSIS — H9190 Unspecified hearing loss, unspecified ear: Secondary | ICD-10-CM | POA: Diagnosis present

## 2014-04-24 LAB — CBC
HEMATOCRIT: 41 % (ref 39.0–52.0)
HEMOGLOBIN: 14.3 g/dL (ref 13.0–17.0)
MCH: 31 pg (ref 26.0–34.0)
MCHC: 34.9 g/dL (ref 30.0–36.0)
MCV: 88.9 fL (ref 78.0–100.0)
Platelets: 243 10*3/uL (ref 150–400)
RBC: 4.61 MIL/uL (ref 4.22–5.81)
RDW: 13.6 % (ref 11.5–15.5)
WBC: 5 10*3/uL (ref 4.0–10.5)

## 2014-04-24 LAB — I-STAT TROPONIN, ED: Troponin i, poc: 0 ng/mL (ref 0.00–0.08)

## 2014-04-24 LAB — COMPREHENSIVE METABOLIC PANEL
ALT: 19 U/L (ref 0–53)
AST: 23 U/L (ref 0–37)
Albumin: 4 g/dL (ref 3.5–5.2)
Alkaline Phosphatase: 72 U/L (ref 39–117)
Anion gap: 11 (ref 5–15)
BUN: 14 mg/dL (ref 6–23)
CO2: 24 mEq/L (ref 19–32)
Calcium: 9.3 mg/dL (ref 8.4–10.5)
Chloride: 93 mEq/L — ABNORMAL LOW (ref 96–112)
Creatinine, Ser: 0.93 mg/dL (ref 0.50–1.35)
GFR calc Af Amer: 84 mL/min — ABNORMAL LOW (ref >90)
GFR, EST NON AFRICAN AMERICAN: 73 mL/min — AB (ref >90)
GLUCOSE: 102 mg/dL — AB (ref 70–99)
POTASSIUM: 4.6 meq/L (ref 3.7–5.3)
SODIUM: 128 meq/L — AB (ref 137–147)
TOTAL PROTEIN: 7.1 g/dL (ref 6.0–8.3)
Total Bilirubin: 0.5 mg/dL (ref 0.3–1.2)

## 2014-04-24 LAB — URINALYSIS, ROUTINE W REFLEX MICROSCOPIC
Bilirubin Urine: NEGATIVE
Glucose, UA: NEGATIVE mg/dL
HGB URINE DIPSTICK: NEGATIVE
Ketones, ur: NEGATIVE mg/dL
Nitrite: NEGATIVE
Protein, ur: NEGATIVE mg/dL
SPECIFIC GRAVITY, URINE: 1.023 (ref 1.005–1.030)
Urobilinogen, UA: 1 mg/dL (ref 0.0–1.0)
pH: 6 (ref 5.0–8.0)

## 2014-04-24 LAB — URINE MICROSCOPIC-ADD ON

## 2014-04-24 MED ORDER — PIPERACILLIN-TAZOBACTAM 3.375 G IVPB
3.3750 g | Freq: Three times a day (TID) | INTRAVENOUS | Status: DC
  Administered 2014-04-25 – 2014-04-26 (×5): 3.375 g via INTRAVENOUS
  Filled 2014-04-24 (×7): qty 50

## 2014-04-24 MED ORDER — DEXTROSE 5 % IV SOLN: 1.0000 g | INTRAVENOUS | Status: DC

## 2014-04-24 MED ORDER — LABETALOL HCL 100 MG PO TABS
100.0000 mg | ORAL_TABLET | Freq: Two times a day (BID) | ORAL | Status: DC
  Administered 2014-04-25 – 2014-04-27 (×5): 100 mg via ORAL
  Filled 2014-04-24 (×7): qty 1

## 2014-04-24 MED ORDER — POLYSACCHARIDE IRON COMPLEX 150 MG PO CAPS
150.0000 mg | ORAL_CAPSULE | Freq: Two times a day (BID) | ORAL | Status: DC
  Administered 2014-04-25 – 2014-04-27 (×5): 150 mg via ORAL
  Filled 2014-04-24 (×7): qty 1

## 2014-04-24 MED ORDER — IPRATROPIUM-ALBUTEROL 0.5-2.5 (3) MG/3ML IN SOLN
3.0000 mL | Freq: Two times a day (BID) | RESPIRATORY_TRACT | Status: DC
  Administered 2014-04-25 – 2014-04-27 (×5): 3 mL via RESPIRATORY_TRACT
  Filled 2014-04-24 (×5): qty 3

## 2014-04-24 MED ORDER — SODIUM CHLORIDE 0.9 % IV BOLUS (SEPSIS)
500.0000 mL | Freq: Once | INTRAVENOUS | Status: AC
  Administered 2014-04-24: 500 mL via INTRAVENOUS

## 2014-04-24 MED ORDER — VANCOMYCIN HCL 10 G IV SOLR
2000.0000 mg | Freq: Once | INTRAVENOUS | Status: AC
  Administered 2014-04-24: 2000 mg via INTRAVENOUS
  Filled 2014-04-24: qty 2000

## 2014-04-24 MED ORDER — PANTOPRAZOLE SODIUM 40 MG PO TBEC
40.0000 mg | DELAYED_RELEASE_TABLET | Freq: Every day | ORAL | Status: DC
  Administered 2014-04-25 – 2014-04-27 (×3): 40 mg via ORAL
  Filled 2014-04-24 (×3): qty 1

## 2014-04-24 MED ORDER — ENOXAPARIN SODIUM 40 MG/0.4ML ~~LOC~~ SOLN
40.0000 mg | SUBCUTANEOUS | Status: DC
  Administered 2014-04-25 – 2014-04-27 (×3): 40 mg via SUBCUTANEOUS
  Filled 2014-04-24 (×3): qty 0.4

## 2014-04-24 MED ORDER — CLONIDINE HCL 0.1 MG PO TABS
0.1000 mg | ORAL_TABLET | Freq: Two times a day (BID) | ORAL | Status: DC
  Administered 2014-04-25 – 2014-04-27 (×4): 0.1 mg via ORAL
  Filled 2014-04-24 (×6): qty 1

## 2014-04-24 MED ORDER — LEVETIRACETAM 750 MG PO TABS
750.0000 mg | ORAL_TABLET | Freq: Two times a day (BID) | ORAL | Status: DC
  Administered 2014-04-25 – 2014-04-27 (×5): 750 mg via ORAL
  Filled 2014-04-24 (×7): qty 1

## 2014-04-24 MED ORDER — SENNA-DOCUSATE SODIUM 8.6-50 MG PO TABS
1.0000 | ORAL_TABLET | Freq: Every evening | ORAL | Status: DC | PRN
  Filled 2014-04-24: qty 1

## 2014-04-24 MED ORDER — PIPERACILLIN-TAZOBACTAM 3.375 G IVPB 30 MIN
3.3750 g | Freq: Once | INTRAVENOUS | Status: AC
  Administered 2014-04-24: 3.375 g via INTRAVENOUS
  Filled 2014-04-24: qty 50

## 2014-04-24 MED ORDER — ASPIRIN 81 MG PO CHEW
81.0000 mg | CHEWABLE_TABLET | Freq: Every evening | ORAL | Status: DC
  Administered 2014-04-25 – 2014-04-26 (×2): 81 mg via ORAL
  Filled 2014-04-24 (×2): qty 1

## 2014-04-24 MED ORDER — LACOSAMIDE 50 MG PO TABS
150.0000 mg | ORAL_TABLET | Freq: Every day | ORAL | Status: DC
  Administered 2014-04-25 – 2014-04-26 (×2): 150 mg via ORAL
  Filled 2014-04-24 (×4): qty 3

## 2014-04-24 MED ORDER — VANCOMYCIN HCL IN DEXTROSE 1-5 GM/200ML-% IV SOLN
1000.0000 mg | Freq: Two times a day (BID) | INTRAVENOUS | Status: DC
  Administered 2014-04-25 – 2014-04-26 (×3): 1000 mg via INTRAVENOUS
  Filled 2014-04-24 (×4): qty 200

## 2014-04-24 NOTE — Progress Notes (Signed)
ANTIBIOTIC CONSULT NOTE - INITIAL  Pharmacy Consult for Vancocin and Zosyn Indication: rule out pneumonia  Allergies  Allergen Reactions  . Topamax Other (See Comments)    Unknown reaction  . Uroxatral [Alfuzosin Hydrochloride] Other (See Comments)    Unknown reaction  . Valium Other (See Comments)    Unknown reaction    Patient Measurements: Weight: 104kg  Vital Signs: Temp: 99.1 F (37.3 C) (08/29 1752) Temp src: Oral (08/29 1752) BP: 146/59 mmHg (08/29 2230) Pulse Rate: 83 (08/29 2230)  Labs:  Recent Labs  04/24/14 1800  WBC 5.0  HGB 14.3  PLT 243  CREATININE 0.93   Medical History: Past Medical History  Diagnosis Date  . Hypertension   . Seizures   . A-fib   . Dehydration   . Deafness   . Anxiety   . Reflux   . Arthritis   . Depression   . Atrial fibrillation   . Tremor, essential   . Seizure   . BPH (benign prostatic hyperplasia)   . Syncope   . Deaf   . Melanoma   . GERD (gastroesophageal reflux disease)   . Mitral regurgitation   . Abnormality of gait     Assessment: 80yo male had syncopal episode at SNF, staff denies sz-like activity, CXR concerning for PNA, to begin IV ABX.  Goal of Therapy:  Vancomycin trough level 15-20 mcg/ml  Plan:  Rec'd vanc 2g and Zosyn 3.375g IV in ED; will continue with vancomycin 1g IV Q12H and Zosyn 3.375g IV Q8H and monitor CBC, Cx, levels prn.  Wynona Neat, PharmD, BCPS  04/24/2014,10:57 PM

## 2014-04-24 NOTE — ED Notes (Signed)
Per [facility], pt had syncopal episode at facility where he lives. Pt was in the bathroom and felt like he was going to pass out and woke up on the floor. Pt A&O X4. Pt has history of seizures but staff at facility denies seizure like activity. Pt denying any pain at this time.

## 2014-04-24 NOTE — ED Notes (Signed)
Pt remains monitored by blood pressure, pulse ox, and 12 lead. Pt remains on 2L via [place]. Pts daughter remains at bedside.

## 2014-04-24 NOTE — ED Notes (Signed)
Pt placed on monitor upon return to room from radiology. Pt monitored by 5 lead, blood pressure, and pulse ox. Sent urine culture down to lab as add on.

## 2014-04-24 NOTE — Progress Notes (Signed)
Pt admitted from the ED at about 2330, [facility] but denies any pain,settled in bed,call light within pt's reach,tele monitor put on pt, will continue to monitor. [place]-As[place], Crosby Bevan Efe

## 2014-04-24 NOTE — ED Notes (Signed)
Pt remains monitored by blood pressure, pulse ox, and 12 lead. Pts daughter remains at bedside.

## 2014-04-24 NOTE — ED Notes (Signed)
Pt placed into gown and on monitor upon arrival to room. Pt monitored by blood pressure, pulse ox, and 12 lead. Pts EKG given to Dr. Mingo Amber. Pts family remains at bedside.

## 2014-04-24 NOTE — ED Provider Notes (Signed)
CSN: [id number]     Arrival date & time 04/24/14  80 History   First MD Initiated Contact with Patient 04/24/14 1732     Chief Complaint  Patient presents with  . Loss of Consciousness     (Consider location/radiation/quality/duration/timing/severity/associated sxs/prior Treatment) Patient is a 80 y.o. male presenting with syncope. The history is provided by the patient and the EMS personnel.  Loss of Consciousness Episode history:  Single Most recent episode:  Today Duration: unknown. Timing: once. Progression:  Resolved Chronicity:  New Context comment:  In the bathroom Witnessed: no   Relieved by:  Nothing Worsened by:  Nothing tried Associated symptoms: no chest pain, no fever, no palpitations, no recent fall, no recent injury, no shortness of breath, no vomiting and no weakness     Past Medical History  Diagnosis Date  . Hypertension   . Seizures   . A-fib   . Dehydration   . Deafness   . Anxiety   . Reflux   . Arthritis   . Depression   . Atrial fibrillation   . Tremor, essential   . Seizure   . BPH (benign prostatic hyperplasia)   . Syncope   . Deaf   . Melanoma   . GERD (gastroesophageal reflux disease)   . Mitral regurgitation   . Abnormality of gait    Past Surgical History  Procedure Laterality Date  . Unknown    . Appendectomy    . Cataract extraction w/ intraocular lens  implant, bilateral     Family History  Problem Relation Age of Onset  . Cancer Mother     Skin cancer  . Stroke Father   . Parkinsonism Sister   . Diabetes Brother    History  Substance Use Topics  . Smoking status: Never Smoker   . Smokeless tobacco: Not on file  . Alcohol Use: No    Review of Systems  Constitutional: Negative for fever and chills.  Respiratory: Negative for cough and shortness of breath.   Cardiovascular: Positive for syncope. Negative for chest pain and palpitations.  Gastrointestinal: Negative for vomiting and abdominal pain.  Neurological:  Negative for weakness.  All other systems reviewed and are negative.     Allergies  Topamax; Uroxatral; and Valium  Home Medications   Prior to Admission medications   Medication Sig Start Date End Date Taking? Authorizing Provider  acetaminophen (TYLENOL) 500 MG tablet Take 500 mg by mouth every 6 (six) hours as needed for pain. Not to exceed 3 gms of acetaminophen in 24 hours    Historical Provider, MD  amLODipine-benazepril (LOTREL) 10-40 MG per capsule Take 1 capsule by mouth daily.     Historical Provider, MD  aspirin 81 MG chewable tablet Chew 81 mg by mouth every evening.     Historical Provider, MD  cloNIDine (CATAPRES) 0.1 MG tablet Take 0.1 mg by mouth 2 (two) times daily.    Historical Provider, MD  Emollient Jilda Panda ADVANCED THERAPY) LOTN Apply 1 application topically daily. Apply to feet after bathing every day    Historical Provider, MD  ipratropium-albuterol (DUONEB) 0.5-2.5 (3) MG/3ML SOLN Take 3 mLs by nebulization 2 (two) times daily. 11/12/12   Bobby Rumpf York, PA-C  iron polysaccharides (NIFEREX) 150 MG capsule Take 1 capsule (150 mg total) by mouth 2 (two) times daily. 11/12/12   Bobby Rumpf York, PA-C  labetalol (NORMODYNE) 100 MG tablet Take 100 mg by mouth 2 (two) times daily.     Historical Provider, MD  Lacosamide (VIMPAT) 150 MG TABS Take 1 tablet (150 mg total) by mouth at bedtime. 09/02/13   Kathrynn Ducking, MD  levETIRAcetam (KEPPRA) 750 MG tablet Take 750 mg by mouth 2 (two) times daily.     Historical Provider, MD  levofloxacin (LEVAQUIN) 750 MG tablet Take 1 tablet (750 mg total) by mouth daily. 09/16/13   Charlynne Cousins, MD  omeprazole (PRILOSEC) 20 MG capsule Take 20 mg by mouth daily.    Historical Provider, MD  polyethylene glycol (MIRALAX / GLYCOLAX) packet Take 17 g by mouth every other day.     Historical Provider, MD  promethazine (PHENERGAN) 12.5 MG tablet Take 12.5 mg by mouth every 6 (six) hours as needed for nausea or vomiting.    Historical  Provider, MD  sennosides-docusate sodium (SENOKOT-S) 8.6-50 MG tablet Take 1 tablet by mouth at bedtime as needed for constipation.    Historical Provider, MD   There were no vitals taken for this visit. Physical Exam  Nursing note and vitals reviewed. Constitutional: He appears well-developed and well-nourished. No distress.  HENT:  Head: Normocephalic and atraumatic.  Mouth/Throat: No oropharyngeal exudate.  Eyes: EOM are normal. Pupils are equal, round, and reactive to light.  Neck: Normal range of motion. Neck supple.  Cardiovascular: Normal rate and regular rhythm.  Exam reveals no friction rub.   No murmur heard. Pulmonary/Chest: Effort normal and breath sounds normal. No respiratory distress. He has no wheezes. He has no rales.  Abdominal: He exhibits no distension. There is no tenderness. There is no rebound.  Musculoskeletal: Normal range of motion. He exhibits no edema.  Neurological: He is alert. No cranial nerve deficit or sensory deficit. He exhibits normal muscle tone. GCS eye subscore is 4. GCS verbal subscore is 4. GCS motor subscore is 6.  Skin: He is not diaphoretic.    ED Course  Procedures (including critical care time) Labs Review Labs Reviewed  CBC  COMPREHENSIVE METABOLIC PANEL  URINALYSIS, ROUTINE W REFLEX MICROSCOPIC  I-STAT TROPOININ, ED    Imaging Review Dg Chest 2 View  04/24/2014   CLINICAL DATA:  80 year old male with syncope and hypoxia.  EXAM: CHEST  2 VIEW  COMPARISON:  10/02/2013 and prior chest radiographs  FINDINGS: The cardiomediastinal silhouette is unremarkable.  New bibasilar opacities may represent atelectasis versus airspace disease/pneumonia.  A large hiatal hernia is again noted.  There is no evidence of pleural effusion, pneumothorax or pulmonary edema.  IMPRESSION: New bibasilar opacities -question atelectasis versus airspace disease/ pneumonia.  Large hiatal hernia again noted.   Electronically Signed   By: Hassan Rowan M.D.   On: 04/24/2014  20:23   Dg Pelvis 1-2 Views  04/24/2014   CLINICAL DATA:  Fall and pelvic pain.  EXAM: PELVIS - 1-2 VIEW  COMPARISON:  None.  FINDINGS: There is no evidence of pelvic fracture or diastasis. No other pelvic bone lesions are seen.  IMPRESSION: Negative.   Electronically Signed   By: Hassan Rowan M.D.   On: 04/24/2014 20:23   Ct Head Wo Contrast  04/24/2014   CLINICAL DATA:  Syncopal episode while on the commode.  EXAM: CT HEAD WITHOUT CONTRAST  TECHNIQUE: Contiguous axial images were obtained from the base of the skull through the vertex without intravenous contrast.  COMPARISON:  CT head without contrast 09/13/2013  FINDINGS: Mild to moderate generalized atrophy and white matter disease is not significantly changed. No acute infarct, hemorrhage, or mass lesion is present. The ventricles are of normal size. No  significant extraaxial fluid collection is present.  The paranasal sinuses and mastoid air cells are clear. The osseous skull is intact. No significant extraosseous soft tissue injury is evident.  IMPRESSION: 1. No acute intracranial abnormality or significant interval change. 2. Stable atrophy and white matter disease. 3. No evidence for acute trauma.   Electronically Signed   By: Lawrence Santiago M.D.   On: 04/24/2014 18:20     EKG Interpretation   Date/Time:  Saturday April 24 2014 17:49:05 EDT Ventricular Rate:  67 PR Interval:  183 QRS Duration: 84 QT Interval:  377 QTC Calculation: 398 R Axis:   99 Text Interpretation:  Sinus rhythm Right axis deviation Similar to prior  Confirmed by Mingo Amber  MD, [place] ([id number]) on 04/24/2014 11:42:21 PM      MDM   Final diagnoses:  None  Diagnosis - Syncope UTI Hypoxia  75M here with syncopal episode. States he was trying to use the bathroom, this was after daughter prompted him by telling him he was in the bathroom. Initially told me he was walking around the building CBG ok with EMS. Hypoxic here, doesn't normally wear O2.  Afebrile, normal HR and  BP. No bony deformities on exam. Could be vasovagal syncope with trying to the use the bathroom, but daughter states he's not acting like himself, will do broad based workup to look for problem.    Evelina Bucy, MD 04/24/14 [phone]

## 2014-04-25 ENCOUNTER — Inpatient Hospital Stay (HOSPITAL_COMMUNITY): Payer: Medicare Other

## 2014-04-25 ENCOUNTER — Encounter (HOSPITAL_COMMUNITY): Payer: Self-pay | Admitting: *Deleted

## 2014-04-25 DIAGNOSIS — J189 Pneumonia, unspecified organism: Principal | ICD-10-CM

## 2014-04-25 DIAGNOSIS — R55 Syncope and collapse: Secondary | ICD-10-CM

## 2014-04-25 DIAGNOSIS — I10 Essential (primary) hypertension: Secondary | ICD-10-CM

## 2014-04-25 DIAGNOSIS — G40909 Epilepsy, unspecified, not intractable, without status epilepticus: Secondary | ICD-10-CM

## 2014-04-25 LAB — COMPREHENSIVE METABOLIC PANEL
ALT: 17 U/L (ref 0–53)
ALT: 20 U/L (ref 0–53)
ANION GAP: 13 (ref 5–15)
ANION GAP: 13 (ref 5–15)
AST: 23 U/L (ref 0–37)
AST: 23 U/L (ref 0–37)
Albumin: 3.6 g/dL (ref 3.5–5.2)
Albumin: 3.9 g/dL (ref 3.5–5.2)
Alkaline Phosphatase: 70 U/L (ref 39–117)
Alkaline Phosphatase: 75 U/L (ref 39–117)
BILIRUBIN TOTAL: 0.9 mg/dL (ref 0.3–1.2)
BUN: 10 mg/dL (ref 6–23)
BUN: 13 mg/dL (ref 6–23)
CALCIUM: 9 mg/dL (ref 8.4–10.5)
CHLORIDE: 91 meq/L — AB (ref 96–112)
CO2: 22 mEq/L (ref 19–32)
CO2: 27 meq/L (ref 19–32)
CREATININE: 0.99 mg/dL (ref 0.50–1.35)
Calcium: 9.1 mg/dL (ref 8.4–10.5)
Chloride: 95 mEq/L — ABNORMAL LOW (ref 96–112)
Creatinine, Ser: 0.79 mg/dL (ref 0.50–1.35)
GFR calc Af Amer: 82 mL/min — ABNORMAL LOW (ref >90)
GFR calc non Af Amer: 78 mL/min — ABNORMAL LOW (ref >90)
GFR, EST NON AFRICAN AMERICAN: 71 mL/min — AB (ref >90)
GLUCOSE: 96 mg/dL (ref 70–99)
GLUCOSE: 108 mg/dL — AB (ref 70–99)
Potassium: 4.3 mEq/L (ref 3.7–5.3)
Potassium: 4.4 mEq/L (ref 3.7–5.3)
Sodium: 130 mEq/L — ABNORMAL LOW (ref 137–147)
Sodium: 131 mEq/L — ABNORMAL LOW (ref 137–147)
Total Bilirubin: 0.7 mg/dL (ref 0.3–1.2)
Total Protein: 6.8 g/dL (ref 6.0–8.3)
Total Protein: 7 g/dL (ref 6.0–8.3)

## 2014-04-25 LAB — CBC WITH DIFFERENTIAL/PLATELET
Basophils Absolute: 0 10*3/uL (ref 0.0–0.1)
Basophils Relative: 0 % (ref 0–1)
EOS ABS: 0.1 10*3/uL (ref 0.0–0.7)
EOS PCT: 2 % (ref 0–5)
HCT: 40.8 % (ref 39.0–52.0)
Hemoglobin: 14.3 g/dL (ref 13.0–17.0)
LYMPHS ABS: 1.8 10*3/uL (ref 0.7–4.0)
Lymphocytes Relative: 33 % (ref 12–46)
MCH: 31.6 pg (ref 26.0–34.0)
MCHC: 35 g/dL (ref 30.0–36.0)
MCV: 90.1 fL (ref 78.0–100.0)
MONO ABS: 0.7 10*3/uL (ref 0.1–1.0)
MONOS PCT: 12 % (ref 3–12)
NEUTROS PCT: 53 % (ref 43–77)
Neutro Abs: 2.9 10*3/uL (ref 1.7–7.7)
Platelets: 214 10*3/uL (ref 150–400)
RBC: 4.53 MIL/uL (ref 4.22–5.81)
RDW: 13.5 % (ref 11.5–15.5)
WBC: 5.5 10*3/uL (ref 4.0–10.5)

## 2014-04-25 LAB — BASIC METABOLIC PANEL
Anion gap: 13 (ref 5–15)
BUN: 11 mg/dL (ref 6–23)
CO2: 24 mEq/L (ref 19–32)
Calcium: 9.3 mg/dL (ref 8.4–10.5)
Chloride: 92 mEq/L — ABNORMAL LOW (ref 96–112)
Creatinine, Ser: 0.87 mg/dL (ref 0.50–1.35)
GFR, EST AFRICAN AMERICAN: 87 mL/min — AB (ref >90)
GFR, EST NON AFRICAN AMERICAN: 75 mL/min — AB (ref >90)
GLUCOSE: 96 mg/dL (ref 70–99)
POTASSIUM: 4 meq/L (ref 3.7–5.3)
Sodium: 129 mEq/L — ABNORMAL LOW (ref 137–147)

## 2014-04-25 LAB — CBC
HCT: 41.9 % (ref 39.0–52.0)
Hemoglobin: 15.2 g/dL (ref 13.0–17.0)
MCH: 31.9 pg (ref 26.0–34.0)
MCHC: 36.3 g/dL — AB (ref 30.0–36.0)
MCV: 87.8 fL (ref 78.0–100.0)
PLATELETS: 207 10*3/uL (ref 150–400)
RBC: 4.77 MIL/uL (ref 4.22–5.81)
RDW: 13.4 % (ref 11.5–15.5)
WBC: 7.1 10*3/uL (ref 4.0–10.5)

## 2014-04-25 LAB — STREP PNEUMONIAE URINARY ANTIGEN: Strep Pneumo Urinary Antigen: NEGATIVE

## 2014-04-25 LAB — LACTIC ACID, PLASMA: Lactic Acid, Venous: 1.1 mmol/L (ref 0.5–2.2)

## 2014-04-25 LAB — MRSA PCR SCREENING: MRSA BY PCR: NEGATIVE

## 2014-04-25 LAB — TSH: TSH: 1.88 u[IU]/mL (ref 0.350–4.500)

## 2014-04-25 LAB — TROPONIN I: Troponin I: <0.3 ng/mL (ref <0.30)

## 2014-04-25 MED ORDER — SODIUM CHLORIDE 0.9 % IV BOLUS (SEPSIS): 500.0000 mL | Freq: Once | INTRAVENOUS | Status: DC

## 2014-04-25 MED ORDER — PROMETHAZINE HCL 25 MG/ML IJ SOLN
12.5000 mg | Freq: Four times a day (QID) | INTRAMUSCULAR | Status: DC | PRN
  Administered 2014-04-25: 12.5 mg via INTRAVENOUS
  Filled 2014-04-25: qty 1

## 2014-04-25 MED ORDER — METOPROLOL TARTRATE 1 MG/ML IV SOLN
INTRAVENOUS | Status: AC
  Administered 2014-04-25: 2.5 mg via INTRAVENOUS
  Filled 2014-04-25: qty 5

## 2014-04-25 MED ORDER — ONDANSETRON HCL 4 MG/2ML IJ SOLN
4.0000 mg | Freq: Four times a day (QID) | INTRAMUSCULAR | Status: DC | PRN
  Administered 2014-04-25 (×2): 4 mg via INTRAVENOUS
  Filled 2014-04-25 (×3): qty 2

## 2014-04-25 MED ORDER — METOPROLOL TARTRATE 1 MG/ML IV SOLN
2.5000 mg | Freq: Once | INTRAVENOUS | Status: AC
  Administered 2014-04-25 (×2): 2.5 mg via INTRAVENOUS

## 2014-04-25 MED ORDER — LEVETIRACETAM IN NACL 750 MG/50ML IV SOLN
750.0000 mg | Freq: Once | INTRAVENOUS | Status: AC
Start: 2014-04-25 — End: 2014-04-25
  Administered 2014-04-25: 750 mg via INTRAVENOUS
  Filled 2014-04-25 (×2): qty 50

## 2014-04-25 MED ORDER — IOHEXOL 300 MG/ML  SOLN
100.0000 mL | Freq: Once | INTRAMUSCULAR | Status: AC | PRN
  Administered 2014-04-25: 100 mL via INTRAVENOUS

## 2014-04-25 NOTE — Progress Notes (Signed)
Per NP Katie Schorr repositioned NGT. Brown billious gastric contents present in low intermittent suction canister. Gastric sound heard when checking placement over mid epigastric and upper mid abdominal area Briefly increased gastric content flow to suction canister with adjustment and air bolus to NGT.

## 2014-04-25 NOTE — Progress Notes (Signed)
Notified MD of pt's continued N/V despite administration of zofran and phenergan. Advised MD that emesis had progressed from undigested food to dark brown emesis. NG to right nare, per MD order. Correct placement verified. MD to bedside to evaluate pt.

## 2014-04-25 NOTE — H&P (Signed)
[facility] History and Physical  Patient: Barry Dean  WFU:[id number]  DOB: 07-02-1924  DOS: the patient was seen and examined on 04/24/2014 PCP: [name], MD  Chief Complaint: Syncope  HPI: Barry Dean is a 80 y.o. male with Past medical history of hypertension, seizure, A. fib, chronic hyponatremia, GERD, seizure. The patient presented with a fall and loss of consciousness. He mentions that he was using the bathroom after brushing his teeth and he felt dizzy and lightheaded and next thing he remembers is he was on the ground. Prior to this and reported to the ER physician that she was walking in the building and sat on the ground. Patient appears to be poor historian but denies any complaint of chest pain, shortness of breath, dizziness, lightheadedness, nausea, abdominal pain, palpitation, focal deficit, diarrhea or constipation at the time of my evaluation. Initially it was reported that the patient is not acting himself by the daughter.  The patient is coming from SNF. And at his baseline dependent for most of his ADL.  Review of Systems: as mentioned in the history of present illness.  A Comprehensive review of the other systems is negative.  Past Medical History  Diagnosis Date  . Hypertension   . Seizures   . A-fib   . Dehydration   . Deafness   . Anxiety   . Reflux   . Arthritis   . Depression   . Atrial fibrillation   . Tremor, essential   . Seizure   . BPH (benign prostatic hyperplasia)   . Syncope   . Deaf   . Melanoma   . GERD (gastroesophageal reflux disease)   . Mitral regurgitation   . Abnormality of gait    Past Surgical History  Procedure Laterality Date  . Unknown    . Appendectomy    . Cataract extraction w/ intraocular lens  implant, bilateral     Social History:  reports that he has never smoked. He does not have any smokeless tobacco history on file. He reports that he does not drink alcohol or use illicit  drugs.  Allergies  Allergen Reactions  . Topamax Other (See Comments)    Unknown reaction  . Uroxatral [Alfuzosin Hydrochloride] Other (See Comments)    Unknown reaction  . Valium Other (See Comments)    Unknown reaction    Family History  Problem Relation Age of Onset  . Cancer Mother     Skin cancer  . Stroke Father   . Parkinsonism Sister   . Diabetes Brother     Prior to Admission medications   Medication Sig Start Date End Date Taking? Authorizing Provider  acetaminophen (TYLENOL) 500 MG tablet Take 500 mg by mouth every 6 (six) hours as needed for pain. Not to exceed 3 gms of acetaminophen in 24 hours   Yes Historical Provider, MD  amLODipine-benazepril (LOTREL) 10-40 MG per capsule Take 1 capsule by mouth daily.    Yes Historical Provider, MD  aspirin 81 MG chewable tablet Chew 81 mg by mouth every evening.    Yes Historical Provider, MD  cloNIDine (CATAPRES) 0.1 MG tablet Take 0.1 mg by mouth 2 (two) times daily.   Yes Historical Provider, MD  Emollient Jilda Panda ADVANCED THERAPY) LOTN Apply 1 application topically daily. Apply to feet after bathing every day   Yes Historical Provider, MD  ipratropium-albuterol (DUONEB) 0.5-2.5 (3) MG/3ML SOLN Take 3 mLs by nebulization 2 (two) times daily. 11/12/12  Yes Bobby Rumpf York, PA-C  iron  polysaccharides (NIFEREX) 150 MG capsule Take 1 capsule (150 mg total) by mouth 2 (two) times daily. 11/12/12  Yes Bobby Rumpf York, PA-C  labetalol (NORMODYNE) 100 MG tablet Take 100 mg by mouth 2 (two) times daily.    Yes Historical Provider, MD  Lacosamide (VIMPAT) 150 MG TABS Take 1 tablet (150 mg total) by mouth at bedtime. 09/02/13  Yes Kathrynn Ducking, MD  levETIRAcetam (KEPPRA) 750 MG tablet Take 750 mg by mouth 2 (two) times daily.    Yes Historical Provider, MD  omeprazole (PRILOSEC) 20 MG capsule Take 20 mg by mouth daily.   Yes Historical Provider, MD  polyethylene glycol (MIRALAX / GLYCOLAX) packet Take 17 g by mouth every other day.     Yes Historical Provider, MD  sennosides-docusate sodium (SENOKOT-S) 8.6-50 MG tablet Take 1 tablet by mouth at bedtime as needed for constipation.   Yes Historical Provider, MD    Physical Exam: Filed Vitals:   04/24/14 2230 04/24/14 2300 04/24/14 2307 04/24/14 2344  BP: 146/59 150/63  181/87  Pulse: 83 81  91  Temp:   98.1 F (36.7 C) 98.8 F (37.1 C)  TempSrc:   Oral Oral  Resp:    16  Height:    6\' 2"  (1.88 m)  Weight:    100.472 kg (221 lb 8 oz)  SpO2: 94% 94% 95% 96%    General: Alert, Awake and Oriented to Time, Place and Person. Appear in mild distress Eyes: PERRL ENT: Oral Mucosa clear moist. Neck: no JVD Cardiovascular: S1 and S2 Present, aortic systolic Murmur, Peripheral Pulses Present Respiratory: Bilateral Air entry equal and Decreased, Clear to Auscultation, no Crackles, no wheezes Abdomen: Bowel Sound Present, Soft and Non tender Skin: no Rash Extremities: Trace Pedal edema, no calf tenderness Neurologic: Grossly no focal neuro deficit.  Labs on Admission:  CBC:  Recent Labs Lab 04/24/14 1800  WBC 5.0  HGB 14.3  HCT 41.0  MCV 88.9  PLT 243    CMP     Component Value Date/Time   NA 129* 04/24/2014 2350   K 4.0 04/24/2014 2350   CL 92* 04/24/2014 2350   CO2 24 04/24/2014 2350   GLUCOSE 96 04/24/2014 2350   BUN 11 04/24/2014 2350   CREATININE 0.87 04/24/2014 2350   CALCIUM 9.3 04/24/2014 2350   PROT 7.1 04/24/2014 1800   ALBUMIN 4.0 04/24/2014 1800   AST 23 04/24/2014 1800   ALT 19 04/24/2014 1800   ALKPHOS 72 04/24/2014 1800   BILITOT 0.5 04/24/2014 1800   GFRNONAA 75* 04/24/2014 2350   GFRAA 87* 04/24/2014 2350    No results found for this basename: LIPASE, AMYLASE,  in the last 168 hours No results found for this basename: AMMONIA,  in the last 168 hours  No results found for this basename: CKTOTAL, CKMB, CKMBINDEX, TROPONINI,  in the last 168 hours BNP (last 3 results)  Recent Labs  09/13/13 1052  PROBNP 113.3    Radiological Exams on  Admission: Dg Chest 2 View  04/24/2014   CLINICAL DATA:  80 year old male with syncope and hypoxia.  EXAM: CHEST  2 VIEW  COMPARISON:  10/02/2013 and prior chest radiographs  FINDINGS: The cardiomediastinal silhouette is unremarkable.  New bibasilar opacities may represent atelectasis versus airspace disease/pneumonia.  A large hiatal hernia is again noted.  There is no evidence of pleural effusion, pneumothorax or pulmonary edema.  IMPRESSION: New bibasilar opacities -question atelectasis versus airspace disease/ pneumonia.  Large hiatal hernia again noted.  Electronically Signed   By: Hassan Rowan M.D.   On: 04/24/2014 20:23   Dg Pelvis 1-2 Views  04/24/2014   CLINICAL DATA:  Fall and pelvic pain.  EXAM: PELVIS - 1-2 VIEW  COMPARISON:  None.  FINDINGS: There is no evidence of pelvic fracture or diastasis. No other pelvic bone lesions are seen.  IMPRESSION: Negative.   Electronically Signed   By: Hassan Rowan M.D.   On: 04/24/2014 20:23   Ct Head Wo Contrast  04/24/2014   CLINICAL DATA:  Syncopal episode while on the commode.  EXAM: CT HEAD WITHOUT CONTRAST  TECHNIQUE: Contiguous axial images were obtained from the base of the skull through the vertex without intravenous contrast.  COMPARISON:  CT head without contrast 09/13/2013  FINDINGS: Mild to moderate generalized atrophy and white matter disease is not significantly changed. No acute infarct, hemorrhage, or mass lesion is present. The ventricles are of normal size. No significant extraaxial fluid collection is present.  The paranasal sinuses and mastoid air cells are clear. The osseous skull is intact. No significant extraosseous soft tissue injury is evident.  IMPRESSION: 1. No acute intracranial abnormality or significant interval change. 2. Stable atrophy and white matter disease. 3. No evidence for acute trauma.   Electronically Signed   By: Lawrence Santiago M.D.   On: 04/24/2014 18:20    EKG: Independently reviewed. normal sinus rhythm, nonspecific ST  and T waves changes. Assessment/Plan Active Problems:   HCAP (healthcare-associated pneumonia)   Seizure disorder   HTN (hypertension)   Hyponatremia   Syncope   1. HCAP (healthcare-associated pneumonia) The patient is presenting with complaints of syncope, so far his workup including EKG troponin and CT of the head are unremarkable for any acute abnormality. His chest x-ray shows there is presence of pneumonia. He is not hypotensive ER does not have any fever or tachycardia but does have worsening of his chronic hyponatremia. With this the patient will be admitted in the hospital on telemetry. We will follow cultures and antigens and currently treated him with broad-spectrum antibiotic due to his husband in association. Patient will be kept n.p.o. I would also obtain speech therapy evaluation in the morning. PTOT consultation will also be obtained. Monitoring on telemetry overnight.  2. Hyponatremia. Patient has chronic hyponatremia likely worsening is secondary to poor intake versus infection versus stress. At present patient has received IV hydration. We will continue to monitor patient sodium. Serial neuro checks.  3. Seizure disorder. Continuing his home seizure medication at present. The family and the nursing staff did not report any seizure-like activity when the patient had a fall.  4. Hypertension. At present continue clonidine and labetalol and holding of the medications. Continuing aspirin.   Consults: PTOT speech  DVT Prophylaxis: subcutaneous Heparin Nutrition: N.p.o.  Code Status: DNR/DNI  Disposition: Admitted to inpatient in telemetry unit.  Author: Berle Mull, MD [facility] Pager: [phone]   If 7PM-7AM, please contact night-coverage [email or web address] Password [email or web address]  **Disclaimer: This note may have been dictated with voice recognition software. Similar sounding words can inadvertently be transcribed and this note may contain transcription  errors which may not have been corrected upon publication of note.**

## 2014-04-25 NOTE — Progress Notes (Signed)
Report given to [facility]. PM MD notified review KUB for NG placement.

## 2014-04-25 NOTE — Progress Notes (Signed)
Called by RN, patient having multiple episodes of N/V despite the administration of IV zofran and IV phenergan, vomited about 7 times today. He also complained of abdominal pain. I evaluated patient at bedside, currently does not seem to be in acute distress. Abdomen appears mildly distended.  Plan: 1) Place NG tube  2) NPO 3) [place] KUB, EKG, cycle 3 sets of troponins 4) CT abdomen

## 2014-04-25 NOTE — Evaluation (Signed)
Clinical/Bedside Swallow Evaluation Patient Details  Name: AMAREON PHUNG MRN: [id number] Date of Birth: [birth year]  Today's Date: 04/25/2014 Time: 3546-5681 SLP Time Calculation (min): 11 min  Past Medical History:  Past Medical History  Diagnosis Date  . Hypertension   . Seizures   . A-fib   . Dehydration   . Deafness   . Anxiety   . Reflux   . Arthritis   . Depression   . Atrial fibrillation   . Tremor, essential   . Seizure   . BPH (benign prostatic hyperplasia)   . Syncope   . Deaf   . Melanoma   . GERD (gastroesophageal reflux disease)   . Mitral regurgitation   . Abnormality of gait    Past Surgical History:  Past Surgical History  Procedure Laterality Date  . Unknown    . Appendectomy    . Cataract extraction w/ intraocular lens  implant, bilateral     HPI:  80 y.o. male with past medical history of hypertension, seizure, A. fib, chronic hyponatremia, GERD, seizure admitted with syncope and hypoxia, HCAP.     Assessment / Plan / Recommendation Clinical Impression  Pt presents with normal oropharyngeal swallow with sufficient mastication, swift swallow trigger, no indications of aspiration, adequate coordination of swallow/ventilation.  Rec continue current regular diet with thin liquids.  No SLP f/u.      Aspiration Risk  Mild    Diet Recommendation Regular;Thin liquid   Liquid Administration via: Cup;Straw Medication Administration: Whole meds with liquid Supervision: Patient able to self feed    Other  Recommendations Oral Care Recommendations: Oral care BID       Swallow Study Prior Functional Status       General Date of Onset: 04/24/14 HPI: 80 y.o. male with past medical history of hypertension, seizure, A. fib, chronic hyponatremia, GERD, seizure admitted with syncope and hypoxia, HCAP.   Type of Study: Bedside swallow evaluation Previous Swallow Assessment: MBS Nov 2012 with no sign findings Diet Prior to this Study: Regular;Thin  liquids Temperature Spikes Noted: No Respiratory Status: Nasal cannula History of Recent Intubation: No Behavior/Cognition: Alert;Cooperative Oral Cavity - Dentition: Dentures, top;Dentures, bottom Self-Feeding Abilities: Able to feed self Patient Positioning: Upright in bed Baseline Vocal Quality: Clear Volitional Cough: Strong Volitional Swallow: Able to elicit    Oral/Motor/Sensory Function Overall Oral Motor/Sensory Function: Appears within functional limits for tasks assessed   Ice Chips Ice chips: Within functional limits   Thin Liquid Thin Liquid: Within functional limits Presentation: Cup;Straw    Nectar Thick Nectar Thick Liquid: Not tested   Honey Thick Honey Thick Liquid: Not tested   Puree Puree: Within functional limits Presentation: [place]. [place], [place] CCC/SLP Pager [phone]  Solid: Within functional limits Presentation: Self Fed       [name] 04/25/2014,11:58 AM

## 2014-04-25 NOTE — Progress Notes (Signed)
[facility] PROGRESS NOTE  ANOTHONY BURSCH IOX:[id number] DOB: 10/13/1923 DOA: 04/24/2014 PCP: [name], MD  Assessment/Plan: 1. Syncope -Unclear etiology, reported feeling dizzy and lightheaded prior to event. Could be secondary to orthostasis from autonomic dysfunction in setting of Parkinson's disease -Will check orthostatics now -Labs otherwise unremarkable with negative troponin, EKG not revealing ischemic changes, kidney function stable. -Continue supportive care  2. Question healthcare associated pneumonia -Although patient does not appear to present with clinical signs and symptoms of pneumonia a chest x-ray showed bibasilar infiltrates which could be related to atelectasis versus pneumonia -He does not have cough, fever, sputum production, or elevated white count -He was started on broad-spectrum empiric IV antibiotic therapy -Plan to repeat chest x-ray in a.m., if negative will discontinue IV antibiotic therapy and monitor off of antimicrobials  3. History of seizures -Nursing home staff did not witness seizure-like activity nor did there appear to be postictal confusion -Will continue Keppra 750 mg twice a day for now -There has not been seizure activity witnessed since admission  4. Hypertension -Continue 100 mg twice a day of labetalol and 0.1 mg twice a day of clonidine  Code Status: DO NOT RESUSCITATE Family Communication: Spoke with patient's daughter present at bedside Disposition Plan: Continue supportive care, despite discharge to his facility when stable   Consultants:  Physical therapy  Antibiotics:  Vancomycin  Zosyn  HPI/Subjective: Patient having no complaints this morning. Admitted overnight after having a syncopal episode earlier in the day.  Objective: Filed Vitals:   04/25/14 0606  BP: 147/74  Pulse: 78  Temp: 98.5 F (36.9 C)  Resp: 16    Intake/Output Summary (Last 24 hours) at 04/25/14 1142 Last data filed at  04/25/14 0429  Gross per 24 hour  Intake      0 ml  Output    800 ml  Net   -800 ml   Filed Weights   04/24/14 2344  Weight: 100.472 kg (221 lb 8 oz)    Exam:   General:  Nontoxic appearing, awake alert oriented  Cardiovascular: Regular rate and rhythm normal S1-S2  Respiratory: Normal respiratory effort, lungs overall clear to auscultation bilaterally  Abdomen: Soft nontender nondistended  Musculoskeletal: Trace edema to lower extremity bilaterally  Data Reviewed: Basic Metabolic Panel:  Recent Labs Lab 04/24/14 1800 04/24/14 2350 04/25/14 0705  NA 128* 129* 130*  K 4.6 4.0 4.3  CL 93* 92* 95*  CO2 24 24 22   GLUCOSE 102* 96 96  BUN 14 11 10   CREATININE 0.93 0.87 0.79  CALCIUM 9.3 9.3 9.0   Liver Function Tests:  Recent Labs Lab 04/24/14 1800 04/25/14 0705  AST 23 23  ALT 19 17  ALKPHOS 72 70  BILITOT 0.5 0.7  PROT 7.1 6.8  ALBUMIN 4.0 3.6   No results found for this basename: LIPASE, AMYLASE,  in the last 168 hours No results found for this basename: AMMONIA,  in the last 168 hours CBC:  Recent Labs Lab 04/24/14 1800 04/25/14 0705  WBC 5.0 5.5  NEUTROABS  --  2.9  HGB 14.3 14.3  HCT 41.0 40.8  MCV 88.9 90.1  PLT 243 214   Cardiac Enzymes: No results found for this basename: CKTOTAL, CKMB, CKMBINDEX, TROPONINI,  in the last 168 hours BNP (last 3 results)  Recent Labs  09/13/13 1052  PROBNP 113.3   CBG: No results found for this basename: GLUCAP,  in the last 168 hours  Recent Results (from the past 240 hour(s))  MRSA PCR SCREENING     Status: None   Collection Time    04/24/14 11:56 PM      Result Value Ref Range Status   MRSA by PCR NEGATIVE  NEGATIVE Final   Comment:            The GeneXpert MRSA Assay (FDA     approved for NASAL specimens     only), is one component of a     comprehensive MRSA colonization     surveillance program. It is not     intended to diagnose MRSA     infection nor to guide or     monitor  treatment for     MRSA infections.     Studies: Dg Chest 2 View  04/24/2014   CLINICAL DATA:  80 year old male with syncope and hypoxia.  EXAM: CHEST  2 VIEW  COMPARISON:  10/02/2013 and prior chest radiographs  FINDINGS: The cardiomediastinal silhouette is unremarkable.  New bibasilar opacities may represent atelectasis versus airspace disease/pneumonia.  A large hiatal hernia is again noted.  There is no evidence of pleural effusion, pneumothorax or pulmonary edema.  IMPRESSION: New bibasilar opacities -question atelectasis versus airspace disease/ pneumonia.  Large hiatal hernia again noted.   Electronically Signed   By: Hassan Rowan M.D.   On: 04/24/2014 20:23   Dg Pelvis 1-2 Views  04/24/2014   CLINICAL DATA:  Fall and pelvic pain.  EXAM: PELVIS - 1-2 VIEW  COMPARISON:  None.  FINDINGS: There is no evidence of pelvic fracture or diastasis. No other pelvic bone lesions are seen.  IMPRESSION: Negative.   Electronically Signed   By: Hassan Rowan M.D.   On: 04/24/2014 20:23   Ct Head Wo Contrast  04/24/2014   CLINICAL DATA:  Syncopal episode while on the commode.  EXAM: CT HEAD WITHOUT CONTRAST  TECHNIQUE: Contiguous axial images were obtained from the base of the skull through the vertex without intravenous contrast.  COMPARISON:  CT head without contrast 09/13/2013  FINDINGS: Mild to moderate generalized atrophy and white matter disease is not significantly changed. No acute infarct, hemorrhage, or mass lesion is present. The ventricles are of normal size. No significant extraaxial fluid collection is present.  The paranasal sinuses and mastoid air cells are clear. The osseous skull is intact. No significant extraosseous soft tissue injury is evident.  IMPRESSION: 1. No acute intracranial abnormality or significant interval change. 2. Stable atrophy and white matter disease. 3. No evidence for acute trauma.   Electronically Signed   By: Lawrence Santiago M.D.   On: 04/24/2014 18:20    Scheduled Meds: .  aspirin  81 mg Oral QPM  . cloNIDine  0.1 mg Oral BID  . enoxaparin (LOVENOX) injection  40 mg Subcutaneous Q24H  . ipratropium-albuterol  3 mL Nebulization BID  . iron polysaccharides  150 mg Oral BID  . labetalol  100 mg Oral BID  . lacosamide  150 mg Oral QHS  . levETIRAcetam  750 mg Oral BID  . pantoprazole  40 mg Oral Daily  . piperacillin-tazobactam (ZOSYN)  IV  3.375 g Intravenous Q8H  . vancomycin  1,000 mg Intravenous Q12H   Continuous Infusions:   Principal Problem:   HCAP (healthcare-associated pneumonia) Active Problems:   Seizure disorder   HTN (hypertension)   Hyponatremia   Syncope    Time spent: 35 min    Kelvin Cellar  [facility] Pager [phone]. If 7PM-7AM, please contact night-coverage at [email or web address], password [facility] 04/25/2014, 11:42  AM  LOS: 1 day

## 2014-04-25 NOTE — Progress Notes (Signed)
UR Completed.  Barry Dean Jane [phone] 04/25/2014  

## 2014-04-25 NOTE — Progress Notes (Signed)
PT Cancellation Note  Patient Details Name: Barry Dean MRN: [id number] DOB: May 08, 1924   Cancelled Treatment:    Reason Eval/Treat Not Completed: Other (comment). Family present and reports pt has been nauseous and vomiting today. Family would like PT to hold on evaluation till tomorrow.    Elie Confer [place], [place] 04/25/2014, 2:47 PM

## 2014-04-26 ENCOUNTER — Inpatient Hospital Stay (HOSPITAL_COMMUNITY): Payer: Medicare Other

## 2014-04-26 DIAGNOSIS — R1115 Cyclical vomiting syndrome unrelated to migraine: Secondary | ICD-10-CM

## 2014-04-26 DIAGNOSIS — K5289 Other specified noninfective gastroenteritis and colitis: Secondary | ICD-10-CM

## 2014-04-26 LAB — LEGIONELLA ANTIGEN, URINE: Legionella Antigen, Urine: NEGATIVE

## 2014-04-26 LAB — BASIC METABOLIC PANEL
ANION GAP: 14 (ref 5–15)
BUN: 13 mg/dL (ref 6–23)
CO2: 25 meq/L (ref 19–32)
Calcium: 9.1 mg/dL (ref 8.4–10.5)
Chloride: 92 mEq/L — ABNORMAL LOW (ref 96–112)
Creatinine, Ser: 0.95 mg/dL (ref 0.50–1.35)
GFR calc Af Amer: 84 mL/min — ABNORMAL LOW (ref >90)
GFR, EST NON AFRICAN AMERICAN: 72 mL/min — AB (ref >90)
GLUCOSE: 89 mg/dL (ref 70–99)
POTASSIUM: 4 meq/L (ref 3.7–5.3)
SODIUM: 131 meq/L — AB (ref 137–147)

## 2014-04-26 LAB — CBC
HCT: 43.4 % (ref 39.0–52.0)
Hemoglobin: 15.2 g/dL (ref 13.0–17.0)
MCH: 31.7 pg (ref 26.0–34.0)
MCHC: 35 g/dL (ref 30.0–36.0)
MCV: 90.6 fL (ref 78.0–100.0)
PLATELETS: 212 10*3/uL (ref 150–400)
RBC: 4.79 MIL/uL (ref 4.22–5.81)
RDW: 13.4 % (ref 11.5–15.5)
WBC: 6.6 10*3/uL (ref 4.0–10.5)

## 2014-04-26 LAB — URINE CULTURE

## 2014-04-26 LAB — TROPONIN I: Troponin I: <0.3 ng/mL (ref <0.30)

## 2014-04-26 MED ORDER — BISACODYL 10 MG RE SUPP
10.0000 mg | Freq: Once | RECTAL | Status: DC
  Filled 2014-04-26: qty 1

## 2014-04-26 MED ORDER — DOCUSATE SODIUM 100 MG PO CAPS
100.0000 mg | ORAL_CAPSULE | Freq: Two times a day (BID) | ORAL | Status: DC
  Administered 2014-04-26 (×2): 100 mg via ORAL
  Filled 2014-04-26 (×4): qty 1

## 2014-04-26 MED ORDER — POLYETHYLENE GLYCOL 3350 17 G PO PACK
17.0000 g | PACK | Freq: Every day | ORAL | Status: DC
  Administered 2014-04-26: 17 g via ORAL
  Filled 2014-04-26 (×3): qty 1

## 2014-04-26 NOTE — Evaluation (Signed)
Physical Therapy Evaluation Patient Details Name: Barry Dean MRN: [id number] DOB: Apr 29, 1924 Today's Date: 04/26/2014   History of Present Illness  Barry Dean is a 80 y.o. male with Past medical history of hypertension, seizure, A. fib, chronic hyponatremia, GERD, seizure. The patient presented with a fall and loss of consciousness. He mentions that he was using the bathroom after brushing his teeth and he felt dizzy and lightheaded and next thing he remembers is he was on the ground  Clinical Impression  Pt very pleasant and HOH but tolerating mobility today. Pt lives at [facility] and he and dgtr desire return there despite current need for assist with all mobility. Discussed this with dgtr but she does not appear interested in SNF at all. For return to ALF will need increased assist for mobility and ADLs as well as HHPT and recommend Supervision for all mobility. Will follow acutely to maximize mobility, strength, function and balance to decrease burden of care and fall risk. Pt encouraged to mobilize daily with staff.     Follow Up Recommendations Home health PT;Supervision for mobility/OOB    Equipment Recommendations  None recommended by PT    Recommendations for Other Services       Precautions / Restrictions Precautions Precautions: Fall Restrictions Weight Bearing Restrictions: No      Mobility  Bed Mobility Overal bed mobility: Needs Assistance Bed Mobility: Supine to Sit     Supine to sit: Min assist     General bed mobility comments: cues for sequence with assist to fully elevate trunk and increased time  Transfers Overall transfer level: Needs assistance   Transfers: Sit to/from Stand Sit to Stand: Min assist         General transfer comment: max cues to scoot fully to surface, position feet and anterior translation  Ambulation/Gait Ambulation/Gait assistance: Min guard Ambulation Distance (Feet): 300 Feet Assistive device: Rolling  walker (2 wheeled) Gait Pattern/deviations: Shuffle;Trunk flexed;Narrow base of support   Gait velocity interpretation: Below normal speed for age/gender General Gait Details: cues for posture, looking up and position in RW  Stairs            Wheelchair Mobility    Modified Rankin (Stroke Patients Only)       Balance Overall balance assessment: Needs assistance   Sitting balance-Leahy Scale: Fair       Standing balance-Leahy Scale: Poor                               Pertinent Vitals/Pain Pain Assessment: No/denies pain HR 75    Home Living Family/patient expects to be discharged to:: Assisted living               Home Equipment: Walker - 4 wheels;Toilet riser;Shower seat - built in      Prior Function Level of Independence: Needs assistance   Gait / Transfers Assistance Needed: requires elevated surfaces to stand, uses rollator, tends to have supervision with gait outside of his apt  ADL's / Homemaking Assistance Needed: staff assists with bathing and dressing and performs all housework        Hand Dominance        Extremity/Trunk Assessment   Upper Extremity Assessment: Generalized weakness           Lower Extremity Assessment: Generalized weakness      Cervical / Trunk Assessment: Kyphotic;Other exceptions  Communication   Communication: HOH  Cognition Arousal/Alertness: Awake/alert  Behavior During Therapy: Flat affect Overall Cognitive Status: Within Functional Limits for tasks assessed       Memory: Decreased short-term memory              General Comments      Exercises        Assessment/Plan    PT Assessment Patient needs continued PT services  PT Diagnosis Difficulty walking;Generalized weakness   PT Problem List Decreased strength;Decreased cognition;Decreased activity tolerance;Decreased safety awareness;Decreased balance;Decreased knowledge of use of DME;Decreased mobility  PT Treatment  Interventions Gait training;Functional mobility training;Therapeutic activities;Therapeutic exercise;Patient/family education;DME instruction;Balance training   PT Goals (Current goals can be found in the Care Plan section) Acute Rehab PT Goals Patient Stated Goal: return to [facility] PT Goal Formulation: With patient/family Time For Goal Achievement: 05/10/14 Potential to Achieve Goals: Fair    Frequency Min 4X/week   Barriers to discharge Decreased caregiver support staff not present at all times but dgtr to request increased assist    Co-evaluation               End of Session Equipment Utilized During Treatment: Gait belt Activity Tolerance: Patient tolerated treatment well Patient left: in chair;with call bell/phone within Dean;with chair alarm set;with family/visitor present Nurse Communication: Mobility status         Time: [id number] PT Time Calculation (min): 26 min   Charges:   PT Evaluation $Initial PT Evaluation Tier I: 1 Procedure PT Treatments $Gait Training: 8-22 mins   PT G CodesMelford Dean 04/26/2014, 8:25 AM Barry Dean, [place]

## 2014-04-26 NOTE — Progress Notes (Signed)
KUB still shows that NGT still coiled in hiatal hernia. Shut off low wall suction at 0001. Pt now nauseous.  Wall suction reapplied and evacuated 161ml brown secretions out. Negative ileus on CT. NP K Schorr notified. Order to give antiemetics and remove NGT.

## 2014-04-26 NOTE — Progress Notes (Signed)
[facility] PROGRESS NOTE  Barry Dean EHU:[id number] DOB: 1923-12-05 DOA: 04/24/2014 PCP: [name], MD  Interim Summary Patient is a pleasant 80 year old gentleman with a past medical history of hypertension, seizure disorder, atrial fibrillation, who was admitted to the medicine service on 01/22/2014. He had a syncope event at his skilled nursing facility. Patient brought in for a syncope workup. He had 3 negative troponins. Radiology reporting new bibasilar opacities on initial chest x-ray which could be consistent with pneumonia. He was started on broad-spectrum IV antimicrobial therapy with vancomycin and Zosyn although he did not seem to have clinical signs or symptoms suggestive of pneumonia. On the following day patient developing multiple episodes of nausea and vomiting associated with some abdominal distention, refractory to IV antiemetic therapy. An NG tube was placed as he was further worked up with a CT scan of abdomen and pelvis. This study did not reveal an acute abdominal process. Imaging did show a nasogastric tube coiled in large hiatal hernia. Overnight NG tube was removed. By the following day patient reported feeling significantly better, did not have further episodes of nausea and vomiting as his diet was advanced to clears. A repeat chest x-ray reported by radiology that bibasilar opacities are chronic and there were no clear acute findings. His IV antibiotic therapy were discontinued. Will plan to monitor patient off on antimicrobial therapy. If he continues to tolerate clears would recommend advancing his diet.  Assessment/Plan: 1. Nausea/vomiting/abdominal pain -Unclear etiology, could be secondary to enteritis -Symptoms resolving this morning. -He was worked up with a CT scan of abdomen and pelvis with IV contrast which did not show an acute intra-abdominal pathology. -KUB did reveal large hiatal hernia -His diet was advanced to clears today.  2.   Question healthcare associated pneumonia -Patient having a repeat chest x-ray this morning interpreted by radiology as having chronic bibasilar opacities. -He does not have clinical signs and symptoms suggestive of pneumonia -Will monitor patient off of antimicrobial therapy  3. Syncope. -Had 3 negative sets of troponins, with telemetry not showing changes -Could be related to orthostasis from autonomic dysfunction in setting of Parkinson's disease -Has not had further episodes of syncope during this hospitalization  4. History of seizure disorder -Does not appear that his syncopal event was secondary to seizures asthma symptoms have not witnessed seizure-like activity nor did there appear to be postictal confusion -Continue Keppra 750 mg twice daily  5.  Hypertension -Continue labetalol 100 mg twice a day and clonidine 0.1 mg twice a day  Code Status: DO NOT RESUSCITATE Family Communication: Spoke with his daughter present at bedside Disposition Plan: Advancing diet today, monitor patient off of antibiotic therapy   Consultants:  Physical therapy  Antibiotics:  Vancomycin, discontinued on 04/26/2014  Zosyn, discontinued on 04/26/2014  HPI/Subjective: Patient appears improved, did not have further episodes of nausea vomiting, NG tube removed overnight. He states feeling hungry and would like to have breakfast.   Objective: Filed Vitals:   04/26/14 1430  BP: 140/65  Pulse: 79  Temp: 98.7 F (37.1 C)  Resp: 18    Intake/Output Summary (Last 24 hours) at 04/26/14 1721 Last data filed at 04/26/14 0510  Gross per 24 hour  Intake    300 ml  Output   1150 ml  Net   -850 ml   Filed Weights   04/24/14 2344 04/26/14 0442  Weight: 100.472 kg (221 lb 8 oz) 97.297 kg (214 lb 8 oz)    Exam:   General:  Patient looks much better today, hallway color, asking for breakfast  Cardiovascular: Regular rate and rhythm normal S1-S2  Respiratory: Has a few bibasilar crackles,  normal respiratory effort, no wheezing or rales  Abdomen: Appears less distended today, soft nontender  Musculoskeletal: No edema  Data Reviewed: Basic Metabolic Panel:  Recent Labs Lab 04/24/14 1800 04/24/14 2350 04/25/14 0705 04/25/14 1904 04/26/14 0525  NA 128* 129* 130* 131* 131*  K 4.6 4.0 4.3 4.4 4.0  CL 93* 92* 95* 91* 92*  CO2 24 24 22 27 25   GLUCOSE 102* 96 96 108* 89  BUN 14 11 10 13 13   CREATININE 0.93 0.87 0.79 0.99 0.95  CALCIUM 9.3 9.3 9.0 9.1 9.1   Liver Function Tests:  Recent Labs Lab 04/24/14 1800 04/25/14 0705 04/25/14 1904  AST 23 23 23   ALT 19 17 20   ALKPHOS 72 70 75  BILITOT 0.5 0.7 0.9  PROT 7.1 6.8 7.0  ALBUMIN 4.0 3.6 3.9   No results found for this basename: LIPASE, AMYLASE,  in the last 168 hours No results found for this basename: AMMONIA,  in the last 168 hours CBC:  Recent Labs Lab 04/24/14 1800 04/25/14 0705 04/25/14 1904 04/26/14 0525  WBC 5.0 5.5 7.1 6.6  NEUTROABS  --  2.9  --   --   HGB 14.3 14.3 15.2 15.2  HCT 41.0 40.8 41.9 43.4  MCV 88.9 90.1 87.8 90.6  PLT 243 214 207 212   Cardiac Enzymes:  Recent Labs Lab 04/25/14 1903 04/25/14 2320 04/26/14 0525  TROPONINI <0.30 <0.30 <0.30   BNP (last 3 results)  Recent Labs  09/13/13 1052  PROBNP 113.3   CBG: No results found for this basename: GLUCAP,  in the last 168 hours  Recent Results (from the past 240 hour(s))  URINE CULTURE     Status: None   Collection Time    04/24/14  7:11 PM      Result Value Ref Range Status   Specimen Description URINE, CLEAN CATCH   Final   Special Requests ADD [id number]   Final   Culture  Setup Time     Final   Value: 04/24/2014 21:06     Performed at [facility]     Final   Value: 30,000 COLONIES/ML     Performed at [facility]   Culture     Final   Value: Multiple bacterial morphotypes present, none predominant. Suggest appropriate recollection if clinically indicated.      Performed at [facility]   Report Status 04/26/2014 FINAL   Final  CULTURE, BLOOD (ROUTINE X 2)     Status: None   Collection Time    04/24/14  9:28 PM      Result Value Ref Range Status   Specimen Description BLOOD RIGHT HAND   Final   Special Requests BOTTLES DRAWN AEROBIC AND ANAEROBIC 5CC EA   Final   Culture  Setup Time     Final   Value: 04/25/2014 01:12     Performed at [facility]   Culture     Final   Value:        BLOOD CULTURE RECEIVED NO GROWTH TO DATE CULTURE WILL BE HELD FOR 5 DAYS BEFORE ISSUING A FINAL NEGATIVE REPORT     Performed at [facility]   Report Status PENDING   Incomplete  CULTURE, BLOOD (ROUTINE X 2)     Status: None   Collection  Time    04/24/14  9:38 PM      Result Value Ref Range Status   Specimen Description BLOOD LEFT HAND   Final   Special Requests     Final   Value: BOTTLES DRAWN AEROBIC AND ANAEROBIC 5CC BLUE 4CC RED   Culture  Setup Time     Final   Value: 04/25/2014 01:13     Performed at [facility]   Culture     Final   Value:        BLOOD CULTURE RECEIVED NO GROWTH TO DATE CULTURE WILL BE HELD FOR 5 DAYS BEFORE ISSUING A FINAL NEGATIVE REPORT     Performed at [facility]   Report Status PENDING   Incomplete  MRSA PCR SCREENING     Status: None   Collection Time    04/24/14 11:56 PM      Result Value Ref Range Status   MRSA by PCR NEGATIVE  NEGATIVE Final   Comment:            The GeneXpert MRSA Assay (FDA     approved for NASAL specimens     only), is one component of a     comprehensive MRSA colonization     surveillance program. It is not     intended to diagnose MRSA     infection nor to guide or     monitor treatment for     MRSA infections.     Studies: Dg Chest 2 View  04/26/2014   CLINICAL DATA:  Pneumonia, weakness  EXAM: CHEST  2 VIEW  COMPARISON:  Radiograph 04/24/2014, 10/02/2013, CT 1/18/ 2015  FINDINGS: Normal cardiac silhouette with large hiatal hernia. There is  persistent reticular nodular pattern at the lung bases over multiple comparison chest x-rays and CT. Lungs are hyperinflated. Patient is kyphotic.  IMPRESSION: 1. No clear acute findings. 2. Bibasilar opacities are chronic. 3. Large hiatal hernia   Electronically Signed   By: Suzy Bouchard M.D.   On: 04/26/2014 07:50   Dg Chest 2 View  04/24/2014   CLINICAL DATA:  80 year old male with syncope and hypoxia.  EXAM: CHEST  2 VIEW  COMPARISON:  10/02/2013 and prior chest radiographs  FINDINGS: The cardiomediastinal silhouette is unremarkable.  New bibasilar opacities may represent atelectasis versus airspace disease/pneumonia.  A large hiatal hernia is again noted.  There is no evidence of pleural effusion, pneumothorax or pulmonary edema.  IMPRESSION: New bibasilar opacities -question atelectasis versus airspace disease/ pneumonia.  Large hiatal hernia again noted.   Electronically Signed   By: Hassan Rowan M.D.   On: 04/24/2014 20:23   Dg Pelvis 1-2 Views  04/24/2014   CLINICAL DATA:  Fall and pelvic pain.  EXAM: PELVIS - 1-2 VIEW  COMPARISON:  None.  FINDINGS: There is no evidence of pelvic fracture or diastasis. No other pelvic bone lesions are seen.  IMPRESSION: Negative.   Electronically Signed   By: Hassan Rowan M.D.   On: 04/24/2014 20:23   Ct Head Wo Contrast  04/24/2014   CLINICAL DATA:  Syncopal episode while on the commode.  EXAM: CT HEAD WITHOUT CONTRAST  TECHNIQUE: Contiguous axial images were obtained from the base of the skull through the vertex without intravenous contrast.  COMPARISON:  CT head without contrast 09/13/2013  FINDINGS: Mild to moderate generalized atrophy and white matter disease is not significantly changed. No acute infarct, hemorrhage, or mass lesion is present. The ventricles are of normal size. No significant  extraaxial fluid collection is present.  The paranasal sinuses and mastoid air cells are clear. The osseous skull is intact. No significant extraosseous soft tissue injury  is evident.  IMPRESSION: 1. No acute intracranial abnormality or significant interval change. 2. Stable atrophy and white matter disease. 3. No evidence for acute trauma.   Electronically Signed   By: Lawrence Santiago M.D.   On: 04/24/2014 18:20   Ct Abdomen Pelvis W Contrast  04/25/2014   CLINICAL DATA:  repeated episodes of N/V and ABD pain despite IV antiemetic therapy  EXAM: CT ABDOMEN AND PELVIS WITH CONTRAST  TECHNIQUE: Multidetector CT imaging of the abdomen and pelvis was performed using the standard protocol following bolus administration of intravenous contrast.  CONTRAST:  152mL OMNIPAQUE IOHEXOL 300 MG/ML  SOLN  COMPARISON:  Chest 09/13/2013 and earlier studies  FINDINGS: Nasogastric tube loops in a large hiatal hernia which involves the fundus and a portion of the gastric body. There are persistent patchy coarse airspace opacities in both lung bases left greater than right as before.  Unremarkable liver, gallbladder, spleen, adrenal glands, kidneys. Patchy aortoiliac calcifications without aneurysm or stenosis. Small bowel and colon are Unremarkable. Urinary bladder incompletely distended. Mild prostatic enlargement. No ascites. No free air. No adenopathy localized. No hydronephrosis. 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  IMPRESSION: 1. No acute abdominal process. 2. Nasogastric tube into large hiatal hernia.   Electronically Signed   By: Arne Cleveland M.D.   On: 04/25/2014 18:58   Dg Abd Portable 1v  04/25/2014   CLINICAL DATA:  NG tube adjustment  EXAM: PORTABLE ABDOMEN - 1 VIEW  COMPARISON:  04/25/2014 CT  FINDINGS: NG tube coils within the intrathoracic stomach /hiatal hernia. Bowel gas pattern nonspecific, nonobstructive. Atherosclerotic vascular calcifications. Curvature of the spine and multilevel degenerative changes. High density bladder in keeping with excreted contrast.  IMPRESSION: NG tube coils within the hiatal hernia.   Electronically Signed   By: Carlos Levering M.D.   On: 04/25/2014 22:15   Dg Abd Portable 1v  04/25/2014   CLINICAL DATA:  NG tube placement.  EXAM: PORTABLE ABDOMEN - 1 VIEW  COMPARISON:  CT 11/10/2012.  FINDINGS: Hiatal hernia is noted. NG tube is noted coiled in the hiatal hernia. No prominent bowel distention. Mild basilar atelectasis. Degenerative changes thoracolumbar spine.  IMPRESSION: NG tube noted coiled in hiatal hernia.   Electronically Signed   By: Marcello Moores  Register   On: 04/25/2014 17:58    Scheduled Meds: . aspirin  81 mg Oral QPM  . bisacodyl  10 mg Rectal Once  . cloNIDine  0.1 mg Oral BID  . docusate sodium  100 mg Oral BID  . enoxaparin (LOVENOX) injection  40 mg Subcutaneous Q24H  . ipratropium-albuterol  3 mL Nebulization BID  . iron polysaccharides  150 mg Oral BID  . labetalol  100 mg Oral BID  . lacosamide  150 mg Oral QHS  . levETIRAcetam  750 mg Oral BID  . pantoprazole  40 mg Oral Daily  . polyethylene glycol  17 g Oral Daily   Continuous Infusions:   Principal Problem:   HCAP (healthcare-associated pneumonia) Active Problems:   Seizure disorder   HTN (hypertension)   Hyponatremia   Syncope    Time spent: 35 min    Kelvin Cellar  [facility] Pager [phone]. If 7PM-7AM, please contact night-coverage at [email or web address], password [facility] 04/26/2014, 5:21 PM  LOS: 2 days

## 2014-04-26 NOTE — Evaluation (Signed)
Occupational Therapy Evaluation Patient Details Name: Barry Dean MRN: [id number] DOB: 1924/03/05 Today's Date: 04/26/2014    History of Present Illness Barry Dean is a 80 y.o. male with Past medical history of hypertension, seizure, A. fib, chronic hyponatremia, GERD, seizure. The patient presented with a fall and loss of consciousness. He mentions that he was using the bathroom after brushing his teeth and he felt dizzy and lightheaded and next thing he remembers is he was on the ground   Clinical Impression   Patient admitted with above. Patient requires assistance with bathing & dressing PTA, but was able to ambulate <> toilet for toileting needs at mod I level. Patient currently functioning at min assist level for functional mobility and functional transfers and total assist for toileting. Feel patient will benefit from acute OT to improve function in the areas of functional mobility using DME prn, functional transfers, toileting aspects, overall safety, dynamic standing balance, and overall activity tolerance/endurance. PTA, patient was living at [facility] and would receive assistance from staff there.    Follow Up Recommendations  Home health OT;Supervision/Assistance - 24 hour (Family refuse SNF recommendation, therefore recommend HHOT)    Equipment Recommendations  None recommended by OT (patient has built in shower seat and raised toilet seat)    Recommendations for Other Services  (none at this time)     Precautions / Restrictions Precautions Precautions: Fall Restrictions Weight Bearing Restrictions: No      Mobility Bed Mobility  Defer to PT evaluation                Transfers  Defer to PT evaluation                     Balance  Defer to PT evaluation                                          ADL Overall ADL's : At baseline;Needs assistance/impaired Eating/Feeding:  (at baseline)   Grooming: Minimal  assistance   Upper Body Bathing:  (at baseline)   Lower Body Bathing:  (at baseline)   Upper Body Dressing :  (at baseline)   Lower Body Dressing:  (at baseline)   Toilet Transfer: Minimal assistance;BSC;RW;Cueing for safety;Ambulation       Tub/ Shower Transfer: Minimal assistance;Shower seat;Ambulation;Rolling walker     General ADL Comments: Patient required assistance with BADLs & IADLs PTA, patient lived in an ALF PTA. At stated above in drop down boxes, patient at baseline for bathing & dressing tasks. Upon OT evaluation, patient required up to min assist for functional ambulation/mobility and min assist for functional transfers. Patient total assist for toileting tasks. According to patient's daughter, patient would complete toileting tasks at mod I level PTA.      Vision  Patient wears glasses at all times, no change from baseline.                          Pertinent Vitals/Pain Pain Assessment: No/denies pain     Hand Dominance Left   Extremity/Trunk Assessment Upper Extremity Assessment Upper Extremity Assessment: Generalized weakness   Lower Extremity Assessment Lower Extremity Assessment: Generalized weakness   Cervical / Trunk Assessment Cervical / Trunk Assessment: Kyphotic;Other exceptions Cervical / Trunk Exceptions: forward head   Communication Communication Communication: HOH   Cognition Arousal/Alertness: Awake/alert Behavior  During Therapy: Flat affect Overall Cognitive Status: Within Functional Limits for tasks assessed       Memory: Decreased short-term memory                        Home Living Family/patient expects to be discharged to:: Assisted living                             Home Equipment: Walker - 4 wheels;Toilet riser;Shower seat - built in          Prior Functioning/Environment Level of Independence: Needs assistance  Gait / Transfers Assistance Needed: requires elevated surfaces to stand,  uses rollator, tends to have supervision with gait outside of his apt ADL's / Homemaking Assistance Needed: staff assists with bathing and dressing and performs all housework. Patient able to perform grooming task of putting dentures in/out Communication / Swallowing Assistance Needed:  [facility])      OT Diagnosis: Generalized weakness   OT Problem List: Decreased strength;Decreased activity tolerance;Impaired balance (sitting and/or standing);Decreased safety awareness;Decreased knowledge of use of DME or AE   OT Treatment/Interventions: Self-care/ADL training;Therapeutic exercise;Energy conservation;DME and/or AE instruction;Therapeutic activities;Patient/family education;Balance training    OT Goals(Current goals can be found in the care plan section) Acute Rehab OT Goals Patient Stated Goal: return to [facility] OT Goal Formulation: With patient/family Time For Goal Achievement: 05/03/14 Potential to Achieve Goals: Good ADL Goals Pt Will Perform Grooming: with set-up (oral care, washing face, washing hands) Pt Will Transfer to Toilet: with supervision;ambulating (raised toilet seat) Pt Will Perform Toileting - Clothing Manipulation and hygiene: with supervision;sit to/from stand Pt Will Perform Tub/Shower Transfer: with supervision;shower seat;ambulating  OT Frequency: Min 2X/week   Barriers to D/C:    none known at this time, dtr aware of recommeded 24/7 supervision/assist once discharged          End of Session Equipment Utilized During Treatment: Gait belt;Rolling walker Nurse Communication: Other (comment) (patient's incontinence and need for catheter change)  Activity Tolerance: Patient tolerated treatment well Patient left: in chair;with call bell/phone within reach;with nursing/sitter in room;with family/visitor present (NT changing condom cath)   Time: 4008-6761 OT Time Calculation (min): 36 min Charges:  OT General Charges $OT Visit: 1 Procedure OT  Evaluation $Initial OT Evaluation Tier I: 1 Procedure OT Treatments $Self Care/Home Management : 23-37 mins Barry Dean 04/26/2014, 2:43 PM

## 2014-04-26 NOTE — Care Management Note (Unsigned)
    Page 1 of 1   04/26/2014     4:38:57 PM CARE MANAGEMENT NOTE 04/26/2014  Patient:  Barry Dean, Barry Dean   Account Number:  [id number]  Date Initiated:  04/26/2014  Documentation initiated by:  GRAVES-BIGELOW,Laverne Klugh  Subjective/Objective Assessment:   Pt admitted  for syncope. Pt states is from home with daughter.     Action/Plan:   CM will assist with disposition needs.   Anticipated DC Date:  04/27/2014   Anticipated DC Plan:  [place]  CM consult      Choice offered to / List presented to:             Status of service:  In process, will continue to follow Medicare Important Message given?  YES (If response is "NO", the following Medicare IM given date fields will be blank) Date Medicare IM given:  04/26/2014 Medicare IM given by:  GRAVES-BIGELOW,Randi Poullard Date Additional Medicare IM given:   Additional Medicare IM given by:    Discharge Disposition:    Per UR Regulation:  Reviewed for med. necessity/level of care/duration of stay  If discussed at [place] of Stay Meetings, dates discussed:   04/27/2014    Comments:

## 2014-04-27 MED ORDER — IPRATROPIUM-ALBUTEROL 0.5-2.5 (3) MG/3ML IN SOLN: 3.0000 mL | Freq: Four times a day (QID) | RESPIRATORY_TRACT | Status: DC | PRN

## 2014-04-27 NOTE — Progress Notes (Signed)
CSW (Clinical Education officer, museum) prepared pt [employer] and provided to pt daughter. Pt daughter to transport to facility. Faxed clinicals to facility and confirmed with Danielle Dess that pt is okay to dc. Pt nurse notified. CSW signing off.  [place], [place]

## 2014-04-27 NOTE — Discharge Summary (Signed)
Physician Discharge Summary  Barry Dean ESP:[id number] DOB: Jun 21, 1924 DOA: 04/24/2014  PCP: [name], MD  Admit date: 04/24/2014 Discharge date: 04/27/2014  Time spent: 40 minutes  Recommendations for Outpatient Follow-up:  1. Follow up with PCP in 1 week.  Discharge Diagnoses:  Principal Problem:   HCAP (healthcare-associated pneumonia) Active Problems:   Seizure disorder   HTN (hypertension)   Hyponatremia   Syncope   Discharge Condition: Stable  Diet recommendation: Heart healthy  Filed Weights   04/24/14 2344 04/26/14 0442 04/27/14 0444  Weight: 100.472 kg (221 lb 8 oz) 97.297 kg (214 lb 8 oz) 98.431 kg (217 lb)    History of present illness:  Barry Dean is a 80 y.o. male with Past medical history of hypertension, seizure, A. fib, chronic hyponatremia, GERD, seizure.  The patient presented with a fall and loss of consciousness. He mentions that he was using the bathroom after brushing his teeth and he felt dizzy and lightheaded and next thing he remembers is he was on the ground.  Prior to this and reported to the ER physician that she was walking in the building and sat on the ground.  Patient appears to be poor historian but denies any complaint of chest pain, shortness of breath, dizziness, lightheadedness, nausea, abdominal pain, palpitation, focal deficit, diarrhea or constipation at the time of my evaluation.  Initially it was reported that the patient is not acting himself by the daughter.  The patient is coming from SNF. And at his baseline dependent for most of his ADL.  Hospital Course:   1. Nausea/vomiting/abdominal pain -Unclear etiology, could be secondary to enteritis  -Symptoms resolving this morning.  -He was worked up with a CT scan of abdomen and pelvis with IV contrast which did not show an acute intra-abdominal pathology.  -KUB did reveal large hiatal hernia  -Tolerated clear liquids yesterday, she was eating a bagel for  breakfast this morning brought by his daughter. -Was supposed to eat his lunch in the hospital but daughter has discharged her so she can take him to eat out. -Denies any nausea or vomiting, today.  2. Question healthcare associated pneumonia  -Patient having a repeat chest x-ray this morning interpreted by radiology as having chronic bibasilar opacities.  -He does not have clinical signs and symptoms suggestive of pneumonia  -Was on antibiotics, discontinued because of lack of evidence of pneumonia.  3. Syncope. -Had 3 negative sets of troponins, with telemetry not showing changes  -Could be related to orthostasis from autonomic dysfunction in setting of Parkinson's disease  -Has not had further episodes of syncope during this hospitalization . -Initially his blood pressure medication was held, reduced at the time of discharge.  4. History of seizure disorder  -Does not appear that his syncopal event was secondary to seizures asthma symptoms have not witnessed seizure-like activity nor did there appear to be postictal confusion  -Continue Keppra 750 mg twice daily   5. Hypertension  -Continue labetalol 100 mg twice a day and clonidine 0.1 mg twice a day. -Home medications restarted at time of discharge.   Procedures:  None  Consultations:  None  Discharge Exam: Filed Vitals:   04/27/14 0928  BP: 111/53  Pulse:   Temp:   Resp:    General: Alert and awake, oriented x3, not in any acute distress. HEENT: anicteric sclera, pupils reactive to light and accommodation, EOMI CVS: S1-S2 clear, no murmur rubs or gallops Chest: clear to auscultation bilaterally, no wheezing, rales or  rhonchi Abdomen: soft nontender, nondistended, normal bowel sounds, no organomegaly Extremities: no cyanosis, clubbing or edema noted bilaterally Neuro: Cranial nerves II-XII intact, no focal neurological deficits  Discharge Instructions You were cared for by a hospitalist during your hospital stay.  If you have any questions about your discharge medications or the care you received while you were in the hospital after you are discharged, you can call the unit and asked to speak with the hospitalist on call if the hospitalist that took care of you is not available. Once you are discharged, your primary care physician will handle any further medical issues. Please note that NO REFILLS for any discharge medications will be authorized once you are discharged, as it is imperative that you return to your primary care physician (or establish a relationship with a primary care physician if you do not have one) for your aftercare needs so that they can reassess your need for medications and monitor your lab values.  Discharge Instructions   Diet - low sodium heart healthy    Complete by:  As directed      Increase activity slowly    Complete by:  As directed             Medication List         acetaminophen 500 MG tablet  Commonly known as:  TYLENOL  Take 500 mg by mouth every 6 (six) hours as needed for pain. Not to exceed 3 gms of acetaminophen in 24 hours     amLODipine-benazepril 10-40 MG per capsule  Commonly known as:  LOTREL  Take 1 capsule by mouth daily.     aspirin 81 MG chewable tablet  Chew 81 mg by mouth every evening.     cloNIDine 0.1 MG tablet  Commonly known as:  CATAPRES  Take 0.1 mg by mouth 2 (two) times daily.     ipratropium-albuterol 0.5-2.5 (3) MG/3ML Soln  Commonly known as:  DUONEB  Take 3 mLs by nebulization 2 (two) times daily.     iron polysaccharides 150 MG capsule  Commonly known as:  NIFEREX  Take 1 capsule (150 mg total) by mouth 2 (two) times daily.     labetalol 100 MG tablet  Commonly known as:  NORMODYNE  Take 100 mg by mouth 2 (two) times daily.     Lacosamide 150 MG Tabs  Commonly known as:  VIMPAT  Take 1 tablet (150 mg total) by mouth at bedtime.     levETIRAcetam 750 MG tablet  Commonly known as:  KEPPRA  Take 750 mg by mouth 2  (two) times daily.     LUBRIDERM ADVANCED THERAPY Lotn  Apply 1 application topically daily. Apply to feet after bathing every day     omeprazole 20 MG capsule  Commonly known as:  PRILOSEC  Take 20 mg by mouth daily.     polyethylene glycol packet  Commonly known as:  MIRALAX / GLYCOLAX  Take 17 g by mouth every other day.     sennosides-docusate sodium 8.6-50 MG tablet  Commonly known as:  SENOKOT-S  Take 1 tablet by mouth at bedtime as needed for constipation.       Allergies  Allergen Reactions  . Topamax Other (See Comments)    Unknown reaction  . Uroxatral [Alfuzosin Hydrochloride] Other (See Comments)    Unknown reaction  . Valium Other (See Comments)    Unknown reaction      The results of significant diagnostics from this hospitalization (including imaging,  microbiology, ancillary and laboratory) are listed below for reference.    Significant Diagnostic Studies: Dg Chest 2 View  04/26/2014   CLINICAL DATA:  Pneumonia, weakness  EXAM: CHEST  2 VIEW  COMPARISON:  Radiograph 04/24/2014, 10/02/2013, CT 1/18/ 2015  FINDINGS: Normal cardiac silhouette with large hiatal hernia. There is persistent reticular nodular pattern at the lung bases over multiple comparison chest x-rays and CT. Lungs are hyperinflated. Patient is kyphotic.  IMPRESSION: 1. No clear acute findings. 2. Bibasilar opacities are chronic. 3. Large hiatal hernia   Electronically Signed   By: Suzy Bouchard M.D.   On: 04/26/2014 07:50   Dg Chest 2 View  04/24/2014   CLINICAL DATA:  80 year old male with syncope and hypoxia.  EXAM: CHEST  2 VIEW  COMPARISON:  10/02/2013 and prior chest radiographs  FINDINGS: The cardiomediastinal silhouette is unremarkable.  New bibasilar opacities may represent atelectasis versus airspace disease/pneumonia.  A large hiatal hernia is again noted.  There is no evidence of pleural effusion, pneumothorax or pulmonary edema.  IMPRESSION: New bibasilar opacities -question  atelectasis versus airspace disease/ pneumonia.  Large hiatal hernia again noted.   Electronically Signed   By: Hassan Rowan M.D.   On: 04/24/2014 20:23   Dg Pelvis 1-2 Views  04/24/2014   CLINICAL DATA:  Fall and pelvic pain.  EXAM: PELVIS - 1-2 VIEW  COMPARISON:  None.  FINDINGS: There is no evidence of pelvic fracture or diastasis. No other pelvic bone lesions are seen.  IMPRESSION: Negative.   Electronically Signed   By: Hassan Rowan M.D.   On: 04/24/2014 20:23   Ct Head Wo Contrast  04/24/2014   CLINICAL DATA:  Syncopal episode while on the commode.  EXAM: CT HEAD WITHOUT CONTRAST  TECHNIQUE: Contiguous axial images were obtained from the base of the skull through the vertex without intravenous contrast.  COMPARISON:  CT head without contrast 09/13/2013  FINDINGS: Mild to moderate generalized atrophy and white matter disease is not significantly changed. No acute infarct, hemorrhage, or mass lesion is present. The ventricles are of normal size. No significant extraaxial fluid collection is present.  The paranasal sinuses and mastoid air cells are clear. The osseous skull is intact. No significant extraosseous soft tissue injury is evident.  IMPRESSION: 1. No acute intracranial abnormality or significant interval change. 2. Stable atrophy and white matter disease. 3. No evidence for acute trauma.   Electronically Signed   By: Lawrence Santiago M.D.   On: 04/24/2014 18:20   Ct Abdomen Pelvis W Contrast  04/25/2014   CLINICAL DATA:  repeated episodes of N/V and ABD pain despite IV antiemetic therapy  EXAM: CT ABDOMEN AND PELVIS WITH CONTRAST  TECHNIQUE: Multidetector CT imaging of the abdomen and pelvis was performed using the standard protocol following bolus administration of intravenous contrast.  CONTRAST:  154mL OMNIPAQUE IOHEXOL 300 MG/ML  SOLN  COMPARISON:  Chest 09/13/2013 and earlier studies  FINDINGS: Nasogastric tube loops in a large hiatal hernia which involves the fundus and a portion of the gastric  body. There are persistent patchy coarse airspace opacities in both lung bases left greater than right as before.  Unremarkable liver, gallbladder, spleen, adrenal glands, kidneys. Patchy aortoiliac calcifications without aneurysm or stenosis. Small bowel and colon are Unremarkable. Urinary bladder incompletely distended. Mild prostatic enlargement. No ascites. No free air. No adenopathy localized. No hydronephrosis. 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  IMPRESSION: 1. No acute abdominal process. 2. Nasogastric tube into large hiatal hernia.  Electronically Signed   By: Arne Cleveland M.D.   On: 04/25/2014 18:58   Dg Abd Portable 1v  04/25/2014   CLINICAL DATA:  NG tube adjustment  EXAM: PORTABLE ABDOMEN - 1 VIEW  COMPARISON:  04/25/2014 CT  FINDINGS: NG tube coils within the intrathoracic stomach /hiatal hernia. Bowel gas pattern nonspecific, nonobstructive. Atherosclerotic vascular calcifications. Curvature of the spine and multilevel degenerative changes. High density bladder in keeping with excreted contrast.  IMPRESSION: NG tube coils within the hiatal hernia.   Electronically Signed   By: Carlos Levering M.D.   On: 04/25/2014 22:15   Dg Abd Portable 1v  04/25/2014   CLINICAL DATA:  NG tube placement.  EXAM: PORTABLE ABDOMEN - 1 VIEW  COMPARISON:  CT 11/10/2012.  FINDINGS: Hiatal hernia is noted. NG tube is noted coiled in the hiatal hernia. No prominent bowel distention. Mild basilar atelectasis. Degenerative changes thoracolumbar spine.  IMPRESSION: NG tube noted coiled in hiatal hernia.   Electronically Signed   By: Marcello Moores  Register   On: 04/25/2014 17:58    Microbiology: Recent Results (from the past 240 hour(s))  URINE CULTURE     Status: None   Collection Time    04/24/14  7:11 PM      Result Value Ref Range Status   Specimen Description URINE, CLEAN CATCH   Final   Special Requests ADD [id number]   Final   Culture  Setup Time     Final   Value:  04/24/2014 21:06     Performed at [facility]     Final   Value: 30,000 COLONIES/ML     Performed at [facility]   Culture     Final   Value: Multiple bacterial morphotypes present, none predominant. Suggest appropriate recollection if clinically indicated.     Performed at [facility]   Report Status 04/26/2014 FINAL   Final  CULTURE, BLOOD (ROUTINE X 2)     Status: None   Collection Time    04/24/14  9:28 PM      Result Value Ref Range Status   Specimen Description BLOOD RIGHT HAND   Final   Special Requests BOTTLES DRAWN AEROBIC AND ANAEROBIC 5CC EA   Final   Culture  Setup Time     Final   Value: 04/25/2014 01:12     Performed at [facility]   Culture     Final   Value:        BLOOD CULTURE RECEIVED NO GROWTH TO DATE CULTURE WILL BE HELD FOR 5 DAYS BEFORE ISSUING A FINAL NEGATIVE REPORT     Performed at [facility]   Report Status PENDING   Incomplete  CULTURE, BLOOD (ROUTINE X 2)     Status: None   Collection Time    04/24/14  9:38 PM      Result Value Ref Range Status   Specimen Description BLOOD LEFT HAND   Final   Special Requests     Final   Value: BOTTLES DRAWN AEROBIC AND ANAEROBIC 5CC BLUE 4CC RED   Culture  Setup Time     Final   Value: 04/25/2014 01:13     Performed at [facility]   Culture     Final   Value:        BLOOD CULTURE RECEIVED NO GROWTH TO DATE CULTURE WILL BE HELD FOR 5 DAYS BEFORE ISSUING A FINAL NEGATIVE REPORT     Performed  at [facility]   Report Status PENDING   Incomplete  MRSA PCR SCREENING     Status: None   Collection Time    04/24/14 11:56 PM      Result Value Ref Range Status   MRSA by PCR NEGATIVE  NEGATIVE Final   Comment:            The GeneXpert MRSA Assay (FDA     approved for NASAL specimens     only), is one component of a     comprehensive MRSA colonization     surveillance program. It is not     intended to diagnose MRSA     infection nor to  guide or     monitor treatment for     MRSA infections.     Labs: Basic Metabolic Panel:  Recent Labs Lab 04/24/14 1800 04/24/14 2350 04/25/14 0705 04/25/14 1904 04/26/14 0525  NA 128* 129* 130* 131* 131*  K 4.6 4.0 4.3 4.4 4.0  CL 93* 92* 95* 91* 92*  CO2 24 24 22 27 25   GLUCOSE 102* 96 96 108* 89  BUN 14 11 10 13 13   CREATININE 0.93 0.87 0.79 0.99 0.95  CALCIUM 9.3 9.3 9.0 9.1 9.1   Liver Function Tests:  Recent Labs Lab 04/24/14 1800 04/25/14 0705 04/25/14 1904  AST 23 23 23   ALT 19 17 20   ALKPHOS 72 70 75  BILITOT 0.5 0.7 0.9  PROT 7.1 6.8 7.0  ALBUMIN 4.0 3.6 3.9   No results found for this basename: LIPASE, AMYLASE,  in the last 168 hours No results found for this basename: AMMONIA,  in the last 168 hours CBC:  Recent Labs Lab 04/24/14 1800 04/25/14 0705 04/25/14 1904 04/26/14 0525  WBC 5.0 5.5 7.1 6.6  NEUTROABS  --  2.9  --   --   HGB 14.3 14.3 15.2 15.2  HCT 41.0 40.8 41.9 43.4  MCV 88.9 90.1 87.8 90.6  PLT 243 214 207 212   Cardiac Enzymes:  Recent Labs Lab 04/25/14 1903 04/25/14 2320 04/26/14 0525  TROPONINI <0.30 <0.30 <0.30   BNP: BNP (last 3 results)  Recent Labs  09/13/13 1052  PROBNP 113.3   CBG: No results found for this basename: GLUCAP,  in the last 168 hours     Signed:  Avelyn Touch A  [facility] 04/27/2014, 1:34 PM

## 2014-04-27 NOTE — Clinical Social Work Psychosocial (Signed)
Clinical Social Work Department BRIEF PSYCHOSOCIAL ASSESSMENT 04/27/2014  Patient:  Barry Dean, Barry Dean     Account Number:  [id number]     Admit date:  04/24/2014  Clinical Social Worker:  Marciano Sequin  Date/Time:  04/27/2014 09:44 AM  Referred by:  RN  Date Referred:  04/27/2014 Referred for  ALF Placement   Other Referral:   Interview type:  Patient Other interview type:    PSYCHOSOCIAL DATA Living Status:  FACILITY Admitted from facility:   Level of care:  Assisted Living Primary support name:  Glorianne Manchester, [phone] Primary support relationship to patient:  CHILD, ADULT Degree of support available:   Good    CURRENT CONCERNS  Other Concerns:    SOCIAL WORK ASSESSMENT / PLAN CSW met pt at bedside. CSW introduce self and purpose of visit. Pt presented with Dean calm affect and mood. Pt reported that he will return back to his [place]. CSW explained the discharge process with pt. Pt reported that he did not have any imperative questions. CSW provided the pt with contact information for further questions.       Assessment/plan status:  Information/Referral to [employer] Other assessment/ plan:   Information/referral to community resources:   Return back to ALF    PATIENT'S/FAMILY'S RESPONSE TO PLAN OF CARE: Agreed    Greta Doom, MSW, [place]

## 2014-04-27 NOTE — Progress Notes (Signed)
Physical Therapy Treatment Patient Details Name: [name] MRN: [id number] DOB: 01/20/1924 Today's Date: 04/27/2014    History of Present Illness [name] is a 80 y.o. male with Past medical history of hypertension, seizure, A. fib, chronic hyponatremia, GERD, seizure. The patient presented with a fall and loss of consciousness. He mentions that he was using the bathroom after brushing his teeth and he felt dizzy and lightheaded and next thing he remembers is he was on the ground    PT Comments    Pt continues to be pleasant with flat affect and tolerating increased mobility. Pt with steady gait but requires cues throughout for RW use and posture. Assist for pericare after BM and cues for HEP. Will continue to follow to maximize independence and function.   Follow Up Recommendations  Home health PT;Supervision for mobility/OOB     Equipment Recommendations       Recommendations for Other Services       Precautions / Restrictions Precautions Precautions: Fall    Mobility  Bed Mobility               General bed mobility comments: IN recliner on arrival  Transfers Overall transfer level: Needs assistance   Transfers: Sit to/from Stand Sit to Stand: Min guard         General transfer comment: cues for scooting forward, anterior weight shift and safety from [facility] and recliner  Ambulation/Gait Ambulation/Gait assistance: Min guard Ambulation Distance (Feet): 300 Feet Assistive device: Rolling walker (2 wheeled) Gait Pattern/deviations: Trunk flexed;Shuffle;Narrow base of support   Gait velocity interpretation: Below normal speed for age/gender General Gait Details: cues for posture, looking up and position in RW, pt maintains forward head with neck flexion   Stairs            Wheelchair Mobility    Modified Rankin (Stroke Patients Only)       Balance                                    Cognition Arousal/Alertness:  Awake/alert Behavior During Therapy: Flat affect Overall Cognitive Status: Within Functional Limits for tasks assessed       Memory: Decreased short-term memory              Exercises General Exercises - Lower Extremity Long Arc Quad: AROM;Seated;Both;15 reps Hip Flexion/Marching: AROM;Seated;Both;20 reps    General Comments        Pertinent Vitals/Pain Pain Assessment: No/denies pain    Home Living                      Prior Function            PT Goals (current goals can now be found in the care plan section) Progress towards PT goals: Progressing toward goals    Frequency  Min 3X/week    PT Plan Current plan remains appropriate;Frequency needs to be updated    Co-evaluation             End of Session Equipment Utilized During Treatment: Gait belt Activity Tolerance: Patient tolerated treatment well Patient left: in chair;with call bell/phone within reach;with chair alarm set     Time: 9678-9381 PT Time Calculation (min): 24 min  Charges:  $Gait Training: 8-22 mins $Therapeutic Exercise: 8-22 mins  G CodesMelford Aase Apr 30, 2014, 12:15 PM Elwyn Reach, [place]

## 2014-04-29 DIAGNOSIS — R262 Difficulty in walking, not elsewhere classified: Secondary | ICD-10-CM | POA: Diagnosis not present

## 2014-04-29 DIAGNOSIS — R269 Unspecified abnormalities of gait and mobility: Secondary | ICD-10-CM | POA: Diagnosis not present

## 2014-04-30 ENCOUNTER — Encounter (HOSPITAL_COMMUNITY): Payer: Self-pay | Admitting: Emergency Medicine

## 2014-04-30 ENCOUNTER — Emergency Department (HOSPITAL_COMMUNITY): Payer: Medicare Other

## 2014-04-30 ENCOUNTER — Emergency Department (HOSPITAL_COMMUNITY)
Admission: EM | Admit: 2014-04-30 | Discharge: 2014-04-30 | Disposition: A | Discharge status: Home / Self Care | Payer: Medicare Other | Attending: Emergency Medicine | Admitting: Emergency Medicine

## 2014-04-30 DIAGNOSIS — I10 Essential (primary) hypertension: Secondary | ICD-10-CM | POA: Insufficient documentation

## 2014-04-30 DIAGNOSIS — Z87448 Personal history of other diseases of urinary system: Secondary | ICD-10-CM | POA: Insufficient documentation

## 2014-04-30 DIAGNOSIS — Z79899 Other long term (current) drug therapy: Secondary | ICD-10-CM | POA: Insufficient documentation

## 2014-04-30 DIAGNOSIS — S298XXA Other specified injuries of thorax, initial encounter: Secondary | ICD-10-CM | POA: Diagnosis not present

## 2014-04-30 DIAGNOSIS — S0190XA Unspecified open wound of unspecified part of head, initial encounter: Secondary | ICD-10-CM | POA: Diagnosis not present

## 2014-04-30 DIAGNOSIS — W19XXXA Unspecified fall, initial encounter: Secondary | ICD-10-CM | POA: Diagnosis not present

## 2014-04-30 DIAGNOSIS — T1490XA Injury, unspecified, initial encounter: Secondary | ICD-10-CM | POA: Diagnosis not present

## 2014-04-30 DIAGNOSIS — S0990XA Unspecified injury of head, initial encounter: Secondary | ICD-10-CM

## 2014-04-30 DIAGNOSIS — R51 Headache: Secondary | ICD-10-CM | POA: Diagnosis not present

## 2014-04-30 DIAGNOSIS — Z7982 Long term (current) use of aspirin: Secondary | ICD-10-CM | POA: Diagnosis not present

## 2014-04-30 DIAGNOSIS — Z85820 Personal history of malignant melanoma of skin: Secondary | ICD-10-CM | POA: Diagnosis not present

## 2014-04-30 DIAGNOSIS — Y9210 Unspecified residential institution as the place of occurrence of the external cause: Secondary | ICD-10-CM | POA: Diagnosis not present

## 2014-04-30 DIAGNOSIS — Z8669 Personal history of other diseases of the nervous system and sense organs: Secondary | ICD-10-CM | POA: Diagnosis not present

## 2014-04-30 DIAGNOSIS — S0083XA Contusion of other part of head, initial encounter: Secondary | ICD-10-CM | POA: Diagnosis not present

## 2014-04-30 DIAGNOSIS — IMO0002 Reserved for concepts with insufficient information to code with codable children: Secondary | ICD-10-CM | POA: Insufficient documentation

## 2014-04-30 DIAGNOSIS — M129 Arthropathy, unspecified: Secondary | ICD-10-CM | POA: Diagnosis not present

## 2014-04-30 DIAGNOSIS — Z8719 Personal history of other diseases of the digestive system: Secondary | ICD-10-CM | POA: Insufficient documentation

## 2014-04-30 DIAGNOSIS — H9190 Unspecified hearing loss, unspecified ear: Secondary | ICD-10-CM | POA: Diagnosis not present

## 2014-04-30 DIAGNOSIS — R269 Unspecified abnormalities of gait and mobility: Secondary | ICD-10-CM | POA: Diagnosis not present

## 2014-04-30 DIAGNOSIS — S0100XA Unspecified open wound of scalp, initial encounter: Secondary | ICD-10-CM | POA: Diagnosis not present

## 2014-04-30 DIAGNOSIS — Z8659 Personal history of other mental and behavioral disorders: Secondary | ICD-10-CM | POA: Insufficient documentation

## 2014-04-30 DIAGNOSIS — Y939 Activity, unspecified: Secondary | ICD-10-CM | POA: Insufficient documentation

## 2014-04-30 DIAGNOSIS — R262 Difficulty in walking, not elsewhere classified: Secondary | ICD-10-CM | POA: Diagnosis not present

## 2014-04-30 DIAGNOSIS — S0993XA Unspecified injury of face, initial encounter: Secondary | ICD-10-CM | POA: Diagnosis not present

## 2014-04-30 DIAGNOSIS — S1093XA Contusion of unspecified part of neck, initial encounter: Secondary | ICD-10-CM

## 2014-04-30 DIAGNOSIS — S0003XA Contusion of scalp, initial encounter: Secondary | ICD-10-CM | POA: Insufficient documentation

## 2014-04-30 DIAGNOSIS — S0081XA Abrasion of other part of head, initial encounter: Secondary | ICD-10-CM

## 2014-04-30 LAB — URINALYSIS, ROUTINE W REFLEX MICROSCOPIC
Glucose, UA: NEGATIVE mg/dL
Hgb urine dipstick: NEGATIVE
Ketones, ur: NEGATIVE mg/dL
Leukocytes, UA: NEGATIVE
Nitrite: NEGATIVE
Protein, ur: NEGATIVE mg/dL
Specific Gravity, Urine: 1.02 (ref 1.005–1.030)
Urobilinogen, UA: 0.2 mg/dL (ref 0.0–1.0)
pH: 5 (ref 5.0–8.0)

## 2014-04-30 NOTE — ED Notes (Signed)
Bed: TR32 Expected date:  Expected time:  Means of arrival:  Comments: Ems/80 yo male from snf with fall/lac to head

## 2014-04-30 NOTE — ED Notes (Signed)
Patient transported to CT 

## 2014-04-30 NOTE — ED Notes (Signed)
Pt placed on 3L O2, oxygen back up to 95%

## 2014-04-30 NOTE — ED Notes (Signed)
Notified RN, Lauren,[place] pt. Oxygen level decreased 88%. Pt. Placed on 2 liters oxygen per RN, [place].

## 2014-04-30 NOTE — ED Notes (Signed)
Per EMS, pt was A&Ox4. When RN went to assess pt. Pt was difficult to arouse and would not answer questions once awake.

## 2014-04-30 NOTE — ED Notes (Signed)
[facility] called  

## 2014-04-30 NOTE — Discharge Instructions (Signed)
Abrasion °An abrasion is a cut or scrape of the skin. Abrasions do not extend through all layers of the skin and most heal within 10 days. It is important to care for your abrasion properly to prevent infection. °CAUSES  °Most abrasions are caused by falling on, or gliding across, the ground or other surface. When your skin rubs on something, the outer and inner layer of skin rubs off, causing an abrasion. °DIAGNOSIS  °Your caregiver will be able to diagnose an abrasion during a physical exam.  °TREATMENT  °Your treatment depends on how large and deep the abrasion is. Generally, your abrasion will be cleaned with water and a mild soap to remove any dirt or debris. An antibiotic ointment may be put over the abrasion to prevent an infection. A bandage (dressing) may be wrapped around the abrasion to keep it from getting dirty.  °You may need a tetanus shot if: °· You cannot remember when you had your last tetanus shot. °· You have never had a tetanus shot. °· The injury broke your skin. °If you get a tetanus shot, your arm may swell, get red, and feel warm to the touch. This is common and not a problem. If you need a tetanus shot and you choose not to have one, there is a rare chance of getting tetanus. Sickness from tetanus can be serious.  °HOME CARE INSTRUCTIONS  °· If a dressing was applied, change it at least once a day or as directed by your caregiver. If the bandage sticks, soak it off with warm water.   °· Wash the area with water and a mild soap to remove all the ointment 2 times a day. Rinse off the soap and pat the area dry with a clean towel.   °· Reapply any ointment as directed by your caregiver. This will help prevent infection and keep the bandage from sticking. Use gauze over the wound and under the dressing to help keep the bandage from sticking.   °· Change your dressing right away if it becomes wet or dirty.   °· Only take over-the-counter or prescription medicines for pain, discomfort, or fever as  directed by your caregiver.   °· Follow up with your caregiver within 24-48 hours for a wound check, or as directed. If you were not given a wound-check appointment, look closely at your abrasion for redness, swelling, or pus. These are signs of infection. °SEEK IMMEDIATE MEDICAL CARE IF:  °· You have increasing pain in the wound.   °· You have redness, swelling, or tenderness around the wound.   °· You have pus coming from the wound.   °· You have a fever or persistent symptoms for more than 2-3 days. °· You have a fever and your symptoms suddenly get worse. °· You have a bad smell coming from the wound or dressing.   °MAKE SURE YOU:  °· Understand these instructions. °· Will watch your condition. °· Will get help right away if you are not doing well or get worse. °Document Released: 05/23/2005 Document Revised: 07/30/2012 Document Reviewed: 07/17/2011 °ExitCare® Patient Information ©2015 [name]. This information is not intended to replace advice given to you by your health care provider. Make sure you discuss any questions you have with your health care provider. ° °Head Injury °You have received a head injury. It does not appear serious at this time. Headaches and vomiting are common following head injury. It should be easy to awaken from sleeping. Sometimes it is necessary for you to stay in the emergency department for   a while for observation. Sometimes admission to the hospital may be needed. After injuries such as yours, most problems occur within the first 24 hours, but side effects may occur up to 7-10 days after the injury. It is important for you to carefully monitor your condition and contact your health care provider or seek immediate medical care if there is a change in your condition. °WHAT ARE THE TYPES OF HEAD INJURIES? °Head injuries can be as minor as a bump. Some head injuries can be more severe. More severe head injuries include: °· A jarring injury to the brain (concussion). °· A bruise  of the brain (contusion). This mean there is bleeding in the brain that can cause swelling. °· A cracked skull (skull fracture). °· Bleeding in the brain that collects, clots, and forms a bump (hematoma). °WHAT CAUSES A HEAD INJURY? °A serious head injury is most likely to happen to someone who is in a car wreck and is not wearing a seat belt. Other causes of major head injuries include bicycle or motorcycle accidents, sports injuries, and falls. °HOW ARE HEAD INJURIES DIAGNOSED? °A complete history of the event leading to the injury and your current symptoms will be helpful in diagnosing head injuries. Many times, pictures of the brain, such as CT or MRI are needed to see the extent of the injury. Often, an overnight hospital stay is necessary for observation.  °WHEN SHOULD I SEEK IMMEDIATE MEDICAL CARE?  °You should get help right away if: °· You have confusion or drowsiness. °· You feel sick to your stomach (nauseous) or have continued, forceful vomiting. °· You have dizziness or unsteadiness that is getting worse. °· You have severe, continued headaches not relieved by medicine. Only take over-the-counter or prescription medicines for pain, fever, or discomfort as directed by your health care provider. °· You do not have normal function of the arms or legs or are unable to walk. °· You notice changes in the black spots in the center of the colored part of your eye (pupil). °· You have a clear or bloody fluid coming from your nose or ears. °· You have a loss of vision. °During the next 24 hours after the injury, you must stay with someone who can watch you for the warning signs. This person should contact local emergency services (911 in the [place] if you have seizures, you become unconscious, or you are unable to wake up. °HOW CAN I PREVENT A HEAD INJURY IN THE FUTURE? °The most important factor for preventing major head injuries is avoiding motor vehicle accidents.  To minimize the potential for damage to your  head, it is crucial to wear seat belts while riding in motor vehicles. Wearing helmets while bike riding and playing collision sports (like football) is also helpful. Also, avoiding dangerous activities around the house will further help reduce your risk of head injury.  °WHEN CAN I RETURN TO NORMAL ACTIVITIES AND ATHLETICS? °You should be reevaluated by your health care provider before returning to these activities. If you have any of the following symptoms, you should not return to activities or contact sports until 1 week after the symptoms have stopped: °· Persistent headache. °· Dizziness or vertigo. °· Poor attention and concentration. °· Confusion. °· Memory problems. °· Nausea or vomiting. °· Fatigue or tire easily. °· Irritability. °· Intolerant of bright lights or loud noises. °· Anxiety or depression. °· Disturbed sleep. °MAKE SURE YOU:  °· Understand these instructions. °· Will watch your condition. °· Will get   help right away if you are not doing well or get worse. °Document Released: 08/13/2005 Document Revised: 08/18/2013 Document Reviewed: 04/20/2013 °ExitCare® Patient Information ©2015 [name]. This information is not intended to replace advice given to you by your health care provider. Make sure you discuss any questions you have with your health care provider. ° °

## 2014-04-30 NOTE — ED Notes (Signed)
Patient unable to sign at this time.

## 2014-04-30 NOTE — ED Notes (Signed)
Per EMS, Pt form assisted living facility, pt got up to use bathroom and fell. Pt unsure what caused him to fall. Denies LOC. Pt has half inch laceration to forehead with hematoma, bleeding controlled.   No other obvious injury. Pt has hx of vomiting, according to staff at nursing facility and there was vomit on floor when EMS go there. A&Ox4. Denise any pain.

## 2014-05-01 LAB — CULTURE, BLOOD (ROUTINE X 2)
Culture: NO GROWTH
Culture: NO GROWTH

## 2014-05-04 DIAGNOSIS — R269 Unspecified abnormalities of gait and mobility: Secondary | ICD-10-CM | POA: Diagnosis not present

## 2014-05-04 DIAGNOSIS — R262 Difficulty in walking, not elsewhere classified: Secondary | ICD-10-CM | POA: Diagnosis not present

## 2014-05-05 DIAGNOSIS — R262 Difficulty in walking, not elsewhere classified: Secondary | ICD-10-CM | POA: Diagnosis not present

## 2014-05-05 DIAGNOSIS — R269 Unspecified abnormalities of gait and mobility: Secondary | ICD-10-CM | POA: Diagnosis not present

## 2014-05-06 ENCOUNTER — Ambulatory Visit (INDEPENDENT_AMBULATORY_CARE_PROVIDER_SITE_OTHER): Payer: Medicare Other | Admitting: Cardiology

## 2014-05-06 ENCOUNTER — Encounter: Payer: Self-pay | Admitting: Cardiology

## 2014-05-06 VITALS — BP 110/64 | HR 60 | Ht 74.0 in | Wt 217.0 lb

## 2014-05-06 DIAGNOSIS — I10 Essential (primary) hypertension: Secondary | ICD-10-CM

## 2014-05-06 DIAGNOSIS — R262 Difficulty in walking, not elsewhere classified: Secondary | ICD-10-CM | POA: Diagnosis not present

## 2014-05-06 DIAGNOSIS — I059 Rheumatic mitral valve disease, unspecified: Secondary | ICD-10-CM

## 2014-05-06 DIAGNOSIS — R269 Unspecified abnormalities of gait and mobility: Secondary | ICD-10-CM | POA: Diagnosis not present

## 2014-05-06 NOTE — Progress Notes (Signed)
[facility]. [address] [place], [place]  [address] Phone: [phone] Fax:  [phone]  Date:  05/06/2014   ID:  Barry Dean, DOB [birth year], MRN [id number]  PCP:  [name], MD   History of Present Illness: Barry Dean is a 80 y.o. male with MVP posterior leaflet, moderate regurgitation anteriorly directed from echocardiogram in 2010, hypertension, depression former patient of Dr. Leonia Reeves here for followup.   Fall at [facility]. Hospital. [facility] again. PNA.   He has battled with depression. At [facility]. His daughter is here with him. Medications reviewed. No change in shortness of breath in regards to moderate mitral regurgitation. Murmur is unchanged. He is wearing support hose.    Wt Readings from Last 3 Encounters:  05/06/14 217 lb (98.431 kg)  04/27/14 217 lb (98.431 kg)  04/12/14 230 lb (104.327 kg)     Past Medical History  Diagnosis Date  . Hypertension   . Seizures   . A-fib   . Dehydration   . Deafness   . Anxiety   . Reflux   . Arthritis   . Depression   . Atrial fibrillation   . Tremor, essential   . Seizure   . BPH (benign prostatic hyperplasia)   . Syncope   . Deaf   . Melanoma   . GERD (gastroesophageal reflux disease)   . Mitral regurgitation   . Abnormality of gait     Past Surgical History  Procedure Laterality Date  . Unknown    . Appendectomy    . Cataract extraction w/ intraocular lens  implant, bilateral      Current Outpatient Prescriptions  Medication Sig Dispense Refill  . acetaminophen (TYLENOL) 500 MG tablet Take 500 mg by mouth every 6 (six) hours as needed for pain. Not to exceed 3 gms of acetaminophen in 24 hours      . amLODipine-benazepril (LOTREL) 10-40 MG per capsule Take 1 capsule by mouth daily.       Marland Kitchen aspirin 81 MG chewable tablet Chew 81 mg by mouth every evening.       Marland Kitchen Emollient (LUBRIDERM ADVANCED THERAPY) LOTN Apply 1 application topically daily. Apply to feet after  bathing every day      . labetalol (NORMODYNE) 100 MG tablet Take 100 mg by mouth 2 (two) times daily.       . Lacosamide (VIMPAT) 150 MG TABS Take 1 tablet (150 mg total) by mouth at bedtime.  60 tablet  5  . omeprazole (PRILOSEC) 20 MG capsule Take 20 mg by mouth daily.      . polyethylene glycol (MIRALAX / GLYCOLAX) packet Take 17 g by mouth every other day.       . sennosides-docusate sodium (SENOKOT-S) 8.6-50 MG tablet Take 1 tablet by mouth at bedtime as needed for constipation.       No current facility-administered medications for this visit.    Allergies:    Allergies  Allergen Reactions  . Topamax Other (See Comments)    Unknown reaction  . Uroxatral [Alfuzosin Hydrochloride] Other (See Comments)    Unknown reaction  . Valium Other (See Comments)    Unknown reaction    Social History:  The patient  reports that he has never smoked. He does not have any smokeless tobacco history on file. He reports that he does not drink alcohol or use illicit drugs.   Family History  Problem Relation Age of Onset  . Cancer Mother  Skin cancer  . Stroke Father   . Parkinsonism Sister   . Diabetes Brother     ROS:  Please see the history of present illness.   Denies any fevers, chills, orthopnea, PND   All other systems reviewed and negative.   PHYSICAL EXAM: VS:  BP 110/64  Pulse 60  Ht 6\' 2"  (1.88 m)  Wt 217 lb (98.431 kg)  BMI 27.85 kg/m2 Elderly in no acute distress HEENT: normal, head wound, EOMI Neck: no JVD, normal carotid upstroke, no bruit Cardiac:  normal S1, S2; RRR; 1/6 apical systolic murmur Lungs:  clear to auscultation bilaterally, no wheezing, rhonchi or rales Abd: soft, nontender, no hepatomegaly, no bruits Ext: no edema, 2+ distal pulses Skin: warm and dry GU: deferred Neuro: no focal abnormalities noted, AAO x 3  EKG:  None     ASSESSMENT AND PLAN:  1. Moderate mitral regurgitation-no change in symptoms. Doing well. No change in symptoms. He would  not be a surgical candidate. I am fine with seeing him on an as-needed basis. He has been stable. I will be happy to assist Dr. Felipa Eth if necessary in the future. 2. Essential hypertension-excellent control. 3. When necessary followup.  Signed, Candee Furbish, MD [facility]  05/06/2014 3:07 PM

## 2014-05-06 NOTE — Patient Instructions (Signed)
The current medical regimen is effective;  continue present plan and medications.  Follow up as needed 

## 2014-05-07 DIAGNOSIS — R269 Unspecified abnormalities of gait and mobility: Secondary | ICD-10-CM | POA: Diagnosis not present

## 2014-05-07 DIAGNOSIS — R262 Difficulty in walking, not elsewhere classified: Secondary | ICD-10-CM | POA: Diagnosis not present

## 2014-05-09 NOTE — ED Provider Notes (Signed)
CSN: [id number]     Arrival date & time 04/30/14  0245 History   First MD Initiated Contact with Patient 04/30/14 0308     Chief Complaint  Patient presents with  . Fall  . Head Laceration     (Consider location/radiation/quality/duration/timing/severity/associated sxs/prior Treatment) HPI  80 year old male sent from assisted living for evaluation after fall. Instead happened shortly before arrival. Patient states he is getting up to use the bathroom and he fell. He cannot tell me if he tripped or what specifically happened. He reports that he did not lose consciousness. He denies any preceding symptoms such as dizziness or lightheadedness. He did strike his head. He is complaining of some pain in his left 4 head. He denies any significant pain anywhere else. No acute visual complaints. Denies nausea. No blood thinners aside from aspirin per meds list.  Past Medical History  Diagnosis Date  . Hypertension   . Seizures   . A-fib   . Dehydration   . Deafness   . Anxiety   . Reflux   . Arthritis   . Depression   . Atrial fibrillation   . Tremor, essential   . Seizure   . BPH (benign prostatic hyperplasia)   . Syncope   . Deaf   . Melanoma   . GERD (gastroesophageal reflux disease)   . Mitral regurgitation   . Abnormality of gait    Past Surgical History  Procedure Laterality Date  . Unknown    . Appendectomy    . Cataract extraction w/ intraocular lens  implant, bilateral     Family History  Problem Relation Age of Onset  . Cancer Mother     Skin cancer  . Stroke Father   . Parkinsonism Sister   . Diabetes Brother    History  Substance Use Topics  . Smoking status: Never Smoker   . Smokeless tobacco: Not on file  . Alcohol Use: No    Review of Systems  All systems reviewed and negative, other than as noted in HPI.   Allergies  Topamax; Uroxatral; and Valium  Home Medications   Prior to Admission medications   Medication Sig Start Date End Date Taking?  Authorizing Provider  acetaminophen (TYLENOL) 500 MG tablet Take 500 mg by mouth every 6 (six) hours as needed for pain. Not to exceed 3 gms of acetaminophen in 24 hours   Yes Historical Provider, MD  amLODipine-benazepril (LOTREL) 10-40 MG per capsule Take 1 capsule by mouth daily.    Yes Historical Provider, MD  aspirin 81 MG chewable tablet Chew 81 mg by mouth every evening.    Yes Historical Provider, MD  Emollient Jilda Panda ADVANCED THERAPY) LOTN Apply 1 application topically daily. Apply to feet after bathing every day   Yes Historical Provider, MD  labetalol (NORMODYNE) 100 MG tablet Take 100 mg by mouth 2 (two) times daily.    Yes Historical Provider, MD  Lacosamide (VIMPAT) 150 MG TABS Take 1 tablet (150 mg total) by mouth at bedtime. 09/02/13  Yes Kathrynn Ducking, MD  omeprazole (PRILOSEC) 20 MG capsule Take 20 mg by mouth daily.   Yes Historical Provider, MD  polyethylene glycol (MIRALAX / GLYCOLAX) packet Take 17 g by mouth every other day.    Yes Historical Provider, MD  sennosides-docusate sodium (SENOKOT-S) 8.6-50 MG tablet Take 1 tablet by mouth at bedtime as needed for constipation.   Yes Historical Provider, MD   BP 159/62  Pulse 83  Temp(Src) 98.3 F (36.8 C) (  Oral)  Resp 20  SpO2 92% Physical Exam  Nursing note and vitals reviewed. Constitutional: He appears well-developed and well-nourished. No distress.  HENT:  Head:    Hematoma left 4 head and the depicted area with overlying abrasion. Very small area does extend down through the dermis. This is less than 0.5 cm & non-gaping  Eyes: Conjunctivae are normal. Right eye exhibits no discharge. Left eye exhibits no discharge.  Neck: Neck supple.  Cardiovascular: Normal rate, regular rhythm and normal heart sounds.  Exam reveals no gallop and no friction rub.   No murmur heard. Pulmonary/Chest: Effort normal and breath sounds normal. No respiratory distress.  Abdominal: Soft. He exhibits no distension. There is no  tenderness.  Musculoskeletal: He exhibits no edema and no tenderness.  No midline spinal tenderness. No bony tenderness of the extremities or apparent pain with range of motion of the large joints.  Neurological:  A somewhat hard of hearing. Cn intact otherwise. Follows commands.  Strength is 5 bilateral upper lower extremities.  Skin: Skin is warm and dry.  Psychiatric: He has a normal mood and affect. His behavior is normal. Thought content normal.    ED Course  Procedures (including critical care time) Labs Review Labs Reviewed  URINALYSIS, ROUTINE W REFLEX MICROSCOPIC - Abnormal; Notable for the following:    Color, Urine AMBER (*)    Bilirubin Urine SMALL (*)    All other components within normal limits    Imaging Review No results found.   EKG Interpretation   Date/Time:  Friday April 30 2014 03:40:41 EDT Ventricular Rate:  64 PR Interval:  207 QRS Duration: 97 QT Interval:  409 QTC Calculation: 422 R Axis:   70 Text Interpretation:  Sinus rhythm ED PHYSICIAN INTERPRETATION AVAILABLE  IN [facility] Confirmed by TEST, Record ([id number]) on 05/02/2014 8:27:32  AM      MDM   Final diagnoses:  Closed head injury, initial encounter  Forehead abrasion, initial encounter   80 year old male presenting after fall. Lost his balance going to the bathroom. Appears to be at his reported baseline mental status. Imaging does not show any acute emergent findings. Small abrasion and hematoma to left 4 head. Localized wound care.    Virgel Manifold, MD 05/09/14 [phone]

## 2014-05-10 DIAGNOSIS — R262 Difficulty in walking, not elsewhere classified: Secondary | ICD-10-CM | POA: Diagnosis not present

## 2014-05-10 DIAGNOSIS — R269 Unspecified abnormalities of gait and mobility: Secondary | ICD-10-CM | POA: Diagnosis not present

## 2014-05-12 DIAGNOSIS — R262 Difficulty in walking, not elsewhere classified: Secondary | ICD-10-CM | POA: Diagnosis not present

## 2014-05-12 DIAGNOSIS — R269 Unspecified abnormalities of gait and mobility: Secondary | ICD-10-CM | POA: Diagnosis not present

## 2014-05-13 DIAGNOSIS — R269 Unspecified abnormalities of gait and mobility: Secondary | ICD-10-CM | POA: Diagnosis not present

## 2014-05-13 DIAGNOSIS — R262 Difficulty in walking, not elsewhere classified: Secondary | ICD-10-CM | POA: Diagnosis not present

## 2014-05-14 DIAGNOSIS — R269 Unspecified abnormalities of gait and mobility: Secondary | ICD-10-CM | POA: Diagnosis not present

## 2014-05-14 DIAGNOSIS — R262 Difficulty in walking, not elsewhere classified: Secondary | ICD-10-CM | POA: Diagnosis not present

## 2014-05-18 DIAGNOSIS — R269 Unspecified abnormalities of gait and mobility: Secondary | ICD-10-CM | POA: Diagnosis not present

## 2014-05-18 DIAGNOSIS — R262 Difficulty in walking, not elsewhere classified: Secondary | ICD-10-CM | POA: Diagnosis not present

## 2014-05-19 DIAGNOSIS — R262 Difficulty in walking, not elsewhere classified: Secondary | ICD-10-CM | POA: Diagnosis not present

## 2014-05-19 DIAGNOSIS — R269 Unspecified abnormalities of gait and mobility: Secondary | ICD-10-CM | POA: Diagnosis not present

## 2014-05-20 ENCOUNTER — Encounter: Payer: Self-pay | Admitting: Cardiology

## 2014-05-20 DIAGNOSIS — R262 Difficulty in walking, not elsewhere classified: Secondary | ICD-10-CM | POA: Diagnosis not present

## 2014-05-20 DIAGNOSIS — R269 Unspecified abnormalities of gait and mobility: Secondary | ICD-10-CM | POA: Diagnosis not present

## 2014-05-21 DIAGNOSIS — R262 Difficulty in walking, not elsewhere classified: Secondary | ICD-10-CM | POA: Diagnosis not present

## 2014-05-21 DIAGNOSIS — R269 Unspecified abnormalities of gait and mobility: Secondary | ICD-10-CM | POA: Diagnosis not present

## 2014-05-24 DIAGNOSIS — R269 Unspecified abnormalities of gait and mobility: Secondary | ICD-10-CM | POA: Diagnosis not present

## 2014-05-24 DIAGNOSIS — R262 Difficulty in walking, not elsewhere classified: Secondary | ICD-10-CM | POA: Diagnosis not present

## 2014-05-25 DIAGNOSIS — R269 Unspecified abnormalities of gait and mobility: Secondary | ICD-10-CM | POA: Diagnosis not present

## 2014-05-25 DIAGNOSIS — R262 Difficulty in walking, not elsewhere classified: Secondary | ICD-10-CM | POA: Diagnosis not present

## 2014-05-26 DIAGNOSIS — R262 Difficulty in walking, not elsewhere classified: Secondary | ICD-10-CM | POA: Diagnosis not present

## 2014-05-26 DIAGNOSIS — R269 Unspecified abnormalities of gait and mobility: Secondary | ICD-10-CM | POA: Diagnosis not present

## 2014-05-27 DIAGNOSIS — R269 Unspecified abnormalities of gait and mobility: Secondary | ICD-10-CM | POA: Diagnosis not present

## 2014-05-27 DIAGNOSIS — Z23 Encounter for immunization: Secondary | ICD-10-CM | POA: Diagnosis not present

## 2014-05-27 DIAGNOSIS — R262 Difficulty in walking, not elsewhere classified: Secondary | ICD-10-CM | POA: Diagnosis not present

## 2014-05-27 DIAGNOSIS — R2689 Other abnormalities of gait and mobility: Secondary | ICD-10-CM | POA: Diagnosis not present

## 2014-05-31 DIAGNOSIS — R2689 Other abnormalities of gait and mobility: Secondary | ICD-10-CM | POA: Diagnosis not present

## 2014-05-31 DIAGNOSIS — R269 Unspecified abnormalities of gait and mobility: Secondary | ICD-10-CM | POA: Diagnosis not present

## 2014-05-31 DIAGNOSIS — R262 Difficulty in walking, not elsewhere classified: Secondary | ICD-10-CM | POA: Diagnosis not present

## 2014-06-01 DIAGNOSIS — R2689 Other abnormalities of gait and mobility: Secondary | ICD-10-CM | POA: Diagnosis not present

## 2014-06-01 DIAGNOSIS — R269 Unspecified abnormalities of gait and mobility: Secondary | ICD-10-CM | POA: Diagnosis not present

## 2014-06-01 DIAGNOSIS — R262 Difficulty in walking, not elsewhere classified: Secondary | ICD-10-CM | POA: Diagnosis not present

## 2014-06-02 DIAGNOSIS — R269 Unspecified abnormalities of gait and mobility: Secondary | ICD-10-CM | POA: Diagnosis not present

## 2014-06-02 DIAGNOSIS — R2689 Other abnormalities of gait and mobility: Secondary | ICD-10-CM | POA: Diagnosis not present

## 2014-06-02 DIAGNOSIS — R262 Difficulty in walking, not elsewhere classified: Secondary | ICD-10-CM | POA: Diagnosis not present

## 2014-06-03 DIAGNOSIS — J189 Pneumonia, unspecified organism: Secondary | ICD-10-CM | POA: Diagnosis not present

## 2014-06-03 DIAGNOSIS — I340 Nonrheumatic mitral (valve) insufficiency: Secondary | ICD-10-CM | POA: Diagnosis not present

## 2014-06-03 DIAGNOSIS — I10 Essential (primary) hypertension: Secondary | ICD-10-CM | POA: Diagnosis not present

## 2014-06-03 DIAGNOSIS — Z23 Encounter for immunization: Secondary | ICD-10-CM | POA: Diagnosis not present

## 2014-06-03 DIAGNOSIS — G40909 Epilepsy, unspecified, not intractable, without status epilepticus: Secondary | ICD-10-CM | POA: Diagnosis not present

## 2014-06-03 DIAGNOSIS — R55 Syncope and collapse: Secondary | ICD-10-CM | POA: Diagnosis not present

## 2014-06-03 DIAGNOSIS — R269 Unspecified abnormalities of gait and mobility: Secondary | ICD-10-CM | POA: Diagnosis not present

## 2014-06-03 DIAGNOSIS — R2689 Other abnormalities of gait and mobility: Secondary | ICD-10-CM | POA: Diagnosis not present

## 2014-06-03 DIAGNOSIS — R262 Difficulty in walking, not elsewhere classified: Secondary | ICD-10-CM | POA: Diagnosis not present

## 2014-06-03 DIAGNOSIS — I480 Paroxysmal atrial fibrillation: Secondary | ICD-10-CM | POA: Diagnosis not present

## 2014-06-03 DIAGNOSIS — E222 Syndrome of inappropriate secretion of antidiuretic hormone: Secondary | ICD-10-CM | POA: Diagnosis not present

## 2014-06-04 DIAGNOSIS — R2689 Other abnormalities of gait and mobility: Secondary | ICD-10-CM | POA: Diagnosis not present

## 2014-06-04 DIAGNOSIS — R269 Unspecified abnormalities of gait and mobility: Secondary | ICD-10-CM | POA: Diagnosis not present

## 2014-06-04 DIAGNOSIS — R262 Difficulty in walking, not elsewhere classified: Secondary | ICD-10-CM | POA: Diagnosis not present

## 2014-06-08 DIAGNOSIS — R262 Difficulty in walking, not elsewhere classified: Secondary | ICD-10-CM | POA: Diagnosis not present

## 2014-06-08 DIAGNOSIS — R269 Unspecified abnormalities of gait and mobility: Secondary | ICD-10-CM | POA: Diagnosis not present

## 2014-06-08 DIAGNOSIS — R2689 Other abnormalities of gait and mobility: Secondary | ICD-10-CM | POA: Diagnosis not present

## 2014-06-09 DIAGNOSIS — R269 Unspecified abnormalities of gait and mobility: Secondary | ICD-10-CM | POA: Diagnosis not present

## 2014-06-09 DIAGNOSIS — R2689 Other abnormalities of gait and mobility: Secondary | ICD-10-CM | POA: Diagnosis not present

## 2014-06-09 DIAGNOSIS — R262 Difficulty in walking, not elsewhere classified: Secondary | ICD-10-CM | POA: Diagnosis not present

## 2014-06-10 DIAGNOSIS — R269 Unspecified abnormalities of gait and mobility: Secondary | ICD-10-CM | POA: Diagnosis not present

## 2014-06-10 DIAGNOSIS — R262 Difficulty in walking, not elsewhere classified: Secondary | ICD-10-CM | POA: Diagnosis not present

## 2014-06-10 DIAGNOSIS — R2689 Other abnormalities of gait and mobility: Secondary | ICD-10-CM | POA: Diagnosis not present

## 2014-06-11 DIAGNOSIS — R262 Difficulty in walking, not elsewhere classified: Secondary | ICD-10-CM | POA: Diagnosis not present

## 2014-06-11 DIAGNOSIS — R269 Unspecified abnormalities of gait and mobility: Secondary | ICD-10-CM | POA: Diagnosis not present

## 2014-06-11 DIAGNOSIS — R2689 Other abnormalities of gait and mobility: Secondary | ICD-10-CM | POA: Diagnosis not present

## 2014-06-14 DIAGNOSIS — R2689 Other abnormalities of gait and mobility: Secondary | ICD-10-CM | POA: Diagnosis not present

## 2014-06-14 DIAGNOSIS — R269 Unspecified abnormalities of gait and mobility: Secondary | ICD-10-CM | POA: Diagnosis not present

## 2014-06-14 DIAGNOSIS — R262 Difficulty in walking, not elsewhere classified: Secondary | ICD-10-CM | POA: Diagnosis not present

## 2014-06-15 DIAGNOSIS — R2689 Other abnormalities of gait and mobility: Secondary | ICD-10-CM | POA: Diagnosis not present

## 2014-06-15 DIAGNOSIS — R262 Difficulty in walking, not elsewhere classified: Secondary | ICD-10-CM | POA: Diagnosis not present

## 2014-06-15 DIAGNOSIS — R269 Unspecified abnormalities of gait and mobility: Secondary | ICD-10-CM | POA: Diagnosis not present

## 2014-06-16 DIAGNOSIS — R2689 Other abnormalities of gait and mobility: Secondary | ICD-10-CM | POA: Diagnosis not present

## 2014-06-16 DIAGNOSIS — R269 Unspecified abnormalities of gait and mobility: Secondary | ICD-10-CM | POA: Diagnosis not present

## 2014-06-16 DIAGNOSIS — R262 Difficulty in walking, not elsewhere classified: Secondary | ICD-10-CM | POA: Diagnosis not present

## 2014-06-17 DIAGNOSIS — R262 Difficulty in walking, not elsewhere classified: Secondary | ICD-10-CM | POA: Diagnosis not present

## 2014-06-17 DIAGNOSIS — R2689 Other abnormalities of gait and mobility: Secondary | ICD-10-CM | POA: Diagnosis not present

## 2014-06-17 DIAGNOSIS — R269 Unspecified abnormalities of gait and mobility: Secondary | ICD-10-CM | POA: Diagnosis not present

## 2014-06-18 DIAGNOSIS — R269 Unspecified abnormalities of gait and mobility: Secondary | ICD-10-CM | POA: Diagnosis not present

## 2014-06-18 DIAGNOSIS — R2689 Other abnormalities of gait and mobility: Secondary | ICD-10-CM | POA: Diagnosis not present

## 2014-06-18 DIAGNOSIS — R262 Difficulty in walking, not elsewhere classified: Secondary | ICD-10-CM | POA: Diagnosis not present

## 2014-06-21 DIAGNOSIS — R262 Difficulty in walking, not elsewhere classified: Secondary | ICD-10-CM | POA: Diagnosis not present

## 2014-06-21 DIAGNOSIS — R269 Unspecified abnormalities of gait and mobility: Secondary | ICD-10-CM | POA: Diagnosis not present

## 2014-06-21 DIAGNOSIS — R2689 Other abnormalities of gait and mobility: Secondary | ICD-10-CM | POA: Diagnosis not present

## 2014-06-22 DIAGNOSIS — R269 Unspecified abnormalities of gait and mobility: Secondary | ICD-10-CM | POA: Diagnosis not present

## 2014-06-22 DIAGNOSIS — R2689 Other abnormalities of gait and mobility: Secondary | ICD-10-CM | POA: Diagnosis not present

## 2014-06-22 DIAGNOSIS — R262 Difficulty in walking, not elsewhere classified: Secondary | ICD-10-CM | POA: Diagnosis not present

## 2014-06-24 DIAGNOSIS — R262 Difficulty in walking, not elsewhere classified: Secondary | ICD-10-CM | POA: Diagnosis not present

## 2014-06-24 DIAGNOSIS — R269 Unspecified abnormalities of gait and mobility: Secondary | ICD-10-CM | POA: Diagnosis not present

## 2014-06-24 DIAGNOSIS — R2689 Other abnormalities of gait and mobility: Secondary | ICD-10-CM | POA: Diagnosis not present

## 2014-06-25 DIAGNOSIS — R262 Difficulty in walking, not elsewhere classified: Secondary | ICD-10-CM | POA: Diagnosis not present

## 2014-06-25 DIAGNOSIS — R269 Unspecified abnormalities of gait and mobility: Secondary | ICD-10-CM | POA: Diagnosis not present

## 2014-06-25 DIAGNOSIS — R2689 Other abnormalities of gait and mobility: Secondary | ICD-10-CM | POA: Diagnosis not present

## 2014-06-28 DIAGNOSIS — R262 Difficulty in walking, not elsewhere classified: Secondary | ICD-10-CM | POA: Diagnosis not present

## 2014-06-28 DIAGNOSIS — R269 Unspecified abnormalities of gait and mobility: Secondary | ICD-10-CM | POA: Diagnosis not present

## 2014-06-28 DIAGNOSIS — R2689 Other abnormalities of gait and mobility: Secondary | ICD-10-CM | POA: Diagnosis not present

## 2014-06-29 DIAGNOSIS — R2689 Other abnormalities of gait and mobility: Secondary | ICD-10-CM | POA: Diagnosis not present

## 2014-06-29 DIAGNOSIS — R269 Unspecified abnormalities of gait and mobility: Secondary | ICD-10-CM | POA: Diagnosis not present

## 2014-06-29 DIAGNOSIS — R262 Difficulty in walking, not elsewhere classified: Secondary | ICD-10-CM | POA: Diagnosis not present

## 2014-06-30 DIAGNOSIS — R2689 Other abnormalities of gait and mobility: Secondary | ICD-10-CM | POA: Diagnosis not present

## 2014-06-30 DIAGNOSIS — R262 Difficulty in walking, not elsewhere classified: Secondary | ICD-10-CM | POA: Diagnosis not present

## 2014-06-30 DIAGNOSIS — R269 Unspecified abnormalities of gait and mobility: Secondary | ICD-10-CM | POA: Diagnosis not present

## 2014-07-01 DIAGNOSIS — R2689 Other abnormalities of gait and mobility: Secondary | ICD-10-CM | POA: Diagnosis not present

## 2014-07-01 DIAGNOSIS — R269 Unspecified abnormalities of gait and mobility: Secondary | ICD-10-CM | POA: Diagnosis not present

## 2014-07-01 DIAGNOSIS — R262 Difficulty in walking, not elsewhere classified: Secondary | ICD-10-CM | POA: Diagnosis not present

## 2014-07-06 DIAGNOSIS — R2689 Other abnormalities of gait and mobility: Secondary | ICD-10-CM | POA: Diagnosis not present

## 2014-07-06 DIAGNOSIS — R262 Difficulty in walking, not elsewhere classified: Secondary | ICD-10-CM | POA: Diagnosis not present

## 2014-07-06 DIAGNOSIS — R269 Unspecified abnormalities of gait and mobility: Secondary | ICD-10-CM | POA: Diagnosis not present

## 2014-07-07 DIAGNOSIS — R2689 Other abnormalities of gait and mobility: Secondary | ICD-10-CM | POA: Diagnosis not present

## 2014-07-07 DIAGNOSIS — R269 Unspecified abnormalities of gait and mobility: Secondary | ICD-10-CM | POA: Diagnosis not present

## 2014-07-07 DIAGNOSIS — R262 Difficulty in walking, not elsewhere classified: Secondary | ICD-10-CM | POA: Diagnosis not present

## 2014-07-09 DIAGNOSIS — R2689 Other abnormalities of gait and mobility: Secondary | ICD-10-CM | POA: Diagnosis not present

## 2014-07-09 DIAGNOSIS — R269 Unspecified abnormalities of gait and mobility: Secondary | ICD-10-CM | POA: Diagnosis not present

## 2014-07-09 DIAGNOSIS — R262 Difficulty in walking, not elsewhere classified: Secondary | ICD-10-CM | POA: Diagnosis not present

## 2014-07-13 ENCOUNTER — Ambulatory Visit (INDEPENDENT_AMBULATORY_CARE_PROVIDER_SITE_OTHER): Payer: Medicare Other | Admitting: Adult Health

## 2014-07-13 ENCOUNTER — Encounter: Payer: Self-pay | Admitting: Adult Health

## 2014-07-13 VITALS — BP 129/63 | HR 65 | Temp 98.1°F | Resp 16 | Ht 75.0 in | Wt 219.4 lb

## 2014-07-13 DIAGNOSIS — G40909 Epilepsy, unspecified, not intractable, without status epilepticus: Secondary | ICD-10-CM

## 2014-07-13 DIAGNOSIS — R262 Difficulty in walking, not elsewhere classified: Secondary | ICD-10-CM | POA: Diagnosis not present

## 2014-07-13 DIAGNOSIS — R2689 Other abnormalities of gait and mobility: Secondary | ICD-10-CM | POA: Diagnosis not present

## 2014-07-13 DIAGNOSIS — R269 Unspecified abnormalities of gait and mobility: Secondary | ICD-10-CM | POA: Diagnosis not present

## 2014-07-13 MED ORDER — LACOSAMIDE 200 MG PO TABS: 200.0000 mg | ORAL_TABLET | Freq: Every day | ORAL | Status: DC

## 2014-07-13 NOTE — Progress Notes (Signed)
I have read the note, and I agree with the clinical assessment and plan.  Barry Dean KEITH   

## 2014-07-13 NOTE — Progress Notes (Signed)
PATIENT: Barry Dean DOB: 06-12-1924  REASON FOR VISIT: follow up HISTORY FROM: patient  HISTORY OF PRESENT ILLNESS: Barry Dean is a 80 year old male with a history of seizures. He returns today for follow-up. He is currently taking keppra and vimpat and tolerating those well. He denies any recent seizures. Although his daughter states he has been having episodes where he sounds like he is giggling or crying but does not appear to be laughing or crying. He will stare out when he is doing it. It will last just seconds. This started about 6 months ago and usually occurs at least 1 a week.   He currently lives at [facility]. He requires assistance with ADLs. In September he went into the hospital for pneumonia. He went back to the hospital several days later for a fall but did not sustain any injuries. He was then referred for physical therapy at [facility].  HISTORY 07/09/13 (Barry Dean): Barry Dean is an 80 year old left-handed white male with a history of a seizure disorder. The patient has done quite well on Keppra and Vimpat. The patient currently is residing in [name], and he has done relatively well. The patient had one fall in May of 2013, when he fell backwards. The patient did not sustain significant injury. The patient was in the hospital in March of 2014 with a pneumonia and colitis. The patient is tolerating the seizure medications well. Again, the patient's family does not report any seizures since he was seen last in February 2013. The patient remains relatively active, walking with a walker  REVIEW OF SYSTEMS: Out of a complete 14 system review of symptoms, the patient complains only of the following symptoms, and all other reviewed systems are negative.  Hearing loss, drooling Black stools, diarrhea Walking difficulty Passing out Confusion   ALLERGIES: Allergies  Allergen Reactions  . Topamax Other (See Comments)    Unknown reaction  . Uroxatral  [Alfuzosin Hydrochloride] Other (See Comments)    Unknown reaction  . Valium Other (See Comments)    Unknown reaction    HOME MEDICATIONS: Outpatient Prescriptions Prior to Visit  Medication Sig Dispense Refill  . acetaminophen (TYLENOL) 500 MG tablet Take 500 mg by mouth every 6 (six) hours as needed for pain. Not to exceed 3 gms of acetaminophen in 24 hours    . amLODipine-benazepril (LOTREL) 10-40 MG per capsule Take 1 capsule by mouth daily.     Marland Kitchen aspirin 81 MG chewable tablet Chew 81 mg by mouth every evening.     Marland Kitchen Emollient (LUBRIDERM ADVANCED THERAPY) LOTN Apply 1 application topically daily. Apply to feet after bathing every day    . labetalol (NORMODYNE) 100 MG tablet Take 100 mg by mouth 2 (two) times daily.     . Lacosamide (VIMPAT) 150 MG TABS Take 1 tablet (150 mg total) by mouth at bedtime. 60 tablet 5  . omeprazole (PRILOSEC) 20 MG capsule Take 20 mg by mouth daily.    . polyethylene glycol (MIRALAX / GLYCOLAX) packet Take 17 g by mouth every other day.     . sennosides-docusate sodium (SENOKOT-S) 8.6-50 MG tablet Take 1 tablet by mouth at bedtime as needed for constipation.     No facility-administered medications prior to visit.    PAST MEDICAL HISTORY: Past Medical History  Diagnosis Date  . Hypertension   . Seizures   . A-fib   . Dehydration   . Deafness   . Anxiety   . Reflux   .  Arthritis   . Depression   . Atrial fibrillation   . Tremor, essential   . Seizure   . BPH (benign prostatic hyperplasia)   . Syncope   . Deaf   . Melanoma   . GERD (gastroesophageal reflux disease)   . Mitral regurgitation   . Abnormality of gait     PAST SURGICAL HISTORY: Past Surgical History  Procedure Laterality Date  . Unknown    . Appendectomy    . Cataract extraction w/ intraocular lens  implant, bilateral      FAMILY HISTORY: Family History  Problem Relation Age of Onset  . Cancer Mother     Skin cancer  . Stroke Father   . Parkinsonism Sister   .  Diabetes Brother     SOCIAL HISTORY: History   Social History  . Marital Status: Unknown    Spouse Name: N/A    Number of Children: N/A  . Years of Education: N/A   Occupational History  . Not on file.   Social History Main Topics  . Smoking status: Never Smoker   . Smokeless tobacco: Not on file  . Alcohol Use: No  . Drug Use: No  . Sexual Activity: Not on file   Other Topics Concern  . Not on file   Social History Narrative   ** Merged History Encounter **          PHYSICAL EXAM  Filed Vitals:   07/13/14 1027  BP: 129/63  Pulse: 65  Temp: 98.1 F (36.7 C)  TempSrc: Oral  Resp: 16  Height: 6\' 3"  (1.905 m)  Weight: 219 lb 6.4 oz (99.519 kg)   Body mass index is 27.42 kg/(m^2).  Generalized: Well developed, in no acute distress   Neurological examination  Mentation: Alert oriented to time, place, history taking. Follows all commands speech and language fluent Cranial nerve II-XII: Pupils were equal round reactive to light. Extraocular movements were full, visual field were full on confrontational test. Facial sensation and strength were normal. Uvula tongue midline. Head turning and shoulder shrug  were normal and symmetric. Wears hearing aids. Motor: The motor testing reveals 5 over 5 strength of all 4 extremities. Good symmetric motor tone is noted throughout.  Sensory: Sensory testing is intact to soft touch on all 4 extremities. No evidence of extinction is noted.  Coordination: Cerebellar testing reveals good finger-nose-finger and heel-to-shin bilaterally.  Gait and station: patient uses a Rollator when ambulating. He takes small steps when initiating ambulation but then will develop a good stride. Tandem gait not attempted. Patient tends to lean backwards when he lets go of the Rollator. Reflexes: Deep tendon reflexes are symmetric but depressed   DIAGNOSTIC DATA (LABS, IMAGING, TESTING) - I reviewed patient records, labs, notes, testing and imaging  myself where available.  Lab Results  Component Value Date   WBC 6.6 04/26/2014   HGB 15.2 04/26/2014   HCT 43.4 04/26/2014   MCV 90.6 04/26/2014   PLT 212 04/26/2014      Component Value Date/Time   NA 131* 04/26/2014 0525   K 4.0 04/26/2014 0525   CL 92* 04/26/2014 0525   CO2 25 04/26/2014 0525   GLUCOSE 89 04/26/2014 0525   BUN 13 04/26/2014 0525   CREATININE 0.95 04/26/2014 0525   CALCIUM 9.1 04/26/2014 0525   PROT 7.0 04/25/2014 1904   ALBUMIN 3.9 04/25/2014 1904   AST 23 04/25/2014 1904   ALT 20 04/25/2014 1904   ALKPHOS 75 04/25/2014 1904  BILITOT 0.9 04/25/2014 1904   GFRNONAA 72* 04/26/2014 0525   GFRAA 84* 04/26/2014 0525   No results found for: CHOL, HDL, LDLCALC, LDLDIRECT, TRIG, CHOLHDL No results found for: HGBA1C Lab Results  Component Value Date   VITAMINB12 366 11/11/2012   Lab Results  Component Value Date   TSH 1.880 04/24/2014      ASSESSMENT AND PLAN 80 y.o. year old male  has a past medical history of Hypertension; Seizures; A-fib; Dehydration; Deafness; Anxiety; Reflux; Arthritis; Depression; Atrial fibrillation; Tremor, essential; Seizure; BPH (benign prostatic hyperplasia); Syncope; Deaf; Melanoma; GERD (gastroesophageal reflux disease); Mitral regurgitation; and Abnormality of gait. here with:  1. Seizures  The patient has been having episodes of staring spells that are accompanied with a laugh or crying sound. The daughter states that he is actually not crying or laughing when you look at him but rather he is staring off. She states that this will only last a couple of seconds but has been occurring weekly. This could represent a seizure. I will increase the Vimpat to 200 mg at bedtime. If the patient is unable to tolerate the increase in this medication they should let us know. The patient will follow up in 3 months or sooner if needed.   Ward Givens, MSN, NP-C 07/13/2014, 10:40 AM [facility] [address] [place],  [address] [phone]  Note: This document was prepared with digital dictation and possible smart phrase technology. Any transcriptional errors that result from this process are unintentional.

## 2014-07-13 NOTE — Patient Instructions (Signed)
Seizure, Adult A seizure means there is unusual activity in the brain. A seizure can cause changes in attention or behavior. Seizures often cause shaking (convulsions). Seizures often last from 30 seconds to 2 minutes. HOME CARE   If you are given medicines, take them exactly as told by your doctor.  Keep all doctor visits as told.  Do not swim or drive until your doctor says it is okay.  Teach others what to do if you have a seizure. They should:  Lay you on the ground.  Put a cushion under your head.  Loosen any tight clothing around your neck.  Turn you on your side.  Stay with you until you get better. GET HELP RIGHT AWAY IF:   The seizure lasts longer than 2 to 5 minutes.  The seizure is very bad.  The person does not wake up after the seizure.  The person's attention or behavior changes. Drive the person to the emergency room or call your local emergency services (911 in [place] MAKE SURE YOU:   Understand these instructions.  Will watch your condition.  Will get help right away if you are not doing well or get worse. Document Released: 01/30/2008 Document Revised: 11/05/2011 Document Reviewed: 08/01/2011 ExitCare Patient Information 2015 [name]. This information is not intended to replace advice given to you by your health care provider. Make sure you discuss any questions you have with your health care provider.  

## 2014-07-14 ENCOUNTER — Encounter: Payer: Self-pay | Admitting: Neurology

## 2014-07-14 DIAGNOSIS — R2689 Other abnormalities of gait and mobility: Secondary | ICD-10-CM | POA: Diagnosis not present

## 2014-07-14 DIAGNOSIS — R262 Difficulty in walking, not elsewhere classified: Secondary | ICD-10-CM | POA: Diagnosis not present

## 2014-07-14 DIAGNOSIS — R269 Unspecified abnormalities of gait and mobility: Secondary | ICD-10-CM | POA: Diagnosis not present

## 2014-07-16 DIAGNOSIS — R262 Difficulty in walking, not elsewhere classified: Secondary | ICD-10-CM | POA: Diagnosis not present

## 2014-07-16 DIAGNOSIS — R269 Unspecified abnormalities of gait and mobility: Secondary | ICD-10-CM | POA: Diagnosis not present

## 2014-07-16 DIAGNOSIS — R2689 Other abnormalities of gait and mobility: Secondary | ICD-10-CM | POA: Diagnosis not present

## 2014-07-18 DIAGNOSIS — R269 Unspecified abnormalities of gait and mobility: Secondary | ICD-10-CM | POA: Diagnosis not present

## 2014-07-18 DIAGNOSIS — R2689 Other abnormalities of gait and mobility: Secondary | ICD-10-CM | POA: Diagnosis not present

## 2014-07-18 DIAGNOSIS — R262 Difficulty in walking, not elsewhere classified: Secondary | ICD-10-CM | POA: Diagnosis not present

## 2014-07-19 DIAGNOSIS — R262 Difficulty in walking, not elsewhere classified: Secondary | ICD-10-CM | POA: Diagnosis not present

## 2014-07-19 DIAGNOSIS — R269 Unspecified abnormalities of gait and mobility: Secondary | ICD-10-CM | POA: Diagnosis not present

## 2014-07-19 DIAGNOSIS — R2689 Other abnormalities of gait and mobility: Secondary | ICD-10-CM | POA: Diagnosis not present

## 2014-07-20 ENCOUNTER — Encounter: Payer: Self-pay | Admitting: Neurology

## 2014-07-20 DIAGNOSIS — R2689 Other abnormalities of gait and mobility: Secondary | ICD-10-CM | POA: Diagnosis not present

## 2014-07-20 DIAGNOSIS — R269 Unspecified abnormalities of gait and mobility: Secondary | ICD-10-CM | POA: Diagnosis not present

## 2014-07-20 DIAGNOSIS — R262 Difficulty in walking, not elsewhere classified: Secondary | ICD-10-CM | POA: Diagnosis not present

## 2014-07-21 DIAGNOSIS — R262 Difficulty in walking, not elsewhere classified: Secondary | ICD-10-CM | POA: Diagnosis not present

## 2014-07-21 DIAGNOSIS — R2689 Other abnormalities of gait and mobility: Secondary | ICD-10-CM | POA: Diagnosis not present

## 2014-07-21 DIAGNOSIS — R269 Unspecified abnormalities of gait and mobility: Secondary | ICD-10-CM | POA: Diagnosis not present

## 2014-08-03 DIAGNOSIS — I10 Essential (primary) hypertension: Secondary | ICD-10-CM | POA: Diagnosis not present

## 2014-08-03 DIAGNOSIS — I480 Paroxysmal atrial fibrillation: Secondary | ICD-10-CM | POA: Diagnosis not present

## 2014-08-03 DIAGNOSIS — G40909 Epilepsy, unspecified, not intractable, without status epilepticus: Secondary | ICD-10-CM | POA: Diagnosis not present

## 2014-08-03 DIAGNOSIS — H6120 Impacted cerumen, unspecified ear: Secondary | ICD-10-CM | POA: Diagnosis not present

## 2014-08-11 DIAGNOSIS — R262 Difficulty in walking, not elsewhere classified: Secondary | ICD-10-CM | POA: Diagnosis not present

## 2014-08-11 DIAGNOSIS — R269 Unspecified abnormalities of gait and mobility: Secondary | ICD-10-CM | POA: Diagnosis not present

## 2014-08-11 DIAGNOSIS — R2689 Other abnormalities of gait and mobility: Secondary | ICD-10-CM | POA: Diagnosis not present

## 2014-08-16 ENCOUNTER — Ambulatory Visit (INDEPENDENT_AMBULATORY_CARE_PROVIDER_SITE_OTHER): Payer: Medicare Other | Admitting: Podiatry

## 2014-08-16 DIAGNOSIS — B351 Tinea unguium: Secondary | ICD-10-CM

## 2014-08-16 DIAGNOSIS — M79676 Pain in unspecified toe(s): Secondary | ICD-10-CM

## 2014-08-16 NOTE — Progress Notes (Signed)
Patient ID: Barry Dean, male   DOB: Dec 03, 1923, 80 y.o.   MRN: [id number]  Subjective: As patient presents complaining of painful toenails and walking and wearing shoes. He lives in assisted living facility.  Objective: Patient appears orientated 3 Use a roller walker The toenails are elongated, brittle, discolored, hypertrophic 6-10 and tender to palpation  Assessment: Symptomatic onychomycoses 6-10  Plan: Debrided toenails 10 without a bleeding  Reappoint 3 months

## 2014-08-31 DIAGNOSIS — I480 Paroxysmal atrial fibrillation: Secondary | ICD-10-CM | POA: Diagnosis not present

## 2014-08-31 DIAGNOSIS — I10 Essential (primary) hypertension: Secondary | ICD-10-CM | POA: Diagnosis not present

## 2014-09-13 ENCOUNTER — Encounter (HOSPITAL_COMMUNITY): Payer: Self-pay | Admitting: *Deleted

## 2014-09-13 ENCOUNTER — Inpatient Hospital Stay (HOSPITAL_COMMUNITY)
Admission: EM | Admit: 2014-09-13 | Discharge: 2014-09-17 | DRG: 378 | Disposition: A | Discharge status: Skilled Nursing Facility | Payer: Medicare Other | Attending: Internal Medicine | Admitting: Internal Medicine

## 2014-09-13 ENCOUNTER — Emergency Department (HOSPITAL_COMMUNITY): Payer: Medicare Other

## 2014-09-13 DIAGNOSIS — Z66 Do not resuscitate: Secondary | ICD-10-CM | POA: Diagnosis present

## 2014-09-13 DIAGNOSIS — R1110 Vomiting, unspecified: Secondary | ICD-10-CM | POA: Diagnosis not present

## 2014-09-13 DIAGNOSIS — T17908A Unspecified foreign body in respiratory tract, part unspecified causing other injury, initial encounter: Secondary | ICD-10-CM

## 2014-09-13 DIAGNOSIS — K449 Diaphragmatic hernia without obstruction or gangrene: Secondary | ICD-10-CM | POA: Diagnosis present

## 2014-09-13 DIAGNOSIS — K2970 Gastritis, unspecified, without bleeding: Secondary | ICD-10-CM | POA: Diagnosis not present

## 2014-09-13 DIAGNOSIS — K5900 Constipation, unspecified: Secondary | ICD-10-CM | POA: Diagnosis not present

## 2014-09-13 DIAGNOSIS — R110 Nausea: Secondary | ICD-10-CM

## 2014-09-13 DIAGNOSIS — R1115 Cyclical vomiting syndrome unrelated to migraine: Secondary | ICD-10-CM | POA: Diagnosis present

## 2014-09-13 DIAGNOSIS — H9190 Unspecified hearing loss, unspecified ear: Secondary | ICD-10-CM | POA: Diagnosis present

## 2014-09-13 DIAGNOSIS — Z85820 Personal history of malignant melanoma of skin: Secondary | ICD-10-CM

## 2014-09-13 DIAGNOSIS — Z0189 Encounter for other specified special examinations: Secondary | ICD-10-CM

## 2014-09-13 DIAGNOSIS — Z7982 Long term (current) use of aspirin: Secondary | ICD-10-CM | POA: Diagnosis not present

## 2014-09-13 DIAGNOSIS — K922 Gastrointestinal hemorrhage, unspecified: Secondary | ICD-10-CM | POA: Diagnosis not present

## 2014-09-13 DIAGNOSIS — I10 Essential (primary) hypertension: Secondary | ICD-10-CM | POA: Diagnosis present

## 2014-09-13 DIAGNOSIS — K8020 Calculus of gallbladder without cholecystitis without obstruction: Secondary | ICD-10-CM | POA: Diagnosis present

## 2014-09-13 DIAGNOSIS — G40909 Epilepsy, unspecified, not intractable, without status epilepticus: Secondary | ICD-10-CM

## 2014-09-13 DIAGNOSIS — K920 Hematemesis: Secondary | ICD-10-CM | POA: Diagnosis not present

## 2014-09-13 DIAGNOSIS — T17910A Gastric contents in respiratory tract, part unspecified causing asphyxiation, initial encounter: Secondary | ICD-10-CM | POA: Diagnosis present

## 2014-09-13 DIAGNOSIS — I4891 Unspecified atrial fibrillation: Secondary | ICD-10-CM | POA: Diagnosis present

## 2014-09-13 DIAGNOSIS — R7881 Bacteremia: Secondary | ICD-10-CM | POA: Diagnosis not present

## 2014-09-13 DIAGNOSIS — R112 Nausea with vomiting, unspecified: Secondary | ICD-10-CM | POA: Diagnosis not present

## 2014-09-13 DIAGNOSIS — N400 Enlarged prostate without lower urinary tract symptoms: Secondary | ICD-10-CM | POA: Diagnosis present

## 2014-09-13 DIAGNOSIS — K219 Gastro-esophageal reflux disease without esophagitis: Secondary | ICD-10-CM | POA: Diagnosis present

## 2014-09-13 DIAGNOSIS — R531 Weakness: Secondary | ICD-10-CM | POA: Diagnosis not present

## 2014-09-13 DIAGNOSIS — I340 Nonrheumatic mitral (valve) insufficiency: Secondary | ICD-10-CM | POA: Diagnosis present

## 2014-09-13 DIAGNOSIS — R197 Diarrhea, unspecified: Secondary | ICD-10-CM

## 2014-09-13 LAB — CBC WITH DIFFERENTIAL/PLATELET
BASOS PCT: 0 % (ref 0–1)
Basophils Absolute: 0 10*3/uL (ref 0.0–0.1)
EOS PCT: 1 % (ref 0–5)
Eosinophils Absolute: 0.1 10*3/uL (ref 0.0–0.7)
HEMATOCRIT: 42.3 % (ref 39.0–52.0)
HEMOGLOBIN: 15 g/dL (ref 13.0–17.0)
LYMPHS ABS: 1.7 10*3/uL (ref 0.7–4.0)
LYMPHS PCT: 21 % (ref 12–46)
MCH: 32.2 pg (ref 26.0–34.0)
MCHC: 35.5 g/dL (ref 30.0–36.0)
MCV: 90.8 fL (ref 78.0–100.0)
Monocytes Absolute: 0.7 10*3/uL (ref 0.1–1.0)
Monocytes Relative: 8 % (ref 3–12)
NEUTROS ABS: 5.7 10*3/uL (ref 1.7–7.7)
Neutrophils Relative %: 70 % (ref 43–77)
Platelets: 238 10*3/uL (ref 150–400)
RBC: 4.66 MIL/uL (ref 4.22–5.81)
RDW: 13.5 % (ref 11.5–15.5)
WBC: 8.1 10*3/uL (ref 4.0–10.5)

## 2014-09-13 LAB — URINALYSIS, ROUTINE W REFLEX MICROSCOPIC
BILIRUBIN URINE: NEGATIVE
Glucose, UA: NEGATIVE mg/dL
HGB URINE DIPSTICK: NEGATIVE
Ketones, ur: 15 mg/dL — AB
Leukocytes, UA: NEGATIVE
Nitrite: NEGATIVE
PH: 7 (ref 5.0–8.0)
PROTEIN: NEGATIVE mg/dL
SPECIFIC GRAVITY, URINE: 1.021 (ref 1.005–1.030)
Urobilinogen, UA: 0.2 mg/dL (ref 0.0–1.0)

## 2014-09-13 LAB — COMPREHENSIVE METABOLIC PANEL
ALBUMIN: 4.4 g/dL (ref 3.5–5.2)
ALT: 19 U/L (ref 0–53)
ANION GAP: 9 (ref 5–15)
AST: 24 U/L (ref 0–37)
Alkaline Phosphatase: 71 U/L (ref 39–117)
BILIRUBIN TOTAL: 0.9 mg/dL (ref 0.3–1.2)
BUN: 11 mg/dL (ref 6–23)
CALCIUM: 9.7 mg/dL (ref 8.4–10.5)
CO2: 30 mmol/L (ref 19–32)
CREATININE: 0.89 mg/dL (ref 0.50–1.35)
Chloride: 94 mEq/L — ABNORMAL LOW (ref 96–112)
GFR calc Af Amer: 86 mL/min — ABNORMAL LOW (ref >90)
GFR calc non Af Amer: 74 mL/min — ABNORMAL LOW (ref >90)
GLUCOSE: 109 mg/dL — AB (ref 70–99)
Potassium: 3.9 mmol/L (ref 3.5–5.1)
SODIUM: 133 mmol/L — AB (ref 135–145)
TOTAL PROTEIN: 7.3 g/dL (ref 6.0–8.3)

## 2014-09-13 LAB — I-STAT CG4 LACTIC ACID, ED: LACTIC ACID, VENOUS: 0.96 mmol/L (ref 0.5–2.2)

## 2014-09-13 LAB — PROTIME-INR
INR: 0.97 (ref 0.00–1.49)
PROTHROMBIN TIME: 13 s (ref 11.6–15.2)

## 2014-09-13 LAB — TROPONIN I: Troponin I: <0.03 ng/mL (ref <0.031)

## 2014-09-13 LAB — LIPASE, BLOOD: Lipase: 25 U/L (ref 11–59)

## 2014-09-13 LAB — BRAIN NATRIURETIC PEPTIDE: B Natriuretic Peptide: 39.2 pg/mL (ref 0.0–100.0)

## 2014-09-13 MED ORDER — PROMETHAZINE HCL 25 MG/ML IJ SOLN
12.5000 mg | Freq: Once | INTRAMUSCULAR | Status: AC
  Administered 2014-09-13: 12.5 mg via INTRAVENOUS
  Filled 2014-09-13: qty 1

## 2014-09-13 MED ORDER — ONDANSETRON HCL 4 MG/2ML IJ SOLN
4.0000 mg | Freq: Once | INTRAMUSCULAR | Status: AC
  Administered 2014-09-13: 4 mg via INTRAVENOUS
  Filled 2014-09-13: qty 2

## 2014-09-13 MED ORDER — SODIUM CHLORIDE 0.9 % IV BOLUS (SEPSIS)
500.0000 mL | Freq: Once | INTRAVENOUS | Status: AC
  Administered 2014-09-13: 500 mL via INTRAVENOUS

## 2014-09-13 MED ORDER — IOHEXOL 300 MG/ML  SOLN
100.0000 mL | Freq: Once | INTRAMUSCULAR | Status: AC | PRN
  Administered 2014-09-13: 100 mL via INTRAVENOUS

## 2014-09-13 NOTE — ED Notes (Signed)
Pt vomited contrast, Ct called and was notified.

## 2014-09-13 NOTE — ED Notes (Signed)
I Stat Lactic Acid results shown to Dr. Marcy Pheiffer. 

## 2014-09-13 NOTE — ED Notes (Addendum)
Pt ate dinner and began to vomit over 15 times in 2.5 hrs.  No other patrons became sick after eating.  Denies diareah.  Pt temp 99.1.  EMS gave pt 4mg  zophran iv.

## 2014-09-13 NOTE — ED Notes (Signed)
Patient transported to CT 

## 2014-09-14 ENCOUNTER — Inpatient Hospital Stay (HOSPITAL_COMMUNITY): Payer: Medicare Other

## 2014-09-14 ENCOUNTER — Emergency Department (HOSPITAL_COMMUNITY): Payer: Medicare Other

## 2014-09-14 DIAGNOSIS — I10 Essential (primary) hypertension: Secondary | ICD-10-CM | POA: Diagnosis present

## 2014-09-14 DIAGNOSIS — R197 Diarrhea, unspecified: Secondary | ICD-10-CM | POA: Diagnosis not present

## 2014-09-14 DIAGNOSIS — K219 Gastro-esophageal reflux disease without esophagitis: Secondary | ICD-10-CM | POA: Diagnosis present

## 2014-09-14 DIAGNOSIS — K5900 Constipation, unspecified: Secondary | ICD-10-CM | POA: Diagnosis not present

## 2014-09-14 DIAGNOSIS — K2970 Gastritis, unspecified, without bleeding: Secondary | ICD-10-CM | POA: Diagnosis not present

## 2014-09-14 DIAGNOSIS — K449 Diaphragmatic hernia without obstruction or gangrene: Secondary | ICD-10-CM | POA: Diagnosis not present

## 2014-09-14 DIAGNOSIS — K920 Hematemesis: Secondary | ICD-10-CM | POA: Diagnosis not present

## 2014-09-14 DIAGNOSIS — Z85820 Personal history of malignant melanoma of skin: Secondary | ICD-10-CM | POA: Diagnosis not present

## 2014-09-14 DIAGNOSIS — R110 Nausea: Secondary | ICD-10-CM

## 2014-09-14 DIAGNOSIS — N400 Enlarged prostate without lower urinary tract symptoms: Secondary | ICD-10-CM | POA: Diagnosis present

## 2014-09-14 DIAGNOSIS — K8020 Calculus of gallbladder without cholecystitis without obstruction: Secondary | ICD-10-CM | POA: Diagnosis present

## 2014-09-14 DIAGNOSIS — T17910A Gastric contents in respiratory tract, part unspecified causing asphyxiation, initial encounter: Secondary | ICD-10-CM | POA: Diagnosis not present

## 2014-09-14 DIAGNOSIS — H9190 Unspecified hearing loss, unspecified ear: Secondary | ICD-10-CM | POA: Diagnosis present

## 2014-09-14 DIAGNOSIS — R7881 Bacteremia: Secondary | ICD-10-CM | POA: Diagnosis not present

## 2014-09-14 DIAGNOSIS — R1110 Vomiting, unspecified: Secondary | ICD-10-CM | POA: Diagnosis not present

## 2014-09-14 DIAGNOSIS — I340 Nonrheumatic mitral (valve) insufficiency: Secondary | ICD-10-CM | POA: Diagnosis present

## 2014-09-14 DIAGNOSIS — I4891 Unspecified atrial fibrillation: Secondary | ICD-10-CM | POA: Diagnosis not present

## 2014-09-14 DIAGNOSIS — R1115 Cyclical vomiting syndrome unrelated to migraine: Secondary | ICD-10-CM | POA: Diagnosis present

## 2014-09-14 DIAGNOSIS — Z66 Do not resuscitate: Secondary | ICD-10-CM | POA: Diagnosis present

## 2014-09-14 DIAGNOSIS — G40909 Epilepsy, unspecified, not intractable, without status epilepticus: Secondary | ICD-10-CM | POA: Diagnosis present

## 2014-09-14 DIAGNOSIS — K922 Gastrointestinal hemorrhage, unspecified: Secondary | ICD-10-CM | POA: Diagnosis not present

## 2014-09-14 DIAGNOSIS — Z7982 Long term (current) use of aspirin: Secondary | ICD-10-CM | POA: Diagnosis not present

## 2014-09-14 DIAGNOSIS — R531 Weakness: Secondary | ICD-10-CM | POA: Diagnosis not present

## 2014-09-14 LAB — HEMOGLOBIN AND HEMATOCRIT, BLOOD
HCT: 38.5 % — ABNORMAL LOW (ref 39.0–52.0)
HEMATOCRIT: 39.3 % (ref 39.0–52.0)
HEMATOCRIT: 38 % — AB (ref 39.0–52.0)
HEMOGLOBIN: 13.3 g/dL (ref 13.0–17.0)
Hemoglobin: 13.7 g/dL (ref 13.0–17.0)
Hemoglobin: 13.4 g/dL (ref 13.0–17.0)

## 2014-09-14 LAB — CBC WITH DIFFERENTIAL/PLATELET
BASOS PCT: 0 % (ref 0–1)
Basophils Absolute: 0 10*3/uL (ref 0.0–0.1)
EOS PCT: 0 % (ref 0–5)
Eosinophils Absolute: 0 10*3/uL (ref 0.0–0.7)
HCT: 41.2 % (ref 39.0–52.0)
HEMOGLOBIN: 14.5 g/dL (ref 13.0–17.0)
Lymphocytes Relative: 22 % (ref 12–46)
Lymphs Abs: 1.8 10*3/uL (ref 0.7–4.0)
MCH: 32.7 pg (ref 26.0–34.0)
MCHC: 35.2 g/dL (ref 30.0–36.0)
MCV: 93 fL (ref 78.0–100.0)
MONO ABS: 0.6 10*3/uL (ref 0.1–1.0)
Monocytes Relative: 8 % (ref 3–12)
Neutro Abs: 5.8 10*3/uL (ref 1.7–7.7)
Neutrophils Relative %: 70 % (ref 43–77)
Platelets: 238 10*3/uL (ref 150–400)
RBC: 4.43 MIL/uL (ref 4.22–5.81)
RDW: 13.4 % (ref 11.5–15.5)
WBC: 8.2 10*3/uL (ref 4.0–10.5)

## 2014-09-14 LAB — COMPREHENSIVE METABOLIC PANEL
ALT: 18 U/L (ref 0–53)
AST: 24 U/L (ref 0–37)
Albumin: 4 g/dL (ref 3.5–5.2)
Alkaline Phosphatase: 66 U/L (ref 39–117)
Anion gap: 11 (ref 5–15)
BILIRUBIN TOTAL: 1.5 mg/dL — AB (ref 0.3–1.2)
BUN: 10 mg/dL (ref 6–23)
CHLORIDE: 98 meq/L (ref 96–112)
CO2: 25 mmol/L (ref 19–32)
CREATININE: 0.98 mg/dL (ref 0.50–1.35)
Calcium: 9 mg/dL (ref 8.4–10.5)
GFR calc Af Amer: 83 mL/min — ABNORMAL LOW (ref >90)
GFR calc non Af Amer: 71 mL/min — ABNORMAL LOW (ref >90)
GLUCOSE: 105 mg/dL — AB (ref 70–99)
POTASSIUM: 3.5 mmol/L (ref 3.5–5.1)
Sodium: 134 mmol/L — ABNORMAL LOW (ref 135–145)
Total Protein: 6.9 g/dL (ref 6.0–8.3)

## 2014-09-14 LAB — OCCULT BLOOD GASTRIC / DUODENUM (SPECIMEN CUP): Occult Blood, Gastric: POSITIVE — AB

## 2014-09-14 LAB — TYPE AND SCREEN
ABO/RH(D): A POS
Antibody Screen: NEGATIVE

## 2014-09-14 LAB — MRSA PCR SCREENING: MRSA by PCR: NEGATIVE

## 2014-09-14 MED ORDER — PROMETHAZINE HCL 25 MG/ML IJ SOLN: 12.5000 mg | Freq: Four times a day (QID) | INTRAMUSCULAR | Status: DC | PRN

## 2014-09-14 MED ORDER — LEVETIRACETAM 750 MG PO TABS
750.0000 mg | ORAL_TABLET | Freq: Two times a day (BID) | ORAL | Status: DC
  Filled 2014-09-14 (×2): qty 1

## 2014-09-14 MED ORDER — ONDANSETRON HCL 4 MG PO TABS
4.0000 mg | ORAL_TABLET | Freq: Four times a day (QID) | ORAL | Status: DC
  Filled 2014-09-14 (×4): qty 1

## 2014-09-14 MED ORDER — LEVETIRACETAM IN NACL 500 MG/100ML IV SOLN
500.0000 mg | Freq: Two times a day (BID) | INTRAVENOUS | Status: DC
  Administered 2014-09-14 – 2014-09-17 (×7): 500 mg via INTRAVENOUS
  Filled 2014-09-14 (×8): qty 100

## 2014-09-14 MED ORDER — LORAZEPAM 2 MG/ML IJ SOLN
2.0000 mg | Freq: Once | INTRAMUSCULAR | Status: AC
  Administered 2014-09-14: 2 mg via INTRAVENOUS
  Filled 2014-09-14: qty 1

## 2014-09-14 MED ORDER — SODIUM CHLORIDE 0.9 % IV SOLN
80.0000 mg | Freq: Once | INTRAVENOUS | Status: AC
  Administered 2014-09-14: 80 mg via INTRAVENOUS
  Filled 2014-09-14: qty 80

## 2014-09-14 MED ORDER — PIPERACILLIN-TAZOBACTAM 3.375 G IVPB
3.3750 g | Freq: Three times a day (TID) | INTRAVENOUS | Status: DC
  Filled 2014-09-14 (×2): qty 50

## 2014-09-14 MED ORDER — AMOXICILLIN-POT CLAVULANATE 500-125 MG PO TABS
1.0000 | ORAL_TABLET | Freq: Three times a day (TID) | ORAL | Status: DC
  Administered 2014-09-14: 500 mg via ORAL
  Filled 2014-09-14 (×3): qty 1

## 2014-09-14 MED ORDER — BISACODYL 10 MG RE SUPP
10.0000 mg | Freq: Every day | RECTAL | Status: DC
  Administered 2014-09-14 – 2014-09-16 (×2): 10 mg via RECTAL
  Filled 2014-09-14 (×3): qty 1

## 2014-09-14 MED ORDER — SODIUM CHLORIDE 0.9 % IV SOLN
1250.0000 mg | Freq: Two times a day (BID) | INTRAVENOUS | Status: DC
  Administered 2014-09-14: 1250 mg via INTRAVENOUS
  Filled 2014-09-14 (×2): qty 1250

## 2014-09-14 MED ORDER — KCL IN DEXTROSE-NACL 40-5-0.9 MEQ/L-%-% IV SOLN
INTRAVENOUS | Status: DC
  Administered 2014-09-14: 10:00:00 via INTRAVENOUS
  Filled 2014-09-14 (×2): qty 1000

## 2014-09-14 MED ORDER — LACOSAMIDE 200 MG PO TABS: 200.0000 mg | ORAL_TABLET | Freq: Every day | ORAL | Status: DC

## 2014-09-14 MED ORDER — SODIUM CHLORIDE 0.9 % IV SOLN
8.0000 mg/h | INTRAVENOUS | Status: DC
  Administered 2014-09-14: 8 mg/h via INTRAVENOUS
  Filled 2014-09-14 (×2): qty 80

## 2014-09-14 MED ORDER — SODIUM CHLORIDE 0.9 % IV SOLN
INTRAVENOUS | Status: DC
  Administered 2014-09-14: 03:00:00 via INTRAVENOUS

## 2014-09-14 MED ORDER — SODIUM CHLORIDE 0.9 % IV SOLN: INTRAVENOUS | Status: DC

## 2014-09-14 MED ORDER — PANTOPRAZOLE SODIUM 40 MG IV SOLR: 40.0000 mg | Freq: Two times a day (BID) | INTRAVENOUS | Status: DC

## 2014-09-14 MED ORDER — SODIUM CHLORIDE 0.9 % IV SOLN
200.0000 mg | INTRAVENOUS | Status: DC
  Administered 2014-09-14 – 2014-09-17 (×4): 200 mg via INTRAVENOUS
  Filled 2014-09-14 (×7): qty 20

## 2014-09-14 MED ORDER — CETYLPYRIDINIUM CHLORIDE 0.05 % MT LIQD
7.0000 mL | Freq: Two times a day (BID) | OROMUCOSAL | Status: DC
  Administered 2014-09-14 – 2014-09-17 (×5): 7 mL via OROMUCOSAL

## 2014-09-14 MED ORDER — PIPERACILLIN-TAZOBACTAM 3.375 G IVPB 30 MIN
3.3750 g | Freq: Once | INTRAVENOUS | Status: AC
  Administered 2014-09-14: 3.375 g via INTRAVENOUS
  Filled 2014-09-14: qty 50

## 2014-09-14 MED ORDER — ONDANSETRON HCL 4 MG/2ML IJ SOLN
4.0000 mg | Freq: Four times a day (QID) | INTRAMUSCULAR | Status: DC
  Administered 2014-09-14 – 2014-09-17 (×14): 4 mg via INTRAVENOUS
  Filled 2014-09-14 (×15): qty 2

## 2014-09-14 MED ORDER — HYDRALAZINE HCL 20 MG/ML IJ SOLN
10.0000 mg | Freq: Four times a day (QID) | INTRAMUSCULAR | Status: DC | PRN
  Administered 2014-09-14 – 2014-09-17 (×2): 10 mg via INTRAVENOUS
  Filled 2014-09-14 (×2): qty 1

## 2014-09-14 MED ORDER — PROMETHAZINE HCL 25 MG/ML IJ SOLN
12.5000 mg | Freq: Four times a day (QID) | INTRAMUSCULAR | Status: DC
  Administered 2014-09-14: 12.5 mg via INTRAVENOUS
  Filled 2014-09-14 (×6): qty 1

## 2014-09-14 MED ORDER — PANTOPRAZOLE SODIUM 40 MG PO TBEC: 40.0000 mg | DELAYED_RELEASE_TABLET | Freq: Every day | ORAL | Status: DC

## 2014-09-14 MED ORDER — BENZOCAINE 20 % MT SOLN
Freq: Once | OROMUCOSAL | Status: DC
  Filled 2014-09-14: qty 57

## 2014-09-14 MED ORDER — PANTOPRAZOLE SODIUM 40 MG IV SOLR
40.0000 mg | Freq: Two times a day (BID) | INTRAVENOUS | Status: DC
  Administered 2014-09-14 – 2014-09-17 (×7): 40 mg via INTRAVENOUS
  Filled 2014-09-14 (×8): qty 40

## 2014-09-14 MED ORDER — ONDANSETRON HCL 4 MG/2ML IJ SOLN
4.0000 mg | Freq: Once | INTRAMUSCULAR | Status: AC
  Administered 2014-09-14: 4 mg via INTRAVENOUS

## 2014-09-14 NOTE — ED Provider Notes (Addendum)
CSN: [id number]     Arrival date & time 09/13/14  2038 History   First MD Initiated Contact with Patient 09/13/14 2042     Chief Complaint  Patient presents with  . Emesis     (Consider location/radiation/quality/duration/timing/severity/associated sxs/prior Treatment) HPI The patient reports he felt a little bit sick before dinner time. He reports he ate dinner and then he started vomiting. Once he started vomiting and he has not stopped. He is on at least every 15 minutes. There is been no diarrhea associated. He denies any significant amount of abdominal pain. He lives at an assisted living and at this point no one else has been reported as ill. Past Medical History  Diagnosis Date  . Hypertension   . Seizures   . A-fib   . Dehydration   . Deafness   . Anxiety   . Reflux   . Arthritis   . Depression   . Atrial fibrillation   . Tremor, essential   . Seizure   . BPH (benign prostatic hyperplasia)   . Syncope   . Deaf   . Melanoma   . GERD (gastroesophageal reflux disease)   . Mitral regurgitation   . Abnormality of gait    Past Surgical History  Procedure Laterality Date  . Unknown    . Appendectomy    . Cataract extraction w/ intraocular lens  implant, bilateral     Family History  Problem Relation Age of Onset  . Cancer Mother     Skin cancer  . Stroke Father   . Parkinsonism Sister   . Diabetes Brother    History  Substance Use Topics  . Smoking status: Never Smoker   . Smokeless tobacco: Not on file  . Alcohol Use: No    Review of Systems 10 Systems reviewed and are negative for acute change except as noted in the HPI.    Allergies  Topamax; Uroxatral; and Valium  Home Medications   Prior to Admission medications   Medication Sig Start Date End Date Taking? Authorizing Provider  acetaminophen (TYLENOL) 500 MG tablet Take 500 mg by mouth every 6 (six) hours as needed for pain. Not to exceed 3 gms of acetaminophen in 24 hours   Yes Historical  Provider, MD  amLODipine-benazepril (LOTREL) 5-20 MG per capsule Take 1 capsule by mouth daily.   Yes Historical Provider, MD  aspirin 81 MG chewable tablet Chew 81 mg by mouth every evening.    Yes Historical Provider, MD  Emollient Jilda Panda ADVANCED THERAPY) LOTN Apply 1 application topically daily. Apply to feet after bathing every day   Yes Historical Provider, MD  HYDROcodone-acetaminophen (NORCO/VICODIN) 5-325 MG per tablet Take 1 tablet by mouth every 8 (eight) hours as needed for moderate pain.  06/29/14  Yes Historical Provider, MD  ipratropium-albuterol (DUONEB) 0.5-2.5 (3) MG/3ML SOLN Inhale 3 mLs into the lungs 2 (two) times daily.  07/05/14  Yes Historical Provider, MD  iron polysaccharides (NIFEREX) 150 MG capsule Take 150 mg by mouth daily.   Yes Historical Provider, MD  labetalol (NORMODYNE) 100 MG tablet Take 100 mg by mouth 2 (two) times daily.    Yes Historical Provider, MD  lacosamide (VIMPAT) 200 MG TABS tablet Take 1 tablet (200 mg total) by mouth at bedtime. 07/13/14  Yes Ward Givens, NP  levETIRAcetam (KEPPRA) 750 MG tablet Take 750 mg by mouth 2 (two) times daily.  07/12/14  Yes Historical Provider, MD  omeprazole (PRILOSEC) 20 MG capsule Take 20 mg by mouth  daily.   Yes Historical Provider, MD  polyethylene glycol (MIRALAX / GLYCOLAX) packet Take 17 g by mouth every other day.    Yes Historical Provider, MD  sennosides-docusate sodium (SENOKOT-S) 8.6-50 MG tablet Take 1 tablet by mouth at bedtime as needed for constipation.   Yes Historical Provider, MD   BP 168/87 mmHg  Pulse 93  Temp(Src) 99.1 F (37.3 C) (Oral)  Resp 20  SpO2 100% Physical Exam  Constitutional: He is oriented to person, place, and time. He appears well-developed and well-nourished.  Patient is having emesis every few minutes. He is alert and nontoxic. He does not have any acute respiratory distress. He appears uncomfortable.  HENT:  Head: Normocephalic and atraumatic.  Eyes: EOM are normal.  Pupils are equal, round, and reactive to light.  Neck: Neck supple.  Cardiovascular: Normal rate, regular rhythm, normal heart sounds and intact distal pulses.   Pulmonary/Chest: Effort normal and breath sounds normal.  Abdominal: Soft. Bowel sounds are normal. He exhibits no distension. There is no tenderness.  Musculoskeletal: Normal range of motion. He exhibits no edema.  Neurological: He is alert and oriented to person, place, and time. He has normal strength. Coordination normal. GCS eye subscore is 4. GCS verbal subscore is 5. GCS motor subscore is 6.  Skin: Skin is warm, dry and intact.  Psychiatric: He has a normal mood and affect.    ED Course  Procedures (including critical care time) Labs Review Labs Reviewed  COMPREHENSIVE METABOLIC PANEL - Abnormal; Notable for the following:    Sodium 133 (*)    Chloride 94 (*)    Glucose, Bld 109 (*)    GFR calc non Af Amer 74 (*)    GFR calc Af Amer 86 (*)    All other components within normal limits  URINALYSIS, ROUTINE W REFLEX MICROSCOPIC - Abnormal; Notable for the following:    Ketones, ur 15 (*)    All other components within normal limits  CULTURE, BLOOD (ROUTINE X 2)  CULTURE, BLOOD (ROUTINE X 2)  LIPASE, BLOOD  BRAIN NATRIURETIC PEPTIDE  TROPONIN I  CBC WITH DIFFERENTIAL  PROTIME-INR  I-STAT CG4 LACTIC ACID, ED    Imaging Review Dg Chest 1 View  09/14/2014   CLINICAL DATA:  Vomiting  EXAM: CHEST - 1 VIEW  COMPARISON:  04/30/2014  FINDINGS: There is a large gas containing hiatal hernia, recently characterized by abdominal CT. No definite increased distension. Reticular densities in the lower lungs is chronic and by CT is stable over 1 year. No edema, effusion, or pneumothorax. Unchanged aortic and cardiac contours, distorted by leftward rotation.  IMPRESSION: 1. Large hiatal hernia. 2. Chronic lung disease without acute superimposed finding.   Electronically Signed   By: Jorje Guild M.D.   On: 09/14/2014 00:34   Ct  Abdomen Pelvis W Contrast  09/14/2014   CLINICAL DATA:  Pt ate dinner and began to vomit over 15 times in 2.5 hrs. No other patrons became sick after eating. Denies diareah. Pt temp 99.1. EMS gave pt 4mg  zophran  EXAM: CT ABDOMEN AND PELVIS WITH CONTRAST  TECHNIQUE: Multidetector CT imaging of the abdomen and pelvis was performed using the standard protocol following bolus administration of intravenous contrast.  CONTRAST:  119mL OMNIPAQUE IOHEXOL 300 MG/ML  SOLN  COMPARISON:  04/25/2014.  FINDINGS: Moderate hiatal hernia, predominately paraesophageal, stable. Stable reticular lung base opacities most evident in the left lower lobe, consistent with scarring. Heart normal in size.  Liver and spleen: Unremarkable. Vague density  in the dependent gallbladder measuring 16 mm. This may reflect a in mildly opaque stone. No wall thickening. No acute cholecystitis. No bile duct dilation. Unremarkable pancreas.  No adrenal masses. 12 mm low-density area along the medial lower pole of the left kidney most consistent with a cyst. No other renal masses, no stones and no hydronephrosis. Normal ureters. Unremarkable bladder.  No adenopathy.  No abnormal fluid collections.  Mild increased stool in the colon. No colonic wall thickening or inflammatory changes. Small bowel is unremarkable. Appendix not visualized. No acute appendicitis.  Degenerative changes throughout the visualized spine. Bones are demineralized. No osteoblastic or osteolytic lesions.  IMPRESSION: 1. No acute findings. 2. Moderate hiatal hernia, paraesophageal, stable. 3. Chronic lung base scarring. 4. Mild increased stool in the colon. 5. Probable minimally opaque gallstone.  No acute cholecystitis. 6. Significant degenerative changes throughout the visualized spine.   Electronically Signed   By: Lajean Manes M.D.   On: 09/14/2014 00:02     EKG Interpretation   Date/Time:  Monday September 13 2014 21:02:28 EST Ventricular Rate:  97 PR Interval:  200 QRS  Duration: 86 QT Interval:  350 QTC Calculation: 445 R Axis:   122 Text Interpretation:  Sinus rhythm Left posterior fascicular block agree.  no chnage from old Confirmed by Johnney Killian, MD, Jeannie Done [phone]) on 09/13/2014  9:09:48 PM      MDM   Final diagnoses:  Intractable vomiting with nausea, vomiting of unspecified type   The patient has had a relatively acute onset of vomiting illness. At this point his abdominal examination is nonsurgical and he is not endorsing localizing abdominal pain. By report the patient was not significantly ill and after eating developed intractable vomiting. He has been treated with Zofran and Phenergan in the emergency department but continues to have vomiting and thus at this point NG tube will be placed and the patient will be admitted for continued treatment.    Charlesetta Shanks, MD 09/14/14 [phone]  Nursing staff had attempted placement of the NG tube with the patient gagging and vomiting and inability to pass the tube. I also attempted to pass the tube with the patient developing vomiting and the tube coiling. between nursing and myself 4 attempts were made to pass NG tube however with each attempt the patient gagged and the tube coiled in his mouth with  emesis at each attempt. Due to concern for aspiration, Dr. Ernestina Patches has empirically started the patient on Zosyn. I had consulted to general surgery regarding inability to establish an NG tube, and they suggested for a difficult placement they would have radiology do it under flouroscopy. At this and the patient will be admitted to step down and if vomiting continues determination will be made whether or not to use fluoroscopy.  Charlesetta Shanks, MD 09/14/14 [phone]

## 2014-09-14 NOTE — H&P (Signed)
Hospitalist Admission History and Physical  Patient name: Barry Dean Medical record number: [id number] Date of birth: Jul 18, 1924 Age: 81 y.o. Gender: male  Primary Care Provider: [name], MD  Chief Complaint: intractable emesis   History of Present Illness:This is a 81 y.o. year old male with significant past medical history of htn, seizure disorder, deafness, afib, gerd, mitral regurgitation presenting with intractable emesis. Level V caveat as NG tube being actively placed at bedside in setting of baseline deafness w/ no hearing aid in place. Protracted attempt. Pt resident of assisted living facility. Per report, pt was eating dinner when he began to persistently vomit, up to 15 times over 2.5 hours. No reports of diarrhea or abd pain. Was sent to ER because of persistent sxs. Was given IV zofran w/ minimal improvement in sxs.  Presented to ER T 99.1, HR 90s-100s, resp 10s-20s, BP 160s-190s/90s-100s in setting of emesis, satting mid 90s on 2L non rebreather. WBC 8.1, hgb 15.1, Na 133, Cr 0.89. Lipase 25. Lactate 0.96. CXR shows large hiatal hernia w/ chronic lung disease. No acute findings. CT abd and pelvis w/ contrast negative for any acute findings. + hiatal hernia, chronic lung scarring, mild increased stool in colon, minimally opaque gallstone w/ no acute cholecystitis.  Noted intractable and recurrent episodes of emesis at bedside, worsened w/ attempt of NGT placement. High concern for aspiration. Concern for emesis being coffee ground vs. Feculent.  Assessment and Plan: Barry Dean is a 81 y.o. year old male presenting with intractable emesis   Active Problems:   Emesis, persistent   1-Intractable Emesis  -attempting placement of NGT at bedside in ER  -gastroccult-GI c/s if positive  -protonix gtt -IV vanc and zosyn give aspiration risk  -repeat CXR once NGT placed.  -blood cultures -consider repeat CT vs US imaging if pain ensues -stepdown bed   FEN/GI:  NPO  Prophylaxis: SCDs pending gastroccult Disposition: pending further evaluation  Code Status:Full Code    Patient Active Problem List   Diagnosis Date Noted  . Emesis, persistent 09/14/2014  . Syncope 09/13/2013  . Respiratory failure 09/13/2013  . Noninfectious gastroenteritis and colitis 11/12/2012  . Acute and chronic respiratory failure 11/12/2012  . Community acquired pneumonia 11/12/2012  . HCAP (healthcare-associated pneumonia) 07/18/2011  . Seizure disorder 07/18/2011  . HTN (hypertension) 07/18/2011  . Hyponatremia 07/18/2011   Past Medical History: Past Medical History  Diagnosis Date  . Hypertension   . Seizures   . A-fib   . Dehydration   . Deafness   . Anxiety   . Reflux   . Arthritis   . Depression   . Atrial fibrillation   . Tremor, essential   . Seizure   . BPH (benign prostatic hyperplasia)   . Syncope   . Deaf   . Melanoma   . GERD (gastroesophageal reflux disease)   . Mitral regurgitation   . Abnormality of gait     Past Surgical History: Past Surgical History  Procedure Laterality Date  . Unknown    . Appendectomy    . Cataract extraction w/ intraocular lens  implant, bilateral      Social History: History   Social History  . Marital Status: Unknown    Spouse Name: N/A    Number of Children: N/A  . Years of Education: N/A   Social History Main Topics  . Smoking status: Never Smoker   . Smokeless tobacco: None  . Alcohol Use: No  . Drug Use: No  .  Sexual Activity: None   Other Topics Concern  . None   Social History Narrative   ** Merged History Encounter **        Family History: Family History  Problem Relation Age of Onset  . Cancer Mother     Skin cancer  . Stroke Father   . Parkinsonism Sister   . Diabetes Brother     Allergies: Allergies  Allergen Reactions  . Topamax Other (See Comments)    Unknown reaction  . Uroxatral [Alfuzosin Hydrochloride] Other (See Comments)    Unknown reaction  . Valium  Other (See Comments)    Unknown reaction    Current Facility-Administered Medications  Medication Dose Route Frequency Provider Last Rate Last Dose  . 0.9 %  sodium chloride infusion   Intravenous Continuous [name], MD      . benzocaine (HURRICAINE) 20 % mouth spray   Mouth/Throat Once Charlesetta Shanks, MD      . LORazepam (ATIVAN) injection 2 mg  2 mg Intravenous Once Charlesetta Shanks, MD      . ondansetron (ZOFRAN) tablet 4 mg  4 mg Oral 4 times per day [name], MD       Or  . ondansetron [facility]) injection 4 mg  4 mg Intravenous 4 times per day [name], MD      . pantoprazole (PROTONIX) 80 mg in sodium chloride 0.9 % 100 mL IVPB  80 mg Intravenous Once [name], MD      . pantoprazole (PROTONIX) 80 mg in sodium chloride 0.9 % 250 mL (0.32 mg/mL) infusion  8 mg/hr Intravenous Continuous [name], MD      . Derrill Memo ON 09/17/2014] pantoprazole (PROTONIX) injection 40 mg  40 mg Intravenous Q12H [name], MD      . promethazine (PHENERGAN) injection 25 mg  25 mg Intravenous Q6H [name], MD       Current Outpatient Prescriptions  Medication Sig Dispense Refill  . acetaminophen (TYLENOL) 500 MG tablet Take 500 mg by mouth every 6 (six) hours as needed for pain. Not to exceed 3 gms of acetaminophen in 24 hours    . amLODipine-benazepril (LOTREL) 5-20 MG per capsule Take 1 capsule by mouth daily.    Marland Kitchen aspirin 81 MG chewable tablet Chew 81 mg by mouth every evening.     Marland Kitchen Emollient (LUBRIDERM ADVANCED THERAPY) LOTN Apply 1 application topically daily. Apply to feet after bathing every day    . HYDROcodone-acetaminophen (NORCO/VICODIN) 5-325 MG per tablet Take 1 tablet by mouth every 8 (eight) hours as needed for moderate pain.     Marland Kitchen ipratropium-albuterol (DUONEB) 0.5-2.5 (3) MG/3ML SOLN Inhale 3 mLs into the lungs 2 (two) times daily.     . iron polysaccharides (NIFEREX) 150 MG capsule Take 150 mg by mouth daily.    Marland Kitchen labetalol (NORMODYNE) 100 MG tablet Take 100  mg by mouth 2 (two) times daily.     Marland Kitchen lacosamide (VIMPAT) 200 MG TABS tablet Take 1 tablet (200 mg total) by mouth at bedtime. 30 tablet 3  . levETIRAcetam (KEPPRA) 750 MG tablet Take 750 mg by mouth 2 (two) times daily.     Marland Kitchen omeprazole (PRILOSEC) 20 MG capsule Take 20 mg by mouth daily.    . polyethylene glycol (MIRALAX / GLYCOLAX) packet Take 17 g by mouth every other day.     . sennosides-docusate sodium (SENOKOT-S) 8.6-50 MG tablet Take 1 tablet by mouth at bedtime as needed for constipation.     Review  Of Systems: 12 point ROS negative except as noted above in HPI.  Physical Exam: Filed Vitals:   09/13/14 2330  BP: 168/87  Pulse: 93  Temp:   Resp: 20    General: obtunded, confused  HEENT: extra ocular movement intact, active emesis across mouth and chest  Heart: S1, S2 normal, no murmur, rub or gallop, regular rate and rhythm Lungs: clear to auscultation, no wheezes or rales and unlabored breathing Abdomen: + bowel sounds, minimal to mild abd TTP  Extremities: extremities normal, atraumatic, no cyanosis or edema Skin:no rashes Neurology: minimally to mildly copperative to exam   Labs and Imaging: Lab Results  Component Value Date/Time   NA 133* 09/13/2014 10:00 PM   K 3.9 09/13/2014 10:00 PM   CL 94* 09/13/2014 10:00 PM   CO2 30 09/13/2014 10:00 PM   BUN 11 09/13/2014 10:00 PM   CREATININE 0.89 09/13/2014 10:00 PM   GLUCOSE 109* 09/13/2014 10:00 PM   Lab Results  Component Value Date   WBC 8.1 09/13/2014   HGB 15.0 09/13/2014   HCT 42.3 09/13/2014   MCV 90.8 09/13/2014   PLT 238 09/13/2014   Urinalysis    Component Value Date/Time   COLORURINE YELLOW 09/13/2014 2228   APPEARANCEUR CLEAR 09/13/2014 2228   LABSPEC 1.021 09/13/2014 2228   PHURINE 7.0 09/13/2014 2228   GLUCOSEU NEGATIVE 09/13/2014 2228   HGBUR NEGATIVE 09/13/2014 2228   BILIRUBINUR NEGATIVE 09/13/2014 2228   KETONESUR 15* 09/13/2014 2228   PROTEINUR NEGATIVE 09/13/2014 2228   UROBILINOGEN  0.2 09/13/2014 2228   NITRITE NEGATIVE 09/13/2014 2228   LEUKOCYTESUR NEGATIVE 09/13/2014 2228       Dg Chest 1 View  09/14/2014   CLINICAL DATA:  Vomiting  EXAM: CHEST - 1 VIEW  COMPARISON:  04/30/2014  FINDINGS: There is a large gas containing hiatal hernia, recently characterized by abdominal CT. No definite increased distension. Reticular densities in the lower lungs is chronic and by CT is stable over 1 year. No edema, effusion, or pneumothorax. Unchanged aortic and cardiac contours, distorted by leftward rotation.  IMPRESSION: 1. Large hiatal hernia. 2. Chronic lung disease without acute superimposed finding.   Electronically Signed   By: Jorje Guild M.D.   On: 09/14/2014 00:34   Ct Abdomen Pelvis W Contrast  09/14/2014   CLINICAL DATA:  Pt ate dinner and began to vomit over 15 times in 2.5 hrs. No other patrons became sick after eating. Denies diareah. Pt temp 99.1. EMS gave pt 4mg  zophran  EXAM: CT ABDOMEN AND PELVIS WITH CONTRAST  TECHNIQUE: Multidetector CT imaging of the abdomen and pelvis was performed using the standard protocol following bolus administration of intravenous contrast.  CONTRAST:  150mL OMNIPAQUE IOHEXOL 300 MG/ML  SOLN  COMPARISON:  04/25/2014.  FINDINGS: Moderate hiatal hernia, predominately paraesophageal, stable. Stable reticular lung base opacities most evident in the left lower lobe, consistent with scarring. Heart normal in size.  Liver and spleen: Unremarkable. Vague density in the dependent gallbladder measuring 16 mm. This may reflect a in mildly opaque stone. No wall thickening. No acute cholecystitis. No bile duct dilation. Unremarkable pancreas.  No adrenal masses. 12 mm low-density area along the medial lower pole of the left kidney most consistent with a cyst. No other renal masses, no stones and no hydronephrosis. Normal ureters. Unremarkable bladder.  No adenopathy.  No abnormal fluid collections.  Mild increased stool in the colon. No colonic wall  thickening or inflammatory changes. Small bowel is unremarkable. Appendix not visualized. No  acute appendicitis.  Degenerative changes throughout the visualized spine. Bones are demineralized. No osteoblastic or osteolytic lesions.  IMPRESSION: 1. No acute findings. 2. Moderate hiatal hernia, paraesophageal, stable. 3. Chronic lung base scarring. 4. Mild increased stool in the colon. 5. Probable minimally opaque gallstone.  No acute cholecystitis. 6. Significant degenerative changes throughout the visualized spine.   Electronically Signed   By: Lajean Manes M.D.   On: 09/14/2014 00:02           [name] MD  Pager: [phone]

## 2014-09-14 NOTE — Progress Notes (Signed)
ANTIBIOTIC CONSULT NOTE - INITIAL  Pharmacy Consult for Vancocin and Zosyn Indication: rule out sepsis  Allergies  Allergen Reactions  . Topamax Other (See Comments)    Unknown reaction  . Uroxatral [Alfuzosin Hydrochloride] Other (See Comments)    Unknown reaction  . Valium Other (See Comments)    Unknown reaction    Patient Measurements: Weight: 99kg  Vital Signs: Temp: 99.1 F (37.3 C) (01/18 2112) Temp Source: Oral (01/18 2112) BP: 168/87 mmHg (01/18 2330) Pulse Rate: 93 (01/18 2330)  Labs:  Recent Labs  09/13/14 2200  WBC 8.1  HGB 15.0  PLT 238  CREATININE 0.89   Medical History: Past Medical History  Diagnosis Date  . Hypertension   . Seizures   . A-fib   . Dehydration   . Deafness   . Anxiety   . Reflux   . Arthritis   . Depression   . Atrial fibrillation   . Tremor, essential   . Seizure   . BPH (benign prostatic hyperplasia)   . Syncope   . Deaf   . Melanoma   . GERD (gastroesophageal reflux disease)   . Mitral regurgitation   . Abnormality of gait      Assessment: 81yo male c/o vomiting every 15 minutes since eating dinner and continues to vomit after tx w/ Zofran and Phenergan in ED, concern for sepsis, to begin IV ABX.  Goal of Therapy:  Vancomycin trough level 15-20 mcg/ml  Plan:  Will begin vancomycin 1250mg  IV Q12H and Zosyn 3.375g IV Q8H and monitor CBC, Cx, levels prn.  Wynona Neat, PharmD, BCPS  09/14/2014,2:00 AM

## 2014-09-14 NOTE — Consult Note (Signed)
Lab Results  Component Value Date   HGB 13.7 09/14/2014   HGB 14.5 09/14/2014   HGB 15.0 09/13/2014   HCT 39.3 09/14/2014   HCT 41.2 09/14/2014   HCT 42.3 09/13/2014   ALKPHOS 66 09/14/2014   ALKPHOS 71 09/13/2014   ALKPHOS 75 04/25/2014   AST 24 09/14/2014   AST 24 09/13/2014   AST 23 04/25/2014   ALT 18 09/14/2014   ALT 19 09/13/2014   ALT 20 04/25/2014   Subjective:   HPI  The patient is an 81 year old male who was admitted to the hospital because of ongoing intractable emesis. He has a history of a large hiatal hernia. He has been in the hospital before with nausea and vomiting and has had an NG tube placed in the past but this admission the NG tube could not be placed by either the nurse or physicians. He was sent to the emergency room from his assisted living facility because of ongoing vomiting for approximately 2-1/2 hours. He continues to have emesis here in the hospital. He has also now been having coffee-ground emesis.  Review of Systems No chest pain or shortness of breath at this time  Past Medical History  Diagnosis Date  . Hypertension   . Seizures   . A-fib   . Dehydration   . Deafness   . Anxiety   . Reflux   . Arthritis   . Depression   . Atrial fibrillation   . Tremor, essential   . Seizure   . BPH (benign prostatic hyperplasia)   . Syncope   . Deaf   . Melanoma   . GERD (gastroesophageal reflux disease)   . Mitral regurgitation   . Abnormality of gait    Past Surgical History  Procedure Laterality Date  . Unknown    . Appendectomy    . Cataract extraction w/ intraocular lens  implant, bilateral     History   Social History  . Marital Status: Unknown    Spouse Name: N/A    Number of Children: N/A  . Years of Education: N/A   Occupational History  . Not on file.   Social History Main Topics  . Smoking status: Never Smoker   . Smokeless tobacco: Not on file  . Alcohol Use: No  . Drug Use: No  . Sexual Activity: Not on file    Other Topics Concern  . Not on file   Social History Narrative   ** Merged History Encounter **       family history includes Cancer in his mother; Diabetes in his brother; Parkinsonism in his sister; Stroke in his father.  Current facility-administered medications:  .  amoxicillin-clavulanate (AUGMENTIN) 500-125 MG per tablet 500 mg, 1 tablet, Oral, 3 times per day, Thurnell Lose, MD, 500 mg at 09/14/14 1023 .  antiseptic oral rinse (CPC / CETYLPYRIDINIUM CHLORIDE 0.05%) solution 7 mL, 7 mL, Mouth Rinse, BID, Thurnell Lose, MD, 7 mL at 09/14/14 1023 .  benzocaine (HURRICAINE) 20 % mouth spray, , Mouth/Throat, Once, Charlesetta Shanks, MD .  bisacodyl (DULCOLAX) suppository 10 mg, 10 mg, Rectal, Daily, Thurnell Lose, MD, 10 mg at 09/14/14 1059 .  dextrose 5 % and 0.9 % NaCl with KCl 40 mEq/L infusion, , Intravenous, Continuous, Thurnell Lose, MD, Last Rate: 50 mL/hr at 09/14/14 1022 .  hydrALAZINE (APRESOLINE) injection 10 mg, 10 mg, Intravenous, Q6H PRN, Thurnell Lose, MD, 10 mg at 09/14/14 1139 .  lacosamide (VIMPAT) 200 mg in  sodium chloride 0.9 % 25 mL IVPB, 200 mg, Intravenous, Q24H, Thurnell Lose, MD, 200 mg at 09/14/14 1022 .  levETIRAcetam (KEPPRA) IVPB 500 mg/100 mL premix, 500 mg, Intravenous, Q12H, Thurnell Lose, MD, 500 mg at 09/14/14 1059 .  [DISCONTINUED] ondansetron (ZOFRAN) tablet 4 mg, 4 mg, Oral, 4 times per day **OR** ondansetron (ZOFRAN) injection 4 mg, 4 mg, Intravenous, 4 times per day, [name], MD, 4 mg at 09/14/14 1356 .  pantoprazole (PROTONIX) injection 40 mg, 40 mg, Intravenous, Q12H, Thurnell Lose, MD, 40 mg at 09/14/14 1059 Allergies  Allergen Reactions  . Topamax Other (See Comments)    Unknown reaction  . Uroxatral [Alfuzosin Hydrochloride] Other (See Comments)    Unknown reaction  . Valium Other (See Comments)    Unknown reaction     Objective:     BP 161/93 mmHg  Pulse 108  Temp(Src) 99.9 F (37.7 C) (Oral)  Resp 25   Ht 6\' 4"  (1.93 m)  Wt 93 kg (205 lb 0.4 oz)  BMI 24.97 kg/m2  SpO2 92%  He is in no distress  Nonicteric  Heart regular rhythm no murmurs  Lungs clear  Abdomen: Bowel sounds present, soft, nontender  Laboratory No components found for: D1    Assessment:     Intractable vomiting with coffee-ground emesis.  Large hiatal hernia  Multiple other medical problems      Plan:     Continue supportive care with IV fluids PPI therapy and anti-emetics. I will proceed with EGD tomorrow to evaluate the upper GI tract for a source of persistent vomiting and coffee-ground emesis. I spoke to his daughter Manuela Schwartz on the telephone today phone number [phone]. I explained endoscopy. I explained also to the patient. They were agreeable. We will proceed tomorrow.

## 2014-09-14 NOTE — Progress Notes (Signed)
Patient Demographics  Barry Dean, is a 81 y.o. male, DOB - [birth year], YBO:[id number]  Admit date - 09/13/2014   Admitting Physician [name], MD  Outpatient Primary MD for the patient is [name], MD  LOS - 1   Chief Complaint  Patient presents with  . Emesis        Subjective:   Barry Dean today has, No headache, No chest pain, No abdominal pain , No new weakness tingling or numbness, No Cough - SOB.  Positive nausea and vomiting   Assessment & Plan    1. Emesis, persistent - has had few admissions since was similar problems, hiatal hernia on CT scan, question of some blood in his vomitus, likely Mallory-Weiss tear, for now on IV PPI with supportive care, monitor H&H, type and screen, requested GI to see likely will require EGD. Nothing by mouth except medications with IV fluids.   2. Seizure disorder. Placed on IV Keppra and Vimpat.   3. Hypertension. As needed IV hydralazine.   4. Possible aspiration due to ongoing emesis. Chest x-ray stable. Monitor on oral Augmentin.      Code Status: DNR  Family Communication: None Present  Disposition Plan: TBD   Procedures CT scan abdomen and pelvis showing hiatal hernia.   Consults  GI   Medications  Scheduled Meds: . amoxicillin-clavulanate  1 tablet Oral 3 times per day  . antiseptic oral rinse  7 mL Mouth Rinse BID  . benzocaine   Mouth/Throat Once  . bisacodyl  10 mg Rectal Daily  . lacosamide (VIMPAT) IV  200 mg Intravenous Q24H  . levETIRAcetam  500 mg Intravenous Q12H  . ondansetron (ZOFRAN) IV  4 mg Intravenous 4 times per day  . pantoprazole (PROTONIX) IV  40 mg Intravenous Q12H   Continuous Infusions: . dextrose 5 % and 0.9 % NaCl with KCl 40 mEq/L 50 mL/hr at 09/14/14 1022   PRN  Meds:.hydrALAZINE  DVT Prophylaxis    SCDs    Lab Results  Component Value Date   PLT 238 09/14/2014    Antibiotics    Anti-infectives    Start     Dose/Rate Route Frequency Ordered Stop   09/14/14 1000  amoxicillin-clavulanate (AUGMENTIN) 500-125 MG per tablet 500 mg     1 tablet Oral 3 times per day 09/14/14 0841     09/14/14 0800  piperacillin-tazobactam (ZOSYN) IVPB 3.375 g  Status:  Discontinued     3.375 g12.5 mL/hr over 240 Minutes Intravenous Every 8 hours 09/14/14 0204 09/14/14 0819   09/14/14 0300  vancomycin (VANCOCIN) 1,250 mg in sodium chloride 0.9 % 250 mL IVPB  Status:  Discontinued     1,250 mg166.7 mL/hr over 90 Minutes Intravenous Every 12 hours 09/14/14 0204 09/14/14 0807   09/14/14 0215  piperacillin-tazobactam (ZOSYN) IVPB 3.375 g     3.375 g100 mL/hr over 30 Minutes Intravenous  Once 09/14/14 0204 09/14/14 0313          Objective:   Filed Vitals:   09/13/14 2330 09/14/14 0215 09/14/14 0345 09/14/14 0700  BP: 168/87 121/80 158/90   Pulse: 93 107 106   Temp:   100 F (37.8 C) 99.8 F (37.7 C)  TempSrc:   Oral Oral  Resp: 20  20   Height:   6\' 4"  (1.93 m)   Weight:   93 kg (205 lb 0.4 oz)   SpO2: 100% 98% 95%     Wt Readings from Last 3 Encounters:  09/14/14 93 kg (205 lb 0.4 oz)  07/13/14 99.519 kg (219 lb 6.4 oz)  05/06/14 98.431 kg (217 lb)     Intake/Output Summary (Last 24 hours) at 09/14/14 1028 Last data filed at 09/14/14 0700  Gross per 24 hour  Intake    625 ml  Output    550 ml  Net     75 ml     Physical Exam  Awake Alert, Oriented X 3, No new F.N deficits, Normal affect [place].AT,PERRAL Supple Neck,No JVD, No cervical lymphadenopathy appriciated.  Symmetrical Chest wall movement, Good air movement bilaterally, CTAB RRR,No Gallops,Rubs or new Murmurs, No Parasternal Heave +ve B.Sounds, Abd Soft, No tenderness, No organomegaly appriciated, No rebound - guarding or rigidity. No Cyanosis, Clubbing or edema, No new Rash or  bruise      Data Review   Micro Results Recent Results (from the past 240 hour(s))  MRSA PCR Screening     Status: None   Collection Time: 09/14/14  4:37 AM  Result Value Ref Range Status   MRSA by PCR NEGATIVE NEGATIVE Final    Comment:        The GeneXpert MRSA Assay (FDA approved for NASAL specimens only), is one component of a comprehensive MRSA colonization surveillance program. It is not intended to diagnose MRSA infection nor to guide or monitor treatment for MRSA infections.     Radiology Reports Dg Chest 1 View  09/14/2014   CLINICAL DATA:  Vomiting  EXAM: CHEST - 1 VIEW  COMPARISON:  04/30/2014  FINDINGS: There is a large gas containing hiatal hernia, recently characterized by abdominal CT. No definite increased distension. Reticular densities in the lower lungs is chronic and by CT is stable over 1 year. No edema, effusion, or pneumothorax. Unchanged aortic and cardiac contours, distorted by leftward rotation.  IMPRESSION: 1. Large hiatal hernia. 2. Chronic lung disease without acute superimposed finding.   Electronically Signed   By: Jorje Guild M.D.   On: 09/14/2014 00:34   Dg Chest 2 View  09/14/2014   CLINICAL DATA:  Lethargy, aspiration and airway, hypertension, seizures, history melanoma, GERD  EXAM: CHEST  2 VIEW  COMPARISON:  09/14/2014 at 0020 hr  FINDINGS: Normal heart size and pulmonary vascularity.  Atherosclerotic calcification aorta.  Large hiatal hernia.  Chronic basilar interstitial lung disease changes similar to 2015 exams.  LEFT basilar atelectasis.  No definite acute infiltrate, pleural effusion or pneumothorax.  Bones demineralized.  IMPRESSION: Large hiatal hernia.  Bibasilar chronic interstitial lung disease slightly greater on LEFT with accompanying chronic LEFT basilar atelectasis.  No acute abnormalities.   Electronically Signed   By: Lavonia Dana M.D.   On: 09/14/2014 09:56   Ct Abdomen Pelvis W Contrast  09/14/2014   CLINICAL DATA:  Pt ate  dinner and began to vomit over 15 times in 2.5 hrs. No other patrons became sick after eating. Denies diareah. Pt temp 99.1. EMS gave pt 4mg  zophran  EXAM: CT ABDOMEN AND PELVIS WITH CONTRAST  TECHNIQUE: Multidetector CT imaging of the abdomen and pelvis was performed using the standard protocol following bolus administration of intravenous contrast.  CONTRAST:  143mL OMNIPAQUE IOHEXOL 300 MG/ML  SOLN  COMPARISON:  04/25/2014.  FINDINGS: Moderate hiatal hernia, predominately paraesophageal, stable. Stable reticular lung base opacities  most evident in the left lower lobe, consistent with scarring. Heart normal in size.  Liver and spleen: Unremarkable. Vague density in the dependent gallbladder measuring 16 mm. This may reflect a in mildly opaque stone. No wall thickening. No acute cholecystitis. No bile duct dilation. Unremarkable pancreas.  No adrenal masses. 12 mm low-density area along the medial lower pole of the left kidney most consistent with a cyst. No other renal masses, no stones and no hydronephrosis. Normal ureters. Unremarkable bladder.  No adenopathy.  No abnormal fluid collections.  Mild increased stool in the colon. No colonic wall thickening or inflammatory changes. Small bowel is unremarkable. Appendix not visualized. No acute appendicitis.  Degenerative changes throughout the visualized spine. Bones are demineralized. No osteoblastic or osteolytic lesions.  IMPRESSION: 1. No acute findings. 2. Moderate hiatal hernia, paraesophageal, stable. 3. Chronic lung base scarring. 4. Mild increased stool in the colon. 5. Probable minimally opaque gallstone.  No acute cholecystitis. 6. Significant degenerative changes throughout the visualized spine.   Electronically Signed   By: Lajean Manes M.D.   On: 09/14/2014 00:02   Dg Abd Portable 1v  09/14/2014   CLINICAL DATA:  Nausea and vomiting  EXAM: PORTABLE ABDOMEN - 1 VIEW  COMPARISON:  CT abdomen pelvis 09/13/2014  FINDINGS: Moderate retained stool in the  colon. Negative for bowel obstruction or ileus. Lumbar scoliosis. Urinary bladder is filled with contrast from recent CT scan and intravenous contrast injection.  IMPRESSION: Constipation without bowel obstruction or ileus.   Electronically Signed   By: Franchot Gallo M.D.   On: 09/14/2014 07:02     CBC  Recent Labs Lab 09/13/14 2200 09/14/14 0535  WBC 8.1 8.2  HGB 15.0 14.5  HCT 42.3 41.2  PLT 238 238  MCV 90.8 93.0  MCH 32.2 32.7  MCHC 35.5 35.2  RDW 13.5 13.4  LYMPHSABS 1.7 1.8  MONOABS 0.7 0.6  EOSABS 0.1 0.0  BASOSABS 0.0 0.0    Chemistries   Recent Labs Lab 09/13/14 2200 09/14/14 0535  NA 133* 134*  K 3.9 3.5  CL 94* 98  CO2 30 25  GLUCOSE 109* 105*  BUN 11 10  CREATININE 0.89 0.98  CALCIUM 9.7 9.0  AST 24 24  ALT 19 18  ALKPHOS 71 66  BILITOT 0.9 1.5*   ------------------------------------------------------------------------------------------------------------------ estimated creatinine clearance is 64 mL/min (by C-G formula based on Cr of 0.98). ------------------------------------------------------------------------------------------------------------------ No results for input(s): HGBA1C in the last 72 hours. ------------------------------------------------------------------------------------------------------------------ No results for input(s): CHOL, HDL, LDLCALC, TRIG, CHOLHDL, LDLDIRECT in the last 72 hours. ------------------------------------------------------------------------------------------------------------------ No results for input(s): TSH, T4TOTAL, T3FREE, THYROIDAB in the last 72 hours.  Invalid input(s): FREET3 ------------------------------------------------------------------------------------------------------------------ No results for input(s): VITAMINB12, FOLATE, FERRITIN, TIBC, IRON, RETICCTPCT in the last 72 hours.  Coagulation profile  Recent Labs Lab 09/13/14 2200  INR 0.97    No results for input(s): DDIMER in the  last 72 hours.  Cardiac Enzymes  Recent Labs Lab 09/13/14 2200  TROPONINI <0.03   ------------------------------------------------------------------------------------------------------------------ Invalid input(s): POCBNP     Time Spent in minutes   35   SINGH,PRASHANT K M.D on 09/14/2014 at 10:28 AM  Between 7am to 7pm - Pager - [phone]  After 7pm go to [email or web address] - [facility] Office  [phone]

## 2014-09-14 NOTE — Evaluation (Signed)
Physical Therapy Evaluation Patient Details Name: Barry Dean MRN: [id number] DOB: Oct 24, 1923 Today's Date: 09/14/2014   History of Present Illness  pt presents with emesis and found to have ileus.    Clinical Impression  Pt generally weak and debilitated, but mobility most limited by repeated emesis.  Feel as GI issues resolve pt will be able to participate better with therapy, but likely will still need SNF for rehab prior to returning to ALF.  Will continue to follow.      Follow Up Recommendations SNF    Equipment Recommendations   (TBD)    Recommendations for Other Services       Precautions / Restrictions Precautions Precautions: Fall Precaution Comments: pt vomiting multiple times during session.   Restrictions Weight Bearing Restrictions: No      Mobility  Bed Mobility Overal bed mobility: Needs Assistance Bed Mobility: Supine to Sit;Sit to Supine     Supine to sit: Mod assist;HOB elevated Sit to supine: Mod assist;+2 for physical assistance   General bed mobility comments: cues and increased time needed.  pt with coughing and emesis once brought to sitting position.    Transfers                    Ambulation/Gait                Stairs            Wheelchair Mobility    Modified Rankin (Stroke Patients Only)       Balance Overall balance assessment: Needs assistance Sitting-balance support: Single extremity supported;Feet supported Sitting balance-Leahy Scale: Poor Sitting balance - Comments: pt leans anteriorly on single UE for support, but question if this is more due to emesis and positioning for comfort vs true balance deficit.                                       Pertinent Vitals/Pain Pain Assessment: Faces Faces Pain Scale: Hurts even more Pain Location: Abdomen worse with vomiting Pain Descriptors / Indicators: Grimacing;Cramping;Pressure Pain Intervention(s): Monitored during session;Premedicated  before session;Repositioned    Home Living Family/patient expects to be discharged to:: Skilled nursing facility                 Additional Comments: pt from ALF    Prior Function Level of Independence: Needs assistance   Gait / Transfers Assistance Needed: requires elevated surfaces to stand, uses rollator, tends to have supervision with gait outside of his apt  ADL's / Homemaking Assistance Needed: staff assists with bathing and dressing and performs all housework. Patient able to perform grooming task of putting dentures in/out        Hand Dominance   Dominant Hand: Left    Extremity/Trunk Assessment   Upper Extremity Assessment: Overall WFL for tasks assessed           Lower Extremity Assessment: Generalized weakness      Cervical / Trunk Assessment: Kyphotic  Communication   Communication: HOH  Cognition Arousal/Alertness: Awake/alert Behavior During Therapy: WFL for tasks assessed/performed Overall Cognitive Status: Within Functional Limits for tasks assessed                      General Comments      Exercises        Assessment/Plan    PT Assessment Patient needs continued PT services  PT Diagnosis  Difficulty walking;Generalized weakness   PT Problem List Decreased strength;Decreased activity tolerance;Decreased balance;Decreased mobility;Decreased knowledge of use of DME;Pain  PT Treatment Interventions DME instruction;Gait training;Functional mobility training;Therapeutic activities;Therapeutic exercise;Balance training;Patient/family education   PT Goals (Current goals can be found in the Care Plan section) Acute Rehab PT Goals Patient Stated Goal: Unable to state with repeated emesis.   PT Goal Formulation: With patient Time For Goal Achievement: 09/28/14 Potential to Achieve Goals: Good    Frequency Min 2X/week   Barriers to discharge        Co-evaluation               End of Session Equipment Utilized During  Treatment: Oxygen Activity Tolerance: Other (comment) (Limited by emesis) Patient left: in bed;with call bell/phone within reach Nurse Communication: Mobility status (Emesis)         Time: 2992-4268 PT Time Calculation (min) (ACUTE ONLY): 34 min   Charges:   PT Evaluation $Initial PT Evaluation Tier I: 1 Procedure PT Treatments $Therapeutic Activity: 8-22 mins   PT G CodesCatarina Hartshorn, [place] [phone] 09/14/2014, 3:51 PM

## 2014-09-14 NOTE — Progress Notes (Signed)
Utilization Review Completed.Barry Dean T1/19/2016  

## 2014-09-15 ENCOUNTER — Encounter (HOSPITAL_COMMUNITY): Payer: Self-pay | Admitting: *Deleted

## 2014-09-15 ENCOUNTER — Inpatient Hospital Stay (HOSPITAL_COMMUNITY): Payer: Medicare Other | Admitting: Anesthesiology

## 2014-09-15 ENCOUNTER — Encounter (HOSPITAL_COMMUNITY)
Admission: EM | Disposition: A | Discharge status: Skilled Nursing Facility | Payer: Self-pay | Source: Home / Self Care | Attending: Internal Medicine

## 2014-09-15 HISTORY — PX: ESOPHAGOGASTRODUODENOSCOPY (EGD) WITH PROPOFOL: SHX5813

## 2014-09-15 LAB — COMPREHENSIVE METABOLIC PANEL
ALT: 15 U/L (ref 0–53)
ANION GAP: 3 — AB (ref 5–15)
AST: 20 U/L (ref 0–37)
Albumin: 3.3 g/dL — ABNORMAL LOW (ref 3.5–5.2)
Alkaline Phosphatase: 54 U/L (ref 39–117)
BILIRUBIN TOTAL: 1.3 mg/dL — AB (ref 0.3–1.2)
BUN: 19 mg/dL (ref 6–23)
CHLORIDE: 101 meq/L (ref 96–112)
CO2: 32 mmol/L (ref 19–32)
CREATININE: 0.97 mg/dL (ref 0.50–1.35)
Calcium: 8.9 mg/dL (ref 8.4–10.5)
GFR calc Af Amer: 83 mL/min — ABNORMAL LOW (ref >90)
GFR, EST NON AFRICAN AMERICAN: 72 mL/min — AB (ref >90)
GLUCOSE: 132 mg/dL — AB (ref 70–99)
Potassium: 3.5 mmol/L (ref 3.5–5.1)
Sodium: 136 mmol/L (ref 135–145)
Total Protein: 6 g/dL (ref 6.0–8.3)

## 2014-09-15 LAB — CBC WITH DIFFERENTIAL/PLATELET
BASOS ABS: 0 10*3/uL (ref 0.0–0.1)
Basophils Relative: 0 % (ref 0–1)
Eosinophils Absolute: 0.1 10*3/uL (ref 0.0–0.7)
Eosinophils Relative: 1 % (ref 0–5)
HEMATOCRIT: 36.2 % — AB (ref 39.0–52.0)
HEMOGLOBIN: 12.3 g/dL — AB (ref 13.0–17.0)
LYMPHS PCT: 15 % (ref 12–46)
Lymphs Abs: 1.6 10*3/uL (ref 0.7–4.0)
MCH: 32.1 pg (ref 26.0–34.0)
MCHC: 34 g/dL (ref 30.0–36.0)
MCV: 94.5 fL (ref 78.0–100.0)
MONO ABS: 0.8 10*3/uL (ref 0.1–1.0)
Monocytes Relative: 8 % (ref 3–12)
Neutro Abs: 8.2 10*3/uL — ABNORMAL HIGH (ref 1.7–7.7)
Neutrophils Relative %: 76 % (ref 43–77)
Platelets: 216 10*3/uL (ref 150–400)
RBC: 3.83 MIL/uL — ABNORMAL LOW (ref 4.22–5.81)
RDW: 13.7 % (ref 11.5–15.5)
WBC: 10.7 10*3/uL — AB (ref 4.0–10.5)

## 2014-09-15 LAB — HEMOGLOBIN AND HEMATOCRIT, BLOOD
HCT: 35.1 % — ABNORMAL LOW (ref 39.0–52.0)
HCT: 34 % — ABNORMAL LOW (ref 39.0–52.0)
HEMOGLOBIN: 11.7 g/dL — AB (ref 13.0–17.0)
Hemoglobin: 11.9 g/dL — ABNORMAL LOW (ref 13.0–17.0)

## 2014-09-15 LAB — URINE CULTURE
COLONY COUNT: NO GROWTH
Culture: NO GROWTH

## 2014-09-15 LAB — PROCALCITONIN: Procalcitonin: <0.1 ng/mL

## 2014-09-15 SURGERY — ESOPHAGOGASTRODUODENOSCOPY (EGD) WITH PROPOFOL
Anesthesia: Monitor Anesthesia Care

## 2014-09-15 MED ORDER — PROPOFOL INFUSION 10 MG/ML OPTIME
INTRAVENOUS | Status: DC | PRN
  Administered 2014-09-15: 50 ug/kg/min via INTRAVENOUS

## 2014-09-15 MED ORDER — PIPERACILLIN-TAZOBACTAM 3.375 G IVPB
3.3750 g | Freq: Three times a day (TID) | INTRAVENOUS | Status: DC
Start: 2014-09-15 — End: 2014-09-16
  Administered 2014-09-15 – 2014-09-16 (×3): 3.375 g via INTRAVENOUS
  Filled 2014-09-15 (×5): qty 50

## 2014-09-15 MED ORDER — MEPERIDINE HCL 25 MG/ML IJ SOLN: 6.2500 mg | INTRAMUSCULAR | Status: DC | PRN

## 2014-09-15 MED ORDER — KCL IN DEXTROSE-NACL 40-5-0.9 MEQ/L-%-% IV SOLN
INTRAVENOUS | Status: DC
  Administered 2014-09-15: 10:00:00 via INTRAVENOUS
  Filled 2014-09-15 (×2): qty 1000

## 2014-09-15 MED ORDER — LACTATED RINGERS IV SOLN
INTRAVENOUS | Status: DC | PRN
  Administered 2014-09-15: 13:00:00 via INTRAVENOUS

## 2014-09-15 MED ORDER — PROMETHAZINE HCL 25 MG/ML IJ SOLN: 6.2500 mg | INTRAMUSCULAR | Status: DC | PRN

## 2014-09-15 MED ORDER — LACTATED RINGERS IV SOLN
INTRAVENOUS | Status: DC
  Administered 2014-09-15: 1000 mL via INTRAVENOUS

## 2014-09-15 MED ORDER — PROPOFOL 10 MG/ML IV BOLUS
INTRAVENOUS | Status: DC | PRN
  Administered 2014-09-15: 30 mg via INTRAVENOUS

## 2014-09-15 MED ORDER — VANCOMYCIN HCL IN DEXTROSE 1-5 GM/200ML-% IV SOLN
1000.0000 mg | Freq: Two times a day (BID) | INTRAVENOUS | Status: DC
  Administered 2014-09-15 – 2014-09-17 (×6): 1000 mg via INTRAVENOUS
  Filled 2014-09-15 (×7): qty 200

## 2014-09-15 MED ORDER — FENTANYL CITRATE 0.05 MG/ML IJ SOLN: 25.0000 ug | INTRAMUSCULAR | Status: DC | PRN

## 2014-09-15 NOTE — Anesthesia Preprocedure Evaluation (Addendum)
Anesthesia Evaluation  Patient identified by MRN, date of birth, ID band Patient awake    Reviewed: Allergy & Precautions, NPO status , Patient's Chart, lab work & pertinent test results, reviewed documented beta blocker date and time   Airway Mallampati: II   Neck ROM: Full    Dental  (+) Poor Dentition   Pulmonary  Question of aspiration with persistent vomiting, CXR with large hiatal hernia, no significant infiltrates breath sounds clear to auscultation        Cardiovascular hypertension, Pt. on medications Rhythm:Irregular  ECHO 2012 EF 55%   Neuro/Psych Seizures -,  Anxiety Depression    GI/Hepatic Neg liver ROS, GERD-  Medicated,Has been having persistent vomiting, possibly some blood, H/H relatively stable   Endo/Other  negative endocrine ROS  Renal/GU negative Renal ROS     Musculoskeletal   Abdominal (+)  Abdomen: soft.    Peds  Hematology 12/36 H/H   Anesthesia Other Findings Metal bar front lower, missing many teeth, small mouth opening  Reproductive/Obstetrics                          Anesthesia Physical Anesthesia Plan  ASA: III  Anesthesia Plan: MAC   Post-op Pain Management:    Induction: Intravenous  Airway Management Planned: Nasal Cannula  Additional Equipment:   Intra-op Plan:   Post-operative Plan:   Informed Consent: I have reviewed the patients History and Physical, chart, labs and discussed the procedure including the risks, benefits and alternatives for the proposed anesthesia with the patient or authorized representative who has indicated his/her understanding and acceptance.     Plan Discussed with:   Anesthesia Plan Comments:         Anesthesia Quick Evaluation

## 2014-09-15 NOTE — Progress Notes (Signed)
Report called to Wells Guiles, RN on 6east. Pt's VSS, due meds given, no c/o pain. Personal hearing aids, glasses and other belongings with pt. Pt's daughter aware of transfer. CCMD and Elink notified, monitor removed. Pt transferred to 6E23.

## 2014-09-15 NOTE — Progress Notes (Signed)
Patient Demographics  Barry Dean, is a 81 y.o. male, DOB - 03-Feb-1924, TTS:[id number]  Admit date - 09/13/2014   Admitting Physician Barry Howells, MD  Outpatient Primary MD for the patient is Barry Argyle, MD  LOS - 2   Chief Complaint  Patient presents with  . Emesis        Subjective:   Barry Dean today has, No headache, No chest pain, No abdominal pain , No new weakness tingling or numbness, No Cough - SOB.  Positive nausea and vomiting   Assessment & Plan    1. Emesis, persistent - has had few admissions since was similar problems, hiatal hernia on CT scan, question of some blood in his vomitus, better N&V with NG, for now on IV PPI & supportive care, monitor H&H which remains stable when accounted for dilution, type and screen done, requested GI to see likely will require EGD. Nothing by mouth except medications with IV fluids.   2. Seizure disorder. Placed on IV Keppra and Vimpat.   3. Hypertension. As needed IV hydralazine.   4. Possible aspiration due to ongoing emesis. Chest x-ray stable. Monitor.   5. One out of 2 blood cultures positive for gram positive cocci in clusters. Question if this is a contaminant. Currently on vancomycin, since there is a question of aspiration with mild leukocytosis I will add Zosyn.      Code Status: DNR  Family Communication: None Present  Disposition Plan: SNF  Procedures   CT scan abdomen and pelvis showing hiatal hernia.  Due for EGD by GI physician Dr. Penelope Dean on 09/15/2014   Consults  [facility]   Medications  Scheduled Meds: . antiseptic oral rinse  7 mL Mouth Rinse BID  . benzocaine   Mouth/Throat Once  . bisacodyl  10 mg Rectal Daily  . lacosamide (VIMPAT) IV  200 mg Intravenous Q24H  . levETIRAcetam  500  mg Intravenous Q12H  . ondansetron (ZOFRAN) IV  4 mg Intravenous 4 times per day  . pantoprazole (PROTONIX) IV  40 mg Intravenous Q12H  . vancomycin  1,000 mg Intravenous Q12H   Continuous Infusions: . dextrose 5 % and 0.9 % NaCl with KCl 40 mEq/L     PRN Meds:.hydrALAZINE, promethazine  DVT Prophylaxis    SCDs    Lab Results  Component Value Date   PLT 216 09/15/2014    Antibiotics    Anti-infectives    Start     Dose/Rate Route Frequency Ordered Stop   09/15/14 0100  vancomycin (VANCOCIN) IVPB 1000 mg/200 mL premix     1,000 mg200 mL/hr over 60 Minutes Intravenous Every 12 hours 09/15/14 0036     09/14/14 1000  amoxicillin-clavulanate (AUGMENTIN) 500-125 MG per tablet 500 mg  Status:  Discontinued     1 tablet Oral 3 times per day 09/14/14 0841 09/14/14 1703   09/14/14 0800  piperacillin-tazobactam (ZOSYN) IVPB 3.375 g  Status:  Discontinued     3.375 g12.5 mL/hr over 240 Minutes Intravenous Every 8 hours 09/14/14 0204 09/14/14 0819   09/14/14 0300  vancomycin (VANCOCIN) 1,250 mg in sodium chloride 0.9 % 250 mL IVPB  Status:  Discontinued     1,250 mg166.7 mL/hr over 90 Minutes Intravenous Every 12 hours 09/14/14  1610 09/14/14 0807   09/14/14 0215  piperacillin-tazobactam (ZOSYN) IVPB 3.375 g     3.375 g100 mL/hr over 30 Minutes Intravenous  Once 09/14/14 0204 09/14/14 0313          Objective:   Filed Vitals:   09/14/14 2319 09/15/14 0408 09/15/14 0413 09/15/14 0700  BP: 129/64 163/79    Pulse: 91 95    Temp:   98.6 F (37 C) 99 F (37.2 C)  TempSrc:   Oral Oral  Resp: 12 38    Height:      Weight:      SpO2: 95% 97%      Wt Readings from Last 3 Encounters:  09/14/14 93 kg (205 lb 0.4 oz)  07/13/14 99.519 kg (219 lb 6.4 oz)  05/06/14 98.431 kg (217 lb)     Intake/Output Summary (Last 24 hours) at 09/15/14 0912 Last data filed at 09/15/14 0600  Gross per 24 hour  Intake 881.67 ml  Output   1000 ml  Net -118.33 ml     Physical Exam  Awake Alert,  Oriented X 3, No new F.N deficits, Normal affect [place].AT,PERRAL Supple Neck,No JVD, No cervical lymphadenopathy appriciated.  Symmetrical Chest wall movement, Good air movement bilaterally, CTAB RRR,No Gallops,Rubs or new Murmurs, No Parasternal Heave +ve B.Sounds, Abd Soft, No tenderness, No organomegaly appriciated, No rebound - guarding or rigidity. NG in place No Cyanosis, Clubbing or edema, No new Rash or bruise      Data Review   Micro Results Recent Results (from the past 240 hour(s))  Culture, blood (routine x 2)     Status: None (Preliminary result)   Collection Time: 09/13/14 10:00 PM  Result Value Ref Range Status   Specimen Description BLOOD HAND LEFT  Final   Special Requests BOTTLES DRAWN AEROBIC AND ANAEROBIC 5CC  Final   Culture   Final    GRAM POSITIVE COCCI IN PAIRS AND CHAINS Note: Gram Stain Report Called to,Read Back By and Verified With: GABE SANTONELLA ON 1.19.2016 AT 9:28P BY WILEJ Performed at [facility]    Report Status PENDING  Incomplete  Culture, blood (routine x 2)     Status: None (Preliminary result)   Collection Time: 09/13/14 10:19 PM  Result Value Ref Range Status   Specimen Description BLOOD ARM RIGHT  Final   Special Requests BOTTLES DRAWN AEROBIC AND ANAEROBIC 3CC EA  Final   Culture   Final           BLOOD CULTURE RECEIVED NO GROWTH TO DATE CULTURE WILL BE HELD FOR 5 DAYS BEFORE ISSUING A FINAL NEGATIVE REPORT Performed at [facility]    Report Status PENDING  Incomplete  MRSA PCR Screening     Status: None   Collection Time: 09/14/14  4:37 AM  Result Value Ref Range Status   MRSA by PCR NEGATIVE NEGATIVE Final    Comment:        The GeneXpert MRSA Assay (FDA approved for NASAL specimens only), is one component of a comprehensive MRSA colonization surveillance program. It is not intended to diagnose MRSA infection nor to guide or monitor treatment for MRSA infections.     Radiology Reports Dg Chest 1  View  09/14/2014   CLINICAL DATA:  Vomiting  EXAM: CHEST - 1 VIEW  COMPARISON:  04/30/2014  FINDINGS: There is a large gas containing hiatal hernia, recently characterized by abdominal CT. No definite increased distension. Reticular densities in the lower lungs is chronic and by CT  is stable over 1 year. No edema, effusion, or pneumothorax. Unchanged aortic and cardiac contours, distorted by leftward rotation.  IMPRESSION: 1. Large hiatal hernia. 2. Chronic lung disease without acute superimposed finding.   Electronically Signed   By: Jorje Guild M.D.   On: 09/14/2014 00:34   Dg Chest 2 View  09/14/2014   CLINICAL DATA:  Lethargy, aspiration and airway, hypertension, seizures, history melanoma, GERD  EXAM: CHEST  2 VIEW  COMPARISON:  09/14/2014 at 0020 hr  FINDINGS: Normal heart size and pulmonary vascularity.  Atherosclerotic calcification aorta.  Large hiatal hernia.  Chronic basilar interstitial lung disease changes similar to 2015 exams.  LEFT basilar atelectasis.  No definite acute infiltrate, pleural effusion or pneumothorax.  Bones demineralized.  IMPRESSION: Large hiatal hernia.  Bibasilar chronic interstitial lung disease slightly greater on LEFT with accompanying chronic LEFT basilar atelectasis.  No acute abnormalities.   Electronically Signed   By: Lavonia Dana M.D.   On: 09/14/2014 09:56   Ct Abdomen Pelvis W Contrast  09/14/2014   CLINICAL DATA:  Pt ate dinner and began to vomit over 15 times in 2.5 hrs. No other patrons became sick after eating. Denies diareah. Pt temp 99.1. EMS gave pt 4mg  zophran  EXAM: CT ABDOMEN AND PELVIS WITH CONTRAST  TECHNIQUE: Multidetector CT imaging of the abdomen and pelvis was performed using the standard protocol following bolus administration of intravenous contrast.  CONTRAST:  164mL OMNIPAQUE IOHEXOL 300 MG/ML  SOLN  COMPARISON:  04/25/2014.  FINDINGS: Moderate hiatal hernia, predominately paraesophageal, stable. Stable reticular lung base opacities most  evident in the left lower lobe, consistent with scarring. Heart normal in size.  Liver and spleen: Unremarkable. Vague density in the dependent gallbladder measuring 16 mm. This may reflect a in mildly opaque stone. No wall thickening. No acute cholecystitis. No bile duct dilation. Unremarkable pancreas.  No adrenal masses. 12 mm low-density area along the medial lower pole of the left kidney most consistent with a cyst. No other renal masses, no stones and no hydronephrosis. Normal ureters. Unremarkable bladder.  No adenopathy.  No abnormal fluid collections.  Mild increased stool in the colon. No colonic wall thickening or inflammatory changes. Small bowel is unremarkable. Appendix not visualized. No acute appendicitis.  Degenerative changes throughout the visualized spine. Bones are demineralized. No osteoblastic or osteolytic lesions.  IMPRESSION: 1. No acute findings. 2. Moderate hiatal hernia, paraesophageal, stable. 3. Chronic lung base scarring. 4. Mild increased stool in the colon. 5. Probable minimally opaque gallstone.  No acute cholecystitis. 6. Significant degenerative changes throughout the visualized spine.   Electronically Signed   By: Lajean Manes M.D.   On: 09/14/2014 00:02   Dg Abd Portable 1v  09/14/2014   CLINICAL DATA:  Vomiting, diarrhea and weakness.  EXAM: PORTABLE ABDOMEN - 1 VIEW  COMPARISON:  Abdominal films earlier today at 0630 hr  FINDINGS: No evidence of bowel obstruction significant ileus. Similar pattern of retained fecal material in the colon. Excreted contrast is again identified in the urinary bladder. Degenerative changes are present in the lower lumbar spine.  IMPRESSION: No evidence of bowel obstruction or significant ileus.   Electronically Signed   By: Aletta Edouard M.D.   On: 09/14/2014 20:26   Dg Abd Portable 1v  09/14/2014   CLINICAL DATA:  Nausea and vomiting  EXAM: PORTABLE ABDOMEN - 1 VIEW  COMPARISON:  CT abdomen pelvis 09/13/2014  FINDINGS: Moderate retained  stool in the colon. Negative for bowel obstruction or ileus. Lumbar scoliosis.  Urinary bladder is filled with contrast from recent CT scan and intravenous contrast injection.  IMPRESSION: Constipation without bowel obstruction or ileus.   Electronically Signed   By: Franchot Gallo M.D.   On: 09/14/2014 07:02     CBC  Recent Labs Lab 09/13/14 2200 09/14/14 0535 09/14/14 1036 09/14/14 1505 09/14/14 2044 09/15/14 0255  WBC 8.1 8.2  --   --   --  10.7*  HGB 15.0 14.5 13.7 13.4 13.3 12.3*  HCT 42.3 41.2 39.3 38.5* 38.0* 36.2*  PLT 238 238  --   --   --  216  MCV 90.8 93.0  --   --   --  94.5  MCH 32.2 32.7  --   --   --  32.1  MCHC 35.5 35.2  --   --   --  34.0  RDW 13.5 13.4  --   --   --  13.7  LYMPHSABS 1.7 1.8  --   --   --  1.6  MONOABS 0.7 0.6  --   --   --  0.8  EOSABS 0.1 0.0  --   --   --  0.1  BASOSABS 0.0 0.0  --   --   --  0.0    Chemistries   Recent Labs Lab 09/13/14 2200 09/14/14 0535 09/15/14 0255  NA 133* 134* 136  K 3.9 3.5 3.5  CL 94* 98 101  CO2 30 25 32  GLUCOSE 109* 105* 132*  BUN 11 10 19   CREATININE 0.89 0.98 0.97  CALCIUM 9.7 9.0 8.9  AST 24 24 20   ALT 19 18 15   ALKPHOS 71 66 54  BILITOT 0.9 1.5* 1.3*   ------------------------------------------------------------------------------------------------------------------ estimated creatinine clearance is 64.6 mL/min (by C-G formula based on Cr of 0.97). ------------------------------------------------------------------------------------------------------------------ No results for input(s): HGBA1C in the last 72 hours. ------------------------------------------------------------------------------------------------------------------ No results for input(s): CHOL, HDL, LDLCALC, TRIG, CHOLHDL, LDLDIRECT in the last 72 hours. ------------------------------------------------------------------------------------------------------------------ No results for input(s): TSH, T4TOTAL, T3FREE, THYROIDAB in the  last 72 hours.  Invalid input(s): FREET3 ------------------------------------------------------------------------------------------------------------------ No results for input(s): VITAMINB12, FOLATE, FERRITIN, TIBC, IRON, RETICCTPCT in the last 72 hours.  Coagulation profile  Recent Labs Lab 09/13/14 2200  INR 0.97    No results for input(s): DDIMER in the last 72 hours.  Cardiac Enzymes  Recent Labs Lab 09/13/14 2200  TROPONINI <0.03   ------------------------------------------------------------------------------------------------------------------ Invalid input(s): POCBNP     Time Spent in minutes   35   Verlon Pischke K M.D on 09/15/2014 at 9:12 AM  Between 7am to 7pm - Pager - [phone]  After 7pm go to [email or web address] - [facility] Office  [phone]

## 2014-09-15 NOTE — Progress Notes (Signed)
Patient transferred from [place].

## 2014-09-15 NOTE — Anesthesia Postprocedure Evaluation (Signed)
  Anesthesia Post-op Note  Patient: Barry Dean  Procedure(s) Performed: Procedure(s): ESOPHAGOGASTRODUODENOSCOPY (EGD) WITH PROPOFOL (N/A)  Patient Location: PACU  Anesthesia Type:MAC  Level of Consciousness: awake and alert   Airway and Oxygen Therapy: Patient Spontanous Breathing and Patient connected to nasal cannula oxygen  Post-op Pain: none  Post-op Assessment: Post-op Vital signs reviewed  Post-op Vital Signs: Reviewed  Last Vitals:  Filed Vitals:   09/15/14 1405  BP: 145/65  Pulse: 91  Temp:   Resp: 17    Complications: No apparent anesthesia complications

## 2014-09-15 NOTE — Progress Notes (Signed)
ANTIBIOTIC CONSULT NOTE - INITIAL  Pharmacy Consult for Vanco/Zosyn Indication: GPC bacteremia and aspiration PNA  Allergies  Allergen Reactions  . Topamax Other (See Comments)    Unknown reaction  . Uroxatral [Alfuzosin Hydrochloride] Other (See Comments)    Unknown reaction  . Valium Other (See Comments)    Unknown reaction    Patient Measurements: Height: 6\' 4"  (193 cm) Weight: 205 lb 0.4 oz (93 kg) IBW/kg (Calculated) : 86.8 Adjusted Body Weight:    Vital Signs: Temp: 99 F (37.2 C) (01/20 0700) Temp Source: Oral (01/20 0700) BP: 163/79 mmHg (01/20 0408) Pulse Rate: 95 (01/20 0408) Intake/Output from previous day: 01/19 0701 - 01/20 0700 In: 881.7 [I.V.:881.7] Out: 1000 [Emesis/NG output:1000] Intake/Output from this shift:    Labs:  Recent Labs  09/13/14 2200 09/14/14 0535  09/14/14 1505 09/14/14 2044 09/15/14 0255  WBC 8.1 8.2  --   --   --  10.7*  HGB 15.0 14.5  < > 13.4 13.3 12.3*  PLT 238 238  --   --   --  216  CREATININE 0.89 0.98  --   --   --  0.97  < > = values in this interval not displayed. Estimated Creatinine Clearance: 64.6 mL/min (by C-G formula based on Cr of 0.97). No results for input(s): VANCOTROUGH, VANCOPEAK, VANCORANDOM, GENTTROUGH, GENTPEAK, GENTRANDOM, TOBRATROUGH, TOBRAPEAK, TOBRARND, AMIKACINPEAK, AMIKACINTROU, AMIKACIN in the last 72 hours.   Microbiology: Recent Results (from the past 720 hour(s))  Culture, blood (routine x 2)     Status: None (Preliminary result)   Collection Time: 09/13/14 10:00 PM  Result Value Ref Range Status   Specimen Description BLOOD HAND LEFT  Final   Special Requests BOTTLES DRAWN AEROBIC AND ANAEROBIC 5CC  Final   Culture   Final    GRAM POSITIVE COCCI IN PAIRS AND CHAINS Note: Gram Stain Report Called to,Read Back By and Verified With: GABE SANTONELLA ON 1.19.2016 AT 9:28P BY WILEJ Performed at [facility]    Report Status PENDING  Incomplete  Culture, blood (routine x 2)      Status: None (Preliminary result)   Collection Time: 09/13/14 10:19 PM  Result Value Ref Range Status   Specimen Description BLOOD ARM RIGHT  Final   Special Requests BOTTLES DRAWN AEROBIC AND ANAEROBIC 3CC EA  Final   Culture   Final           BLOOD CULTURE RECEIVED NO GROWTH TO DATE CULTURE WILL BE HELD FOR 5 DAYS BEFORE ISSUING A FINAL NEGATIVE REPORT Performed at [facility]    Report Status PENDING  Incomplete  MRSA PCR Screening     Status: None   Collection Time: 09/14/14  4:37 AM  Result Value Ref Range Status   MRSA by PCR NEGATIVE NEGATIVE Final    Comment:        The GeneXpert MRSA Assay (FDA approved for NASAL specimens only), is one component of a comprehensive MRSA colonization surveillance program. It is not intended to diagnose MRSA infection nor to guide or monitor treatment for MRSA infections.     Medical History: Past Medical History  Diagnosis Date  . Hypertension   . Seizures   . A-fib   . Dehydration   . Deafness   . Anxiety   . Reflux   . Arthritis   . Depression   . Atrial fibrillation   . Tremor, essential   . Seizure   . BPH (benign prostatic hyperplasia)   . Syncope   .  Deaf   . Melanoma   . GERD (gastroesophageal reflux disease)   . Mitral regurgitation   . Abnormality of gait     Assessment: 81yo male 09/13/2014 c/o vomiting every 15 minutes since eating dinner and continues to vomit coffee-ground emesis after tx w/ Zofran and Phenergan in ED, concern for sepsis, to begin IV ABX.  Anticoagulation: SCDs Infectious Disease: Tmax 99.9. WBC 10.7 up. Now with GPC in BC x 1. Vanco 1/19>> Zosyn 1/20>> Augmentin 1/19 x 1 only  1/18: BC x 2: GPC x 1  Cardiovascular: HTN, Afib, BP 163/79, HR 95. No CV meds currently. Endocrinology: Glucose 132 Gastrointestinal / Nutrition: h/o large HH. GERD, IV Protonix Neurology: Seizures, anxiety, depression, tremor IV Vimpat, IV Keppra Nephrology: BPH, Scr 0.97 Pulmonary Hematology /  Oncology: h/o melanoma, Hgb 12.3 PTA Medication Issues Best Practices  Goal of Therapy:  Vancomycin trough level 15-20 mcg/ml  Plan:  Plan: Vanco 1g IV q12h Zosyn 3.375g IV q8hr. Plan EGD today  Gennett Garcia S. Alford Highland, PharmD, BCPS Clinical Staff Pharmacist Pager [phone]   Eilene Ghazi Stillinger 09/15/2014,9:22 AM

## 2014-09-15 NOTE — Op Note (Signed)
[facility] [place], [address]   ENDOSCOPY PROCEDURE REPORT  PATIENT: Barry, Dean  MR#: [id number] BIRTHDATE: Jun 11, 1924 , 89  yrs. old GENDER: male ENDOSCOPIST: Acquanetta Sit, MD REFERRED BY: PROCEDURE DATE:  October 09, 2014 PROCEDURE:  EGD ASA CLASS:     3 INDICATIONS:  persistent vomiting and coffee-ground emesis MEDICATIONS: propofol per anesthesia TOPICAL ANESTHETIC:  DESCRIPTION OF PROCEDURE: After the risks benefits and alternatives of the procedure were thoroughly explained, informed consent was obtained.  The Pentax Gastroscope [id number] endoscope was introduced through the mouth and advanced to the second portion of the duodenum , Without limitations.  The instrument was slowly withdrawn as the mucosa was fully examined.  Findings:  Esophagus: Normal. NG tube was seen going down the esophagus.  Stomach: Large hiatal hernia with NG tube coiled in the hiatal hernia. Gastritis within the hiatal hernia. Mild erythema in the gastric body. No signs of significant bleeding. Some scattered coffee grounds present.  Duodenum: Normal    The scope was then withdrawn from the patient and the procedure completed.  COMPLICATIONS: There were no immediate complications.  ENDOSCOPIC IMPRESSION:see above   RECOMMENDATIONS:continue PPI therapy and anti-emetics. There is nothing further from a GI standpoint that I can recommend. We will sign off. Call us again if needed.   REPEAT EXAM:  eSignedAcquanetta Sit, MD 10/09/14 2:01 PM    CC:  CPT CODES: ICD CODES:  The ICD and CPT codes recommended by this software are interpretations from the data that the clinical staff has captured with the software.  The verification of the translation of this report to the ICD and CPT codes and modifiers is the sole responsibility of the health care institution and practicing physician where this report was generated.  [employer]. will not be held responsible for the validity of the ICD and CPT codes included on this report.  AMA assumes no liability for data contained or not contained herein. CPT is a Designer, television/film set of the [employer].  PATIENT NAME:  Barry, Dean MR#: [id number]

## 2014-09-15 NOTE — Clinical Documentation Improvement (Signed)
Possible Clinical Conditions? Septicemia / Sepsis Severe Sepsis Neutropenic Sepsis  Septic Shock Other Condition  Cannot clinically Determine   Supporting Information:Blood culture: 09-13-14 "CULTURE:GRAM POSITIVE COCCI IN PAIRS AND CHAINS" ED vitals: Temp: 99.1 F (37.3 C) (01/18 2112)  Temp Source: Oral (01/18 2112)  BP: 168/87 mmHg (01/18 2330)  Pulse Rate: 93 (01/18 2330)   Labs: Component     Latest Ref Rng 09/13/2014 09/14/2014 09/14/2014 09/14/2014          5:35 AM 10:36 AM  3:05 PM  WBC     4.0 - 10.5 K/uL 8.1 8.2    RBC     4.22 - 5.81 MIL/uL 4.66 4.43    Hemoglobin     13.0 - 17.0 g/dL 15.0 14.5 13.7 13.4  HCT     39.0 - 52.0 % 42.3 41.2 39.3 38.5 (L)  MCV     78.0 - 100.0 fL 90.8 93.0    MCH     26.0 - 34.0 pg 32.2 32.7    MCHC     30.0 - 36.0 g/dL 35.5 35.2    RDW     11.5 - 15.5 % 13.5 13.4    Platelets     150 - 400 K/uL 238 238    Neutrophils Relative %     43 - 77 % 70 70    NEUT#     1.7 - 7.7 K/uL 5.7 5.8    Lymphocytes Relative     12 - 46 % 21 22     Component     Latest Ref Rng 09/14/2014 09/15/2014         8:44 PM   WBC     4.0 - 10.5 K/uL  10.7 (H)  RBC     4.22 - 5.81 MIL/uL  3.83 (L)  Hemoglobin     13.0 - 17.0 g/dL 13.3 12.3 (L)  HCT     39.0 - 52.0 % 38.0 (L) 36.2 (L)  MCV     78.0 - 100.0 fL  94.5  MCH     26.0 - 34.0 pg  32.1  MCHC     30.0 - 36.0 g/dL  34.0  RDW     11.5 - 15.5 %  13.7  Platelets     150 - 400 K/uL  216  Neutrophils Relative %     43 - 77 %  76  NEUT#     1.7 - 7.7 K/uL  8.2 (H)  Lymphocytes Relative     12 - 46 %  15    Thank You, Alessandra Grout, RN, BSN, CCDS,Clinical Documentation Specialist:  [phone]  [phone]=[facility]- [facility] Information Management

## 2014-09-15 NOTE — Clinical Social Work Psychosocial (Signed)
Clinical Social Work Department BRIEF PSYCHOSOCIAL ASSESSMENT 09/15/2014  Patient:  Barry Dean, Barry Dean     Account Number:  [id number]     Admit date:  09/13/2014  Clinical Social Worker:  Marciano Sequin  Date/Time:  09/15/2014 08:20 AM  Referred by:  RN  Date Referred:  09/15/2014 Referred for  SNF Placement   Other Referral:   Interview type:  Family Other interview type:   Pt's oriented to self only; Koble,Susan Daughter [phone]    PSYCHOSOCIAL DATA Living Status:  FACILITY Admitted from facility:  [facility] [place] Level of care:  Assisted Living Primary support name:  Azaiah Licciardi & Willaim Bane Primary support relationship to patient:  CHILD, ADULT Degree of support available:   [place] Support    CURRENT CONCERNS Current Concerns  None Noted   Other Concerns:    SOCIAL WORK ASSESSMENT / PLAN CSW spoke with the pt's daughter Manuela Schwartz. CSW introduced self and purpose of the call. Manuela Schwartz reported the pt is from [facility]. CSW and the Manuela Schwartz discussed the clinical team recommendations for rehab. Manuela Schwartz reported that she prefers the pt to return back to his ALF, but if his ALF cannot take him back she prefers he goes to [place], [facility] or [place] [facility]. CSW provided the Manuela Schwartz with contact information for further questions. CSW will continue to follow this pt and assist with discharge as needed.   Assessment/plan status:  Psychosocial Support/Ongoing Assessment of Needs Other assessment/ plan:   Left voice message for [facility]) [phone] to determine if they can accept the pt back.   Information/referral to community resources:    PATIENT'S/FAMILY'S RESPONSE TO PLAN OF CARE: Manuela Schwartz was receptive and engaged in the assessment. Manuela Schwartz expressed Dean strong desire for the pt to return back to [facility] to receive rehab through [name].    [place], MSW, [place]

## 2014-09-15 NOTE — Progress Notes (Signed)
eMD micro round - alerted by eRN  Positive blood culture - GPC  Results for orders placed or performed during the hospital encounter of 09/13/14  Culture, blood (routine x 2)     Status: None (Preliminary result)   Collection Time: 09/13/14 10:00 PM  Result Value Ref Range Status   Specimen Description BLOOD HAND LEFT  Final   Special Requests BOTTLES DRAWN AEROBIC AND ANAEROBIC 5CC  Final   Culture   Final    GRAM POSITIVE COCCI IN PAIRS AND CHAINS Note: Gram Stain Report Called to,Read Back By and Verified With: GABE SANTONELLA ON 1.19.2016 AT 9:28P BY WILEJ Performed at [facility]    Report Status PENDING  Incomplete  MRSA PCR Screening     Status: None   Collection Time: 09/14/14  4:37 AM  Result Value Ref Range Status   MRSA by PCR NEGATIVE NEGATIVE Final    Comment:        The GeneXpert MRSA Assay (FDA approved for NASAL specimens only), is one component of a comprehensive MRSA colonization surveillance program. It is not intended to diagnose MRSA infection nor to guide or monitor treatment for MRSA infections.     Current Dx    ICD-9-CM ICD-10-CM   1. Intractable vomiting with nausea, vomiting of unspecified type 536.2 R11.10   2. Aspiration into airway 934.9 T17.998A DG Chest 2 View     DG Chest 2 View     CANCELED: DG Chest 2 View     CANCELED: DG Chest 2 View  3. Nausea 787.02 R11.0 DG Abd Portable 1V     DG Abd Portable 1V  4. Encounter for imaging study to confirm nasogastric (NG) tube placement V72.5 Z01.89 CANCELED: DG Abd 1 View     CANCELED: DG Abd 1 View  5. Vomiting and diarrhea 787.03 R11.10 DG Abd Portable 1V   787.91 R19.7 DG Abd Portable 1V      Current LABS  Recent Labs Lab 09/13/14 2200 09/14/14 0535 09/14/14 1036 09/14/14 1505 09/14/14 2044  HGB 15.0 14.5 13.7 13.4 13.3  HCT 42.3 41.2 39.3 38.5* 38.0*  WBC 8.1 8.2  --   --   --   PLT 238 238  --   --   --     Recent Labs Lab 09/13/14 2200 09/14/14 0535  NA 133*  134*  K 3.9 3.5  CL 94* 98  CO2 30 25  GLUCOSE 109* 105*  BUN 11 10  CREATININE 0.89 0.98  CALCIUM 9.7 9.0    CURRENT ABX Anti-infectives    Start     Dose/Rate Route Frequency Ordered Stop   09/14/14 1000  amoxicillin-clavulanate (AUGMENTIN) 500-125 MG per tablet 500 mg  Status:  Discontinued     1 tablet Oral 3 times per day 09/14/14 0841 09/14/14 1703   09/14/14 0800  piperacillin-tazobactam (ZOSYN) IVPB 3.375 g  Status:  Discontinued     3.375 g12.5 mL/hr over 240 Minutes Intravenous Every 8 hours 09/14/14 0204 09/14/14 0819   09/14/14 0300  vancomycin (VANCOCIN) 1,250 mg in sodium chloride 0.9 % 250 mL IVPB  Status:  Discontinued     1,250 mg166.7 mL/hr over 90 Minutes Intravenous Every 12 hours 09/14/14 0204 09/14/14 0807   09/14/14 0215  piperacillin-tazobactam (ZOSYN) IVPB 3.375 g     3.375 g100 mL/hr over 30 Minutes Intravenous  Once 09/14/14 0204 09/14/14 0313     \   A GPC - bacteremia v contaminant   P Check PCT Start  vanc    Dr. Brand Males, M.D., [facility].C.P Pulmonary and Critical Care Medicine Staff Physician [facility] Pager: [phone], If no answer or between  15:00h - 7:00h: call [phone]  09/15/2014 12:31 AM

## 2014-09-15 NOTE — Progress Notes (Signed)
ANTIBIOTIC CONSULT NOTE - INITIAL  Pharmacy Consult for Vancomycin  Indication: +Blood Cultures  Allergies  Allergen Reactions  . Topamax Other (See Comments)    Unknown reaction  . Uroxatral [Alfuzosin Hydrochloride] Other (See Comments)    Unknown reaction  . Valium Other (See Comments)    Unknown reaction    Patient Measurements: Height: 6\' 4"  (193 cm) Weight: 205 lb 0.4 oz (93 kg) IBW/kg (Calculated) : 86.8 Vital Signs: Temp: 99 F (37.2 C) (01/19 2316) Temp Source: Oral (01/19 2316) BP: 129/64 mmHg (01/19 2319) Pulse Rate: 91 (01/19 2319)  Labs:  Recent Labs  09/13/14 2200 09/14/14 0535 09/14/14 1036 09/14/14 1505 09/14/14 2044  WBC 8.1 8.2  --   --   --   HGB 15.0 14.5 13.7 13.4 13.3  PLT 238 238  --   --   --   CREATININE 0.89 0.98  --   --   --    Estimated Creatinine Clearance: 64 mL/min (by C-G formula based on Cr of 0.98). No results for input(s): VANCOTROUGH, VANCOPEAK, VANCORANDOM, GENTTROUGH, GENTPEAK, GENTRANDOM, TOBRATROUGH, TOBRAPEAK, TOBRARND, AMIKACINPEAK, AMIKACINTROU, AMIKACIN in the last 72 hours.   Microbiology: Recent Results (from the past 720 hour(s))  Culture, blood (routine x 2)     Status: None (Preliminary result)   Collection Time: 09/13/14 10:00 PM  Result Value Ref Range Status   Specimen Description BLOOD HAND LEFT  Final   Special Requests BOTTLES DRAWN AEROBIC AND ANAEROBIC 5CC  Final   Culture   Final    GRAM POSITIVE COCCI IN PAIRS AND CHAINS Note: Gram Stain Report Called to,Read Back By and Verified With: GABE SANTONELLA ON 1.19.2016 AT 9:28P BY WILEJ Performed at [facility]    Report Status PENDING  Incomplete  MRSA PCR Screening     Status: None   Collection Time: 09/14/14  4:37 AM  Result Value Ref Range Status   MRSA by PCR NEGATIVE NEGATIVE Final    Comment:        The GeneXpert MRSA Assay (FDA approved for NASAL specimens only), is one component of a comprehensive MRSA colonization surveillance  program. It is not intended to diagnose MRSA infection nor to guide or monitor treatment for MRSA infections.     Medical History: Past Medical History  Diagnosis Date  . Hypertension   . Seizures   . A-fib   . Dehydration   . Deafness   . Anxiety   . Reflux   . Arthritis   . Depression   . Atrial fibrillation   . Tremor, essential   . Seizure   . BPH (benign prostatic hyperplasia)   . Syncope   . Deaf   . Melanoma   . GERD (gastroesophageal reflux disease)   . Mitral regurgitation   . Abnormality of gait     Assessment: +Blood culture, re-start vancomycin, WBC WNL, renal function age appropriate, other labs as above.   Goal of Therapy:  Vancomycin trough level 15-20 mcg/ml  Plan:  -Vancomycin 1000 mg IV q12h -Trend WBC, temp, renal function  -Drug levels as indicated  -F/U cultures  Narda Bonds 09/15/2014,12:35 AM

## 2014-09-15 NOTE — Progress Notes (Signed)
Received report from [place], RN in endo. Pt due to return to room shortly.

## 2014-09-15 NOTE — Transfer of Care (Signed)
Immediate Anesthesia Transfer of Care Note  Patient: Barry Dean  Procedure(s) Performed: Procedure(s): ESOPHAGOGASTRODUODENOSCOPY (EGD) WITH PROPOFOL (N/A)  Patient Location: PACU and Endoscopy Unit  Anesthesia Type:MAC  Level of Consciousness: awake, alert  and sedated  Airway & Oxygen Therapy: Patient Spontanous Breathing and Patient connected to nasal cannula oxygen  Post-op Assessment: Report given to PACU RN, Post -op Vital signs reviewed and stable and Patient moving all extremities  Post vital signs: Reviewed and stable  Complications: No apparent anesthesia complications

## 2014-09-16 ENCOUNTER — Encounter (HOSPITAL_COMMUNITY): Payer: Self-pay | Admitting: Gastroenterology

## 2014-09-16 LAB — CBC WITH DIFFERENTIAL/PLATELET
BASOS PCT: 0 % (ref 0–1)
Basophils Absolute: 0 10*3/uL (ref 0.0–0.1)
EOS PCT: 2 % (ref 0–5)
Eosinophils Absolute: 0.1 10*3/uL (ref 0.0–0.7)
HCT: 32.9 % — ABNORMAL LOW (ref 39.0–52.0)
Hemoglobin: 11.3 g/dL — ABNORMAL LOW (ref 13.0–17.0)
LYMPHS ABS: 1.3 10*3/uL (ref 0.7–4.0)
Lymphocytes Relative: 19 % (ref 12–46)
MCH: 32.5 pg (ref 26.0–34.0)
MCHC: 34.3 g/dL (ref 30.0–36.0)
MCV: 94.5 fL (ref 78.0–100.0)
MONOS PCT: 8 % (ref 3–12)
Monocytes Absolute: 0.5 10*3/uL (ref 0.1–1.0)
NEUTROS PCT: 71 % (ref 43–77)
Neutro Abs: 4.8 10*3/uL (ref 1.7–7.7)
Platelets: 189 10*3/uL (ref 150–400)
RBC: 3.48 MIL/uL — ABNORMAL LOW (ref 4.22–5.81)
RDW: 13.4 % (ref 11.5–15.5)
WBC: 6.8 10*3/uL (ref 4.0–10.5)

## 2014-09-16 LAB — COMPREHENSIVE METABOLIC PANEL
ALT: 14 U/L (ref 0–53)
ANION GAP: 7 (ref 5–15)
AST: 20 U/L (ref 0–37)
Albumin: 2.9 g/dL — ABNORMAL LOW (ref 3.5–5.2)
Alkaline Phosphatase: 49 U/L (ref 39–117)
BILIRUBIN TOTAL: 0.9 mg/dL (ref 0.3–1.2)
BUN: 16 mg/dL (ref 6–23)
CALCIUM: 8.5 mg/dL (ref 8.4–10.5)
CO2: 26 mmol/L (ref 19–32)
Chloride: 103 mEq/L (ref 96–112)
Creatinine, Ser: 1 mg/dL (ref 0.50–1.35)
GFR calc Af Amer: 75 mL/min — ABNORMAL LOW (ref >90)
GFR, EST NON AFRICAN AMERICAN: 65 mL/min — AB (ref >90)
GLUCOSE: 92 mg/dL (ref 70–99)
Potassium: 3.8 mmol/L (ref 3.5–5.1)
SODIUM: 136 mmol/L (ref 135–145)
TOTAL PROTEIN: 5.5 g/dL — AB (ref 6.0–8.3)

## 2014-09-16 LAB — GLUCOSE, CAPILLARY
GLUCOSE-CAPILLARY: 112 mg/dL — AB (ref 70–99)
Glucose-Capillary: 92 mg/dL (ref 70–99)
Glucose-Capillary: 113 mg/dL — ABNORMAL HIGH (ref 70–99)
Glucose-Capillary: 99 mg/dL (ref 70–99)

## 2014-09-16 LAB — CULTURE, BLOOD (ROUTINE X 2)

## 2014-09-16 LAB — PROCALCITONIN: Procalcitonin: <0.1 ng/mL

## 2014-09-16 MED ORDER — KCL IN DEXTROSE-NACL 40-5-0.9 MEQ/L-%-% IV SOLN
INTRAVENOUS | Status: DC
  Filled 2014-09-16: qty 1000

## 2014-09-16 NOTE — Progress Notes (Signed)
Patient Demographics  Barry Dean, is a 81 y.o. male, DOB - December 04, 1923, CHY:[id number]  Admit date - 09/13/2014   Admitting Physician [name], MD  Outpatient Primary MD for the patient is Barry [place], MD  LOS - 3   Chief Complaint  Patient presents with  . Emesis        Subjective:   [name] today has, No headache, No chest pain, No abdominal pain , No new weakness tingling or numbness, No Cough - SOB. Positive nausea and vomiting   Assessment & Plan    1. Emesis, persistent - with upper GI bleed. Seen by GI underwent EGD which showed hiatal hernia with gastritis, improved on IV PPI and bowel rest, initially required NG tube. Much improved NG tube removed, advance diet. Monitor. H&H stable no transfusion needed.   2. Seizure disorder. Placed on IV Keppra and Vimpat.   3. Hypertension. As needed IV hydralazine.   4. Possible aspiration due to ongoing emesis. Chest x-ray stable. Monitor.   5. 1 out of 2 blood cultures positive for Strep Viridans. Discussed with ID physician Dr. Graylon Good likely from GI source. Continue IV vancomycin while he is in the hospital then switch him to oral Keflex to complete total of 10 days. Repeat blood cultures at the end of 10 days by PCP to document clearing of the infection.      Code Status: DNR  Family Communication: None Present  Disposition Plan: SNF  Procedures   CT scan abdomen and pelvis showing hiatal hernia.  EGD by GI physician Dr. [name] on 09/15/2014 -  hiatal hernia with gastritis   Consults  [facility]   Medications  Scheduled Meds: . antiseptic oral rinse  7 mL Mouth Rinse BID  . benzocaine   Mouth/Throat Once  . bisacodyl  10 mg Rectal Daily  . lacosamide (VIMPAT) IV  200 mg Intravenous Q24H  .  levETIRAcetam  500 mg Intravenous Q12H  . ondansetron (ZOFRAN) IV  4 mg Intravenous 4 times per day  . pantoprazole (PROTONIX) IV  40 mg Intravenous Q12H  . piperacillin-tazobactam (ZOSYN)  IV  3.375 g Intravenous Q8H  . vancomycin  1,000 mg Intravenous Q12H   Continuous Infusions: . dextrose 5 % and 0.9 % NaCl with KCl 40 mEq/L 50 mL/hr at 09/16/14 0752   PRN Meds:.hydrALAZINE, promethazine, promethazine  DVT Prophylaxis    SCDs    Lab Results  Component Value Date   PLT 189 09/16/2014    Antibiotics    Anti-infectives    Start     Dose/Rate Route Frequency Ordered Stop   09/15/14 1000  piperacillin-tazobactam (ZOSYN) IVPB 3.375 g     3.375 g12.5 mL/hr over 240 Minutes Intravenous Every 8 hours 09/15/14 0923     09/15/14 0100  vancomycin (VANCOCIN) IVPB 1000 mg/200 mL premix     1,000 mg200 mL/hr over 60 Minutes Intravenous Every 12 hours 09/15/14 0036     09/14/14 1000  amoxicillin-clavulanate (AUGMENTIN) 500-125 MG per tablet 500 mg  Status:  Discontinued     1 tablet Oral 3 times per day 09/14/14 0841 09/14/14 1703   09/14/14 0800  piperacillin-tazobactam (ZOSYN) IVPB 3.375 g  Status:  Discontinued     3.375 g12.5 mL/hr over  240 Minutes Intravenous Every 8 hours 09/14/14 0204 09/14/14 0819   09/14/14 0300  vancomycin (VANCOCIN) 1,250 mg in sodium chloride 0.9 % 250 mL IVPB  Status:  Discontinued     1,250 mg166.7 mL/hr over 90 Minutes Intravenous Every 12 hours 09/14/14 0204 09/14/14 0807   09/14/14 0215  piperacillin-tazobactam (ZOSYN) IVPB 3.375 g     3.375 g100 mL/hr over 30 Minutes Intravenous  Once 09/14/14 0204 09/14/14 0313          Objective:   Filed Vitals:   09/15/14 1500 09/15/14 1758 09/15/14 2134 09/16/14 0532  BP:  145/80 166/66 118/49  Pulse:  89 79 64  Temp: 98.4 F (36.9 C) 97 F (36.1 C) 99.3 F (37.4 C) 99.5 F (37.5 C)  TempSrc: Oral Oral Oral Oral  Resp:   16 20  Height:   6\' 4"  (1.93 m)   Weight:   92.2 kg (203 lb 4.2 oz)   SpO2:  94%  96% 94%    Wt Readings from Last 3 Encounters:  09/15/14 92.2 kg (203 lb 4.2 oz)  07/13/14 99.519 kg (219 lb 6.4 oz)  05/06/14 98.431 kg (217 lb)     Intake/Output Summary (Last 24 hours) at 09/16/14 0914 Last data filed at 09/16/14 0310  Gross per 24 hour  Intake 1413.75 ml  Output     50 ml  Net 1363.75 ml     Physical Exam  Awake Alert, Oriented X 3, No new F.N deficits, Normal affect [place].AT,PERRAL. Hard of hearing Supple Neck,No JVD, No cervical lymphadenopathy appriciated.  Symmetrical Chest wall movement, Good air movement bilaterally, CTAB RRR,No Gallops,Rubs or new Murmurs, No Parasternal Heave +ve B.Sounds, Abd Soft, No tenderness, No organomegaly appriciated, No rebound - guarding or rigidity.   No Cyanosis, Clubbing or edema, No new Rash or bruise      Data Review   Micro Results Recent Results (from the past 240 hour(s))  Culture, blood (routine x 2)     Status: None   Collection Time: 09/13/14 10:00 PM  Result Value Ref Range Status   Specimen Description BLOOD HAND LEFT  Final   Special Requests BOTTLES DRAWN AEROBIC AND ANAEROBIC 5CC  Final   Culture   Final    VIRIDANS STREPTOCOCCUS Note: Gram Stain Report Called to,Read Back By and Verified With: GABE SANTONELLA ON 1.19.2016 AT 9:28P BY WILEJ Performed at [facility]    Report Status 09/16/2014 FINAL  Final  Culture, blood (routine x 2)     Status: None (Preliminary result)   Collection Time: 09/13/14 10:19 PM  Result Value Ref Range Status   Specimen Description BLOOD ARM RIGHT  Final   Special Requests BOTTLES DRAWN AEROBIC AND ANAEROBIC 3CC EA  Final   Culture   Final           BLOOD CULTURE RECEIVED NO GROWTH TO DATE CULTURE WILL BE HELD FOR 5 DAYS BEFORE ISSUING A FINAL NEGATIVE REPORT Performed at [facility]    Report Status PENDING  Incomplete  Culture, Urine     Status: None   Collection Time: 09/13/14 10:28 PM  Result Value Ref Range Status   Specimen Description  URINE, RANDOM  Final   Special Requests Immunocompromised  Final   Colony Count NO GROWTH Performed at [facility]   Final   Culture NO GROWTH Performed at [facility]   Final   Report Status 09/15/2014 FINAL  Final  MRSA PCR Screening  Status: None   Collection Time: 09/14/14  4:37 AM  Result Value Ref Range Status   MRSA by PCR NEGATIVE NEGATIVE Final    Comment:        The GeneXpert MRSA Assay (FDA approved for NASAL specimens only), is one component of a comprehensive MRSA colonization surveillance program. It is not intended to diagnose MRSA infection nor to guide or monitor treatment for MRSA infections.     Radiology Reports Dg Chest 1 View  09/14/2014   CLINICAL DATA:  Vomiting  EXAM: CHEST - 1 VIEW  COMPARISON:  04/30/2014  FINDINGS: There is a large gas containing hiatal hernia, recently characterized by abdominal CT. No definite increased distension. Reticular densities in the lower lungs is chronic and by CT is stable over 1 year. No edema, effusion, or pneumothorax. Unchanged aortic and cardiac contours, distorted by leftward rotation.  IMPRESSION: 1. Large hiatal hernia. 2. Chronic lung disease without acute superimposed finding.   Electronically Signed   By: Jorje Guild M.D.   On: 09/14/2014 00:34   Dg Chest 2 View  09/14/2014   CLINICAL DATA:  Lethargy, aspiration and airway, hypertension, seizures, history melanoma, GERD  EXAM: CHEST  2 VIEW  COMPARISON:  09/14/2014 at 0020 hr  FINDINGS: Normal heart size and pulmonary vascularity.  Atherosclerotic calcification aorta.  Large hiatal hernia.  Chronic basilar interstitial lung disease changes similar to 2015 exams.  LEFT basilar atelectasis.  No definite acute infiltrate, pleural effusion or pneumothorax.  Bones demineralized.  IMPRESSION: Large hiatal hernia.  Bibasilar chronic interstitial lung disease slightly greater on LEFT with accompanying chronic LEFT basilar atelectasis.  No acute  abnormalities.   Electronically Signed   By: Lavonia Dana M.D.   On: 09/14/2014 09:56   Ct Abdomen Pelvis W Contrast  09/14/2014   CLINICAL DATA:  Pt ate dinner and began to vomit over 15 times in 2.5 hrs. No other patrons became sick after eating. Denies diareah. Pt temp 99.1. EMS gave pt 4mg  zophran  EXAM: CT ABDOMEN AND PELVIS WITH CONTRAST  TECHNIQUE: Multidetector CT imaging of the abdomen and pelvis was performed using the standard protocol following bolus administration of intravenous contrast.  CONTRAST:  135mL OMNIPAQUE IOHEXOL 300 MG/ML  SOLN  COMPARISON:  04/25/2014.  FINDINGS: Moderate hiatal hernia, predominately paraesophageal, stable. Stable reticular lung base opacities most evident in the left lower lobe, consistent with scarring. Heart normal in size.  Liver and spleen: Unremarkable. Vague density in the dependent gallbladder measuring 16 mm. This may reflect a in mildly opaque stone. No wall thickening. No acute cholecystitis. No bile duct dilation. Unremarkable pancreas.  No adrenal masses. 12 mm low-density area along the medial lower pole of the left kidney most consistent with a cyst. No other renal masses, no stones and no hydronephrosis. Normal ureters. Unremarkable bladder.  No adenopathy.  No abnormal fluid collections.  Mild increased stool in the colon. No colonic wall thickening or inflammatory changes. Small bowel is unremarkable. Appendix not visualized. No acute appendicitis.  Degenerative changes throughout the visualized spine. Bones are demineralized. No osteoblastic or osteolytic lesions.  IMPRESSION: 1. No acute findings. 2. Moderate hiatal hernia, paraesophageal, stable. 3. Chronic lung base scarring. 4. Mild increased stool in the colon. 5. Probable minimally opaque gallstone.  No acute cholecystitis. 6. Significant degenerative changes throughout the visualized spine.   Electronically Signed   By: Lajean Manes M.D.   On: 09/14/2014 00:02   Dg Abd Portable 1v  09/14/2014    CLINICAL DATA:  Vomiting, diarrhea and weakness.  EXAM: PORTABLE ABDOMEN - 1 VIEW  COMPARISON:  Abdominal films earlier today at 0630 hr  FINDINGS: No evidence of bowel obstruction significant ileus. Similar pattern of retained fecal material in the colon. Excreted contrast is again identified in the urinary bladder. Degenerative changes are present in the lower lumbar spine.  IMPRESSION: No evidence of bowel obstruction or significant ileus.   Electronically Signed   By: Aletta Edouard M.D.   On: 09/14/2014 20:26   Dg Abd Portable 1v  09/14/2014   CLINICAL DATA:  Nausea and vomiting  EXAM: PORTABLE ABDOMEN - 1 VIEW  COMPARISON:  CT abdomen pelvis 09/13/2014  FINDINGS: Moderate retained stool in the colon. Negative for bowel obstruction or ileus. Lumbar scoliosis. Urinary bladder is filled with contrast from recent CT scan and intravenous contrast injection.  IMPRESSION: Constipation without bowel obstruction or ileus.   Electronically Signed   By: Franchot Gallo M.D.   On: 09/14/2014 07:02     CBC  Recent Labs Lab 09/13/14 2200 09/14/14 0535  09/14/14 2044 09/15/14 0255 09/15/14 1220 09/15/14 2041 09/16/14 0542  WBC 8.1 8.2  --   --  10.7*  --   --  6.8  HGB 15.0 14.5  < > 13.3 12.3* 11.9* 11.7* 11.3*  HCT 42.3 41.2  < > 38.0* 36.2* 35.1* 34.0* 32.9*  PLT 238 238  --   --  216  --   --  189  MCV 90.8 93.0  --   --  94.5  --   --  94.5  MCH 32.2 32.7  --   --  32.1  --   --  32.5  MCHC 35.5 35.2  --   --  34.0  --   --  34.3  RDW 13.5 13.4  --   --  13.7  --   --  13.4  LYMPHSABS 1.7 1.8  --   --  1.6  --   --  1.3  MONOABS 0.7 0.6  --   --  0.8  --   --  0.5  EOSABS 0.1 0.0  --   --  0.1  --   --  0.1  BASOSABS 0.0 0.0  --   --  0.0  --   --  0.0  < > = values in this interval not displayed.  Chemistries   Recent Labs Lab 09/13/14 2200 09/14/14 0535 09/15/14 0255 09/16/14 0542  NA 133* 134* 136 136  K 3.9 3.5 3.5 3.8  CL 94* 98 101 103  CO2 30 25 32 26  GLUCOSE 109*  105* 132* 92  BUN 11 10 19 16   CREATININE 0.89 0.98 0.97 1.00  CALCIUM 9.7 9.0 8.9 8.5  AST 24 24 20 20   ALT 19 18 15 14   ALKPHOS 71 66 54 49  BILITOT 0.9 1.5* 1.3* 0.9   ------------------------------------------------------------------------------------------------------------------ estimated creatinine clearance is 62.7 mL/min (by C-G formula based on Cr of 1). ------------------------------------------------------------------------------------------------------------------ No results for input(s): HGBA1C in the last 72 hours. ------------------------------------------------------------------------------------------------------------------ No results for input(s): CHOL, HDL, LDLCALC, TRIG, CHOLHDL, LDLDIRECT in the last 72 hours. ------------------------------------------------------------------------------------------------------------------ No results for input(s): TSH, T4TOTAL, T3FREE, THYROIDAB in the last 72 hours.  Invalid input(s): FREET3 ------------------------------------------------------------------------------------------------------------------ No results for input(s): VITAMINB12, FOLATE, FERRITIN, TIBC, IRON, RETICCTPCT in the last 72 hours.  Coagulation profile  Recent Labs Lab 09/13/14 2200  INR 0.97    No results for input(s): DDIMER in the last 72 hours.  Cardiac Enzymes  Recent Labs Lab 09/13/14 2200  TROPONINI <0.03   ------------------------------------------------------------------------------------------------------------------ Invalid input(s): POCBNP     Time Spent in minutes   35   SINGH,PRASHANT K M.D on 09/16/2014 at 9:14 AM  Between 7am to 7pm - Pager - [phone]  After 7pm go to [email or web address] - [facility] Office  [phone]

## 2014-09-16 NOTE — Care Management Note (Signed)
CARE MANAGEMENT NOTE 09/16/2014  Patient:  Barry Dean,Barry Dean   Account Number:  [id number]  Date Initiated:  09/16/2014  Documentation initiated by:  Ejay Lashley  Subjective/Objective Assessment:   CM following for progression and d/c planning.     Action/Plan:   09/16/14 Met with pt and duaghter, noted orders for HHPT RN and aide, this pt is resident of [facility], will forward orders for HHPT as they contract with [facility] PT agency and will arrange.   Anticipated DC Date:  09/16/2014   Anticipated DC Plan:  ASSISTED LIVING / REST HOME         Choice offered to / List presented to:             Status of service:  Completed, signed off Medicare Important Message given?  YES (If response is "NO", the following Medicare IM given date fields will be blank) Date Medicare IM given:  09/16/2014 Medicare IM given by:  Bernese Doffing Date Additional Medicare IM given:   Additional Medicare IM given by:    Discharge Disposition:  ASSISTED LIVING  Per UR Regulation:    If discussed at Long Length of Stay Meetings, dates discussed:    Comments:

## 2014-09-16 NOTE — Progress Notes (Signed)
NG tube removed per MD order. Patient tolerated well.

## 2014-09-17 LAB — GLUCOSE, CAPILLARY
GLUCOSE-CAPILLARY: 182 mg/dL — AB (ref 70–99)
Glucose-Capillary: 87 mg/dL (ref 70–99)

## 2014-09-17 LAB — CBC WITH DIFFERENTIAL/PLATELET
BASOS ABS: 0 10*3/uL (ref 0.0–0.1)
Basophils Relative: 0 % (ref 0–1)
EOS PCT: 1 % (ref 0–5)
Eosinophils Absolute: 0 10*3/uL (ref 0.0–0.7)
HCT: 32.3 % — ABNORMAL LOW (ref 39.0–52.0)
HEMOGLOBIN: 11.2 g/dL — AB (ref 13.0–17.0)
LYMPHS ABS: 1.3 10*3/uL (ref 0.7–4.0)
Lymphocytes Relative: 23 % (ref 12–46)
MCH: 32.1 pg (ref 26.0–34.0)
MCHC: 34.7 g/dL (ref 30.0–36.0)
MCV: 92.6 fL (ref 78.0–100.0)
MONOS PCT: 12 % (ref 3–12)
Monocytes Absolute: 0.7 10*3/uL (ref 0.1–1.0)
NEUTROS ABS: 3.7 10*3/uL (ref 1.7–7.7)
NEUTROS PCT: 64 % (ref 43–77)
Platelets: 206 10*3/uL (ref 150–400)
RBC: 3.49 MIL/uL — ABNORMAL LOW (ref 4.22–5.81)
RDW: 13.1 % (ref 11.5–15.5)
WBC: 5.7 10*3/uL (ref 4.0–10.5)

## 2014-09-17 LAB — COMPREHENSIVE METABOLIC PANEL
ALBUMIN: 2.9 g/dL — AB (ref 3.5–5.2)
ALK PHOS: 54 U/L (ref 39–117)
ALT: 41 U/L (ref 0–53)
AST: 43 U/L — AB (ref 0–37)
Anion gap: 5 (ref 5–15)
BUN: 12 mg/dL (ref 6–23)
CO2: 29 mmol/L (ref 19–32)
Calcium: 8.3 mg/dL — ABNORMAL LOW (ref 8.4–10.5)
Chloride: 98 mEq/L (ref 96–112)
Creatinine, Ser: 0.88 mg/dL (ref 0.50–1.35)
GFR, EST AFRICAN AMERICAN: 86 mL/min — AB (ref >90)
GFR, EST NON AFRICAN AMERICAN: 74 mL/min — AB (ref >90)
Glucose, Bld: 103 mg/dL — ABNORMAL HIGH (ref 70–99)
POTASSIUM: 3.6 mmol/L (ref 3.5–5.1)
SODIUM: 132 mmol/L — AB (ref 135–145)
Total Bilirubin: 0.8 mg/dL (ref 0.3–1.2)
Total Protein: 5.6 g/dL — ABNORMAL LOW (ref 6.0–8.3)

## 2014-09-17 LAB — PROCALCITONIN: Procalcitonin: <0.1 ng/mL

## 2014-09-17 MED ORDER — ONDANSETRON HCL 4 MG PO TABS: 4.0000 mg | ORAL_TABLET | Freq: Three times a day (TID) | ORAL | Status: DC | PRN

## 2014-09-17 MED ORDER — HYDROCODONE-ACETAMINOPHEN 5-325 MG PO TABS: 1.0000 | ORAL_TABLET | Freq: Three times a day (TID) | ORAL | Status: DC | PRN

## 2014-09-17 MED ORDER — ASPIRIN 81 MG PO CHEW: 81.0000 mg | CHEWABLE_TABLET | Freq: Every evening | ORAL | Status: AC

## 2014-09-17 MED ORDER — CEPHALEXIN 500 MG PO CAPS: 500.0000 mg | ORAL_CAPSULE | Freq: Four times a day (QID) | ORAL | Status: DC

## 2014-09-17 MED ORDER — PANTOPRAZOLE SODIUM 40 MG PO TBEC: 40.0000 mg | DELAYED_RELEASE_TABLET | Freq: Two times a day (BID) | ORAL | Status: DC

## 2014-09-17 NOTE — Clinical Social Work Note (Signed)
Patient will discharge back to [facility] today, transported by daughter Dacoda Finlay.  Krystyl Cannell Givens, MSW, LCSW Licensed Clinical Social Worker [place] [phone]

## 2014-09-17 NOTE — Discharge Summary (Signed)
Barry Dean, is a 81 y.o. male  DOB 25-Feb-1924  MRN [id number].  Admission date:  09/13/2014  Admitting Physician  [name], MD  Discharge Date:  09/17/2014   Primary MD  Barry Argyle, MD  Recommendations for primary care physician for things to follow:    check CBC, CMP and two-view chest x-ray in a week.   check blood cultures after he has finished oral antibiotics   Admission Diagnosis  Aspiration into airway [T17.998A] Intractable vomiting with nausea, vomiting of unspecified type [R11.10]   Discharge Diagnosis  Aspiration into airway [T17.998A] Intractable vomiting with nausea, vomiting of unspecified type [R11.10]     Principal Problem:   Emesis, persistent Active Problems:   Seizure disorder   Benign essential HTN      Past Medical History  Diagnosis Date  . Hypertension   . Seizures   . A-fib   . Dehydration   . Deafness   . Anxiety   . Reflux   . Arthritis   . Depression   . Atrial fibrillation   . Tremor, essential   . Seizure   . BPH (benign prostatic hyperplasia)   . Syncope   . Deaf   . Melanoma   . GERD (gastroesophageal reflux disease)   . Mitral regurgitation   . Abnormality of gait     Past Surgical History  Procedure Laterality Date  . Unknown    . Appendectomy    . Cataract extraction w/ intraocular lens  implant, bilateral    . Esophagogastroduodenoscopy (egd) with propofol N/A 09/15/2014    Procedure: ESOPHAGOGASTRODUODENOSCOPY (EGD) WITH PROPOFOL;  Surgeon: Barry Horner, MD;  Location: [facility] ENDOSCOPY;  Service: Endoscopy;  Laterality: N/A;       History of present illness and  Hospital Course:     Kindly see H&P for history of present illness and admission details, please review complete Labs, Consult reports and Test reports for all details in brief  HPI   from the history and physical done on the day of admission  This is a 81 y.o. year old male with significant past medical history of htn, seizure disorder, deafness, afib, gerd, mitral regurgitation presenting with intractable emesis. Level V caveat as NG tube being actively placed at bedside in setting of baseline deafness w/ no hearing aid in place. Protracted attempt. Pt resident of assisted living facility. Per report, pt was eating dinner when he began to persistently vomit, up to 15 times over 2.5 hours. No reports of diarrhea or abd pain. Was sent to ER because of persistent sxs. Was given IV zofran w/ minimal improvement in sxs.  Presented to ER T 99.1, HR 90s-100s, resp 10s-20s, BP 160s-190s/90s-100s in setting of emesis, satting mid 90s on 2L non rebreather. WBC 8.1, hgb 15.1, Na 133, Cr 0.89. Lipase 25. Lactate 0.96. CXR shows large hiatal hernia w/ chronic lung disease. No acute findings. CT abd and pelvis w/ contrast negative for any acute findings. + hiatal hernia, chronic lung scarring, mild increased stool in  colon, minimally opaque gallstone w/ no acute cholecystitis.  Noted intractable and recurrent episodes of emesis at bedside, worsened w/ attempt of NGT placement. High concern for aspiration. Concern for emesis being coffee ground vs. Feculent.    Hospital Course    1. Emesis, persistent - with upper GI bleed. Seen by GI underwent EGD which showed hiatal hernia with gastritis, improved on IV PPI and bowel rest, initially required NG tube. Much improved NG tube removed, advanced diet, H&H stable no transfusion needed. No symptom-free for over 24 hours will be discharged on PPI. Held aspirin for 7 more days. Outpatient GI and PCP follow-up.   2. Seizure disorder.  continue home dose Keppra and Vimpat.   3. Hypertension.  Stable no acute issues.   4. Possible aspiration due to ongoing emesis. Chest x-ray stable. Monitor.   5. 1 out of 2 blood cultures positive for Strep  Viridans. Discussed with ID physician Dr. Graylon Dean likely from GI source.  He received IV vancomycin while he is in the hospital then switched him to oral Keflex to complete total of 10 days. Repeat blood cultures at the end of 10 days by PCP to document clearing of the infection.     Discharge Condition: Stable   Follow UP  Follow-up Information    Follow up with Barry Argyle, MD. Schedule an appointment as soon as possible for a visit in 1 week.   Specialty:  Internal Medicine   Contact information:   [address] [place] [address] [phone]       Follow up with Barry Basques, MD. Schedule an appointment as soon as possible for a visit in 1 week.   Specialty:  Infectious Diseases   Contact information:   [address] [place] [address] [phone]       Follow up with Barry Horner, MD. Schedule an appointment as soon as possible for a visit in 1 week.   Specialty:  Gastroenterology   Contact information:   [address], [place] [address] [phone]         Discharge Instructions  and  Discharge Medications          Discharge Instructions    Discharge instructions    Complete by:  As directed   Follow with Primary MD Barry Argyle, MD or SNF M.D. in 3 days   Get CBC, CMP, 2 view Chest X ray checked  by Primary MD next visit.   Needs repeat blood cultures after last oral antibiotic given to document clearing of infection.   Activity: As tolerated with Full fall precautions use walker/cane & assistance as needed   Disposition SNF   Diet: Heart Healthy with feeding assistance and aspiration precautions as needed.  For Heart failure patients - Check your Weight same time everyday, if you gain over 2 pounds, or you develop in leg swelling, experience more shortness of breath or chest pain, call your Primary MD immediately. Follow Cardiac Low Salt Diet and 1.8 lit/day fluid  restriction.   On your next visit with your primary care physician please Get Medicines reviewed and adjusted.   Please request your Prim.MD to go over all Hospital Tests and Procedure/Radiological results at the follow up, please get all Hospital records sent to your Prim MD by signing hospital release before you go home.   If you experience worsening of your admission symptoms, develop shortness of breath, life threatening emergency, suicidal or homicidal thoughts you  must seek medical attention immediately by calling 911 or calling your MD immediately  if symptoms less severe.  You Must read complete instructions/literature along with all the possible adverse reactions/side effects for all the Medicines you take and that have been prescribed to you. Take any new Medicines after you have completely understood and accpet all the possible adverse reactions/side effects.   Do not drive, operating heavy machinery, perform activities at heights, swimming or participation in water activities or provide baby sitting services if your were admitted for syncope or siezures until you have seen by Primary MD or a Neurologist and advised to do so again.  Do not drive when taking Pain medications.    Do not take more than prescribed Pain, Sleep and Anxiety Medications  Special Instructions: If you have smoked or chewed Tobacco  in the last 2 yrs please stop smoking, stop any regular Alcohol  and or any Recreational drug use.  Wear Seat belts while driving.   Please note  You were cared for by a hospitalist during your hospital stay. If you have any questions about your discharge medications or the care you received while you were in the hospital after you are discharged, you can call the unit and asked to speak with the hospitalist on call if the hospitalist that took care of you is not available. Once you are discharged, your primary care physician will handle any further medical issues. Please note  that NO REFILLS for any discharge medications will be authorized once you are discharged, as it is imperative that you return to your primary care physician (or establish a relationship with a primary care physician if you do not have one) for your aftercare needs so that they can reassess your need for medications and monitor your lab values.     Increase activity slowly    Complete by:  As directed             Medication List    STOP taking these medications        omeprazole 20 MG capsule  Commonly known as:  PRILOSEC      TAKE these medications        acetaminophen 500 MG tablet  Commonly known as:  TYLENOL  Take 500 mg by mouth every 6 (six) hours as needed for pain. Not to exceed 3 gms of acetaminophen in 24 hours     amLODipine-benazepril 5-20 MG per capsule  Commonly known as:  LOTREL  Take 1 capsule by mouth daily.     aspirin 81 MG chewable tablet  Chew 1 tablet (81 mg total) by mouth every evening.  Start taking on:  09/22/2014     cephALEXin 500 MG capsule  Commonly known as:  KEFLEX  Take 1 capsule (500 mg total) by mouth 4 (four) times daily.     HYDROcodone-acetaminophen 5-325 MG per tablet  Commonly known as:  NORCO/VICODIN  Take 1 tablet by mouth every 8 (eight) hours as needed for moderate pain.     ipratropium-albuterol 0.5-2.5 (3) MG/3ML Soln  Commonly known as:  DUONEB  Inhale 3 mLs into the lungs 2 (two) times daily.     iron polysaccharides 150 MG capsule  Commonly known as:  NIFEREX  Take 150 mg by mouth daily.     labetalol 100 MG tablet  Commonly known as:  NORMODYNE  Take 100 mg by mouth 2 (two) times daily.     lacosamide 200 MG Tabs tablet  Commonly known as:  VIMPAT  Take 1 tablet (200 mg total) by mouth at bedtime.     levETIRAcetam 750 MG tablet  Commonly known as:  KEPPRA  Take 750 mg by mouth 2 (two) times daily.     LUBRIDERM ADVANCED THERAPY Lotn  Apply 1 application topically daily. Apply to feet after bathing every day      ondansetron 4 MG tablet  Commonly known as:  ZOFRAN  Take 1 tablet (4 mg total) by mouth every 8 (eight) hours as needed for nausea or vomiting.     pantoprazole 40 MG tablet  Commonly known as:  PROTONIX  Take 1 tablet (40 mg total) by mouth 2 (two) times daily.     polyethylene glycol packet  Commonly known as:  MIRALAX / GLYCOLAX  Take 17 g by mouth every other day.     sennosides-docusate sodium 8.6-50 MG tablet  Commonly known as:  SENOKOT-S  Take 1 tablet by mouth at bedtime as needed for constipation.          Diet and Activity recommendation: See Discharge Instructions above   Consults obtained - gi,   ID physician Dr. Graylon Dean over the phone    Major procedures and Radiology Reports - PLEASE review detailed and final reports for all details, in brief -     CT scan abdomen and pelvis showing hiatal hernia.  EGD by GI physician Dr. [name] on 09/15/2014 - hiatal hernia with gastritis    Dg Chest 1 View  09/14/2014   CLINICAL DATA:  Vomiting  EXAM: CHEST - 1 VIEW  COMPARISON:  04/30/2014  FINDINGS: There is a large gas containing hiatal hernia, recently characterized by abdominal CT. No definite increased distension. Reticular densities in the lower lungs is chronic and by CT is stable over 1 year. No edema, effusion, or pneumothorax. Unchanged aortic and cardiac contours, distorted by leftward rotation.  IMPRESSION: 1. Large hiatal hernia. 2. Chronic lung disease without acute superimposed finding.   Electronically Signed   By: Jorje Guild M.D.   On: 09/14/2014 00:34   Dg Chest 2 View  09/14/2014   CLINICAL DATA:  Lethargy, aspiration and airway, hypertension, seizures, history melanoma, GERD  EXAM: CHEST  2 VIEW  COMPARISON:  09/14/2014 at 0020 hr  FINDINGS: Normal heart size and pulmonary vascularity.  Atherosclerotic calcification aorta.  Large hiatal hernia.  Chronic basilar interstitial lung disease changes similar to 2015 exams.  LEFT basilar atelectasis.  No  definite acute infiltrate, pleural effusion or pneumothorax.  Bones demineralized.  IMPRESSION: Large hiatal hernia.  Bibasilar chronic interstitial lung disease slightly greater on LEFT with accompanying chronic LEFT basilar atelectasis.  No acute abnormalities.   Electronically Signed   By: Lavonia Dana M.D.   On: 09/14/2014 09:56   Ct Abdomen Pelvis W Contrast  09/14/2014   CLINICAL DATA:  Pt ate dinner and began to vomit over 15 times in 2.5 hrs. No other patrons became sick after eating. Denies diareah. Pt temp 99.1. EMS gave pt 4mg  zophran  EXAM: CT ABDOMEN AND PELVIS WITH CONTRAST  TECHNIQUE: Multidetector CT imaging of the abdomen and pelvis was performed using the standard protocol following bolus administration of intravenous contrast.  CONTRAST:  150mL OMNIPAQUE IOHEXOL 300 MG/ML  SOLN  COMPARISON:  04/25/2014.  FINDINGS: Moderate hiatal hernia, predominately paraesophageal, stable. Stable reticular lung base opacities most evident in the left lower lobe, consistent with scarring. Heart normal in size.  Liver and spleen: Unremarkable. Vague density in the dependent gallbladder measuring 16  mm. This may reflect a in mildly opaque stone. No wall thickening. No acute cholecystitis. No bile duct dilation. Unremarkable pancreas.  No adrenal masses. 12 mm low-density area along the medial lower pole of the left kidney most consistent with a cyst. No other renal masses, no stones and no hydronephrosis. Normal ureters. Unremarkable bladder.  No adenopathy.  No abnormal fluid collections.  Mild increased stool in the colon. No colonic wall thickening or inflammatory changes. Small bowel is unremarkable. Appendix not visualized. No acute appendicitis.  Degenerative changes throughout the visualized spine. Bones are demineralized. No osteoblastic or osteolytic lesions.  IMPRESSION: 1. No acute findings. 2. Moderate hiatal hernia, paraesophageal, stable. 3. Chronic lung base scarring. 4. Mild increased stool in the  colon. 5. Probable minimally opaque gallstone.  No acute cholecystitis. 6. Significant degenerative changes throughout the visualized spine.   Electronically Signed   By: Lajean Manes M.D.   On: 09/14/2014 00:02   Dg Abd Portable 1v  09/14/2014   CLINICAL DATA:  Vomiting, diarrhea and weakness.  EXAM: PORTABLE ABDOMEN - 1 VIEW  COMPARISON:  Abdominal films earlier today at 0630 hr  FINDINGS: No evidence of bowel obstruction significant ileus. Similar pattern of retained fecal material in the colon. Excreted contrast is again identified in the urinary bladder. Degenerative changes are present in the lower lumbar spine.  IMPRESSION: No evidence of bowel obstruction or significant ileus.   Electronically Signed   By: Aletta Edouard M.D.   On: 09/14/2014 20:26   Dg Abd Portable 1v  09/14/2014   CLINICAL DATA:  Nausea and vomiting  EXAM: PORTABLE ABDOMEN - 1 VIEW  COMPARISON:  CT abdomen pelvis 09/13/2014  FINDINGS: Moderate retained stool in the colon. Negative for bowel obstruction or ileus. Lumbar scoliosis. Urinary bladder is filled with contrast from recent CT scan and intravenous contrast injection.  IMPRESSION: Constipation without bowel obstruction or ileus.   Electronically Signed   By: Franchot Gallo M.D.   On: 09/14/2014 07:02    Micro Results      Recent Results (from the past 240 hour(s))  Culture, blood (routine x 2)     Status: None   Collection Time: 09/13/14 10:00 PM  Result Value Ref Range Status   Specimen Description BLOOD HAND LEFT  Final   Special Requests BOTTLES DRAWN AEROBIC AND ANAEROBIC 5CC  Final   Culture   Final    VIRIDANS STREPTOCOCCUS Note: Gram Stain Report Called to,Read Back By and Verified With: GABE SANTONELLA ON 1.19.2016 AT 9:28P BY WILEJ Performed at [facility]    Report Status 09/16/2014 FINAL  Final  Culture, blood (routine x 2)     Status: None (Preliminary result)   Collection Time: 09/13/14 10:19 PM  Result Value Ref Range Status    Specimen Description BLOOD ARM RIGHT  Final   Special Requests BOTTLES DRAWN AEROBIC AND ANAEROBIC 3CC EA  Final   Culture   Final           BLOOD CULTURE RECEIVED NO GROWTH TO DATE CULTURE WILL BE HELD FOR 5 DAYS BEFORE ISSUING A FINAL NEGATIVE REPORT Performed at [facility]    Report Status PENDING  Incomplete  Culture, Urine     Status: None   Collection Time: 09/13/14 10:28 PM  Result Value Ref Range Status   Specimen Description URINE, RANDOM  Final   Special Requests Immunocompromised  Final   Colony Count NO GROWTH Performed at [facility]   Final  Culture NO GROWTH Performed at [facility]   Final   Report Status 09/15/2014 FINAL  Final  MRSA PCR Screening     Status: None   Collection Time: 09/14/14  4:37 AM  Result Value Ref Range Status   MRSA by PCR NEGATIVE NEGATIVE Final    Comment:        The GeneXpert MRSA Assay (FDA approved for NASAL specimens only), is one component of a comprehensive MRSA colonization surveillance program. It is not intended to diagnose MRSA infection nor to guide or monitor treatment for MRSA infections.        Today   Subjective:   [name] today has no headache,no chest abdominal pain,no new weakness tingling or numbness, feels much better   Objective:   Blood pressure 131/53, pulse 73, temperature 99 F (37.2 C), temperature source Oral, resp. rate 17, height 6\' 4"  (1.93 m), weight 94.8 kg (208 lb 15.9 oz), SpO2 95 %.   Intake/Output Summary (Last 24 hours) at 09/17/14 1006 Last data filed at 09/16/14 1700  Gross per 24 hour  Intake    720 ml  Output      0 ml  Net    720 ml    Exam Awake Alert, Oriented x 3, No new F.N deficits, Normal affect [place].AT,PERRAL Supple Neck,No JVD, No cervical lymphadenopathy appriciated.  Symmetrical Chest wall movement, Dean air movement bilaterally, CTAB RRR,No Gallops,Rubs or new Murmurs, No Parasternal Heave +ve B.Sounds, Abd Soft, Non tender, No  organomegaly appriciated, No rebound -guarding or rigidity. No Cyanosis, Clubbing or edema, No new Rash or bruise  Data Review   CBC w Diff:  Lab Results  Component Value Date   WBC 5.7 09/17/2014   HGB 11.2* 09/17/2014   HCT 32.3* 09/17/2014   PLT 206 09/17/2014   LYMPHOPCT 23 09/17/2014   MONOPCT 12 09/17/2014   EOSPCT 1 09/17/2014   BASOPCT 0 09/17/2014    CMP:  Lab Results  Component Value Date   NA 132* 09/17/2014   K 3.6 09/17/2014   CL 98 09/17/2014   CO2 29 09/17/2014   BUN 12 09/17/2014   CREATININE 0.88 09/17/2014   PROT 5.6* 09/17/2014   ALBUMIN 2.9* 09/17/2014   BILITOT 0.8 09/17/2014   ALKPHOS 54 09/17/2014   AST 43* 09/17/2014   ALT 41 09/17/2014  .   Total Time in preparing paper work, data evaluation and todays exam - 35 minutes  Thurnell Lose M.D on 09/17/2014 at 10:06 AM  [facility] Office  [phone]

## 2014-09-17 NOTE — Discharge Instructions (Signed)
Follow with Primary MD [name], MD or SNF M.D. in 3 days   Get CBC, CMP, 2 view Chest X ray checked  by Primary MD next visit.   Needs repeat blood cultures after last oral antibiotic given to document clearing of infection.   Activity: As tolerated with Full fall precautions use walker/cane & assistance as needed   Disposition SNF   Diet: Heart Healthy with feeding assistance and aspiration precautions as needed.  For Heart failure patients - Check your Weight same time everyday, if you gain over 2 pounds, or you develop in leg swelling, experience more shortness of breath or chest pain, call your Primary MD immediately. Follow Cardiac Low Salt Diet and 1.8 lit/day fluid restriction.   On your next visit with your primary care physician please Get Medicines reviewed and adjusted.   Please request your Prim.MD to go over all Hospital Tests and Procedure/Radiological results at the follow up, please get all Hospital records sent to your Prim MD by signing hospital release before you go home.   If you experience worsening of your admission symptoms, develop shortness of breath, life threatening emergency, suicidal or homicidal thoughts you must seek medical attention immediately by calling 911 or calling your MD immediately  if symptoms less severe.  You Must read complete instructions/literature along with all the possible adverse reactions/side effects for all the Medicines you take and that have been prescribed to you. Take any new Medicines after you have completely understood and accpet all the possible adverse reactions/side effects.   Do not drive, operating heavy machinery, perform activities at heights, swimming or participation in water activities or provide baby sitting services if your were admitted for syncope or siezures until you have seen by Primary MD or a Neurologist and advised to do so again.  Do not drive when taking Pain medications.    Do not take  more than prescribed Pain, Sleep and Anxiety Medications  Special Instructions: If you have smoked or chewed Tobacco  in the last 2 yrs please stop smoking, stop any regular Alcohol  and or any Recreational drug use.  Wear Seat belts while driving.   Please note  You were cared for by a hospitalist during your hospital stay. If you have any questions about your discharge medications or the care you received while you were in the hospital after you are discharged, you can call the unit and asked to speak with the hospitalist on call if the hospitalist that took care of you is not available. Once you are discharged, your primary care physician will handle any further medical issues. Please note that NO REFILLS for any discharge medications will be authorized once you are discharged, as it is imperative that you return to your primary care physician (or establish a relationship with a primary care physician if you do not have one) for your aftercare needs so that they can reassess your need for medications and monitor your lab values.

## 2014-09-17 NOTE — Progress Notes (Signed)
UR completed Lucianne Smestad K. [place] Schreiner, RN, BSN, [place], CCM  09/17/2014 2:55 PM

## 2014-09-17 NOTE — Progress Notes (Signed)
Patient Discharge:  Disposition: [facility] facility  Education: Reported called to nurse at [facility]. FL2 sent to facilty.  IV: Removed  Transportation: Transported via car by daughter  Belongings:All belongings taken with pt.

## 2014-09-17 NOTE — Progress Notes (Signed)
PT Cancellation Note  Patient Details Name: Barry Dean MRN: [id number] DOB: [birth year]   Cancelled Treatment:    Reason Eval/Treat Not Completed: Pt to d/c to SNF today. If d/c is delayed, will continue to follow.    Rolinda Roan 09/17/2014, 10:33 AM   Rolinda Roan, PT, DPT Acute Rehabilitation Services Pager: [phone]

## 2014-09-20 LAB — CULTURE, BLOOD (ROUTINE X 2): Culture: NO GROWTH

## 2014-09-22 DIAGNOSIS — M6281 Muscle weakness (generalized): Secondary | ICD-10-CM | POA: Diagnosis not present

## 2014-09-22 DIAGNOSIS — R1312 Dysphagia, oropharyngeal phase: Secondary | ICD-10-CM | POA: Diagnosis not present

## 2014-09-22 DIAGNOSIS — R278 Other lack of coordination: Secondary | ICD-10-CM | POA: Diagnosis not present

## 2014-09-22 DIAGNOSIS — R262 Difficulty in walking, not elsewhere classified: Secondary | ICD-10-CM | POA: Diagnosis not present

## 2014-09-24 DIAGNOSIS — R6889 Other general symptoms and signs: Secondary | ICD-10-CM | POA: Diagnosis not present

## 2014-09-27 DIAGNOSIS — Z9181 History of falling: Secondary | ICD-10-CM | POA: Diagnosis not present

## 2014-09-27 DIAGNOSIS — R278 Other lack of coordination: Secondary | ICD-10-CM | POA: Diagnosis not present

## 2014-09-27 DIAGNOSIS — M6281 Muscle weakness (generalized): Secondary | ICD-10-CM | POA: Diagnosis not present

## 2014-09-27 DIAGNOSIS — R262 Difficulty in walking, not elsewhere classified: Secondary | ICD-10-CM | POA: Diagnosis not present

## 2014-09-28 DIAGNOSIS — R262 Difficulty in walking, not elsewhere classified: Secondary | ICD-10-CM | POA: Diagnosis not present

## 2014-09-28 DIAGNOSIS — R278 Other lack of coordination: Secondary | ICD-10-CM | POA: Diagnosis not present

## 2014-09-28 DIAGNOSIS — Z9181 History of falling: Secondary | ICD-10-CM | POA: Diagnosis not present

## 2014-09-28 DIAGNOSIS — M6281 Muscle weakness (generalized): Secondary | ICD-10-CM | POA: Diagnosis not present

## 2014-09-29 DIAGNOSIS — R262 Difficulty in walking, not elsewhere classified: Secondary | ICD-10-CM | POA: Diagnosis not present

## 2014-09-29 DIAGNOSIS — R278 Other lack of coordination: Secondary | ICD-10-CM | POA: Diagnosis not present

## 2014-09-29 DIAGNOSIS — M6281 Muscle weakness (generalized): Secondary | ICD-10-CM | POA: Diagnosis not present

## 2014-09-29 DIAGNOSIS — Z9181 History of falling: Secondary | ICD-10-CM | POA: Diagnosis not present

## 2014-09-30 DIAGNOSIS — R262 Difficulty in walking, not elsewhere classified: Secondary | ICD-10-CM | POA: Diagnosis not present

## 2014-09-30 DIAGNOSIS — M6281 Muscle weakness (generalized): Secondary | ICD-10-CM | POA: Diagnosis not present

## 2014-09-30 DIAGNOSIS — R278 Other lack of coordination: Secondary | ICD-10-CM | POA: Diagnosis not present

## 2014-09-30 DIAGNOSIS — Z9181 History of falling: Secondary | ICD-10-CM | POA: Diagnosis not present

## 2014-10-01 ENCOUNTER — Telehealth: Payer: Self-pay | Admitting: Podiatry

## 2014-10-01 DIAGNOSIS — R262 Difficulty in walking, not elsewhere classified: Secondary | ICD-10-CM | POA: Diagnosis not present

## 2014-10-01 DIAGNOSIS — M6281 Muscle weakness (generalized): Secondary | ICD-10-CM | POA: Diagnosis not present

## 2014-10-01 DIAGNOSIS — R278 Other lack of coordination: Secondary | ICD-10-CM | POA: Diagnosis not present

## 2014-10-01 DIAGNOSIS — Z9181 History of falling: Secondary | ICD-10-CM | POA: Diagnosis not present

## 2014-10-01 NOTE — Telephone Encounter (Signed)
FATHER HAD DEBRIDE AND NOW NAIL  HAS FALLEN OFF- DAUGHTER (poa) HAS QUESTIONS AND IS CONCERN ABOUT THIS FALLEN NAIL.

## 2014-10-04 DIAGNOSIS — R278 Other lack of coordination: Secondary | ICD-10-CM | POA: Diagnosis not present

## 2014-10-04 DIAGNOSIS — Z9181 History of falling: Secondary | ICD-10-CM | POA: Diagnosis not present

## 2014-10-04 DIAGNOSIS — M6281 Muscle weakness (generalized): Secondary | ICD-10-CM | POA: Diagnosis not present

## 2014-10-04 DIAGNOSIS — R262 Difficulty in walking, not elsewhere classified: Secondary | ICD-10-CM | POA: Diagnosis not present

## 2014-10-05 DIAGNOSIS — R262 Difficulty in walking, not elsewhere classified: Secondary | ICD-10-CM | POA: Diagnosis not present

## 2014-10-05 DIAGNOSIS — M6281 Muscle weakness (generalized): Secondary | ICD-10-CM | POA: Diagnosis not present

## 2014-10-05 DIAGNOSIS — Z9181 History of falling: Secondary | ICD-10-CM | POA: Diagnosis not present

## 2014-10-05 DIAGNOSIS — Z79899 Other long term (current) drug therapy: Secondary | ICD-10-CM | POA: Diagnosis not present

## 2014-10-05 DIAGNOSIS — R278 Other lack of coordination: Secondary | ICD-10-CM | POA: Diagnosis not present

## 2014-10-06 DIAGNOSIS — M6281 Muscle weakness (generalized): Secondary | ICD-10-CM | POA: Diagnosis not present

## 2014-10-06 DIAGNOSIS — Z9181 History of falling: Secondary | ICD-10-CM | POA: Diagnosis not present

## 2014-10-06 DIAGNOSIS — R262 Difficulty in walking, not elsewhere classified: Secondary | ICD-10-CM | POA: Diagnosis not present

## 2014-10-06 DIAGNOSIS — R278 Other lack of coordination: Secondary | ICD-10-CM | POA: Diagnosis not present

## 2014-10-06 NOTE — Telephone Encounter (Signed)
I called her to see if someone had returned her call.  "Someone called me in regards to my dad already.  They told me to just keep an eye on it make sure it doesn't get infected or irritated.  My dad lives in a nursing facility so I was concerned.  Thanks for calling."

## 2014-10-07 DIAGNOSIS — M6281 Muscle weakness (generalized): Secondary | ICD-10-CM | POA: Diagnosis not present

## 2014-10-07 DIAGNOSIS — R262 Difficulty in walking, not elsewhere classified: Secondary | ICD-10-CM | POA: Diagnosis not present

## 2014-10-07 DIAGNOSIS — Z9181 History of falling: Secondary | ICD-10-CM | POA: Diagnosis not present

## 2014-10-07 DIAGNOSIS — R278 Other lack of coordination: Secondary | ICD-10-CM | POA: Diagnosis not present

## 2014-10-08 DIAGNOSIS — Z9181 History of falling: Secondary | ICD-10-CM | POA: Diagnosis not present

## 2014-10-08 DIAGNOSIS — M6281 Muscle weakness (generalized): Secondary | ICD-10-CM | POA: Diagnosis not present

## 2014-10-08 DIAGNOSIS — R278 Other lack of coordination: Secondary | ICD-10-CM | POA: Diagnosis not present

## 2014-10-08 DIAGNOSIS — R262 Difficulty in walking, not elsewhere classified: Secondary | ICD-10-CM | POA: Diagnosis not present

## 2014-10-12 ENCOUNTER — Ambulatory Visit
Admission: RE | Admit: 2014-10-12 | Discharge: 2014-10-12 | Disposition: A | Discharge status: Home / Self Care | Payer: Medicare Other | Source: Ambulatory Visit | Attending: Geriatric Medicine | Admitting: Geriatric Medicine

## 2014-10-12 ENCOUNTER — Other Ambulatory Visit: Payer: Self-pay | Admitting: Geriatric Medicine

## 2014-10-12 DIAGNOSIS — R112 Nausea with vomiting, unspecified: Secondary | ICD-10-CM | POA: Diagnosis not present

## 2014-10-12 DIAGNOSIS — I10 Essential (primary) hypertension: Secondary | ICD-10-CM | POA: Diagnosis not present

## 2014-10-12 DIAGNOSIS — D539 Nutritional anemia, unspecified: Secondary | ICD-10-CM | POA: Diagnosis not present

## 2014-10-12 DIAGNOSIS — I480 Paroxysmal atrial fibrillation: Secondary | ICD-10-CM | POA: Diagnosis not present

## 2014-10-12 DIAGNOSIS — R278 Other lack of coordination: Secondary | ICD-10-CM | POA: Diagnosis not present

## 2014-10-12 DIAGNOSIS — G40909 Epilepsy, unspecified, not intractable, without status epilepticus: Secondary | ICD-10-CM | POA: Diagnosis not present

## 2014-10-12 DIAGNOSIS — J189 Pneumonia, unspecified organism: Secondary | ICD-10-CM | POA: Diagnosis not present

## 2014-10-12 DIAGNOSIS — Z9181 History of falling: Secondary | ICD-10-CM | POA: Diagnosis not present

## 2014-10-12 DIAGNOSIS — R262 Difficulty in walking, not elsewhere classified: Secondary | ICD-10-CM | POA: Diagnosis not present

## 2014-10-12 DIAGNOSIS — Z79899 Other long term (current) drug therapy: Secondary | ICD-10-CM | POA: Diagnosis not present

## 2014-10-12 DIAGNOSIS — J8410 Pulmonary fibrosis, unspecified: Secondary | ICD-10-CM | POA: Diagnosis not present

## 2014-10-12 DIAGNOSIS — M6281 Muscle weakness (generalized): Secondary | ICD-10-CM | POA: Diagnosis not present

## 2014-10-12 DIAGNOSIS — K449 Diaphragmatic hernia without obstruction or gangrene: Secondary | ICD-10-CM | POA: Diagnosis not present

## 2014-10-14 ENCOUNTER — Ambulatory Visit (INDEPENDENT_AMBULATORY_CARE_PROVIDER_SITE_OTHER): Payer: Medicare Other | Admitting: Adult Health

## 2014-10-14 ENCOUNTER — Encounter: Payer: Self-pay | Admitting: Adult Health

## 2014-10-14 VITALS — BP 112/64 | HR 69 | Ht 74.0 in | Wt 202.0 lb

## 2014-10-14 DIAGNOSIS — R278 Other lack of coordination: Secondary | ICD-10-CM | POA: Diagnosis not present

## 2014-10-14 DIAGNOSIS — R569 Unspecified convulsions: Secondary | ICD-10-CM

## 2014-10-14 DIAGNOSIS — M6281 Muscle weakness (generalized): Secondary | ICD-10-CM | POA: Diagnosis not present

## 2014-10-14 DIAGNOSIS — Z9181 History of falling: Secondary | ICD-10-CM | POA: Diagnosis not present

## 2014-10-14 DIAGNOSIS — R262 Difficulty in walking, not elsewhere classified: Secondary | ICD-10-CM | POA: Diagnosis not present

## 2014-10-14 MED ORDER — LACOSAMIDE 50 MG PO TABS: ORAL_TABLET | ORAL | Status: DC

## 2014-10-14 NOTE — Patient Instructions (Signed)
Increase Vimpat to 1 tablet in the morning and 4 tablets in the PM.  If he is unable to tolerate please let us know.  Continue Keppra.

## 2014-10-14 NOTE — Progress Notes (Signed)
PATIENT: Barry Dean DOB: [birth year]  REASON FOR VISIT: follow up- seizures HISTORY FROM: patient  HISTORY OF PRESENT ILLNESS: Barry Dean is an 81 year old male with a history of seizures. He returns today for follow-up. At the last visit the patient was having staring spells accompanied by a laughing or crying sound. The patient's Vimpat was increased to 200 mg at bedtime. He continues to take Keppra 750 mg BID. Since then the daughter reports that he continues to have these episodes of making noises such as laughing or crying with staring spells that occur in the afternoons. The daughter notices this because she is there in the afternoons unsure if they happen any other time during the day. These episodes only last for seconds. She states that he has been in physical and occupational therapy to help with gait. He was just recently in the hospital at the end of January for the stomach flu. He continues to live at [facility].   HISTORY 07/13/14: Barry Dean is a 81 year old male with a history of seizures. He returns today for follow-up. He is currently taking Keppra and vimpat and tolerating those well. He denies any recent seizures. Although his daughter states he has been having episodes where he sounds like he is giggling or crying but does not appear to be laughing or crying. He will stare out when he is doing it. It will last just seconds. This started about 6 months ago and usually occurs at least 1 a week.   He currently lives at [facility]. He requires assistance with ADLs. In September he went into the hospital for pneumonia. He went back to the hospital several days later for a fall but did not sustain any injuries. He was then referred for physical therapy at [place].  HISTORY 07/09/13 (WILLIS): Barry Dean is an 81 year old left-handed white male with a history of a seizure disorder. The patient has done quite well on Keppra and Vimpat. The patient currently is  residing in [facility], and he has done relatively well. The patient had one fall in May of 2013, when he fell backwards. The patient did not sustain significant injury. The patient was in the hospital in March of 2014 with a pneumonia and colitis. The patient is tolerating the seizure medications well. Again, the patient's family does not report any seizures since he was seen last in February 2013. The patient remains relatively active, walking with a walker  REVIEW OF SYSTEMS: Out of a complete 14 system review of symptoms, the patient complains only of the following symptoms, and all other reviewed systems are negative.  Appetite change, fever, hearing loss, drooling, vomiting, snoring, walking difficulty, seizure    ALLERGIES: Allergies  Allergen Reactions  . Topamax Other (See Comments)    Unknown reaction  . Uroxatral [Alfuzosin Hydrochloride] Other (See Comments)    Unknown reaction  . Valium Other (See Comments)    Unknown reaction    HOME MEDICATIONS: Outpatient Prescriptions Prior to Visit  Medication Sig Dispense Refill  . acetaminophen (TYLENOL) 500 MG tablet Take 500 mg by mouth every 6 (six) hours as needed for pain. Not to exceed 3 gms of acetaminophen in 24 hours    . amLODipine-benazepril (LOTREL) 5-20 MG per capsule Take 1 capsule by mouth daily.    Marland Kitchen aspirin 81 MG chewable tablet Chew 1 tablet (81 mg total) by mouth every evening.    . cephALEXin (KEFLEX) 500 MG capsule Take 1 capsule (500 mg  total) by mouth 4 (four) times daily. 28 capsule 0  . Emollient (LUBRIDERM ADVANCED THERAPY) LOTN Apply 1 application topically daily. Apply to feet after bathing every day    . HYDROcodone-acetaminophen (NORCO/VICODIN) 5-325 MG per tablet Take 1 tablet by mouth every 8 (eight) hours as needed for moderate pain. 15 tablet 0  . ipratropium-albuterol (DUONEB) 0.5-2.5 (3) MG/3ML SOLN Inhale 3 mLs into the lungs 2 (two) times daily.     . iron polysaccharides (NIFEREX) 150 MG  capsule Take 150 mg by mouth daily.    Marland Kitchen labetalol (NORMODYNE) 100 MG tablet Take 100 mg by mouth 2 (two) times daily.     Marland Kitchen lacosamide (VIMPAT) 200 MG TABS tablet Take 1 tablet (200 mg total) by mouth at bedtime. 30 tablet 3  . levETIRAcetam (KEPPRA) 750 MG tablet Take 750 mg by mouth 2 (two) times daily.     . ondansetron (ZOFRAN) 4 MG tablet Take 1 tablet (4 mg total) by mouth every 8 (eight) hours as needed for nausea or vomiting. 20 tablet 0  . pantoprazole (PROTONIX) 40 MG tablet Take 1 tablet (40 mg total) by mouth 2 (two) times daily. 60 tablet 2  . polyethylene glycol (MIRALAX / GLYCOLAX) packet Take 17 g by mouth every other day.     . sennosides-docusate sodium (SENOKOT-S) 8.6-50 MG tablet Take 1 tablet by mouth at bedtime as needed for constipation.     No facility-administered medications prior to visit.    PAST MEDICAL HISTORY: Past Medical History  Diagnosis Date  . Hypertension   . Seizures   . A-fib   . Dehydration   . Deafness   . Anxiety   . Reflux   . Arthritis   . Depression   . Atrial fibrillation   . Tremor, essential   . Seizure   . BPH (benign prostatic hyperplasia)   . Syncope   . Deaf   . Melanoma   . GERD (gastroesophageal reflux disease)   . Mitral regurgitation   . Abnormality of gait     PAST SURGICAL HISTORY: Past Surgical History  Procedure Laterality Date  . Unknown    . Appendectomy    . Cataract extraction w/ intraocular lens  implant, bilateral    . Esophagogastroduodenoscopy (egd) with propofol N/A 09/15/2014    Procedure: ESOPHAGOGASTRODUODENOSCOPY (EGD) WITH PROPOFOL;  Surgeon: [name], MD;  Location: [name] ENDOSCOPY;  Service: Endoscopy;  Laterality: N/A;    FAMILY HISTORY: Family History  Problem Relation Age of Onset  . Cancer Mother     Skin cancer  . Stroke Father   . Parkinsonism Sister   . Diabetes Brother     SOCIAL HISTORY: History   Social History  . Marital Status: Unknown    Spouse Name: N/A  . Number  of Children: N/A  . Years of Education: N/A   Occupational History  . Not on file.   Social History Main Topics  . Smoking status: Never Smoker   . Smokeless tobacco: Not on file  . Alcohol Use: No  . Drug Use: No  . Sexual Activity: Not on file   Other Topics Concern  . Not on file   Social History Narrative   ** Merged History Encounter **          PHYSICAL EXAM  Filed Vitals:   10/14/14 1038  BP: 112/64  Pulse: 69  Height: 6\' 2"  (1.88 m)  Weight: 202 lb (91.627 kg)   Body mass index is 25.92  kg/(m^2).  Generalized: Well developed, in no acute distress   Neurological examination  Mentation: Alert oriented to time, place, history taking. Follows all commands speech and language fluent Cranial nerve II-XII:  Extraocular movements were full, visual field were full on confrontational test. Facial sensation and strength were normal. Head turning and shoulder shrug  were normal and symmetric. Motor: The motor testing reveals 5 over 5 strength of all 4 extremities. Good symmetric motor tone is noted throughout.  Sensory: Sensory testing is intact to soft touch on all 4 extremities. No evidence of extinction is noted.  Coordination: Cerebellar testing reveals good finger-nose-finger and heel-to-shin bilaterally.  Gait and station: Gait is slow, starts ambulating by taking small steps then transitions to a wider stride. Tandem gait not attempted.   Reflexes: Deep tendon reflexes are symmetric and normal bilaterally.    DIAGNOSTIC DATA (LABS, IMAGING, TESTING) - I reviewed patient records, labs, notes, testing and imaging myself where available.  Lab Results  Component Value Date   WBC 5.7 09/17/2014   HGB 11.2* 09/17/2014   HCT 32.3* 09/17/2014   MCV 92.6 09/17/2014   PLT 206 09/17/2014      Component Value Date/Time   NA 132* 09/17/2014 0413   K 3.6 09/17/2014 0413   CL 98 09/17/2014 0413   CO2 29 09/17/2014 0413   GLUCOSE 103* 09/17/2014 0413   BUN 12  09/17/2014 0413   CREATININE 0.88 09/17/2014 0413   CALCIUM 8.3* 09/17/2014 0413   PROT 5.6* 09/17/2014 0413   ALBUMIN 2.9* 09/17/2014 0413   AST 43* 09/17/2014 0413   ALT 41 09/17/2014 0413   ALKPHOS 54 09/17/2014 0413   BILITOT 0.8 09/17/2014 0413   GFRNONAA 74* 09/17/2014 0413   GFRAA 41* 09/17/2014 0413   Lab Results  Component Value Date   VITAMINB12 366 11/11/2012   Lab Results  Component Value Date   TSH 1.880 04/24/2014      ASSESSMENT AND PLAN 81 y.o. year old male  has a past medical history of Hypertension; Seizures; A-fib; Dehydration; Deafness; Anxiety; Reflux; Arthritis; Depression; Atrial fibrillation; Tremor, essential; Seizure; BPH (benign prostatic hyperplasia); Syncope; Deaf; Melanoma; GERD (gastroesophageal reflux disease); Mitral regurgitation; and Abnormality of gait. here with :  1. Seizures  According to the patient continues to have staring spells accompanied with a laughing or crying sound. I will increase the Vimpat to 50 mg in the morning and 200 mg in the evening. I have advised the daughter that if he isn't able to tolerate this she should let me know. The patient will continue taking Keppra 750 mg twice a day. She will also let me know if his seizures increased. The patient will follow up in 4-5 months with Dr. [name].  Ward Givens, MSN, NP-C 10/14/2014, 10:51 AM [facility] [address], [place], [place] [address] [phone]  Note: This document was prepared with digital dictation and possible smart phrase technology. Any transcriptional errors that result from this process are unintentional.

## 2014-10-14 NOTE — Progress Notes (Signed)
I have read the note, and I agree with the clinical assessment and plan.  Opaline Reyburn KEITH   

## 2014-10-15 DIAGNOSIS — M6281 Muscle weakness (generalized): Secondary | ICD-10-CM | POA: Diagnosis not present

## 2014-10-15 DIAGNOSIS — R262 Difficulty in walking, not elsewhere classified: Secondary | ICD-10-CM | POA: Diagnosis not present

## 2014-10-15 DIAGNOSIS — R278 Other lack of coordination: Secondary | ICD-10-CM | POA: Diagnosis not present

## 2014-10-15 DIAGNOSIS — Z9181 History of falling: Secondary | ICD-10-CM | POA: Diagnosis not present

## 2014-10-19 DIAGNOSIS — Z9181 History of falling: Secondary | ICD-10-CM | POA: Diagnosis not present

## 2014-10-19 DIAGNOSIS — R262 Difficulty in walking, not elsewhere classified: Secondary | ICD-10-CM | POA: Diagnosis not present

## 2014-10-19 DIAGNOSIS — R278 Other lack of coordination: Secondary | ICD-10-CM | POA: Diagnosis not present

## 2014-10-19 DIAGNOSIS — M6281 Muscle weakness (generalized): Secondary | ICD-10-CM | POA: Diagnosis not present

## 2014-10-20 DIAGNOSIS — M6281 Muscle weakness (generalized): Secondary | ICD-10-CM | POA: Diagnosis not present

## 2014-10-20 DIAGNOSIS — R278 Other lack of coordination: Secondary | ICD-10-CM | POA: Diagnosis not present

## 2014-10-20 DIAGNOSIS — Z9181 History of falling: Secondary | ICD-10-CM | POA: Diagnosis not present

## 2014-10-20 DIAGNOSIS — R262 Difficulty in walking, not elsewhere classified: Secondary | ICD-10-CM | POA: Diagnosis not present

## 2014-10-21 DIAGNOSIS — M6281 Muscle weakness (generalized): Secondary | ICD-10-CM | POA: Diagnosis not present

## 2014-10-21 DIAGNOSIS — R262 Difficulty in walking, not elsewhere classified: Secondary | ICD-10-CM | POA: Diagnosis not present

## 2014-10-21 DIAGNOSIS — Z9181 History of falling: Secondary | ICD-10-CM | POA: Diagnosis not present

## 2014-10-21 DIAGNOSIS — R278 Other lack of coordination: Secondary | ICD-10-CM | POA: Diagnosis not present

## 2014-10-26 DIAGNOSIS — R262 Difficulty in walking, not elsewhere classified: Secondary | ICD-10-CM | POA: Diagnosis not present

## 2014-10-26 DIAGNOSIS — R278 Other lack of coordination: Secondary | ICD-10-CM | POA: Diagnosis not present

## 2014-10-26 DIAGNOSIS — M6281 Muscle weakness (generalized): Secondary | ICD-10-CM | POA: Diagnosis not present

## 2014-10-26 DIAGNOSIS — Z9181 History of falling: Secondary | ICD-10-CM | POA: Diagnosis not present

## 2014-10-27 DIAGNOSIS — M6281 Muscle weakness (generalized): Secondary | ICD-10-CM | POA: Diagnosis not present

## 2014-10-27 DIAGNOSIS — R278 Other lack of coordination: Secondary | ICD-10-CM | POA: Diagnosis not present

## 2014-10-27 DIAGNOSIS — R262 Difficulty in walking, not elsewhere classified: Secondary | ICD-10-CM | POA: Diagnosis not present

## 2014-10-27 DIAGNOSIS — Z9181 History of falling: Secondary | ICD-10-CM | POA: Diagnosis not present

## 2014-10-28 DIAGNOSIS — M6281 Muscle weakness (generalized): Secondary | ICD-10-CM | POA: Diagnosis not present

## 2014-10-28 DIAGNOSIS — R262 Difficulty in walking, not elsewhere classified: Secondary | ICD-10-CM | POA: Diagnosis not present

## 2014-10-28 DIAGNOSIS — Z9181 History of falling: Secondary | ICD-10-CM | POA: Diagnosis not present

## 2014-10-28 DIAGNOSIS — R278 Other lack of coordination: Secondary | ICD-10-CM | POA: Diagnosis not present

## 2014-11-01 DIAGNOSIS — M6281 Muscle weakness (generalized): Secondary | ICD-10-CM | POA: Diagnosis not present

## 2014-11-01 DIAGNOSIS — R262 Difficulty in walking, not elsewhere classified: Secondary | ICD-10-CM | POA: Diagnosis not present

## 2014-11-01 DIAGNOSIS — Z9181 History of falling: Secondary | ICD-10-CM | POA: Diagnosis not present

## 2014-11-01 DIAGNOSIS — R278 Other lack of coordination: Secondary | ICD-10-CM | POA: Diagnosis not present

## 2014-11-02 DIAGNOSIS — M6281 Muscle weakness (generalized): Secondary | ICD-10-CM | POA: Diagnosis not present

## 2014-11-02 DIAGNOSIS — R278 Other lack of coordination: Secondary | ICD-10-CM | POA: Diagnosis not present

## 2014-11-02 DIAGNOSIS — R262 Difficulty in walking, not elsewhere classified: Secondary | ICD-10-CM | POA: Diagnosis not present

## 2014-11-02 DIAGNOSIS — Z9181 History of falling: Secondary | ICD-10-CM | POA: Diagnosis not present

## 2014-11-03 DIAGNOSIS — Z9181 History of falling: Secondary | ICD-10-CM | POA: Diagnosis not present

## 2014-11-03 DIAGNOSIS — R278 Other lack of coordination: Secondary | ICD-10-CM | POA: Diagnosis not present

## 2014-11-03 DIAGNOSIS — B351 Tinea unguium: Secondary | ICD-10-CM | POA: Diagnosis not present

## 2014-11-03 DIAGNOSIS — M6281 Muscle weakness (generalized): Secondary | ICD-10-CM | POA: Diagnosis not present

## 2014-11-03 DIAGNOSIS — R262 Difficulty in walking, not elsewhere classified: Secondary | ICD-10-CM | POA: Diagnosis not present

## 2014-11-04 DIAGNOSIS — M6281 Muscle weakness (generalized): Secondary | ICD-10-CM | POA: Diagnosis not present

## 2014-11-04 DIAGNOSIS — R278 Other lack of coordination: Secondary | ICD-10-CM | POA: Diagnosis not present

## 2014-11-04 DIAGNOSIS — Z9181 History of falling: Secondary | ICD-10-CM | POA: Diagnosis not present

## 2014-11-04 DIAGNOSIS — R262 Difficulty in walking, not elsewhere classified: Secondary | ICD-10-CM | POA: Diagnosis not present

## 2014-11-10 ENCOUNTER — Ambulatory Visit: Payer: Medicare Other | Admitting: Podiatry

## 2014-11-12 ENCOUNTER — Encounter (HOSPITAL_COMMUNITY): Payer: Self-pay | Admitting: Emergency Medicine

## 2014-11-12 ENCOUNTER — Emergency Department (HOSPITAL_COMMUNITY): Payer: Medicare Other

## 2014-11-12 ENCOUNTER — Emergency Department (HOSPITAL_COMMUNITY)
Admission: EM | Admit: 2014-11-12 | Discharge: 2014-11-12 | Disposition: A | Discharge status: Home / Self Care | Payer: Medicare Other | Attending: Emergency Medicine | Admitting: Emergency Medicine

## 2014-11-12 DIAGNOSIS — Z7951 Long term (current) use of inhaled steroids: Secondary | ICD-10-CM | POA: Insufficient documentation

## 2014-11-12 DIAGNOSIS — Z87438 Personal history of other diseases of male genital organs: Secondary | ICD-10-CM | POA: Insufficient documentation

## 2014-11-12 DIAGNOSIS — F0390 Unspecified dementia without behavioral disturbance: Secondary | ICD-10-CM | POA: Insufficient documentation

## 2014-11-12 DIAGNOSIS — K219 Gastro-esophageal reflux disease without esophagitis: Secondary | ICD-10-CM | POA: Diagnosis not present

## 2014-11-12 DIAGNOSIS — Z79899 Other long term (current) drug therapy: Secondary | ICD-10-CM | POA: Diagnosis not present

## 2014-11-12 DIAGNOSIS — I10 Essential (primary) hypertension: Secondary | ICD-10-CM | POA: Diagnosis not present

## 2014-11-12 DIAGNOSIS — R55 Syncope and collapse: Secondary | ICD-10-CM | POA: Diagnosis not present

## 2014-11-12 DIAGNOSIS — I4891 Unspecified atrial fibrillation: Secondary | ICD-10-CM | POA: Diagnosis not present

## 2014-11-12 DIAGNOSIS — Z7982 Long term (current) use of aspirin: Secondary | ICD-10-CM | POA: Insufficient documentation

## 2014-11-12 DIAGNOSIS — Z8639 Personal history of other endocrine, nutritional and metabolic disease: Secondary | ICD-10-CM | POA: Insufficient documentation

## 2014-11-12 DIAGNOSIS — M1990 Unspecified osteoarthritis, unspecified site: Secondary | ICD-10-CM | POA: Insufficient documentation

## 2014-11-12 DIAGNOSIS — Z85820 Personal history of malignant melanoma of skin: Secondary | ICD-10-CM | POA: Insufficient documentation

## 2014-11-12 DIAGNOSIS — J811 Chronic pulmonary edema: Secondary | ICD-10-CM | POA: Diagnosis not present

## 2014-11-12 DIAGNOSIS — G40909 Epilepsy, unspecified, not intractable, without status epilepticus: Secondary | ICD-10-CM | POA: Diagnosis not present

## 2014-11-12 DIAGNOSIS — H9190 Unspecified hearing loss, unspecified ear: Secondary | ICD-10-CM | POA: Insufficient documentation

## 2014-11-12 DIAGNOSIS — R40242 Glasgow coma scale score 9-12: Secondary | ICD-10-CM | POA: Diagnosis not present

## 2014-11-12 DIAGNOSIS — R4020 Unspecified coma: Secondary | ICD-10-CM | POA: Diagnosis not present

## 2014-11-12 LAB — CBC WITH DIFFERENTIAL/PLATELET
BASOS ABS: 0 10*3/uL (ref 0.0–0.1)
Basophils Relative: 0 % (ref 0–1)
EOS PCT: 1 % (ref 0–5)
Eosinophils Absolute: 0.1 10*3/uL (ref 0.0–0.7)
HEMATOCRIT: 34.3 % — AB (ref 39.0–52.0)
HEMOGLOBIN: 11.3 g/dL — AB (ref 13.0–17.0)
Lymphocytes Relative: 26 % (ref 12–46)
Lymphs Abs: 1.6 10*3/uL (ref 0.7–4.0)
MCH: 30 pg (ref 26.0–34.0)
MCHC: 32.9 g/dL (ref 30.0–36.0)
MCV: 91 fL (ref 78.0–100.0)
MONO ABS: 0.6 10*3/uL (ref 0.1–1.0)
MONOS PCT: 10 % (ref 3–12)
NEUTROS ABS: 3.7 10*3/uL (ref 1.7–7.7)
Neutrophils Relative %: 63 % (ref 43–77)
Platelets: 240 10*3/uL (ref 150–400)
RBC: 3.77 MIL/uL — AB (ref 4.22–5.81)
RDW: 13.6 % (ref 11.5–15.5)
WBC: 6 10*3/uL (ref 4.0–10.5)

## 2014-11-12 LAB — URINALYSIS, ROUTINE W REFLEX MICROSCOPIC
Bilirubin Urine: NEGATIVE
GLUCOSE, UA: NEGATIVE mg/dL
Hgb urine dipstick: NEGATIVE
KETONES UR: NEGATIVE mg/dL
Leukocytes, UA: NEGATIVE
Nitrite: NEGATIVE
PH: 7 (ref 5.0–8.0)
PROTEIN: NEGATIVE mg/dL
Specific Gravity, Urine: 1.01 (ref 1.005–1.030)
Urobilinogen, UA: 1 mg/dL (ref 0.0–1.0)

## 2014-11-12 LAB — BASIC METABOLIC PANEL
Anion gap: 6 (ref 5–15)
BUN: 10 mg/dL (ref 6–23)
CALCIUM: 9.1 mg/dL (ref 8.4–10.5)
CO2: 26 mmol/L (ref 19–32)
CREATININE: 1.05 mg/dL (ref 0.50–1.35)
Chloride: 100 mmol/L (ref 96–112)
GFR calc Af Amer: 70 mL/min — ABNORMAL LOW (ref >90)
GFR calc non Af Amer: 61 mL/min — ABNORMAL LOW (ref >90)
GLUCOSE: 96 mg/dL (ref 70–99)
Potassium: 4.3 mmol/L (ref 3.5–5.1)
Sodium: 132 mmol/L — ABNORMAL LOW (ref 135–145)

## 2014-11-12 LAB — BRAIN NATRIURETIC PEPTIDE: B NATRIURETIC PEPTIDE 5: 98.7 pg/mL (ref 0.0–100.0)

## 2014-11-12 NOTE — ED Notes (Signed)
Per EMS: Pt reports LOC on toilet this morning at [facility].  Upon ems arrival no palpable pulse was noted, but with sternal rub pt became alert.  Pt alert and oriented at this time, received 500 bolus en route.  Pt was pale and diaphoretic with sats in high 80's, pt placed on 2L [place].  Pt alert and oriented, no fall noted.  Pt on beta blocker.

## 2014-11-12 NOTE — ED Provider Notes (Signed)
CSN: [id number]     Arrival date & time 11/12/14  7902 History   First MD Initiated Contact with Patient 11/12/14 [phone]     Chief Complaint  Patient presents with  . Loss of Consciousness     (Consider location/radiation/quality/duration/timing/severity/associated sxs/prior Treatment) HPI  81 year old male presents from his nursing home after losing consciousness while sitting on the toilet. The patient is demented and unable to provide a history. Per the nursing home the patient is at his normal mental baseline. They relate that the patient usually gets up and walks to the bathroom by himself every morning and then has a call bell is done. Today he has to call bell and shortly thereafter they came to help him get off the toilet and found him slumped over on the toilet. He does not appear to his head and did not fall off of the toilet. He then later regained consciousness. Own of any complaints. He's not been sick recently per the nursing home.  Past Medical History  Diagnosis Date  . Hypertension   . Seizures   . A-fib   . Dehydration   . Deafness   . Anxiety   . Reflux   . Arthritis   . Depression   . Atrial fibrillation   . Tremor, essential   . Seizure   . BPH (benign prostatic hyperplasia)   . Syncope   . Deaf   . Melanoma   . GERD (gastroesophageal reflux disease)   . Mitral regurgitation   . Abnormality of gait    Past Surgical History  Procedure Laterality Date  . Unknown    . Appendectomy    . Cataract extraction w/ intraocular lens  implant, bilateral    . Esophagogastroduodenoscopy (egd) with propofol N/A 09/15/2014    Procedure: ESOPHAGOGASTRODUODENOSCOPY (EGD) WITH PROPOFOL;  Surgeon: [name], MD;  Location: [facility] ENDOSCOPY;  Service: Endoscopy;  Laterality: N/A;   Family History  Problem Relation Age of Onset  . Cancer Mother     Skin cancer  . Stroke Father   . Parkinsonism Sister   . Diabetes Brother    History  Substance Use Topics  . Smoking  status: Never Smoker   . Smokeless tobacco: Not on file  . Alcohol Use: No    Review of Systems  Unable to perform ROS: Dementia      Allergies  Topamax; Uroxatral; and Valium  Home Medications   Prior to Admission medications   Medication Sig Start Date End Date Taking? Authorizing Provider  acetaminophen (TYLENOL) 500 MG tablet Take 500 mg by mouth every 6 (six) hours as needed for pain. Not to exceed 3 gms of acetaminophen in 24 hours   Yes Historical Provider, MD  amLODipine-benazepril (LOTREL) 5-20 MG per capsule Take 1 capsule by mouth daily.   Yes Historical Provider, MD  aspirin 81 MG chewable tablet Chew 1 tablet (81 mg total) by mouth every evening. 09/22/14  Yes Thurnell Lose, MD  Emollient Jilda Panda ADVANCED THERAPY) LOTN Apply 1 application topically daily. Apply to feet after bathing every day   Yes Historical Provider, MD  HYDROcodone-acetaminophen (NORCO/VICODIN) 5-325 MG per tablet Take 1 tablet by mouth every 8 (eight) hours as needed for moderate pain. 09/17/14  Yes Thurnell Lose, MD  ipratropium-albuterol (DUONEB) 0.5-2.5 (3) MG/3ML SOLN Inhale 3 mLs into the lungs 2 (two) times daily.  07/05/14  Yes Historical Provider, MD  iron polysaccharides (NIFEREX) 150 MG capsule Take 150 mg by mouth daily.  Yes Historical Provider, MD  labetalol (NORMODYNE) 100 MG tablet Take 100 mg by mouth 2 (two) times daily.    Yes Historical Provider, MD  levETIRAcetam (KEPPRA) 750 MG tablet Take 750 mg by mouth 2 (two) times daily.  07/12/14  Yes Historical Provider, MD  ondansetron (ZOFRAN) 4 MG tablet Take 1 tablet (4 mg total) by mouth every 8 (eight) hours as needed for nausea or vomiting. 09/17/14  Yes Thurnell Lose, MD  pantoprazole (PROTONIX) 40 MG tablet Take 1 tablet (40 mg total) by mouth 2 (two) times daily. 09/17/14  Yes Thurnell Lose, MD  polyethylene glycol (MIRALAX / GLYCOLAX) packet Take 17 g by mouth every other day.    Yes Historical Provider, MD   sennosides-docusate sodium (SENOKOT-S) 8.6-50 MG tablet Take 1 tablet by mouth at bedtime as needed for constipation.   Yes Historical Provider, MD  VIMPAT 200 MG TABS tablet Take 200 mg by mouth at bedtime. 09/18/14  Yes Historical Provider, MD  cephALEXin (KEFLEX) 500 MG capsule Take 1 capsule (500 mg total) by mouth 4 (four) times daily. Patient not taking: Reported on 11/12/2014 09/17/14   Thurnell Lose, MD  lacosamide (VIMPAT) 50 MG TABS tablet Take 1 tablet in the AM (50 mg) and 4 tablets (200 mg ) in the PM. Patient not taking: Reported on 11/12/2014 10/14/14   Megan P Millikan, NP   BP 150/63 mmHg  Pulse 57  Temp(Src) 94.4 F (34.7 C) (Rectal)  Resp 22  SpO2 95% Physical Exam  Constitutional: He appears well-developed and well-nourished.  HENT:  Head: Normocephalic and atraumatic.  Right Ear: External ear normal.  Left Ear: External ear normal.  Nose: Nose normal.  Eyes: EOM are normal. Pupils are equal, round, and reactive to light. Right eye exhibits no discharge. Left eye exhibits no discharge.  Neck: Neck supple.  Cardiovascular: Normal rate, regular rhythm, normal heart sounds and intact distal pulses.   Pulmonary/Chest: Effort normal.  Abdominal: Soft. He exhibits no distension. There is no tenderness.  Musculoskeletal: He exhibits no edema.  Neurological: He is alert. He is disoriented. GCS eye subscore is 4. GCS verbal subscore is 4. GCS motor subscore is 6.  Normal and symmetric smile. Normal grip strength and movement in feet but does not cooperate well with neuro exam. No obvious focal deficits.  Skin: Skin is warm and dry.  Nursing note and vitals reviewed.   ED Course  Procedures (including critical care time) Labs Review Labs Reviewed  BASIC METABOLIC PANEL - Abnormal; Notable for the following:    Sodium 132 (*)    GFR calc non Af Amer 61 (*)    GFR calc Af Amer 70 (*)    All other components within normal limits  CBC WITH DIFFERENTIAL/PLATELET -  Abnormal; Notable for the following:    RBC 3.77 (*)    Hemoglobin 11.3 (*)    HCT 34.3 (*)    All other components within normal limits  URINALYSIS, ROUTINE W REFLEX MICROSCOPIC  BRAIN NATRIURETIC PEPTIDE    Imaging Review Dg Chest Port 1 View  11/12/2014   CLINICAL DATA:  Loss of consciousness. Hypertension and atrial fibrillation.  EXAM: PORTABLE CHEST - 1 VIEW  COMPARISON:  Two-view chest 10/12/2014.  FINDINGS: Heart size is upper limits of normal. A large hiatal hernia is again noted. Atherosclerotic calcifications are present at the aorta. Emphysematous changes are noted. There is mild superimposed edema. Bibasilar airspace disease likely reflects atelectasis. No significant consolidation is present. Posttraumatic  changes of the proximal left humerus are again noted.  IMPRESSION: 1. Heart upper limits of normal for size with mild edema suggesting developing congestive heart failure. 2. Atherosclerosis. 3. Large hiatal hernia.   Electronically Signed   By: San Morelle M.D.   On: 11/12/2014 10:07     EKG Interpretation   Date/Time:  Friday November 12 2014 08:54:05 EDT Ventricular Rate:  55 PR Interval:  197 QRS Duration: 91 QT Interval:  429 QTC Calculation: 410 R Axis:   102 Text Interpretation:  Sinus rbradycardia Right axis deviation rate is  slower compared to Jan 2016 Confirmed by Regenia Skeeter  MD, [place] [phone]) on  11/12/2014 9:03:53 AM      MDM   Final diagnoses:  Syncope, unspecified syncope type    Patient appears at his baseline, no signs of trauma and did not fall. Labs, EKG, and Xray benign. Does not clinically appear fluid overloaded. No issues while being watched in ED. Most likely vagal from bowel movement, but unable to confirm. I discussed workup and plan with his daughter and one of his Vanita Ingles, Willaim Bane, who after discussion of risks/benefits of admission vs going home prefers to go home and she will see him tomorrow and return if needed.   Sherwood Gambler,  MD 11/13/14 1037

## 2014-11-15 ENCOUNTER — Telehealth: Payer: Self-pay | Admitting: Neurology

## 2014-11-15 NOTE — Telephone Encounter (Signed)
Pt's daughter is calling wanting to check about increasing levETIRAcetam (KEPPRA) 750 MG tablet and lacosamide (VIMPAT) 50 MG TABS tablet.  She states patient was having some mini episodes of seizures over the weekend.  Please call and advise.

## 2014-11-15 NOTE — Telephone Encounter (Signed)
The patient is having episodes of laughing or crying that are brief, not associated with any alteration in consciousness or staring off. These could represent seizures, but I am not sure I would be extremely aggressive in treating these, as the medications themselves have significant side effects such as increased risk for falling. We will watch him for now.

## 2014-11-15 NOTE — Telephone Encounter (Signed)
I called, left a message, I will call back later. Apparently the patient is having ongoing seizures over the weekend.

## 2014-12-06 DIAGNOSIS — R296 Repeated falls: Secondary | ICD-10-CM | POA: Diagnosis not present

## 2014-12-06 DIAGNOSIS — Z9181 History of falling: Secondary | ICD-10-CM | POA: Diagnosis not present

## 2014-12-07 DIAGNOSIS — R296 Repeated falls: Secondary | ICD-10-CM | POA: Diagnosis not present

## 2014-12-07 DIAGNOSIS — Z9181 History of falling: Secondary | ICD-10-CM | POA: Diagnosis not present

## 2014-12-08 DIAGNOSIS — R296 Repeated falls: Secondary | ICD-10-CM | POA: Diagnosis not present

## 2014-12-08 DIAGNOSIS — Z9181 History of falling: Secondary | ICD-10-CM | POA: Diagnosis not present

## 2014-12-09 DIAGNOSIS — R296 Repeated falls: Secondary | ICD-10-CM | POA: Diagnosis not present

## 2014-12-09 DIAGNOSIS — Z9181 History of falling: Secondary | ICD-10-CM | POA: Diagnosis not present

## 2014-12-13 DIAGNOSIS — R296 Repeated falls: Secondary | ICD-10-CM | POA: Diagnosis not present

## 2014-12-13 DIAGNOSIS — Z9181 History of falling: Secondary | ICD-10-CM | POA: Diagnosis not present

## 2014-12-14 DIAGNOSIS — Z9181 History of falling: Secondary | ICD-10-CM | POA: Diagnosis not present

## 2014-12-14 DIAGNOSIS — R296 Repeated falls: Secondary | ICD-10-CM | POA: Diagnosis not present

## 2014-12-16 DIAGNOSIS — Z9181 History of falling: Secondary | ICD-10-CM | POA: Diagnosis not present

## 2014-12-16 DIAGNOSIS — R296 Repeated falls: Secondary | ICD-10-CM | POA: Diagnosis not present

## 2014-12-20 DIAGNOSIS — R296 Repeated falls: Secondary | ICD-10-CM | POA: Diagnosis not present

## 2014-12-20 DIAGNOSIS — Z9181 History of falling: Secondary | ICD-10-CM | POA: Diagnosis not present

## 2014-12-24 DIAGNOSIS — Z9181 History of falling: Secondary | ICD-10-CM | POA: Diagnosis not present

## 2014-12-24 DIAGNOSIS — R296 Repeated falls: Secondary | ICD-10-CM | POA: Diagnosis not present

## 2014-12-28 DIAGNOSIS — R296 Repeated falls: Secondary | ICD-10-CM | POA: Diagnosis not present

## 2014-12-28 DIAGNOSIS — Z9181 History of falling: Secondary | ICD-10-CM | POA: Diagnosis not present

## 2014-12-30 DIAGNOSIS — Z9181 History of falling: Secondary | ICD-10-CM | POA: Diagnosis not present

## 2014-12-30 DIAGNOSIS — R296 Repeated falls: Secondary | ICD-10-CM | POA: Diagnosis not present

## 2014-12-31 ENCOUNTER — Emergency Department (HOSPITAL_COMMUNITY)
Admission: EM | Admit: 2014-12-31 | Discharge: 2015-01-01 | Disposition: A | Discharge status: Home / Self Care | Payer: Medicare Other | Attending: Emergency Medicine | Admitting: Emergency Medicine

## 2014-12-31 ENCOUNTER — Encounter (HOSPITAL_COMMUNITY): Payer: Self-pay

## 2014-12-31 DIAGNOSIS — Z79899 Other long term (current) drug therapy: Secondary | ICD-10-CM | POA: Diagnosis not present

## 2014-12-31 DIAGNOSIS — Z87438 Personal history of other diseases of male genital organs: Secondary | ICD-10-CM | POA: Insufficient documentation

## 2014-12-31 DIAGNOSIS — K219 Gastro-esophageal reflux disease without esophagitis: Secondary | ICD-10-CM | POA: Insufficient documentation

## 2014-12-31 DIAGNOSIS — Y998 Other external cause status: Secondary | ICD-10-CM | POA: Insufficient documentation

## 2014-12-31 DIAGNOSIS — S0990XA Unspecified injury of head, initial encounter: Secondary | ICD-10-CM | POA: Diagnosis not present

## 2014-12-31 DIAGNOSIS — Z7982 Long term (current) use of aspirin: Secondary | ICD-10-CM | POA: Diagnosis not present

## 2014-12-31 DIAGNOSIS — M4802 Spinal stenosis, cervical region: Secondary | ICD-10-CM | POA: Diagnosis not present

## 2014-12-31 DIAGNOSIS — S199XXA Unspecified injury of neck, initial encounter: Secondary | ICD-10-CM | POA: Diagnosis not present

## 2014-12-31 DIAGNOSIS — H9190 Unspecified hearing loss, unspecified ear: Secondary | ICD-10-CM | POA: Diagnosis not present

## 2014-12-31 DIAGNOSIS — Z8639 Personal history of other endocrine, nutritional and metabolic disease: Secondary | ICD-10-CM | POA: Insufficient documentation

## 2014-12-31 DIAGNOSIS — W010XXA Fall on same level from slipping, tripping and stumbling without subsequent striking against object, initial encounter: Secondary | ICD-10-CM | POA: Diagnosis not present

## 2014-12-31 DIAGNOSIS — Y92129 Unspecified place in nursing home as the place of occurrence of the external cause: Secondary | ICD-10-CM | POA: Insufficient documentation

## 2014-12-31 DIAGNOSIS — I10 Essential (primary) hypertension: Secondary | ICD-10-CM | POA: Diagnosis not present

## 2014-12-31 DIAGNOSIS — G40909 Epilepsy, unspecified, not intractable, without status epilepticus: Secondary | ICD-10-CM | POA: Diagnosis not present

## 2014-12-31 DIAGNOSIS — Y9389 Activity, other specified: Secondary | ICD-10-CM | POA: Insufficient documentation

## 2014-12-31 DIAGNOSIS — W19XXXA Unspecified fall, initial encounter: Secondary | ICD-10-CM

## 2014-12-31 DIAGNOSIS — M1990 Unspecified osteoarthritis, unspecified site: Secondary | ICD-10-CM | POA: Insufficient documentation

## 2014-12-31 DIAGNOSIS — S7001XA Contusion of right hip, initial encounter: Secondary | ICD-10-CM | POA: Diagnosis not present

## 2014-12-31 DIAGNOSIS — Z8659 Personal history of other mental and behavioral disorders: Secondary | ICD-10-CM | POA: Insufficient documentation

## 2014-12-31 DIAGNOSIS — M79604 Pain in right leg: Secondary | ICD-10-CM | POA: Diagnosis not present

## 2014-12-31 DIAGNOSIS — Z85820 Personal history of malignant melanoma of skin: Secondary | ICD-10-CM | POA: Insufficient documentation

## 2014-12-31 DIAGNOSIS — S79911A Unspecified injury of right hip, initial encounter: Secondary | ICD-10-CM | POA: Diagnosis not present

## 2014-12-31 DIAGNOSIS — G20 Parkinson's disease: Secondary | ICD-10-CM | POA: Diagnosis not present

## 2014-12-31 DIAGNOSIS — R030 Elevated blood-pressure reading, without diagnosis of hypertension: Secondary | ICD-10-CM | POA: Diagnosis not present

## 2014-12-31 DIAGNOSIS — S299XXA Unspecified injury of thorax, initial encounter: Secondary | ICD-10-CM | POA: Diagnosis not present

## 2014-12-31 NOTE — ED Notes (Addendum)
Raheel Kunkle- [phone] (Daughter)

## 2014-12-31 NOTE — ED Notes (Signed)
Per EMS - pt fell at nursing home when getting snack this evening. Nursing home insisted on pt being evaluated at [facility] despite pt not wanting to come here. No injuries. Bruise on right upper leg. Nursing staff did not observe fall. CBG 117, bp 170/90, 70bpm, rr 16. Hx A.Fib, seizures, hard of hearing, Parkinson's, HTN. Pt has no complaints

## 2015-01-01 ENCOUNTER — Emergency Department (HOSPITAL_COMMUNITY): Payer: Medicare Other

## 2015-01-01 DIAGNOSIS — S299XXA Unspecified injury of thorax, initial encounter: Secondary | ICD-10-CM | POA: Diagnosis not present

## 2015-01-01 DIAGNOSIS — S7001XA Contusion of right hip, initial encounter: Secondary | ICD-10-CM | POA: Diagnosis not present

## 2015-01-01 DIAGNOSIS — S79911A Unspecified injury of right hip, initial encounter: Secondary | ICD-10-CM | POA: Diagnosis not present

## 2015-01-01 DIAGNOSIS — R259 Unspecified abnormal involuntary movements: Secondary | ICD-10-CM | POA: Diagnosis not present

## 2015-01-01 DIAGNOSIS — S199XXA Unspecified injury of neck, initial encounter: Secondary | ICD-10-CM | POA: Diagnosis not present

## 2015-01-01 DIAGNOSIS — S0990XA Unspecified injury of head, initial encounter: Secondary | ICD-10-CM | POA: Diagnosis not present

## 2015-01-01 NOTE — Discharge Instructions (Signed)
Please follow the directions provided.  Be sure to follow-up with your primary care provider to ensure you are getting better.  You may take tylenol or ibuprofen for pain.  Don't hesitate to return for any new, worsening or concerning symptoms.     WHEN SHOULD I SEEK IMMEDIATE MEDICAL CARE?  You should get help right away if:  You have confusion or drowsiness.  You feel sick to your stomach (nauseous) or have continued, forceful vomiting.  You have dizziness or unsteadiness that is getting worse.  You have severe, continued headaches not relieved by medicine. Only take over-the-counter or prescription medicines for pain, fever, or discomfort as directed by your health care provider.  You do not have normal function of the arms or legs or are unable to walk.  You notice changes in the black spots in the center of the colored part of your eye (pupil).  You have a clear or bloody fluid coming from your nose or ears.  You have a loss of vision. During the next 24 hours after the injury, you must stay with someone who can watch you for the warning signs. This person should contact local emergency services (911 in the [place] if you have seizures, you become unconscious, or you are unable to wake up.

## 2015-01-01 NOTE — ED Provider Notes (Signed)
1:00AM: At end of shift, received hand-off report from Dr. Thurnell Garbe.  Plan includes reviewing imaging and ensuring pt can ambulate without difficulty and then discharge back to nursing facility.   2:00AM: Imaging reviewed and no fractures or abnormalities noted. Pt was able to ambulate to and from the bathroom with assistance without difficulty. Pt is well-appearing, in no acute distress and vital signs reviewed and not concerning.  He appears safe to be discharged.  Discharge include follow-up with their PCP.  Return precautions provided.      Filed Vitals:   01/01/15 0145 01/01/15 0315 01/01/15 0345 01/01/15 0400  BP: 149/64 131/58 134/59 151/71  Pulse: 62 58 67 66  Temp:      TempSrc:      Resp: 16     SpO2: 95% 94% 95% 100%   Meds given in ED:  Medications - No data to display  Discharge Medication List as of 01/01/2015  2:37 AM        Britt Bottom, NP 01/01/15 [place], MD 01/03/15 8832

## 2015-01-01 NOTE — ED Provider Notes (Signed)
CSN: [id number]     Arrival date & time 12/31/14  2304 History   First MD Initiated Contact with Patient 12/31/14 2317     Chief Complaint  Patient presents with  . Fall     HPI Pt was seen at 2330. Per EMS, NH report and pt: c/o sudden onset and resolution of one episode of slip and fall while getting a snack this evening at the NH. Pt did not want to come to the ED for evaluation, but NH insisted. Pt with ecchymosis to right hip. pt denies any complaints. Denies syncope, no neck or back pain, no CP/SOB, no abd pain, no focal motor weakness, no tingling/numbness in extremities.    Past Medical History  Diagnosis Date  . Hypertension   . Seizures   . A-fib   . Dehydration   . Deafness   . Anxiety   . Reflux   . Arthritis   . Depression   . Atrial fibrillation   . Tremor, essential   . Seizure   . BPH (benign prostatic hyperplasia)   . Syncope   . Deaf   . Melanoma   . GERD (gastroesophageal reflux disease)   . Mitral regurgitation   . Abnormality of gait    Past Surgical History  Procedure Laterality Date  . Unknown    . Appendectomy    . Cataract extraction w/ intraocular lens  implant, bilateral    . Esophagogastroduodenoscopy (egd) with propofol N/A 09/15/2014    Procedure: ESOPHAGOGASTRODUODENOSCOPY (EGD) WITH PROPOFOL;  Surgeon: [name], MD;  Location: [facility] ENDOSCOPY;  Service: Endoscopy;  Laterality: N/A;   Family History  Problem Relation Age of Onset  . Cancer Mother     Skin cancer  . Stroke Father   . Parkinsonism Sister   . Diabetes Brother    History  Substance Use Topics  . Smoking status: Never Smoker   . Smokeless tobacco: Not on file  . Alcohol Use: No    Review of Systems ROS: Statement: All systems negative except as marked or noted in the HPI; Constitutional: Negative for fever and chills. ; ; Eyes: Negative for eye pain, redness and discharge. ; ; ENMT: Negative for ear pain, hoarseness, nasal congestion, sinus pressure and sore throat.  ; ; Cardiovascular: Negative for chest pain, palpitations, diaphoresis, dyspnea and peripheral edema. ; ; Respiratory: Negative for cough, wheezing and stridor. ; ; Gastrointestinal: Negative for nausea, vomiting, diarrhea, abdominal pain, blood in stool, hematemesis, jaundice and rectal bleeding. . ; ; Genitourinary: Negative for dysuria, flank pain and hematuria. ; ; Musculoskeletal: Negative for back pain and neck pain. Negative for swelling and trauma.; ; Skin: +bruise right hip. Negative for pruritus, rash, abrasions, blisters, and skin lesion.; ; Neuro: Negative for headache, lightheadedness and neck stiffness. Negative for weakness, altered level of consciousness , altered mental status, extremity weakness, paresthesias, involuntary movement, seizure and syncope.      Allergies  Topamax; Uroxatral; and Valium  Home Medications   Prior to Admission medications   Medication Sig Start Date End Date Taking? Authorizing Provider  acetaminophen (TYLENOL) 500 MG tablet Take 500 mg by mouth every 6 (six) hours as needed for pain. Not to exceed 3 gms of acetaminophen in 24 hours    Historical Provider, MD  amLODipine-benazepril (LOTREL) 5-20 MG per capsule Take 1 capsule by mouth daily.    Historical Provider, MD  aspirin 81 MG chewable tablet Chew 1 tablet (81 mg total) by mouth every evening. 09/22/14  Thurnell Lose, MD  cephALEXin (KEFLEX) 500 MG capsule Take 1 capsule (500 mg total) by mouth 4 (four) times daily. Patient not taking: Reported on 11/12/2014 09/17/14   Thurnell Lose, MD  Emollient [name] ADVANCED THERAPY) LOTN Apply 1 application topically daily. Apply to feet after bathing every day    Historical Provider, MD  HYDROcodone-acetaminophen (NORCO/VICODIN) 5-325 MG per tablet Take 1 tablet by mouth every 8 (eight) hours as needed for moderate pain. 09/17/14   Thurnell Lose, MD  ipratropium-albuterol (DUONEB) 0.5-2.5 (3) MG/3ML SOLN Inhale 3 mLs into the lungs 2 (two) times  daily.  07/05/14   Historical Provider, MD  iron polysaccharides (NIFEREX) 150 MG capsule Take 150 mg by mouth daily.    Historical Provider, MD  labetalol (NORMODYNE) 100 MG tablet Take 100 mg by mouth 2 (two) times daily.     Historical Provider, MD  lacosamide (VIMPAT) 50 MG TABS tablet Take 1 tablet in the AM (50 mg) and 4 tablets (200 mg ) in the PM. Patient not taking: Reported on 11/12/2014 10/14/14   Trudie Buckler, NP  levETIRAcetam (KEPPRA) 750 MG tablet Take 750 mg by mouth 2 (two) times daily.  07/12/14   Historical Provider, MD  ondansetron (ZOFRAN) 4 MG tablet Take 1 tablet (4 mg total) by mouth every 8 (eight) hours as needed for nausea or vomiting. 09/17/14   Thurnell Lose, MD  pantoprazole (PROTONIX) 40 MG tablet Take 1 tablet (40 mg total) by mouth 2 (two) times daily. 09/17/14   Thurnell Lose, MD  polyethylene glycol (MIRALAX / GLYCOLAX) packet Take 17 g by mouth every other day.     Historical Provider, MD  sennosides-docusate sodium (SENOKOT-S) 8.6-50 MG tablet Take 1 tablet by mouth at bedtime as needed for constipation.    Historical Provider, MD  VIMPAT 200 MG TABS tablet Take 200 mg by mouth at bedtime. 09/18/14   Historical Provider, MD   BP 149/68 mmHg  Pulse 61  Temp(Src) 98.3 F (36.8 C) (Oral)  Resp 24  SpO2 92% Physical Exam  2335: Physical examination: Vital signs and O2 SAT: Reviewed; Constitutional: Well developed, Well nourished, Well hydrated, In no acute distress; Head and Face: Normocephalic, Atraumatic; Eyes: EOMI, PERRL, No scleral icterus; ENMT: Mouth and pharynx normal, Left TM normal, Right TM normal, Mucous membranes moist; Neck: Supple, Trachea midline; Spine: No midline CS, TS, LS tenderness.; Cardiovascular: Regular rate and rhythm, No gallop; Respiratory: Breath sounds clear & equal bilaterally, No wheezes, Normal respiratory effort/excursion; Chest: Nontender, No deformity, Movement normal, No crepitus, No abrasions or ecchymosis.; Abdomen: Soft,  Nontender, Nondistended, Normal bowel sounds, No abrasions or ecchymosis.; Genitourinary: No CVA tenderness;; Extremities: +right lateral hip ecchymosis, no deformity, no open wounds. NMS intact distally RLE. NT right knee/ankle/foot. Full range of motion major/large joints of bilat UE's and LE's without pain or tenderness to palp, no deformities. Neurovascularly intact, Pulses normal, No tenderness, No edema, Pelvis stable; Neuro: Awake, alert. +HOH, otherwise major CN grossly intact. Speech clear. No facial droop. Moves all extremities spontaneously and to command without apparent gross focal motor deficits.; Skin: Color normal, Warm, Dry   ED Course  Procedures     EKG Interpretation None      MDM  MDM Reviewed: previous chart, nursing note and vitals     0055:  CT and XR pending. If without acute abnl, pt will need to ambulate before discharge back to NH. Sign out to NP Tysinger.    Francine Graven,  DO 01/02/15 1616

## 2015-01-01 NOTE — ED Notes (Signed)
Pt ambulated with 2 assists - shuffling gait.

## 2015-01-01 NOTE — ED Notes (Signed)
Attempted to call report to SNF, unable to reach nurse

## 2015-01-03 DIAGNOSIS — R296 Repeated falls: Secondary | ICD-10-CM | POA: Diagnosis not present

## 2015-01-03 DIAGNOSIS — Z9181 History of falling: Secondary | ICD-10-CM | POA: Diagnosis not present

## 2015-01-04 ENCOUNTER — Telehealth (HOSPITAL_BASED_OUTPATIENT_CLINIC_OR_DEPARTMENT_OTHER): Payer: Self-pay | Admitting: Emergency Medicine

## 2015-01-04 DIAGNOSIS — Z9181 History of falling: Secondary | ICD-10-CM | POA: Diagnosis not present

## 2015-01-04 DIAGNOSIS — R296 Repeated falls: Secondary | ICD-10-CM | POA: Diagnosis not present

## 2015-01-06 DIAGNOSIS — Z9181 History of falling: Secondary | ICD-10-CM | POA: Diagnosis not present

## 2015-01-06 DIAGNOSIS — R296 Repeated falls: Secondary | ICD-10-CM | POA: Diagnosis not present

## 2015-01-11 DIAGNOSIS — Z9181 History of falling: Secondary | ICD-10-CM | POA: Diagnosis not present

## 2015-01-11 DIAGNOSIS — R296 Repeated falls: Secondary | ICD-10-CM | POA: Diagnosis not present

## 2015-01-13 DIAGNOSIS — Z9181 History of falling: Secondary | ICD-10-CM | POA: Diagnosis not present

## 2015-01-13 DIAGNOSIS — R296 Repeated falls: Secondary | ICD-10-CM | POA: Diagnosis not present

## 2015-01-18 DIAGNOSIS — R296 Repeated falls: Secondary | ICD-10-CM | POA: Diagnosis not present

## 2015-01-18 DIAGNOSIS — Z9181 History of falling: Secondary | ICD-10-CM | POA: Diagnosis not present

## 2015-01-20 DIAGNOSIS — Z9181 History of falling: Secondary | ICD-10-CM | POA: Diagnosis not present

## 2015-01-20 DIAGNOSIS — R296 Repeated falls: Secondary | ICD-10-CM | POA: Diagnosis not present

## 2015-01-25 DIAGNOSIS — R296 Repeated falls: Secondary | ICD-10-CM | POA: Diagnosis not present

## 2015-01-25 DIAGNOSIS — Z9181 History of falling: Secondary | ICD-10-CM | POA: Diagnosis not present

## 2015-01-26 DIAGNOSIS — R1312 Dysphagia, oropharyngeal phase: Secondary | ICD-10-CM | POA: Diagnosis not present

## 2015-01-28 DIAGNOSIS — R1312 Dysphagia, oropharyngeal phase: Secondary | ICD-10-CM | POA: Diagnosis not present

## 2015-02-24 DIAGNOSIS — Z1389 Encounter for screening for other disorder: Secondary | ICD-10-CM | POA: Diagnosis not present

## 2015-02-24 DIAGNOSIS — D509 Iron deficiency anemia, unspecified: Secondary | ICD-10-CM | POA: Diagnosis not present

## 2015-02-24 DIAGNOSIS — G40909 Epilepsy, unspecified, not intractable, without status epilepticus: Secondary | ICD-10-CM | POA: Diagnosis not present

## 2015-02-24 DIAGNOSIS — Z0000 Encounter for general adult medical examination without abnormal findings: Secondary | ICD-10-CM | POA: Diagnosis not present

## 2015-02-24 DIAGNOSIS — E222 Syndrome of inappropriate secretion of antidiuretic hormone: Secondary | ICD-10-CM | POA: Diagnosis not present

## 2015-02-24 DIAGNOSIS — I10 Essential (primary) hypertension: Secondary | ICD-10-CM | POA: Diagnosis not present

## 2015-02-24 DIAGNOSIS — H6121 Impacted cerumen, right ear: Secondary | ICD-10-CM | POA: Diagnosis not present

## 2015-03-05 ENCOUNTER — Emergency Department (HOSPITAL_COMMUNITY): Payer: Medicare Other

## 2015-03-05 ENCOUNTER — Inpatient Hospital Stay (HOSPITAL_COMMUNITY)
Admission: EM | Admit: 2015-03-05 | Discharge: 2015-03-07 | DRG: 312 | Disposition: A | Discharge status: Home / Self Care | Payer: Medicare Other | Attending: Family Medicine | Admitting: Family Medicine

## 2015-03-05 ENCOUNTER — Encounter (HOSPITAL_COMMUNITY): Payer: Self-pay | Admitting: Neurology

## 2015-03-05 DIAGNOSIS — G20 Parkinson's disease: Secondary | ICD-10-CM | POA: Diagnosis not present

## 2015-03-05 DIAGNOSIS — I480 Paroxysmal atrial fibrillation: Secondary | ICD-10-CM | POA: Diagnosis not present

## 2015-03-05 DIAGNOSIS — K2970 Gastritis, unspecified, without bleeding: Secondary | ICD-10-CM | POA: Diagnosis not present

## 2015-03-05 DIAGNOSIS — I340 Nonrheumatic mitral (valve) insufficiency: Secondary | ICD-10-CM | POA: Diagnosis present

## 2015-03-05 DIAGNOSIS — F419 Anxiety disorder, unspecified: Secondary | ICD-10-CM | POA: Diagnosis present

## 2015-03-05 DIAGNOSIS — Z85820 Personal history of malignant melanoma of skin: Secondary | ICD-10-CM

## 2015-03-05 DIAGNOSIS — R001 Bradycardia, unspecified: Secondary | ICD-10-CM

## 2015-03-05 DIAGNOSIS — Z9841 Cataract extraction status, right eye: Secondary | ICD-10-CM

## 2015-03-05 DIAGNOSIS — Z7982 Long term (current) use of aspirin: Secondary | ICD-10-CM

## 2015-03-05 DIAGNOSIS — I472 Ventricular tachycardia: Secondary | ICD-10-CM | POA: Diagnosis not present

## 2015-03-05 DIAGNOSIS — R55 Syncope and collapse: Secondary | ICD-10-CM | POA: Diagnosis present

## 2015-03-05 DIAGNOSIS — F329 Major depressive disorder, single episode, unspecified: Secondary | ICD-10-CM | POA: Diagnosis present

## 2015-03-05 DIAGNOSIS — I10 Essential (primary) hypertension: Secondary | ICD-10-CM | POA: Diagnosis present

## 2015-03-05 DIAGNOSIS — K219 Gastro-esophageal reflux disease without esophagitis: Secondary | ICD-10-CM | POA: Diagnosis present

## 2015-03-05 DIAGNOSIS — Z66 Do not resuscitate: Secondary | ICD-10-CM | POA: Diagnosis present

## 2015-03-05 DIAGNOSIS — G40209 Localization-related (focal) (partial) symptomatic epilepsy and epileptic syndromes with complex partial seizures, not intractable, without status epilepticus: Secondary | ICD-10-CM | POA: Diagnosis present

## 2015-03-05 DIAGNOSIS — Z961 Presence of intraocular lens: Secondary | ICD-10-CM | POA: Diagnosis present

## 2015-03-05 DIAGNOSIS — Z888 Allergy status to other drugs, medicaments and biological substances status: Secondary | ICD-10-CM | POA: Diagnosis not present

## 2015-03-05 DIAGNOSIS — G40909 Epilepsy, unspecified, not intractable, without status epilepticus: Secondary | ICD-10-CM

## 2015-03-05 DIAGNOSIS — N400 Enlarged prostate without lower urinary tract symptoms: Secondary | ICD-10-CM | POA: Diagnosis present

## 2015-03-05 DIAGNOSIS — E871 Hypo-osmolality and hyponatremia: Secondary | ICD-10-CM | POA: Diagnosis present

## 2015-03-05 DIAGNOSIS — R112 Nausea with vomiting, unspecified: Secondary | ICD-10-CM | POA: Diagnosis not present

## 2015-03-05 DIAGNOSIS — R930 Abnormal findings on diagnostic imaging of skull and head, not elsewhere classified: Secondary | ICD-10-CM | POA: Diagnosis not present

## 2015-03-05 DIAGNOSIS — G250 Essential tremor: Secondary | ICD-10-CM | POA: Diagnosis present

## 2015-03-05 DIAGNOSIS — S299XXA Unspecified injury of thorax, initial encounter: Secondary | ICD-10-CM | POA: Diagnosis not present

## 2015-03-05 DIAGNOSIS — M1990 Unspecified osteoarthritis, unspecified site: Secondary | ICD-10-CM | POA: Diagnosis present

## 2015-03-05 DIAGNOSIS — Z9842 Cataract extraction status, left eye: Secondary | ICD-10-CM

## 2015-03-05 DIAGNOSIS — H9190 Unspecified hearing loss, unspecified ear: Secondary | ICD-10-CM

## 2015-03-05 DIAGNOSIS — R011 Cardiac murmur, unspecified: Secondary | ICD-10-CM

## 2015-03-05 LAB — CBC WITH DIFFERENTIAL/PLATELET
BASOS PCT: 0 % (ref 0–1)
Basophils Absolute: 0 10*3/uL (ref 0.0–0.1)
EOS PCT: 2 % (ref 0–5)
Eosinophils Absolute: 0.1 10*3/uL (ref 0.0–0.7)
HCT: 35.4 % — ABNORMAL LOW (ref 39.0–52.0)
Hemoglobin: 11.9 g/dL — ABNORMAL LOW (ref 13.0–17.0)
Lymphocytes Relative: 32 % (ref 12–46)
Lymphs Abs: 1.9 10*3/uL (ref 0.7–4.0)
MCH: 29.3 pg (ref 26.0–34.0)
MCHC: 33.6 g/dL (ref 30.0–36.0)
MCV: 87.2 fL (ref 78.0–100.0)
Monocytes Absolute: 0.7 10*3/uL (ref 0.1–1.0)
Monocytes Relative: 12 % (ref 3–12)
Neutro Abs: 3.2 10*3/uL (ref 1.7–7.7)
Neutrophils Relative %: 54 % (ref 43–77)
PLATELETS: 241 10*3/uL (ref 150–400)
RBC: 4.06 MIL/uL — ABNORMAL LOW (ref 4.22–5.81)
RDW: 14.8 % (ref 11.5–15.5)
WBC: 6 10*3/uL (ref 4.0–10.5)

## 2015-03-05 LAB — TROPONIN I
Troponin I: <0.03 ng/mL (ref <0.031)
Troponin I: <0.03 ng/mL (ref <0.031)

## 2015-03-05 LAB — TSH: TSH: 1.154 u[IU]/mL (ref 0.350–4.500)

## 2015-03-05 LAB — D-DIMER, QUANTITATIVE (NOT AT ARMC): D DIMER QUANT: 0.32 ug{FEU}/mL (ref 0.00–0.48)

## 2015-03-05 LAB — BASIC METABOLIC PANEL
Anion gap: 7 (ref 5–15)
BUN: 16 mg/dL (ref 6–20)
CHLORIDE: 101 mmol/L (ref 101–111)
CO2: 25 mmol/L (ref 22–32)
Calcium: 8.9 mg/dL (ref 8.9–10.3)
Creatinine, Ser: 1.09 mg/dL (ref 0.61–1.24)
GFR calc Af Amer: >60 mL/min (ref >60)
GFR calc non Af Amer: 58 mL/min — ABNORMAL LOW (ref >60)
Glucose, Bld: 116 mg/dL — ABNORMAL HIGH (ref 65–99)
Potassium: 4.4 mmol/L (ref 3.5–5.1)
Sodium: 133 mmol/L — ABNORMAL LOW (ref 135–145)

## 2015-03-05 MED ORDER — LABETALOL HCL 100 MG PO TABS
50.0000 mg | ORAL_TABLET | Freq: Two times a day (BID) | ORAL | Status: DC
Start: 2015-03-05 — End: 2015-03-07
  Administered 2015-03-05 – 2015-03-07 (×4): 50 mg via ORAL
  Filled 2015-03-05 (×4): qty 1

## 2015-03-05 MED ORDER — SODIUM CHLORIDE 0.9 % IJ SOLN
3.0000 mL | Freq: Two times a day (BID) | INTRAMUSCULAR | Status: DC
  Administered 2015-03-06: 3 mL via INTRAVENOUS

## 2015-03-05 MED ORDER — SENNA-DOCUSATE SODIUM 8.6-50 MG PO TABS: 1.0000 | ORAL_TABLET | Freq: Every evening | ORAL | Status: DC | PRN

## 2015-03-05 MED ORDER — BENAZEPRIL HCL 20 MG PO TABS
20.0000 mg | ORAL_TABLET | Freq: Every day | ORAL | Status: DC
  Administered 2015-03-06 – 2015-03-07 (×2): 20 mg via ORAL
  Filled 2015-03-05 (×2): qty 1

## 2015-03-05 MED ORDER — POLYSACCHARIDE IRON COMPLEX 150 MG PO CAPS
150.0000 mg | ORAL_CAPSULE | Freq: Two times a day (BID) | ORAL | Status: DC
  Administered 2015-03-05 – 2015-03-07 (×4): 150 mg via ORAL
  Filled 2015-03-05 (×4): qty 1

## 2015-03-05 MED ORDER — ONDANSETRON HCL 4 MG/2ML IJ SOLN: 4.0000 mg | Freq: Four times a day (QID) | INTRAMUSCULAR | Status: DC | PRN

## 2015-03-05 MED ORDER — POLYETHYLENE GLYCOL 3350 17 G PO PACK
17.0000 g | PACK | ORAL | Status: DC
  Administered 2015-03-05 – 2015-03-07 (×2): 17 g via ORAL
  Filled 2015-03-05 (×3): qty 1

## 2015-03-05 MED ORDER — SODIUM CHLORIDE 0.9 % IV SOLN
INTRAVENOUS | Status: DC
  Administered 2015-03-05 – 2015-03-06 (×2): via INTRAVENOUS

## 2015-03-05 MED ORDER — AMLODIPINE BESY-BENAZEPRIL HCL 5-20 MG PO CAPS: 1.0000 | ORAL_CAPSULE | Freq: Every day | ORAL | Status: DC

## 2015-03-05 MED ORDER — LACOSAMIDE 50 MG PO TABS
200.0000 mg | ORAL_TABLET | Freq: Every day | ORAL | Status: DC
  Administered 2015-03-05 – 2015-03-06 (×2): 200 mg via ORAL
  Filled 2015-03-05 (×2): qty 4

## 2015-03-05 MED ORDER — DOCUSATE SODIUM 100 MG PO CAPS
100.0000 mg | ORAL_CAPSULE | Freq: Two times a day (BID) | ORAL | Status: DC
  Administered 2015-03-05 – 2015-03-07 (×4): 100 mg via ORAL
  Filled 2015-03-05 (×4): qty 1

## 2015-03-05 MED ORDER — ACETAMINOPHEN 325 MG PO TABS
650.0000 mg | ORAL_TABLET | Freq: Four times a day (QID) | ORAL | Status: DC | PRN
  Administered 2015-03-06: 650 mg via ORAL
  Filled 2015-03-05: qty 2

## 2015-03-05 MED ORDER — ENOXAPARIN SODIUM 40 MG/0.4ML ~~LOC~~ SOLN
40.0000 mg | SUBCUTANEOUS | Status: DC
  Administered 2015-03-05 – 2015-03-06 (×2): 40 mg via SUBCUTANEOUS
  Filled 2015-03-05 (×2): qty 0.4

## 2015-03-05 MED ORDER — AMLODIPINE BESYLATE 5 MG PO TABS
5.0000 mg | ORAL_TABLET | Freq: Every day | ORAL | Status: DC
  Administered 2015-03-06 – 2015-03-07 (×2): 5 mg via ORAL
  Filled 2015-03-05 (×2): qty 1

## 2015-03-05 MED ORDER — ACETAMINOPHEN 650 MG RE SUPP: 650.0000 mg | Freq: Four times a day (QID) | RECTAL | Status: DC | PRN

## 2015-03-05 MED ORDER — LEVETIRACETAM 750 MG PO TABS
750.0000 mg | ORAL_TABLET | Freq: Two times a day (BID) | ORAL | Status: DC
  Administered 2015-03-05 – 2015-03-07 (×4): 750 mg via ORAL
  Filled 2015-03-05 (×4): qty 1

## 2015-03-05 MED ORDER — PANTOPRAZOLE SODIUM 40 MG PO TBEC
40.0000 mg | DELAYED_RELEASE_TABLET | Freq: Every day | ORAL | Status: DC
  Administered 2015-03-06 – 2015-03-07 (×2): 40 mg via ORAL
  Filled 2015-03-05 (×2): qty 1

## 2015-03-05 MED ORDER — ASPIRIN 81 MG PO CHEW
81.0000 mg | CHEWABLE_TABLET | Freq: Every evening | ORAL | Status: DC
Start: 2015-03-05 — End: 2015-03-07
  Administered 2015-03-05 – 2015-03-06 (×2): 81 mg via ORAL
  Filled 2015-03-05 (×2): qty 1

## 2015-03-05 MED ORDER — SENNOSIDES-DOCUSATE SODIUM 8.6-50 MG PO TABS: 1.0000 | ORAL_TABLET | Freq: Every evening | ORAL | Status: DC | PRN

## 2015-03-05 MED ORDER — LACOSAMIDE 50 MG PO TABS
50.0000 mg | ORAL_TABLET | Freq: Every morning | ORAL | Status: DC
Start: 2015-03-06 — End: 2015-03-07
  Administered 2015-03-06 – 2015-03-07 (×2): 50 mg via ORAL
  Filled 2015-03-05 (×2): qty 1

## 2015-03-05 MED ORDER — ONDANSETRON HCL 4 MG PO TABS: 4.0000 mg | ORAL_TABLET | Freq: Four times a day (QID) | ORAL | Status: DC | PRN

## 2015-03-05 NOTE — ED Provider Notes (Signed)
CSN: [id number]     Arrival date & time 03/05/15  1137 History   First MD Initiated Contact with Patient 03/05/15 1142     Chief Complaint  Patient presents with  . Near Syncope     (Consider location/radiation/quality/duration/timing/severity/associated sxs/prior Treatment) HPI  81-year-old male presents from his living facility after he passed out today. He was sitting outside getting ready to do some exercises when the instructor came up to him and noticed that his chin was in his chest. He was not responsive salicylate and down to the ground. Patient was noted to have a blood pressure of 80 systolic when fire department first checked, was normal on repeat check by EMS. They have noted him to be intermittently bradycardic, lowest heart rate was 40. Patient is extremely hard of hearing and his hearing aids are taken out. He is able to answer some questions but the questioning is limited. Patient is noted to have bruises and states he fell last night but cannot tell me how or what he injured.  Past Medical History  Diagnosis Date  . Hypertension   . Seizures   . A-fib   . Dehydration   . Deafness   . Anxiety   . Reflux   . Arthritis   . Depression   . Atrial fibrillation   . Tremor, essential   . Seizure   . BPH (benign prostatic hyperplasia)   . Syncope   . Deaf   . Melanoma   . GERD (gastroesophageal reflux disease)   . Mitral regurgitation   . Abnormality of gait    Past Surgical History  Procedure Laterality Date  . Unknown    . Appendectomy    . Cataract extraction w/ intraocular lens  implant, bilateral    . Esophagogastroduodenoscopy (egd) with propofol N/A 09/15/2014    Procedure: ESOPHAGOGASTRODUODENOSCOPY (EGD) WITH PROPOFOL;  Surgeon: [name], MD;  Location: [facility] ENDOSCOPY;  Service: Endoscopy;  Laterality: N/A;   Family History  Problem Relation Age of Onset  . Cancer Mother     Skin cancer  . Stroke Father   . Parkinsonism Sister   . Diabetes Brother      History  Substance Use Topics  . Smoking status: Never Smoker   . Smokeless tobacco: Not on file  . Alcohol Use: No    Review of Systems  Unable to perform ROS: Other  Cardiovascular: Negative for chest pain.  Neurological: Positive for syncope.      Allergies  Topamax; Uroxatral; and Valium  Home Medications   Prior to Admission medications   Medication Sig Start Date End Date Taking? Authorizing Provider  acetaminophen (TYLENOL) 500 MG tablet Take 500 mg by mouth every 6 (six) hours as needed for pain. Not to exceed 3 gms of acetaminophen in 24 hours    Historical Provider, MD  amLODipine-benazepril (LOTREL) 5-20 MG per capsule Take 1 capsule by mouth daily.    Historical Provider, MD  aspirin 81 MG chewable tablet Chew 1 tablet (81 mg total) by mouth every evening. 09/22/14   Thurnell Lose, MD  cephALEXin (KEFLEX) 500 MG capsule Take 1 capsule (500 mg total) by mouth 4 (four) times daily. Patient not taking: Reported on 11/12/2014 09/17/14   Thurnell Lose, MD  HYDROcodone-acetaminophen (NORCO/VICODIN) 5-325 MG per tablet Take 1 tablet by mouth every 8 (eight) hours as needed for moderate pain. 09/17/14   Thurnell Lose, MD  iron polysaccharides (NIFEREX) 150 MG capsule Take 150 mg by mouth  daily.    Historical Provider, MD  labetalol (NORMODYNE) 100 MG tablet Take 100 mg by mouth 2 (two) times daily.     Historical Provider, MD  lacosamide (VIMPAT) 50 MG TABS tablet Take 1 tablet in the AM (50 mg) and 4 tablets (200 mg ) in the PM. Patient taking differently: Take 50-200 mg by mouth 2 (two) times daily. Take 1 tablet in the AM (50 mg) and 4 tablets (200 mg ) in the PM. 10/14/14   Ward Givens, NP  levETIRAcetam (KEPPRA) 750 MG tablet Take 750 mg by mouth 2 (two) times daily.  07/12/14   Historical Provider, MD  ondansetron (ZOFRAN) 4 MG tablet Take 1 tablet (4 mg total) by mouth every 8 (eight) hours as needed for nausea or vomiting. 09/17/14   Thurnell Lose, MD   pantoprazole (PROTONIX) 40 MG tablet Take 1 tablet (40 mg total) by mouth 2 (two) times daily. Patient taking differently: Take 40 mg by mouth daily.  09/17/14   Thurnell Lose, MD  polyethylene glycol (MIRALAX / GLYCOLAX) packet Take 17 g by mouth every other day.     Historical Provider, MD  sennosides-docusate sodium (SENOKOT-S) 8.6-50 MG tablet Take 1 tablet by mouth at bedtime as needed for constipation.    Historical Provider, MD  vitamin B-12 (CYANOCOBALAMIN) 1000 MCG tablet Take 2,000 mcg by mouth daily.    Historical Provider, MD   There were no vitals taken for this visit. Physical Exam  Constitutional: He is oriented to person, place, and time. He appears well-developed and well-nourished.  Very hard of hearing  HENT:  Head: Normocephalic and atraumatic.  Right Ear: External ear normal.  Left Ear: External ear normal.  Nose: Nose normal.  Eyes: Right eye exhibits no discharge. Left eye exhibits no discharge.  Neck: Neck supple.  Cardiovascular: Regular rhythm, normal heart sounds and intact distal pulses.  Bradycardia present.   Pulses:      Radial pulses are 2+ on the right side, and 2+ on the left side.  Pulmonary/Chest: Effort normal and breath sounds normal.  Abdominal: Soft. He exhibits no distension. There is no tenderness.    Musculoskeletal: He exhibits no edema.       Left upper arm: He exhibits no tenderness, no bony tenderness, no swelling, no edema and no deformity.       Arms: Neurological: He is alert and oriented to person, place, and time.  Skin: Skin is warm and dry. He is not diaphoretic.  Nursing note and vitals reviewed.   ED Course  Procedures (including critical care time) Labs Review Labs Reviewed  BASIC METABOLIC PANEL - Abnormal; Notable for the following:    Sodium 133 (*)    Glucose, Bld 116 (*)    GFR calc non Af Amer 58 (*)    All other components within normal limits  CBC WITH DIFFERENTIAL/PLATELET - Abnormal; Notable for the  following:    RBC 4.06 (*)    Hemoglobin 11.9 (*)    HCT 35.4 (*)    All other components within normal limits  TROPONIN I  D-DIMER, QUANTITATIVE (NOT AT [name])  TSH  TROPONIN I  TROPONIN I  LEVETIRACETAM LEVEL    Imaging Review Ct Head Wo Contrast  03/05/2015   CLINICAL DATA:  Generalized weakness and nauseaHx of Melanoma  EXAM: CT HEAD WITHOUT CONTRAST  TECHNIQUE: Contiguous axial images were obtained from the base of the skull through the vertex without intravenous contrast.  COMPARISON:  01/01/2015  FINDINGS: Atherosclerotic and physiologic intracranial calcifications. Diffuse parenchymal atrophy. Patchy areas of hypoattenuation in deep and periventricular white matter bilaterally. Negative for acute intracranial hemorrhage, mass lesion, acute infarction, midline shift, or mass-effect. Acute infarct may be inapparent on noncontrast CT. Ventricles and sulci symmetric. Bone windows demonstrate no focal lesion.  IMPRESSION: 1. Negative for bleed or other acute intracranial process. 2. Atrophy and nonspecific white matter changes.   Electronically Signed   By: Lucrezia Europe M.D.   On: 03/05/2015 12:56   Dg Chest Port 1 View  03/05/2015   CLINICAL DATA:  Syncope and fall today.  EXAM: PORTABLE CHEST - 1 VIEW  COMPARISON:  01/01/2015  FINDINGS: Bibasilar opacities are stable. Upper normal heart size. No pneumothorax.  IMPRESSION: Stable bibasilar pulmonary opacities.   Electronically Signed   By: Marybelle Killings M.D.   On: 03/05/2015 12:42     EKG Interpretation   Date/Time:  Saturday March 05 2015 11:51:32 EDT Ventricular Rate:  51 PR Interval:  193 QRS Duration: 92 QT Interval:  457 QTC Calculation: 421 R Axis:   117 Text Interpretation:  Sinus  bradycardia Right axis deviation Minimal ST  elevation, anterior leads No significant change since Mar 2016 Confirmed  by Zenya Hickam  MD, Henreitta Spittler ([id number]) on 03/05/2015 12:10:23 PM      MDM   Final diagnoses:  Syncope and collapse    Patient is  stable and at his baseline currently. Mildly bradycardic, has had this before. Given HR was in 18s for EMS and transient hypotension, he will benefit from observation admission with telemetry. Patient is DNR but family would consider pacemaker if needed, and thus will admit to medicine.    Sherwood Gambler, MD 03/05/15 [phone]

## 2015-03-05 NOTE — H&P (Signed)
[facility] History and Physical                                                                                    Barry Dean, is a 81 y.o. male  MRN: [id number]   DOB - Apr 30, 1924  Admit Date - 03/05/2015  Outpatient Primary MD for the patient is [name], MD  Referring MD: Odis Luster  With History of -  Past Medical History  Diagnosis Date  . Hypertension   . Seizures   . A-fib   . Dehydration   . Deafness   . Anxiety   . Reflux   . Arthritis   . Depression   . Atrial fibrillation   . Tremor, essential   . Seizure   . BPH (benign prostatic hyperplasia)   . Syncope   . Deaf   . Melanoma   . GERD (gastroesophageal reflux disease)   . Mitral regurgitation   . Abnormality of gait       Past Surgical History  Procedure Laterality Date  . Unknown    . Appendectomy    . Cataract extraction w/ intraocular lens  implant, bilateral    . Esophagogastroduodenoscopy (egd) with propofol N/A 09/15/2014    Procedure: ESOPHAGOGASTRODUODENOSCOPY (EGD) WITH PROPOFOL;  Surgeon: [name], MD;  Location: [facility];  Service: Endoscopy;  Laterality: N/A;    in for   Chief Complaint  Patient presents with  . Near Syncope     HPI This is an 81 year old male patient with history of underlying Parkinson's disease, seizure disorder, paroxysmal atrial fibrillation currently maintaining sinus rhythm, prior hospitalizations for gastroenteritis, dehydration, aspiration pneumonitis, he is also legally deaf, and has BPH. He is a resident of [facility] living. He was sent to the ER after he had a syncopal episode with collapse while seated. According to the senior living center the patient just got outside and was sitting on a bench with activities coronary noticed he was not responding. He was transitioned from the bench to the ground and placed in supine position. When the fire department arrived the patient's blood pressure was 80/60 and he was responsive to  painful stimuli only. Duration of this limited responsive period was about 5 minutes. When EMS arrived the patient was alert and oriented and at this point his blood pressure was 136/68. His CBG was 116 and his heart rate was averaging between 40-50 bpm in sinus. Patient was complaining of generalized weakness and nausea. No acute focal neurological deficits concerning for stroke.  In the ER patient was afebrile, he was in sinus bradycardia with the heart rate of 51, his blood pressure was 142/60. Orthostatics were checked and his supine blood pressure was 121/59 with only a 6 mmHg or drop in systolic pressure to 378 with standing. Pulse did respond appropriately and went from 69-78 bpm with standing. Patient remained alert and had no focal injuries on initial exam by the EDP. CT of the head was negative for bleed or other acute intracranial process. Return data unremarkable except for sodium of 133 with patient having prior history of hyponatremia in the past. Troponin was normal, hemoglobin stable at 11.9,  glucose 116. Chest X-ray revealed stable bibasilar opacities without any acute process. EKG revealed sinus bradycardia with a rate of 51 bpm and QTC 421 ms with subtle ST elevation in anterior lateral leads when compared to previous EKG from March 2016. In discussing with the patient's daughter at the bedside she reports that yesterday evening patient had a similar episode at the facility where he was found at the foot of his bed on the floor but with that episode he was alert and had no loss of consciousness. Of note patient is currently on oxygen and unclear if patient was hypoxemic prior to application of oxygen. Other history obtained from the daughters include that the patient has had a history of losing weight recently unknown amount, has poor oral intake and she suspects he doesn't drink enough fluids at the facility. She is also noted his gait becoming more slow and unsteady over the past several  months.   Review of Systems   In addition to the HPI above,  No Fever-chills, myalgias or other constitutional symptoms No Headache, changes with Vision or hearing, new weakness, tingling, numbness in any extremity boarded by the patient No problems swallowing food or Liquids, indigestion/reflux No Chest pain, Cough or Shortness of Breath, or awareness of palpitations, orthopnea or DOE No Abdominal pain, N/V; no melena or hematochezia, no dark tarry stools, Bowel movements are regular, No dysuria, hematuria or flank pain No new skin rashes, lesions, masses or bruises, No new joints pains-aches No recent weight gain or loss No polyuria, polydypsia or polyphagia,  *A full 10 point Review of Systems was done, except as stated above, all other Review of Systems were negative.  Social History History  Substance Use Topics  . Smoking status: Never Smoker   . Smokeless tobacco: Not on file  . Alcohol Use: No    Resides at: [facility] living facility  Lives with: N/A  Ambulatory status: With a walker   Family History Family History  Problem Relation Age of Onset  . Cancer Mother     Skin cancer  . Stroke Father   . Parkinsonism Sister   . Diabetes Brother      Prior to Admission medications   Medication Sig Start Date End Date Taking? Authorizing Provider  amLODipine-benazepril (LOTREL) 5-20 MG per capsule Take 1 capsule by mouth daily.   Yes Historical Provider, MD  aspirin 81 MG chewable tablet Chew 1 tablet (81 mg total) by mouth every evening. 09/22/14  Yes Thurnell Lose, MD  HYDROcodone-acetaminophen (NORCO/VICODIN) 5-325 MG per tablet Take 1 tablet by mouth every 8 (eight) hours as needed for moderate pain. 09/17/14  Yes Thurnell Lose, MD  iron polysaccharides (NIFEREX) 150 MG capsule Take 150 mg by mouth 2 (two) times daily.    Yes Historical Provider, MD  labetalol (NORMODYNE) 100 MG tablet Take 100 mg by mouth 2 (two) times daily.    Yes Historical Provider,  MD  lacosamide (VIMPAT) 50 MG TABS tablet Take 1 tablet in the AM (50 mg) and 4 tablets (200 mg ) in the PM. Patient taking differently: Take 50-200 mg by mouth 2 (two) times daily. Take 1 tablet in the AM (50 mg) and 4 tablets (200 mg ) in the PM. 10/14/14  Yes Ward Givens, NP  levETIRAcetam (KEPPRA) 750 MG tablet Take 750 mg by mouth 2 (two) times daily.  07/12/14  Yes Historical Provider, MD  ondansetron (ZOFRAN) 4 MG tablet Take 1 tablet (4 mg total) by mouth  every 8 (eight) hours as needed for nausea or vomiting. 09/17/14  Yes Thurnell Lose, MD  pantoprazole (PROTONIX) 40 MG tablet Take 1 tablet (40 mg total) by mouth 2 (two) times daily. Patient taking differently: Take 40 mg by mouth daily.  09/17/14  Yes Thurnell Lose, MD  vitamin B-12 (CYANOCOBALAMIN) 1000 MCG tablet Take 2,000 mcg by mouth daily.   Yes Historical Provider, MD  acetaminophen (TYLENOL) 500 MG tablet Take 500 mg by mouth every 6 (six) hours as needed for pain. Not to exceed 3 gms of acetaminophen in 24 hours    Historical Provider, MD  cephALEXin (KEFLEX) 500 MG capsule Take 1 capsule (500 mg total) by mouth 4 (four) times daily. Patient not taking: Reported on 11/12/2014 09/17/14   Thurnell Lose, MD  polyethylene glycol [facility] Floria Raveling) packet Take 17 g by mouth every other day.     Historical Provider, MD  sennosides-docusate sodium (SENOKOT-S) 8.6-50 MG tablet Take 1 tablet by mouth at bedtime as needed for constipation.    Historical Provider, MD    Allergies  Allergen Reactions  . Topamax Other (See Comments)    Unknown reaction  . Uroxatral [Alfuzosin Hydrochloride] Other (See Comments)    Unknown reaction  . Valium Other (See Comments)    Unknown reaction    Physical Exam  Vitals  Blood pressure 123/59, pulse 62, temperature 98.2 F (36.8 C), temperature source Oral, resp. rate 20, SpO2 95 %.   General:  In no acute distress, appears chronically ill and stated age  Psych:  Normal affect,  exam limited by patient's hearing loss  Neuro:   No focal neurological deficits, CN II through XII intact, Strength 5/5 all 4 extremities, Sensation intact all 4 extremities. Was able to follow commands through mimicking easily  ENT:  Ears and Eyes appear Normal, Conjunctivae clear, PER. Very dry oral mucosa without erythema or exudates.  Neck:  Supple, No lymphadenopathy appreciated  Respiratory:  Symmetrical chest wall movement, Good air movement bilaterally, CTAB. 2 L oxygen sats 95-100%  Cardiac:  RRR, No Murmurs, no LE edema noted, no JVD, No carotid bruits, peripheral pulses palpable at 2+  Abdomen:  Positive bowel sounds, Soft, Non tender, Non distended,  No masses appreciated, no obvious hepatosplenomegaly  Skin:  No Cyanosis, Normal Skin Turgor, No Skin Rash or Bruise. Have small abrasion left anterior knee sustained last night with fall  Extremities: Symmetrical without obvious trauma or injury,  no effusions.  Data Review  CBC  Recent Labs Lab 03/05/15 1200  WBC 6.0  HGB 11.9*  HCT 35.4*  PLT 241  MCV 87.2  MCH 29.3  MCHC 33.6  RDW 14.8  LYMPHSABS 1.9  MONOABS 0.7  EOSABS 0.1  BASOSABS 0.0    Chemistries   Recent Labs Lab 03/05/15 1200  NA 133*  K 4.4  CL 101  CO2 25  GLUCOSE 116*  BUN 16  CREATININE 1.09  CALCIUM 8.9    CrCl cannot be calculated (Unknown ideal weight.).  No results for input(s): TSH, T4TOTAL, T3FREE, THYROIDAB in the last 72 hours.  Invalid input(s): FREET3  Coagulation profile No results for input(s): INR, PROTIME in the last 168 hours.  No results for input(s): DDIMER in the last 72 hours.  Cardiac Enzymes  Recent Labs Lab 03/05/15 1200  TROPONINI <0.03    Invalid input(s): POCBNP  Urinalysis    Component Value Date/Time   COLORURINE YELLOW 11/12/2014 [place] 11/12/2014 1132  LABSPEC 1.010 11/12/2014 1132   PHURINE 7.0 11/12/2014 1132   GLUCOSEU NEGATIVE 11/12/2014 1132   HGBUR  NEGATIVE 11/12/2014 1132   BILIRUBINUR NEGATIVE 11/12/2014 1132   KETONESUR NEGATIVE 11/12/2014 1132   PROTEINUR NEGATIVE 11/12/2014 1132   UROBILINOGEN 1.0 11/12/2014 1132   NITRITE NEGATIVE 11/12/2014 1132   LEUKOCYTESUR NEGATIVE 11/12/2014 1132    Imaging results:   Ct Head Wo Contrast  03/05/2015   CLINICAL DATA:  Generalized weakness and nauseaHx of Melanoma  EXAM: CT HEAD WITHOUT CONTRAST  TECHNIQUE: Contiguous axial images were obtained from the base of the skull through the vertex without intravenous contrast.  COMPARISON:  01/01/2015  FINDINGS: Atherosclerotic and physiologic intracranial calcifications. Diffuse parenchymal atrophy. Patchy areas of hypoattenuation in deep and periventricular white matter bilaterally. Negative for acute intracranial hemorrhage, mass lesion, acute infarction, midline shift, or mass-effect. Acute infarct may be inapparent on noncontrast CT. Ventricles and sulci symmetric. Bone windows demonstrate no focal lesion.  IMPRESSION: 1. Negative for bleed or other acute intracranial process. 2. Atrophy and nonspecific white matter changes.   Electronically Signed   By: Lucrezia Europe M.D.   On: 03/05/2015 12:56   Dg Chest Port 1 View  03/05/2015   CLINICAL DATA:  Syncope and fall today.  EXAM: PORTABLE CHEST - 1 VIEW  COMPARISON:  01/01/2015  FINDINGS: Bibasilar opacities are stable. Upper normal heart size. No pneumothorax.  IMPRESSION: Stable bibasilar pulmonary opacities.   Electronically Signed   By: Marybelle Killings M.D.   On: 03/05/2015 12:42     EKG: (Independently reviewed) sinus bradycardia with normal QTC and subtle ST elevation in anterior lateral leads which is different from previous EKG March 2016   Assessment & Plan  Principal Problem:   Syncope and collapse -Admit to telemetry -Etiology unclear but differential includes: Orthostatic hypotension from volume depletion noting not orthostatic in the ER vs autonomic dysfunction in setting of Parkinson's  disease vs pulmonary embolism (ck d-dimer) vs symptomatic bradycardia without heart block vs seizure (ck EEG) -Check echocardiogram and continue telemetry monitoring for arrhythmia -PT evaluation especially with known gait disturbance in setting of Parkinson's and previous fall without altered mentation less than 24 hours prior to this admission  Active Problems:   HTN  -Continue preadmission Lotrel and labetalol since blood pressure has recovered -With bradycardia have decreased labetalol dosage (see below)    PAF history/Bradycardia -Currently maintaining sinus etiology to rhythm with bradycardic rates; EMS reported rates down into the 40s at the facility while patient was hypotensive but have not seen similar slow rates since arrival to ER nor have we seen any heart block -Because of presenting symptoms of bradycardia have decreased beta blocker by 50% but am reluctant to discontinue to avoid rebound tachycardia -Patient with subtle ST segment elevation but no other apparent ischemic symptoms other than the syncopal episode so we'll cycle cardiac enzymes and ck echo as noted above    Seizure disorder -Continue Vimpat and Keppra -Check levetiracetam level -Chek EEG    ?? Respiratory failure -Patient was placed on oxygen prior to arrival -Syncope and hypotension with bradycardia potentially could represent PE so check d-dimer    Parkinson's disease -Daughter reports progressive slowing of gait and gait disturbance/imbalance issues -PT evaluation    Hyponatremia -Documented history of an past -IV fluids given presentation    Deafness -Difficult communicating but is able to express needs and follow commands especially with mimicking -Can also ride out responses on word board    Mild mitral regurgitation -  Follow up on echocardiogram this admission    DVT Prophylaxis: Lovenox  Family Communication:   Daughter at bedside  Code Status:  DO NOT RESUSCITATE  Condition:   Stable  Discharge disposition: Anticipate discharge back to senior living facility pending workup of syncopal episode  Time spent in minutes : 60      Tiffannie Sloss L. ANP on 03/05/2015 at 3:08 PM  Between 7am to 7pm - Pager - [phone]  After 7pm go to [email or web address] - password [email or web address]  And look for the night coverage person covering me after hours  [facility]

## 2015-03-05 NOTE — ED Notes (Signed)
Per EMS-  Pt comes from [facility], had just gone outside and was sitting on bench when activities coordinator noticed he wasn't responding. Pt was laid to the ground, when fire dept arrived initial BP 80/60 responsive to painful stimuli. This episode lasted about 5 mins. When EMS arrived, he as alert and oriented. BP 136/68, CBG 116, HR 40-50. C/o generalized weakness and nausea. Pt is hard of hearing. Negative stroke screen.

## 2015-03-06 ENCOUNTER — Inpatient Hospital Stay (HOSPITAL_COMMUNITY): Payer: Medicare Other

## 2015-03-06 DIAGNOSIS — R011 Cardiac murmur, unspecified: Secondary | ICD-10-CM

## 2015-03-06 DIAGNOSIS — R55 Syncope and collapse: Secondary | ICD-10-CM

## 2015-03-06 LAB — TROPONIN I
Troponin I: <0.03 ng/mL (ref <0.031)
Troponin I: <0.03 ng/mL (ref <0.031)

## 2015-03-06 LAB — GLUCOSE, CAPILLARY: Glucose-Capillary: 78 mg/dL (ref 65–99)

## 2015-03-06 NOTE — Progress Notes (Signed)
Pt was desatting to mid to upper 80s while sleeping. Placed on 2L PRN which helped his sats

## 2015-03-06 NOTE — Progress Notes (Signed)
Barry Dean STM:[id number] DOB: 04-26-1924 DOA: 03/05/2015 PCP: Barry Argyle, MD  Brief narrative: 81 y/o ? S likely complex partial seizure history + gait disorder followed by [facility] Dean, [facility] resident, prior falls 12/2011, with at least 4 or 5 admissions for syncopal episodes over the past 3-4 years, upper GI bleed status post EGD 08/2014 = hiatal hernia gastritis, ongoing subclinical aspiration, mitral regurgitation, paroxysmal A. Fib-Chad2Vasc2~4 Not on AC-on ASA-secondary to risk of GI bleed and high fall risk for note 11/10/12, chronic hyponatremia admitted form [facility] with 2 accidental falls -LOC -Head Trauma -cp -sob -unilat weakness Unwitnessed but per report took 10 min for him to re-orient-wasn't confused Blood pressure 80/60 on arrival EMS   Past medical history-As per Problem list Chart reviewed as below-   Consultants:  none  Procedures:  ECHO  Antibiotics:  none   Subjective   Well Very HOH but oreinted to time/place/person No cp Eating fair Tells me that she   Objective    Interim History: none  Telemetry:  NSR-no arrythmia   Objective: Filed Vitals:   03/05/15 2335 03/06/15 0155 03/06/15 0424 03/06/15 0734  BP: 150/71  175/75 168/77  Pulse: 71 78 62 60  Temp:   98.3 F (36.8 C)   TempSrc:   Oral   Resp: 17 19 23 18   Height:      Weight:      SpO2: 89% 97% 94% 95%    Intake/Output Summary (Last 24 hours) at 03/06/15 0950 Last data filed at 03/05/15 1800  Gross per 24 hour  Intake    240 ml  Output      0 ml  Net    240 ml    Exam:  General: eomi, HOH, pleasant no pallor, no ict Cardiovascular: grd 4/6 M with openining snap-Bst heard L 5ICS, no  jvd no bruit Respiratory: clear no added sound Abdomen: soft nt nd  Skin no le edema Neuro-Power 5/5 Pill rolling notes.  No bradykinesia Gait not assessed  Data Reviewed: Basic Metabolic Panel:  Recent Labs Lab 03/05/15 1200  NA 133*  K 4.4    CL 101  CO2 25  GLUCOSE 116*  BUN 16  CREATININE 1.09  CALCIUM 8.9   Liver Function Tests: No results for input(s): AST, ALT, ALKPHOS, BILITOT, PROT, ALBUMIN in the last 168 hours. No results for input(s): LIPASE, AMYLASE in the last 168 hours. No results for input(s): AMMONIA in the last 168 hours. CBC:  Recent Labs Lab 03/05/15 1200  WBC 6.0  NEUTROABS 3.2  HGB 11.9*  HCT 35.4*  MCV 87.2  PLT 241   Cardiac Enzymes:  Recent Labs Lab 03/05/15 1200 03/05/15 1820 03/06/15 0041 03/06/15 0724  TROPONINI <0.03 <0.03 <0.03 <0.03   BNP: Invalid input(s): POCBNP CBG:  Recent Labs Lab 03/06/15 0727  GLUCAP 78    No results found for this or any previous visit (from the past 240 hour(s)).   Studies:              All Imaging reviewed and is as per above notation   Scheduled Meds: . amLODipine  5 mg Oral Daily  . aspirin  81 mg Oral QPM  . benazepril  20 mg Oral Daily  . docusate sodium  100 mg Oral BID  . enoxaparin (LOVENOX) injection  40 mg Subcutaneous Q24H  . iron polysaccharides  150 mg Oral BID  . labetalol  50 mg Oral BID  . lacosamide  200 mg  Oral QHS  . lacosamide  50 mg Oral q morning - 10a  . levETIRAcetam  750 mg Oral BID  . pantoprazole  40 mg Oral Daily  . polyethylene glycol  17 g Oral QODAY  . sodium chloride  3 mL Intravenous Q12H   Continuous Infusions: . sodium chloride 75 mL/hr at 03/06/15 0403     Assessment/Plan:  Principal Problem:   Syncope and collapse Etiology ? 2/2 to Murmur vs Sz[complex partial] or Transient HB--He has no formal diagnosis of parkinson's nor is he on any meds to suggest this is being Rx -Keep on tele -Minimize AV nodal agents-Labetalol already 1/2'd from 100-->50 bid.  Consider d/c Amlodipine + ACE -Euvolemic so d/c IV saline 50cc/hr 7/10 -Up with therapy when able -Vimpat has preponderance for arrhythmias and I discussed patient's case with neurology Barry Dean on 7/10 who does not feel without  arrhythmias at there is any other issue going on   Active Problems:   Seizure disorder-continue Keppra 750, Lacosamide 50 am, 200 qpm    HTN (hypertension)-see above discussion-Consider centrally acting agent [hydralazine if BP trends above 140/80   Hyponatremia-mild.   PAF (paroxysmal atrial fibrillation), Chad2Vasc2=3- Caution use with Labetalol Poor candidate for falls -Monitored bed -check am Mag   Undiagnosed cardiac murmurs -Echocardiogram performed 7/10 shows grade 2 diastolic dysfunction with pseudo-normal filling pattern and valve mobility restriction in the aortic valve without stenosis -Clinical exam does not concur with echocardiogram findings as patient has an opening snap? -I will ask Barry Dean prior cardiologist to review the patient closely as an outpatient   Bradycardia   Deafness  Physical therapy to assess the patient for appropriateness Patient has had 2 falls recently so there may be some element of dysautonomia. I do not see any evidence of parkinsonism although he has a tremor and this will need to be sorted out by his primary neurologist Barry Dean as an outpatient   Code Status: Full Family Communication: Discussed with daughter in detail at bedside Disposition Plan: Likely discharge home in the next 24-48 hours depending on telemetry trends and recurrence of symptoms to [place], MD  [facility] Pager [phone] 03/06/2015, 9:50 AM    LOS: 1 day

## 2015-03-06 NOTE — Plan of Care (Signed)
Problem: Phase I Progression Outcomes Goal: Voiding-avoid urinary catheter unless indicated Outcome: Not Met (add Reason) Incontinent with briefs

## 2015-03-06 NOTE — Evaluation (Signed)
Physical Therapy Evaluation Patient Details Name: Barry Dean MRN: [id number] DOB: 09/22/1923 Today's Date: 03/06/2015   History of Present Illness  Pt is an 81 y.o. male with parkinson's adm due to syncopal episode. Pt with seizure dx.   Clinical Impression  Pt adm due to above. Pt presents with deficits indicated below (see PT problem list). Pt to benefit from skilled acute PT to address deficits and maximize functional mobility. Pt from [facility]; will need SNF/24/7 (A) upon D/C due to deconditioning and balance deficits.     Follow Up Recommendations SNF;Supervision/Assistance - 24 hour    Equipment Recommendations  None recommended by PT    Recommendations for Other Services       Precautions / Restrictions Precautions Precautions: Fall Precaution Comments: seizures Restrictions Weight Bearing Restrictions: No      Mobility  Bed Mobility Overal bed mobility: Modified Independent             General bed mobility comments: HOB elevated and use of handrails  Transfers Overall transfer level: Needs assistance Equipment used: Rolling walker (2 wheeled) Transfers: Sit to/from Stand Sit to Stand: Min guard         General transfer comment: min guard to steady  Ambulation/Gait Ambulation/Gait assistance: Min assist Ambulation Distance (Feet): 20 Feet Assistive device: Rolling walker (2 wheeled) Gait Pattern/deviations: Step-through pattern;Decreased stride length;Shuffle Gait velocity: incr cadence   General Gait Details: unsteady; parkinsonian gait; difficulty managing RW   Stairs            Wheelchair Mobility    Modified Rankin (Stroke Patients Only)       Balance Overall balance assessment: History of Falls;Needs assistance Sitting-balance support: Feet supported;No upper extremity supported Sitting balance-Leahy Scale: Fair     Standing balance support: During functional activity;Bilateral upper extremity supported Standing  balance-Leahy Scale: Poor                               Pertinent Vitals/Pain Pain Assessment: No/denies pain    Home Living Family/patient expects to be discharged to:: Skilled nursing facility                 Additional Comments: pt from [facility]    Prior Function Level of Independence: Needs assistance   Gait / Transfers Assistance Needed: uses RW  ADL's / Homemaking Assistance Needed: staff assists with bathing and dressing and performs all housework. Patient able to perform grooming task of putting dentures in/out  Comments: daughter present during session     Hand Dominance   Dominant Hand: Left    Extremity/Trunk Assessment   Upper Extremity Assessment: Defer to OT evaluation           Lower Extremity Assessment: Generalized weakness      Cervical / Trunk Assessment: Kyphotic  Communication   Communication: HOH  Cognition Arousal/Alertness: Awake/alert Behavior During Therapy: Flat affect Overall Cognitive Status: Impaired/Different from baseline Area of Impairment: Attention;Problem solving;Following commands;Safety/judgement   Current Attention Level: Selective Memory: Decreased short-term memory Following Commands: Follows one step commands with increased time Safety/Judgement: Decreased awareness of deficits;Decreased awareness of safety   Problem Solving: Slow processing;Difficulty sequencing;Requires verbal cues;Requires tactile cues;Decreased initiation      General Comments General comments (skin integrity, edema, etc.): denied any dizziness    Exercises        Assessment/Plan    PT Assessment Patient needs continued PT services  PT Diagnosis Abnormality of gait;Difficulty walking;Generalized  weakness   PT Problem List Decreased strength;Decreased activity tolerance;Decreased balance;Decreased mobility;Decreased cognition;Decreased knowledge of use of DME;Decreased safety awareness  PT Treatment  Interventions DME instruction;Gait training;Functional mobility training;Therapeutic activities;Therapeutic exercise;Balance training;Neuromuscular re-education;Patient/family education   PT Goals (Current goals can be found in the Care Plan section) Acute Rehab PT Goals Patient Stated Goal: to go back to [facility] PT Goal Formulation: With patient/family Time For Goal Achievement: 03/20/15 Potential to Achieve Goals: Good    Frequency Min 3X/week   Barriers to discharge        Co-evaluation               End of Session Equipment Utilized During Treatment: Gait belt Activity Tolerance: Patient limited by fatigue Patient left: in bed;with call bell/phone within reach;with bed alarm set;with family/visitor present Nurse Communication: Mobility status;Precautions         Time: 7654-6503 PT Time Calculation (min) (ACUTE ONLY): 16 min   Charges:   PT Evaluation $Initial PT Evaluation Tier I: 1 Procedure     PT G Codes:        Dilraj Killgore, [place] N PT  03/06/2015, 1:49 PM

## 2015-03-06 NOTE — Progress Notes (Signed)
Pt weaned down from 2l of 02, to 1L of 02. Pt satting fine. Pt now on RA, will cont to monitor. Etta Quill, RN

## 2015-03-07 ENCOUNTER — Inpatient Hospital Stay (HOSPITAL_COMMUNITY): Payer: Medicare Other

## 2015-03-07 ENCOUNTER — Other Ambulatory Visit: Payer: Self-pay | Admitting: Physician Assistant

## 2015-03-07 DIAGNOSIS — R55 Syncope and collapse: Secondary | ICD-10-CM

## 2015-03-07 LAB — CBC
HCT: 35.3 % — ABNORMAL LOW (ref 39.0–52.0)
Hemoglobin: 12.1 g/dL — ABNORMAL LOW (ref 13.0–17.0)
MCH: 29.9 pg (ref 26.0–34.0)
MCHC: 34.3 g/dL (ref 30.0–36.0)
MCV: 87.2 fL (ref 78.0–100.0)
Platelets: 223 10*3/uL (ref 150–400)
RBC: 4.05 MIL/uL — ABNORMAL LOW (ref 4.22–5.81)
RDW: 14.9 % (ref 11.5–15.5)
WBC: 5.9 10*3/uL (ref 4.0–10.5)

## 2015-03-07 LAB — COMPREHENSIVE METABOLIC PANEL
ALT: 14 U/L — ABNORMAL LOW (ref 17–63)
AST: 18 U/L (ref 15–41)
Albumin: 3.4 g/dL — ABNORMAL LOW (ref 3.5–5.0)
Alkaline Phosphatase: 69 U/L (ref 38–126)
Anion gap: 5 (ref 5–15)
BILIRUBIN TOTAL: 0.6 mg/dL (ref 0.3–1.2)
BUN: 9 mg/dL (ref 6–20)
CO2: 27 mmol/L (ref 22–32)
Calcium: 8.7 mg/dL — ABNORMAL LOW (ref 8.9–10.3)
Chloride: 96 mmol/L — ABNORMAL LOW (ref 101–111)
Creatinine, Ser: 0.91 mg/dL (ref 0.61–1.24)
GFR calc Af Amer: >60 mL/min (ref >60)
GFR calc non Af Amer: >60 mL/min (ref >60)
Glucose, Bld: 89 mg/dL (ref 65–99)
POTASSIUM: 3.8 mmol/L (ref 3.5–5.1)
Sodium: 128 mmol/L — ABNORMAL LOW (ref 135–145)
Total Protein: 6.2 g/dL — ABNORMAL LOW (ref 6.5–8.1)

## 2015-03-07 LAB — PROTIME-INR
INR: 1.21 (ref 0.00–1.49)
Prothrombin Time: 15.5 seconds — ABNORMAL HIGH (ref 11.6–15.2)

## 2015-03-07 LAB — GLUCOSE, CAPILLARY: Glucose-Capillary: 99 mg/dL (ref 65–99)

## 2015-03-07 MED ORDER — LABETALOL HCL 100 MG PO TABS: 50.0000 mg | ORAL_TABLET | Freq: Two times a day (BID) | ORAL | Status: DC

## 2015-03-07 NOTE — Care Management (Signed)
Important Message  Patient Details  Name: Barry Dean MRN: [id number] Date of Birth: 02-Oct-1923   Medicare Important Message Given:  Yes-second notification given    Nathen May 03/07/2015, 3:02 PM

## 2015-03-07 NOTE — Discharge Summary (Signed)
Physician Discharge Summary  Barry Dean GYJ:[id number] DOB: 1924-03-24 DOA: 03/05/2015  PCP: Barry Argyle, MD  Admit date: 03/05/2015 Discharge date: 03/07/2015  Time spent: 45 minutes  Recommendations for Outpatient Follow-up:  1. Needs chem 12 1 week + Magnesium 2. Event monitor being arranged for pick and close follow up by Cardiology being arranged 3. Needs appt with Dr. Jannifer Dean to discuss VImpat use-Vimpat can precipitate arrythmias. 4. recommned HH/PT Ot to follow at [facility] for therapy purposes 5. Med change was labetalol 100-->50 bid 6. Consider further Medicaiton changed to AMloduipine and Benazepril as OP 7. Suspect Sick Sinus Syndome as etiology but am not sure if a gr8 candidate for any type of PPM given chronic debility, low level function-needs to be addressed as OP  Discharge Diagnoses:  Principal Problem:   Syncope and collapse Active Problems:   Seizure disorder   HTN (hypertension)   Hyponatremia   PAF (paroxysmal atrial fibrillation)   Bradycardia   Deafness   Undiagnosed cardiac murmurs   Discharge Condition: fair  Diet recommendation: regular  Filed Weights   03/05/15 1540 03/07/15 0447  Weight: 92.443 kg (203 lb 12.8 oz) 90.855 kg (200 lb 4.8 oz)    History of present illness:  Brief narrative: 81 y/o ? S likely complex partial seizure history + gait disorder followed by Dr. Jannifer Dean, [facility] resident, prior falls 12/2011, with at least 4 or 5 admissions for syncopal episodes over the past 3-4 years, upper GI bleed status post EGD 08/2014 = hiatal hernia gastritis, ongoing subclinical aspiration, mitral regurgitation, paroxysmal A. Fib-Chad2Vasc2~4 Not on AC-on ASA-secondary to risk of GI bleed and high fall risk for note 11/10/12, chronic hyponatremia admitted form [facility] with 2 accidental falls -LOC -Head Trauma -cp -sob -unilat weakness Unwitnessed but per report took 10 min for him to re-orient-wasn't  confused Blood pressure 80/60 on arrival EMS   Hospital Course:   Syncope and collapse Etiology ? 2/2 to Murmur vs Sz[complex partial] or Transient HB--He has no formal diagnosis of parkinson's nor is he on any meds to suggest this is being Rx -Keep on tele -Minimize AV nodal agents-Labetalol already 1/2'd from 100-->50 bid. Consider d/c Amlodipine + ACE -Euvolemic so d/c IV saline 50cc/hr 7/10 -Up with therapy when able -Vimpat has preponderance for arrhythmias and I discussed patient's case with neurology Dr. Nicole Dean on 7/10 who does not feel without significant arrhythmiasthat dosages should be adjusted    Seizure disorder -continue Keppra 750, Lacosamide 50 am, 200 qpm -EEG this admission was neg for any specific seizure    HTN (hypertension) -see above discussion -Consider centrally acting agent [hydralazine if BP trends above 140/80] if htn as OP -not sure if I would continue Labetalol at 100 bid, changed the dose to 50 bid on d/c   Hyponatremia-mild.   PAF (paroxysmal atrial fibrillation), Chad2Vasc2=3-not AC candidate -Caution use with Labetalol -Poor candidate for falls  -Episodic 4 bts Vtach -no pauses above 1.5, bu  did have a few at 1.5 Event monitor set-up by Cardiology -close f/u Dr. Marlou Dean  Undiagnosed cardiac murmurs -Echocardiogram performed 7/10 shows grade 2 diastolic dysfunction with pseudo-normal filling pattern and valve mobility restriction in the aortic valve without stenosis -Clinical exam does not concur with echocardiogram findings as patient has an opening snap? -I will ask Dr. Marlou Dean prior cardiologist to review the patient closely as an outpatient   Bradycardia   Deafness  Physical therapy to assess the patient for appropriateness Patient has had  2 falls recently so there may be some element of dysautonomia. I do not see any evidence of parkinsonism although he has a tremor and this will need to be sorted out by his primary  neurologist Dr. Jannifer Dean as an outpatient   Code Status: Full Family Communication: Discussed with daughter in detail at bedside Disposition Plan: Discharge  To [facility] facility  Consultants:  none  Procedures:  ECHO  Antibiotics:  None   Discharge Exam: Filed Vitals:   03/07/15 0830  BP: 149/73  Pulse: 93  Temp:     General: alert, eating lunch No n/v/cp Cardiovascular: s1 s2 no m/r/g heard Respiratory: clear no added soudn  Discharge Instructions    Current Discharge Medication List    CONTINUE these medications which have NOT CHANGED   Details  amLODipine-benazepril (LOTREL) 5-20 MG per capsule Take 1 capsule by mouth daily.    aspirin 81 MG chewable tablet Chew 1 tablet (81 mg total) by mouth every evening.    HYDROcodone-acetaminophen (NORCO/VICODIN) 5-325 MG per tablet Take 1 tablet by mouth every 8 (eight) hours as needed for moderate pain. Qty: 15 tablet, Refills: 0    iron polysaccharides (NIFEREX) 150 MG capsule Take 150 mg by mouth 2 (two) times daily.     labetalol (NORMODYNE) 100 MG tablet Take 100 mg by mouth 2 (two) times daily.     lacosamide (VIMPAT) 50 MG TABS tablet Take 1 tablet in the AM (50 mg) and 4 tablets (200 mg ) in the PM. Qty: 150 tablet, Refills: 4    levETIRAcetam (KEPPRA) 750 MG tablet Take 750 mg by mouth 2 (two) times daily.     ondansetron (ZOFRAN) 4 MG tablet Take 1 tablet (4 mg total) by mouth every 8 (eight) hours as needed for nausea or vomiting. Qty: 20 tablet, Refills: 0    pantoprazole (PROTONIX) 40 MG tablet Take 1 tablet (40 mg total) by mouth 2 (two) times daily. Qty: 60 tablet, Refills: 2    vitamin B-12 (CYANOCOBALAMIN) 1000 MCG tablet Take 2,000 mcg by mouth daily.    acetaminophen (TYLENOL) 500 MG tablet Take 500 mg by mouth every 6 (six) hours as needed for pain. Not to exceed 3 gms of acetaminophen in 24 hours    cephALEXin (KEFLEX) 500 MG capsule Take 1 capsule (500 mg total) by mouth 4  (four) times daily. Qty: 28 capsule, Refills: 0    polyethylene glycol (MIRALAX / GLYCOLAX) packet Take 17 g by mouth every other day.     sennosides-docusate sodium (SENOKOT-S) 8.6-50 MG tablet Take 1 tablet by mouth at bedtime as needed for constipation.       Allergies  Allergen Reactions  . Topamax Other (See Comments)    Unknown reaction  . Uroxatral [Alfuzosin Hydrochloride] Other (See Comments)    Unknown reaction  . Valium Other (See Comments)    Unknown reaction   Follow-up Information    Follow up with [name].   Specialty:  Cardiology   Why:  [facility] - pick up 30-day heart monitor on Wednesday 03/09/15 at [place] information:   [address] [place] [phone]      Follow up with Melina Copa, PA-C.   Specialties:  Cardiology, Radiology   Why:  [facility] - 04/14/15 at 1:30pm (one of Dr. Marlou Dean' PAs)   Contact information:   [address] [place] [address] [phone]  The results of significant diagnostics from this hospitalization (including imaging, microbiology, ancillary and laboratory) are listed below for reference.    Significant Diagnostic Studies: Ct Head Wo Contrast  03/05/2015   CLINICAL DATA:  Generalized weakness and nauseaHx of Melanoma  EXAM: CT HEAD WITHOUT CONTRAST  TECHNIQUE: Contiguous axial images were obtained from the base of the skull through the vertex without intravenous contrast.  COMPARISON:  01/01/2015  FINDINGS: Atherosclerotic and physiologic intracranial calcifications. Diffuse parenchymal atrophy. Patchy areas of hypoattenuation in deep and periventricular white matter bilaterally. Negative for acute intracranial hemorrhage, mass lesion, acute infarction, midline shift, or mass-effect. Acute infarct may be inapparent on noncontrast CT. Ventricles and sulci symmetric. Bone windows demonstrate no focal lesion.   IMPRESSION: 1. Negative for bleed or other acute intracranial process. 2. Atrophy and nonspecific white matter changes.   Electronically Signed   By: Lucrezia Europe M.D.   On: 03/05/2015 12:56   Dg Chest Port 1 View  03/05/2015   CLINICAL DATA:  Syncope and fall today.  EXAM: PORTABLE CHEST - 1 VIEW  COMPARISON:  01/01/2015  FINDINGS: Bibasilar opacities are stable. Upper normal heart size. No pneumothorax.  IMPRESSION: Stable bibasilar pulmonary opacities.   Electronically Signed   By: Marybelle Killings M.D.   On: 03/05/2015 12:42    Microbiology: No results found for this or any previous visit (from the past 240 hour(s)).   Labs: Basic Metabolic Panel:  Recent Labs Lab 03/05/15 1200 03/07/15 0320  NA 133* 128*  K 4.4 3.8  CL 101 96*  CO2 25 27  GLUCOSE 116* 89  BUN 16 9  CREATININE 1.09 0.91  CALCIUM 8.9 8.7*   Liver Function Tests:  Recent Labs Lab 03/07/15 0320  AST 18  ALT 14*  ALKPHOS 69  BILITOT 0.6  PROT 6.2*  ALBUMIN 3.4*   No results for input(s): LIPASE, AMYLASE in the last 168 hours. No results for input(s): AMMONIA in the last 168 hours. CBC:  Recent Labs Lab 03/05/15 1200 03/07/15 0320  WBC 6.0 5.9  NEUTROABS 3.2  --   HGB 11.9* 12.1*  HCT 35.4* 35.3*  MCV 87.2 87.2  PLT 241 223   Cardiac Enzymes:  Recent Labs Lab 03/05/15 1200 03/05/15 1820 03/06/15 0041 03/06/15 0724  TROPONINI <0.03 <0.03 <0.03 <0.03   BNP: BNP (last 3 results)  Recent Labs  09/13/14 2200 11/12/14 1027  BNP 39.2 98.7    ProBNP (last 3 results) No results for input(s): PROBNP in the last 8760 hours.  CBG:  Recent Labs Lab 03/06/15 0727 03/07/15 0726  GLUCAP 78 99       Signed:  Nita Sells  [facility] 03/07/2015, 12:30 PM

## 2015-03-07 NOTE — Progress Notes (Signed)
OT Cancellation Note  Patient Details Name: Barry Dean MRN: [id number] DOB: 02-25-1924   Cancelled Treatment:    Reason Eval/Treat Not Completed: Other (comment). Pt planning to d/c back to ALF today. Spoke with pt's daughter and pt receiving a lot of assist for ADLs, PTA. Deferring all further OT needs to next venue of care.   Benito Mccreedy OTR/L [phone] 03/07/2015, 12:06 PM

## 2015-03-07 NOTE — Progress Notes (Signed)
EEG Completed; Results Pending  

## 2015-03-07 NOTE — Progress Notes (Signed)
Dr. Verlon Au curbsided [place] to discuss patient - I discussed details with Dr. Sallyanne Kuster. Will plan 30 day event monitor with follow-up.   Pick up event monitor on Wednesday 03/09/15 at 11am.  F/u appt with me in [facility] office 8/18 at 1:30pm.  Melina Copa PA-C

## 2015-03-07 NOTE — Discharge Instructions (Addendum)
Syncope °Syncope is a medical term for fainting or passing out. This means you lose consciousness and drop to the ground. People are generally unconscious for less than 5 minutes. You may have some muscle twitches for up to 15 seconds before waking up and returning to normal. Syncope occurs more often in older adults, but it can happen to anyone. While most causes of syncope are not dangerous, syncope can be a sign of a serious medical problem. It is important to seek medical care.  °CAUSES  °Syncope is caused by a sudden drop in blood flow to the brain. The specific cause is often not determined. Factors that can bring on syncope include: °· Taking medicines that lower blood pressure. °· Sudden changes in posture, such as standing up quickly. °· Taking more medicine than prescribed. °· Standing in one place for too long. °· Seizure disorders. °· Dehydration and excessive exposure to heat. °· Low blood sugar (hypoglycemia). °· Straining to have a bowel movement. °· Heart disease, irregular heartbeat, or other circulatory problems. °· Fear, emotional distress, seeing blood, or severe pain. °SYMPTOMS  °Right before fainting, you may: °· Feel dizzy or light-headed. °· Feel nauseous. °· See all white or all black in your field of vision. °· Have cold, clammy skin. °DIAGNOSIS  °Your health care provider will ask about your symptoms, perform a physical exam, and perform an electrocardiogram (ECG) to record the electrical activity of your heart. Your health care provider may also perform other heart or blood tests to determine the cause of your syncope which may include: °· Transthoracic echocardiogram (TTE). During echocardiography, sound waves are used to evaluate how blood flows through your heart. °· Transesophageal echocardiogram (TEE). °· Cardiac monitoring. This allows your health care provider to monitor your heart rate and rhythm in real time. °· Holter monitor. This is a portable device that records your  heartbeat and can help diagnose heart arrhythmias. It allows your health care provider to track your heart activity for several days, if needed. °· Stress tests by exercise or by giving medicine that makes the heart beat faster. °TREATMENT  °In most cases, no treatment is needed. Depending on the cause of your syncope, your health care provider may recommend changing or stopping some of your medicines. °HOME CARE INSTRUCTIONS °· Have someone stay with you until you feel stable. °· Do not drive, use machinery, or play sports until your health care provider says it is okay. °· Keep all follow-up appointments as directed by your health care provider. °· Lie down right away if you start feeling like you might faint. Breathe deeply and steadily. Wait until all the symptoms have passed. °· Drink enough fluids to keep your urine clear or pale yellow. °· If you are taking blood pressure or heart medicine, get up slowly and take several minutes to sit and then stand. This can reduce dizziness. °SEEK IMMEDIATE MEDICAL CARE IF:  °· You have a severe headache. °· You have unusual pain in the chest, abdomen, or back. °· You are bleeding from your mouth or rectum, or you have black or tarry stool. °· You have an irregular or very fast heartbeat. °· You have pain with breathing. °· You have repeated fainting or seizure-like jerking during an episode. °· You faint when sitting or lying down. °· You have confusion. °· You have trouble walking. °· You have severe weakness. °· You have vision problems. °If you fainted, call your local emergency services (911 in [place] Do not drive   yourself to the hospital.  °MAKE SURE YOU: °· Understand these instructions. °· Will watch your condition. °· Will get help right away if you are not doing well or get worse. °Document Released: 08/13/2005 Document Revised: 08/18/2013 Document Reviewed: 10/12/2011 °ExitCare® Patient Information ©2015 [name]. This information is not intended to replace  advice given to you by your health care provider. Make sure you discuss any questions you have with your health care provider. ° °

## 2015-03-07 NOTE — Procedures (Signed)
ELECTROENCEPHALOGRAM REPORT   Patient: Barry Dean      Room #: 5K-53 Age: 81 y.o.        Sex: male Referring Physician: Dr Verlon Au Report Date:  03/07/2015        Interpreting Physician: Hulen Luster  History: Barry Dean is an 81 y.o. male hx of PD, presenting with recurrent syncopal episodes  Medications:  Scheduled: . amLODipine  5 mg Oral Daily  . aspirin  81 mg Oral QPM  . benazepril  20 mg Oral Daily  . docusate sodium  100 mg Oral BID  . enoxaparin (LOVENOX) injection  40 mg Subcutaneous Q24H  . iron polysaccharides  150 mg Oral BID  . labetalol  50 mg Oral BID  . lacosamide  200 mg Oral QHS  . lacosamide  50 mg Oral q morning - 10a  . levETIRAcetam  750 mg Oral BID  . pantoprazole  40 mg Oral Daily  . polyethylene glycol  17 g Oral QODAY  . sodium chloride  3 mL Intravenous Q12H    Conditions of Recording:  This is a 16 channel EEG carried out with the patient in the drowsy state.  Description:  The waking background activity consists of a low voltage, symmetrical, fairly well organized, 8-9 Hz alpha activity with frequent theta activity intermixed in, seen from the parieto-occipital and posterior temporal regions.  Low voltage fast activity, poorly organized, is seen anteriorly and is at times superimposed on more posterior regions.  A mixture of theta and alpha rhythms are seen from the central and temporal regions. No focal slowing or epileptiform activity noted.   The patient drowses with slowing to irregular, low voltage theta and beta activity. Normal sleep architecture is not observed.   Hyperventilation was not performed.  Intermittent photic stimulation not observed.   IMPRESSION: Normal electroencephalogram. There are no focal lateralizing or epileptiform features.   Jim Like, DO [facility] [phone]  If 7pm- 7am, please page neurology on call as listed in [place]. 03/07/2015, 10:38 AM

## 2015-03-08 ENCOUNTER — Ambulatory Visit (INDEPENDENT_AMBULATORY_CARE_PROVIDER_SITE_OTHER): Payer: Medicare Other

## 2015-03-08 DIAGNOSIS — R55 Syncope and collapse: Secondary | ICD-10-CM

## 2015-03-08 LAB — LEVETIRACETAM LEVEL: Levetiracetam Lvl: 12.6 ug/mL (ref 10.0–40.0)

## 2015-03-10 DIAGNOSIS — R633 Feeding difficulties: Secondary | ICD-10-CM | POA: Diagnosis not present

## 2015-03-10 DIAGNOSIS — M6281 Muscle weakness (generalized): Secondary | ICD-10-CM | POA: Diagnosis not present

## 2015-03-10 DIAGNOSIS — R262 Difficulty in walking, not elsewhere classified: Secondary | ICD-10-CM | POA: Diagnosis not present

## 2015-03-11 DIAGNOSIS — R262 Difficulty in walking, not elsewhere classified: Secondary | ICD-10-CM | POA: Diagnosis not present

## 2015-03-11 DIAGNOSIS — M6281 Muscle weakness (generalized): Secondary | ICD-10-CM | POA: Diagnosis not present

## 2015-03-11 DIAGNOSIS — R633 Feeding difficulties: Secondary | ICD-10-CM | POA: Diagnosis not present

## 2015-03-14 DIAGNOSIS — R262 Difficulty in walking, not elsewhere classified: Secondary | ICD-10-CM | POA: Diagnosis not present

## 2015-03-14 DIAGNOSIS — R633 Feeding difficulties: Secondary | ICD-10-CM | POA: Diagnosis not present

## 2015-03-14 DIAGNOSIS — M6281 Muscle weakness (generalized): Secondary | ICD-10-CM | POA: Diagnosis not present

## 2015-03-15 DIAGNOSIS — R633 Feeding difficulties: Secondary | ICD-10-CM | POA: Diagnosis not present

## 2015-03-15 DIAGNOSIS — R262 Difficulty in walking, not elsewhere classified: Secondary | ICD-10-CM | POA: Diagnosis not present

## 2015-03-15 DIAGNOSIS — M6281 Muscle weakness (generalized): Secondary | ICD-10-CM | POA: Diagnosis not present

## 2015-03-16 DIAGNOSIS — R262 Difficulty in walking, not elsewhere classified: Secondary | ICD-10-CM | POA: Diagnosis not present

## 2015-03-16 DIAGNOSIS — R633 Feeding difficulties: Secondary | ICD-10-CM | POA: Diagnosis not present

## 2015-03-16 DIAGNOSIS — M6281 Muscle weakness (generalized): Secondary | ICD-10-CM | POA: Diagnosis not present

## 2015-03-17 DIAGNOSIS — G40909 Epilepsy, unspecified, not intractable, without status epilepticus: Secondary | ICD-10-CM | POA: Diagnosis not present

## 2015-03-17 DIAGNOSIS — R262 Difficulty in walking, not elsewhere classified: Secondary | ICD-10-CM | POA: Diagnosis not present

## 2015-03-17 DIAGNOSIS — M6281 Muscle weakness (generalized): Secondary | ICD-10-CM | POA: Diagnosis not present

## 2015-03-17 DIAGNOSIS — E222 Syndrome of inappropriate secretion of antidiuretic hormone: Secondary | ICD-10-CM | POA: Diagnosis not present

## 2015-03-17 DIAGNOSIS — R633 Feeding difficulties: Secondary | ICD-10-CM | POA: Diagnosis not present

## 2015-03-17 DIAGNOSIS — R197 Diarrhea, unspecified: Secondary | ICD-10-CM | POA: Diagnosis not present

## 2015-03-17 DIAGNOSIS — I480 Paroxysmal atrial fibrillation: Secondary | ICD-10-CM | POA: Diagnosis not present

## 2015-03-17 DIAGNOSIS — R55 Syncope and collapse: Secondary | ICD-10-CM | POA: Diagnosis not present

## 2015-03-17 DIAGNOSIS — Z79899 Other long term (current) drug therapy: Secondary | ICD-10-CM | POA: Diagnosis not present

## 2015-03-18 DIAGNOSIS — R633 Feeding difficulties: Secondary | ICD-10-CM | POA: Diagnosis not present

## 2015-03-18 DIAGNOSIS — M6281 Muscle weakness (generalized): Secondary | ICD-10-CM | POA: Diagnosis not present

## 2015-03-18 DIAGNOSIS — R262 Difficulty in walking, not elsewhere classified: Secondary | ICD-10-CM | POA: Diagnosis not present

## 2015-03-21 DIAGNOSIS — M6281 Muscle weakness (generalized): Secondary | ICD-10-CM | POA: Diagnosis not present

## 2015-03-21 DIAGNOSIS — R262 Difficulty in walking, not elsewhere classified: Secondary | ICD-10-CM | POA: Diagnosis not present

## 2015-03-21 DIAGNOSIS — R633 Feeding difficulties: Secondary | ICD-10-CM | POA: Diagnosis not present

## 2015-03-22 DIAGNOSIS — R633 Feeding difficulties: Secondary | ICD-10-CM | POA: Diagnosis not present

## 2015-03-22 DIAGNOSIS — M6281 Muscle weakness (generalized): Secondary | ICD-10-CM | POA: Diagnosis not present

## 2015-03-22 DIAGNOSIS — R262 Difficulty in walking, not elsewhere classified: Secondary | ICD-10-CM | POA: Diagnosis not present

## 2015-03-24 DIAGNOSIS — R633 Feeding difficulties: Secondary | ICD-10-CM | POA: Diagnosis not present

## 2015-03-24 DIAGNOSIS — R262 Difficulty in walking, not elsewhere classified: Secondary | ICD-10-CM | POA: Diagnosis not present

## 2015-03-24 DIAGNOSIS — M6281 Muscle weakness (generalized): Secondary | ICD-10-CM | POA: Diagnosis not present

## 2015-03-28 DIAGNOSIS — R262 Difficulty in walking, not elsewhere classified: Secondary | ICD-10-CM | POA: Diagnosis not present

## 2015-03-28 DIAGNOSIS — M6281 Muscle weakness (generalized): Secondary | ICD-10-CM | POA: Diagnosis not present

## 2015-03-28 DIAGNOSIS — R633 Feeding difficulties: Secondary | ICD-10-CM | POA: Diagnosis not present

## 2015-03-29 DIAGNOSIS — R633 Feeding difficulties: Secondary | ICD-10-CM | POA: Diagnosis not present

## 2015-03-29 DIAGNOSIS — R262 Difficulty in walking, not elsewhere classified: Secondary | ICD-10-CM | POA: Diagnosis not present

## 2015-03-29 DIAGNOSIS — M6281 Muscle weakness (generalized): Secondary | ICD-10-CM | POA: Diagnosis not present

## 2015-03-30 DIAGNOSIS — R633 Feeding difficulties: Secondary | ICD-10-CM | POA: Diagnosis not present

## 2015-03-30 DIAGNOSIS — M6281 Muscle weakness (generalized): Secondary | ICD-10-CM | POA: Diagnosis not present

## 2015-03-30 DIAGNOSIS — R262 Difficulty in walking, not elsewhere classified: Secondary | ICD-10-CM | POA: Diagnosis not present

## 2015-03-30 DIAGNOSIS — N390 Urinary tract infection, site not specified: Secondary | ICD-10-CM | POA: Diagnosis not present

## 2015-03-31 DIAGNOSIS — R633 Feeding difficulties: Secondary | ICD-10-CM | POA: Diagnosis not present

## 2015-03-31 DIAGNOSIS — M6281 Muscle weakness (generalized): Secondary | ICD-10-CM | POA: Diagnosis not present

## 2015-03-31 DIAGNOSIS — R262 Difficulty in walking, not elsewhere classified: Secondary | ICD-10-CM | POA: Diagnosis not present

## 2015-04-01 DIAGNOSIS — R262 Difficulty in walking, not elsewhere classified: Secondary | ICD-10-CM | POA: Diagnosis not present

## 2015-04-01 DIAGNOSIS — R633 Feeding difficulties: Secondary | ICD-10-CM | POA: Diagnosis not present

## 2015-04-01 DIAGNOSIS — M6281 Muscle weakness (generalized): Secondary | ICD-10-CM | POA: Diagnosis not present

## 2015-04-04 ENCOUNTER — Telehealth: Payer: Self-pay | Admitting: Physician Assistant

## 2015-04-04 DIAGNOSIS — R633 Feeding difficulties: Secondary | ICD-10-CM | POA: Diagnosis not present

## 2015-04-04 DIAGNOSIS — R262 Difficulty in walking, not elsewhere classified: Secondary | ICD-10-CM | POA: Diagnosis not present

## 2015-04-04 DIAGNOSIS — M6281 Muscle weakness (generalized): Secondary | ICD-10-CM | POA: Diagnosis not present

## 2015-04-04 NOTE — Telephone Encounter (Signed)
New message     Pt daughter states heart monitor was not working correctly and assisted living facility was not keeping monitor hooked up Pt daughter states pt could not push button on monitor because of his age and parkinson disease Daughter states she sent monitor back to company today and wants to know if she can get results from the machine instead of having to bring pt to next appt. Pt daughter wants to know if pt still needs to come to visit this month Please call to discuss

## 2015-04-05 DIAGNOSIS — R633 Feeding difficulties: Secondary | ICD-10-CM | POA: Diagnosis not present

## 2015-04-05 DIAGNOSIS — R262 Difficulty in walking, not elsewhere classified: Secondary | ICD-10-CM | POA: Diagnosis not present

## 2015-04-05 DIAGNOSIS — M6281 Muscle weakness (generalized): Secondary | ICD-10-CM | POA: Diagnosis not present

## 2015-04-06 DIAGNOSIS — R633 Feeding difficulties: Secondary | ICD-10-CM | POA: Diagnosis not present

## 2015-04-06 DIAGNOSIS — R262 Difficulty in walking, not elsewhere classified: Secondary | ICD-10-CM | POA: Diagnosis not present

## 2015-04-06 DIAGNOSIS — M6281 Muscle weakness (generalized): Secondary | ICD-10-CM | POA: Diagnosis not present

## 2015-04-07 DIAGNOSIS — M6281 Muscle weakness (generalized): Secondary | ICD-10-CM | POA: Diagnosis not present

## 2015-04-07 DIAGNOSIS — R633 Feeding difficulties: Secondary | ICD-10-CM | POA: Diagnosis not present

## 2015-04-07 DIAGNOSIS — R262 Difficulty in walking, not elsewhere classified: Secondary | ICD-10-CM | POA: Diagnosis not present

## 2015-04-07 NOTE — Telephone Encounter (Signed)
Spoke with daughter about the limited amount of information received on monitor.  Pt and staff had difficulty keeping monitor on and leads attached.  Daughter turned it in early.  Advised Dr Marlou Porch has not reviewed information at this time.  She reports the pt is doing OK and is going to see his neurologist soon.  She does not feel as if he needs to be seen here at this time.  She is cancelling his appt and will call back if further questions or concerns.

## 2015-04-11 DIAGNOSIS — R633 Feeding difficulties: Secondary | ICD-10-CM | POA: Diagnosis not present

## 2015-04-11 DIAGNOSIS — M6281 Muscle weakness (generalized): Secondary | ICD-10-CM | POA: Diagnosis not present

## 2015-04-11 DIAGNOSIS — R262 Difficulty in walking, not elsewhere classified: Secondary | ICD-10-CM | POA: Diagnosis not present

## 2015-04-12 DIAGNOSIS — R262 Difficulty in walking, not elsewhere classified: Secondary | ICD-10-CM | POA: Diagnosis not present

## 2015-04-12 DIAGNOSIS — R633 Feeding difficulties: Secondary | ICD-10-CM | POA: Diagnosis not present

## 2015-04-12 DIAGNOSIS — M6281 Muscle weakness (generalized): Secondary | ICD-10-CM | POA: Diagnosis not present

## 2015-04-13 DIAGNOSIS — R262 Difficulty in walking, not elsewhere classified: Secondary | ICD-10-CM | POA: Diagnosis not present

## 2015-04-13 DIAGNOSIS — M6281 Muscle weakness (generalized): Secondary | ICD-10-CM | POA: Diagnosis not present

## 2015-04-13 DIAGNOSIS — R633 Feeding difficulties: Secondary | ICD-10-CM | POA: Diagnosis not present

## 2015-04-14 ENCOUNTER — Encounter: Payer: Federal, State, Local not specified - PPO | Admitting: Physician Assistant

## 2015-04-14 DIAGNOSIS — R262 Difficulty in walking, not elsewhere classified: Secondary | ICD-10-CM | POA: Diagnosis not present

## 2015-04-14 DIAGNOSIS — R633 Feeding difficulties: Secondary | ICD-10-CM | POA: Diagnosis not present

## 2015-04-14 DIAGNOSIS — M6281 Muscle weakness (generalized): Secondary | ICD-10-CM | POA: Diagnosis not present

## 2015-04-15 DIAGNOSIS — M6281 Muscle weakness (generalized): Secondary | ICD-10-CM | POA: Diagnosis not present

## 2015-04-15 DIAGNOSIS — R633 Feeding difficulties: Secondary | ICD-10-CM | POA: Diagnosis not present

## 2015-04-15 DIAGNOSIS — R262 Difficulty in walking, not elsewhere classified: Secondary | ICD-10-CM | POA: Diagnosis not present

## 2015-04-18 DIAGNOSIS — R633 Feeding difficulties: Secondary | ICD-10-CM | POA: Diagnosis not present

## 2015-04-18 DIAGNOSIS — M6281 Muscle weakness (generalized): Secondary | ICD-10-CM | POA: Diagnosis not present

## 2015-04-18 DIAGNOSIS — R262 Difficulty in walking, not elsewhere classified: Secondary | ICD-10-CM | POA: Diagnosis not present

## 2015-04-19 ENCOUNTER — Ambulatory Visit (INDEPENDENT_AMBULATORY_CARE_PROVIDER_SITE_OTHER): Payer: Medicare Other | Admitting: Neurology

## 2015-04-19 ENCOUNTER — Encounter: Payer: Self-pay | Admitting: Neurology

## 2015-04-19 VITALS — BP 119/66 | HR 70 | Ht 74.0 in | Wt 201.5 lb

## 2015-04-19 DIAGNOSIS — R262 Difficulty in walking, not elsewhere classified: Secondary | ICD-10-CM | POA: Diagnosis not present

## 2015-04-19 DIAGNOSIS — R633 Feeding difficulties: Secondary | ICD-10-CM | POA: Diagnosis not present

## 2015-04-19 DIAGNOSIS — G40909 Epilepsy, unspecified, not intractable, without status epilepticus: Secondary | ICD-10-CM

## 2015-04-19 DIAGNOSIS — G20 Parkinson's disease: Secondary | ICD-10-CM | POA: Diagnosis not present

## 2015-04-19 DIAGNOSIS — M6281 Muscle weakness (generalized): Secondary | ICD-10-CM | POA: Diagnosis not present

## 2015-04-19 MED ORDER — LEVETIRACETAM 500 MG PO TABS: ORAL_TABLET | ORAL | Status: DC

## 2015-04-19 NOTE — Patient Instructions (Addendum)
We will decrease the Vimpat to 50 mg in the morning and 150 mg in the evening. We will go  up on the keppra taking 750 mg in the morning and 1000 mg in the evening.    Epilepsy Epilepsy is a disorder in which a person has repeated seizures over time. A seizure is a release of abnormal electrical activity in the brain. Seizures can cause a change in attention, behavior, or the ability to remain awake and alert (altered mental status). Seizures often involve uncontrollable shaking (convulsions).  Most people with epilepsy lead normal lives. However, people with epilepsy are at an increased risk of falls, accidents, and injuries. Therefore, it is important to begin treatment right away. CAUSES  Epilepsy has many possible causes. Anything that disturbs the normal pattern of brain cell activity can lead to seizures. This may include:   Head injury.  Birth trauma.  High fever as a child.  Stroke.  Bleeding into or around the brain.  Certain drugs.  Prolonged low oxygen, such as what occurs after CPR efforts.  Abnormal brain development.  Certain illnesses, such as meningitis, encephalitis (brain infection), malaria, and other infections.  An imbalance of nerve signaling chemicals (neurotransmitters).  SIGNS AND SYMPTOMS  The symptoms of a seizure can vary greatly from one person to another. Right before a seizure, you may have a warning (aura) that a seizure is about to occur. An aura may include the following symptoms:  Fear or anxiety.  Nausea.  Feeling like the room is spinning (vertigo).  Vision changes, such as seeing flashing lights or spots. Common symptoms during a seizure include:  Abnormal sensations, such as an abnormal smell or a bitter taste in the mouth.   Sudden, general body stiffness.   Convulsions that involve rhythmic jerking of the face, arm, or leg on one or both sides.   Sudden change in consciousness.   Appearing to be awake but not  responding.   Appearing to be asleep but cannot be awakened.   Grimacing, chewing, lip smacking, drooling, tongue biting, or loss of bowel or bladder control. After a seizure, you may feel sleepy for a while. DIAGNOSIS  Your health care provider will ask about your symptoms and take a medical history. Descriptions from any witnesses to your seizures will be very helpful in the diagnosis. A physical exam, including a detailed neurological exam, is necessary. Various tests may be done, such as:   An electroencephalogram (EEG). This is a painless test of your brain waves. In this test, a diagram is created of your brain waves. These diagrams can be interpreted by a specialist.  An MRI of the brain.   A CT scan of the brain.   A spinal tap (lumbar puncture, LP).  Blood tests to check for signs of infection or abnormal blood chemistry. TREATMENT  There is no cure for epilepsy, but it is generally treatable. Once epilepsy is diagnosed, it is important to begin treatment as soon as possible. For most people with epilepsy, seizures can be controlled with medicines. The following may also be used:  A pacemaker for the brain (vagus nerve stimulator) can be used for people with seizures that are not well controlled by medicine.  Surgery on the brain. For some people, epilepsy eventually goes away. HOME CARE INSTRUCTIONS   Follow your health care provider's recommendations on driving and safety in normal activities.  Get enough rest. Lack of sleep can cause seizures.  Only take  over-the-counter or prescription medicines as directed by your health care provider. Take any prescribed medicine exactly as directed.  Avoid any known triggers of your seizures.  Keep a seizure diary. Record what you recall about any seizure, especially any possible trigger.   Make sure the people you live and work with know that you are prone to seizures. They should receive instructions on how to help you. In  general, a witness to a seizure should:   Cushion your head and body.   Turn you on your side.   Avoid unnecessarily restraining you.   Not place anything inside your mouth.   Call for emergency medical help if there is any question about what has occurred.   Follow up with your health care provider as directed. You may need regular blood tests to monitor the levels of your medicine.  SEEK MEDICAL CARE IF:   You develop signs of infection or other illness. This might increase the risk of a seizure.   You seem to be having more frequent seizures.   Your seizure pattern is changing.  SEEK IMMEDIATE MEDICAL CARE IF:   You have a seizure that does not stop after a few moments.   You have a seizure that causes any difficulty in breathing.   You have a seizure that results in a very severe headache.   You have a seizure that leaves you with the inability to speak or use a part of your body.  Document Released: 08/13/2005 Document Revised: 06/03/2013 Document Reviewed: 03/25/2013 [facility] Patient Information 2015 [place], [place]. This information is not intended to replace advice given to you by your health care provider. Make sure you discuss any questions you have with your health care provider.

## 2015-04-19 NOTE — Progress Notes (Signed)
Reason for visit: Seizures  Barry Dean is an 81 y.o. male  History of present illness:  Barry Dean is an 81 year old left-handed white male with a history of seizure-type events. He has episodes of a laughing sound followed by brief staring episode lasting only a few seconds. He may have multiple events in the course of a month. The patient has recently had some episodes of falling. He has fallen around May 6, 12th of June, 24th of June, and on July 4. The patient had an event of unresponsiveness on July 9, requiring a hospitalization. The EMS was called, no vitals could initially be obtained. The patient was brought into the hospital, and an evaluation was unremarkable. EEG evaluation in the hospital was normal. There were some concerns that the Vimpat may be causing issues, as Vimpat can result in AV transmission block. On 03/08/2015, the patient was noted to fall, he was unresponsive for a period of time. The patient was placed on a 30 day cardiac monitor, but no events were noted during this study. The patient comes to this office for an evaluation.    Past Medical History  Diagnosis Date  . Hypertension   . Seizures   . A-fib   . Dehydration   . Deafness   . Anxiety   . Reflux   . Arthritis   . Depression   . Atrial fibrillation   . Tremor, essential   . Seizure   . BPH (benign prostatic hyperplasia)   . Syncope   . Deaf   . Melanoma   . GERD (gastroesophageal reflux disease)   . Mitral regurgitation   . Abnormality of gait     Past Surgical History  Procedure Laterality Date  . Unknown    . Appendectomy    . Cataract extraction w/ intraocular lens  implant, bilateral    . Esophagogastroduodenoscopy (egd) with propofol N/A 09/15/2014    Procedure: ESOPHAGOGASTRODUODENOSCOPY (EGD) WITH PROPOFOL;  Surgeon: [name], MD;  Location: [name] ENDOSCOPY;  Service: Endoscopy;  Laterality: N/A;    Family History  Problem Relation Age of Onset  . Cancer Mother    Skin cancer  . Stroke Father   . Parkinsonism Sister   . Diabetes Brother     Social history:  reports that he has never smoked. He does not have any smokeless tobacco history on file. He reports that he does not drink alcohol or use illicit drugs.    Allergies  Allergen Reactions  . Topamax Other (See Comments)    Unknown reaction  . Uroxatral [Alfuzosin Hydrochloride] Other (See Comments)    Unknown reaction  . Valium Other (See Comments)    Unknown reaction    Medications:  Prior to Admission medications   Medication Sig Start Date End Date Taking? Authorizing Provider  acetaminophen (TYLENOL) 500 MG tablet Take 500 mg by mouth every 6 (six) hours as needed for pain. Not to exceed 3 gms of acetaminophen in 24 hours   Yes Historical Provider, MD  amLODipine-benazepril (LOTREL) 5-20 MG per capsule Take 1 capsule by mouth daily.   Yes Historical Provider, MD  aspirin 81 MG chewable tablet Chew 1 tablet (81 mg total) by mouth every evening. 09/22/14  Yes Thurnell Lose, MD  HYDROcodone-acetaminophen (NORCO/VICODIN) 5-325 MG per tablet Take 1 tablet by mouth every 8 (eight) hours as needed for moderate pain.   Yes Historical Provider, MD  iron polysaccharides (NIFEREX) 150 MG capsule Take 150 mg by mouth 2 (  two) times daily.    Yes Historical Provider, MD  lacosamide (VIMPAT) 50 MG TABS tablet Take 1 tablet in the AM (50 mg) and 4 tablets (200 mg ) in the PM. Patient taking differently: Take 50-200 mg by mouth 2 (two) times daily. Take 1 tablet in the AM (50 mg) and 4 tablets (200 mg ) in the PM. 10/14/14  Yes Ward Givens, NP  levETIRAcetam (KEPPRA) 750 MG tablet Take 750 mg by mouth 2 (two) times daily.  07/12/14  Yes Historical Provider, MD  ondansetron (ZOFRAN) 4 MG tablet Take 1 tablet (4 mg total) by mouth every 8 (eight) hours as needed for nausea or vomiting. 09/17/14  Yes Thurnell Lose, MD  pantoprazole (PROTONIX) 40 MG tablet Take 1 tablet (40 mg total) by mouth 2 (two)  times daily. Patient taking differently: Take 40 mg by mouth daily.  09/17/14  Yes Thurnell Lose, MD  sennosides-docusate sodium (SENOKOT-S) 8.6-50 MG tablet Take 1 tablet by mouth at bedtime as needed for constipation.   Yes Historical Provider, MD  vitamin B-12 (CYANOCOBALAMIN) 1000 MCG tablet Take 2,000 mcg by mouth daily.   Yes Historical Provider, MD    ROS:  Out of a complete 14 system review of symptoms, the patient complains only of the following symptoms, and all other reviewed systems are negative.  Decreased activity Hearing loss, drooling Shortness of breath Heart murmur Black stools, diarrhea, incontinence of bowel Snoring Frequent infections Incontinence of bladder, frequency of urination Walking difficulty Memory loss, seizure, passing out, confusion  Blood pressure 119/66, pulse 70, height 6\' 2"  (1.88 m), weight 201 lb 8 oz (91.4 kg).  Physical Exam  General: The patient is alert and cooperative at the time of the examination.  Skin: No significant peripheral edema is noted.   Neurologic Exam  Mental status: The patient is alert and oriented x 3 at the time of the examination. The patient has apparent normal recent and remote memory, with an apparently normal attention span and concentration ability.   Cranial nerves: Facial symmetry is present. Speech is normal, no aphasia or dysarthria is noted. Extraocular movements are full. Visual fields are full. The patient keeps his head in flexion.  Motor: The patient has good strength in all 4 extremities.  Sensory examination: Soft touch sensation is symmetric on the face, arms, and legs. Some apraxia with the use of the extremities is noted.  Coordination: The patient has good finger-nose-finger and heel-to-shin bilaterally. A resting tremors noted with the left upper extremity.  Gait and station: The patient requires assistance with standing, he came in a wheelchair today. After standing up, the patient is  unable to stand independently, he has a tendency to lean backwards. He will ambulate with assistance, slightly wide-based gait is noted. Tandem gait was not attempted.  Reflexes: Deep tendon reflexes are symmetric.   Assessment/Plan:  1. History of seizures  2. Recent episode of unconsciousness  3. Gait disorder  The patient has uncontrolled seizures. The patient had a different type of episode associated with unresponsiveness, and the vitals could not be obtained. There are concerns for cardiac rhythm issues. The Vimpat could pose a problem, we will try to reduce the dose of it and go up on the [place]. The Vimpat will be reduced to 50 g the morning and 150 mg in the evening. Keppra will be increased to 750 mg in the morning, and 1000 mg at night. The patient does have some daytime drowsiness on the medication. He  will follow-up in 4 months. The patient could be developing some features of Parkinson's disease, this will need to be followed.  Jill Alexanders MD 04/19/2015 7:30 PM  [facility] [address] [place], [place] [address]  Phone [phone] Fax [phone]

## 2015-04-20 DIAGNOSIS — R633 Feeding difficulties: Secondary | ICD-10-CM | POA: Diagnosis not present

## 2015-04-20 DIAGNOSIS — M6281 Muscle weakness (generalized): Secondary | ICD-10-CM | POA: Diagnosis not present

## 2015-04-20 DIAGNOSIS — R262 Difficulty in walking, not elsewhere classified: Secondary | ICD-10-CM | POA: Diagnosis not present

## 2015-04-21 DIAGNOSIS — M6281 Muscle weakness (generalized): Secondary | ICD-10-CM | POA: Diagnosis not present

## 2015-04-21 DIAGNOSIS — R633 Feeding difficulties: Secondary | ICD-10-CM | POA: Diagnosis not present

## 2015-04-21 DIAGNOSIS — R262 Difficulty in walking, not elsewhere classified: Secondary | ICD-10-CM | POA: Diagnosis not present

## 2015-04-25 DIAGNOSIS — R262 Difficulty in walking, not elsewhere classified: Secondary | ICD-10-CM | POA: Diagnosis not present

## 2015-04-25 DIAGNOSIS — M6281 Muscle weakness (generalized): Secondary | ICD-10-CM | POA: Diagnosis not present

## 2015-04-25 DIAGNOSIS — R633 Feeding difficulties: Secondary | ICD-10-CM | POA: Diagnosis not present

## 2015-04-26 DIAGNOSIS — R633 Feeding difficulties: Secondary | ICD-10-CM | POA: Diagnosis not present

## 2015-04-26 DIAGNOSIS — M6281 Muscle weakness (generalized): Secondary | ICD-10-CM | POA: Diagnosis not present

## 2015-04-26 DIAGNOSIS — R262 Difficulty in walking, not elsewhere classified: Secondary | ICD-10-CM | POA: Diagnosis not present

## 2015-04-27 DIAGNOSIS — M6281 Muscle weakness (generalized): Secondary | ICD-10-CM | POA: Diagnosis not present

## 2015-04-27 DIAGNOSIS — R633 Feeding difficulties: Secondary | ICD-10-CM | POA: Diagnosis not present

## 2015-04-27 DIAGNOSIS — R262 Difficulty in walking, not elsewhere classified: Secondary | ICD-10-CM | POA: Diagnosis not present

## 2015-04-28 DIAGNOSIS — R633 Feeding difficulties: Secondary | ICD-10-CM | POA: Diagnosis not present

## 2015-04-28 DIAGNOSIS — R262 Difficulty in walking, not elsewhere classified: Secondary | ICD-10-CM | POA: Diagnosis not present

## 2015-04-28 DIAGNOSIS — M6281 Muscle weakness (generalized): Secondary | ICD-10-CM | POA: Diagnosis not present

## 2015-05-03 DIAGNOSIS — R633 Feeding difficulties: Secondary | ICD-10-CM | POA: Diagnosis not present

## 2015-05-03 DIAGNOSIS — M6281 Muscle weakness (generalized): Secondary | ICD-10-CM | POA: Diagnosis not present

## 2015-05-03 DIAGNOSIS — R262 Difficulty in walking, not elsewhere classified: Secondary | ICD-10-CM | POA: Diagnosis not present

## 2015-05-05 DIAGNOSIS — R633 Feeding difficulties: Secondary | ICD-10-CM | POA: Diagnosis not present

## 2015-05-05 DIAGNOSIS — R262 Difficulty in walking, not elsewhere classified: Secondary | ICD-10-CM | POA: Diagnosis not present

## 2015-05-05 DIAGNOSIS — M6281 Muscle weakness (generalized): Secondary | ICD-10-CM | POA: Diagnosis not present

## 2015-06-30 DIAGNOSIS — I10 Essential (primary) hypertension: Secondary | ICD-10-CM | POA: Diagnosis not present

## 2015-06-30 DIAGNOSIS — E222 Syndrome of inappropriate secretion of antidiuretic hormone: Secondary | ICD-10-CM | POA: Diagnosis not present

## 2015-06-30 DIAGNOSIS — H6121 Impacted cerumen, right ear: Secondary | ICD-10-CM | POA: Diagnosis not present

## 2015-06-30 DIAGNOSIS — R29818 Other symptoms and signs involving the nervous system: Secondary | ICD-10-CM | POA: Diagnosis not present

## 2015-06-30 DIAGNOSIS — M6281 Muscle weakness (generalized): Secondary | ICD-10-CM | POA: Diagnosis not present

## 2015-06-30 DIAGNOSIS — R293 Abnormal posture: Secondary | ICD-10-CM | POA: Diagnosis not present

## 2015-06-30 DIAGNOSIS — R262 Difficulty in walking, not elsewhere classified: Secondary | ICD-10-CM | POA: Diagnosis not present

## 2015-06-30 DIAGNOSIS — R296 Repeated falls: Secondary | ICD-10-CM | POA: Diagnosis not present

## 2015-06-30 DIAGNOSIS — I480 Paroxysmal atrial fibrillation: Secondary | ICD-10-CM | POA: Diagnosis not present

## 2015-07-01 DIAGNOSIS — M6281 Muscle weakness (generalized): Secondary | ICD-10-CM | POA: Diagnosis not present

## 2015-07-01 DIAGNOSIS — R293 Abnormal posture: Secondary | ICD-10-CM | POA: Diagnosis not present

## 2015-07-01 DIAGNOSIS — R29818 Other symptoms and signs involving the nervous system: Secondary | ICD-10-CM | POA: Diagnosis not present

## 2015-07-01 DIAGNOSIS — R262 Difficulty in walking, not elsewhere classified: Secondary | ICD-10-CM | POA: Diagnosis not present

## 2015-07-01 DIAGNOSIS — R296 Repeated falls: Secondary | ICD-10-CM | POA: Diagnosis not present

## 2015-07-04 ENCOUNTER — Telehealth: Payer: Self-pay | Admitting: Neurology

## 2015-07-04 DIAGNOSIS — R293 Abnormal posture: Secondary | ICD-10-CM | POA: Diagnosis not present

## 2015-07-04 DIAGNOSIS — R296 Repeated falls: Secondary | ICD-10-CM | POA: Diagnosis not present

## 2015-07-04 DIAGNOSIS — R262 Difficulty in walking, not elsewhere classified: Secondary | ICD-10-CM | POA: Diagnosis not present

## 2015-07-04 DIAGNOSIS — R29818 Other symptoms and signs involving the nervous system: Secondary | ICD-10-CM | POA: Diagnosis not present

## 2015-07-04 DIAGNOSIS — M6281 Muscle weakness (generalized): Secondary | ICD-10-CM | POA: Diagnosis not present

## 2015-07-04 NOTE — Telephone Encounter (Signed)
Eliane Decree [phone] called to get clarification on his recommendations regarding ambulating, discrepancy in the past with his seizure activity and whether he is able to walk with therapy and transition with staff.

## 2015-07-05 DIAGNOSIS — M6281 Muscle weakness (generalized): Secondary | ICD-10-CM | POA: Diagnosis not present

## 2015-07-05 DIAGNOSIS — R262 Difficulty in walking, not elsewhere classified: Secondary | ICD-10-CM | POA: Diagnosis not present

## 2015-07-05 DIAGNOSIS — R296 Repeated falls: Secondary | ICD-10-CM | POA: Diagnosis not present

## 2015-07-05 DIAGNOSIS — R293 Abnormal posture: Secondary | ICD-10-CM | POA: Diagnosis not present

## 2015-07-05 DIAGNOSIS — R29818 Other symptoms and signs involving the nervous system: Secondary | ICD-10-CM | POA: Diagnosis not present

## 2015-07-05 NOTE — Telephone Encounter (Signed)
I called concerning the patient. Okay to ambulate as long as someone is with them, they plan on using a PT belt, and a wheelchair behind him. This should minimize the risk of any falls.

## 2015-07-06 DIAGNOSIS — R262 Difficulty in walking, not elsewhere classified: Secondary | ICD-10-CM | POA: Diagnosis not present

## 2015-07-06 DIAGNOSIS — M6281 Muscle weakness (generalized): Secondary | ICD-10-CM | POA: Diagnosis not present

## 2015-07-06 DIAGNOSIS — R293 Abnormal posture: Secondary | ICD-10-CM | POA: Diagnosis not present

## 2015-07-06 DIAGNOSIS — R29818 Other symptoms and signs involving the nervous system: Secondary | ICD-10-CM | POA: Diagnosis not present

## 2015-07-06 DIAGNOSIS — R296 Repeated falls: Secondary | ICD-10-CM | POA: Diagnosis not present

## 2015-07-11 DIAGNOSIS — M6281 Muscle weakness (generalized): Secondary | ICD-10-CM | POA: Diagnosis not present

## 2015-07-11 DIAGNOSIS — R29818 Other symptoms and signs involving the nervous system: Secondary | ICD-10-CM | POA: Diagnosis not present

## 2015-07-11 DIAGNOSIS — R293 Abnormal posture: Secondary | ICD-10-CM | POA: Diagnosis not present

## 2015-07-11 DIAGNOSIS — R296 Repeated falls: Secondary | ICD-10-CM | POA: Diagnosis not present

## 2015-07-11 DIAGNOSIS — R262 Difficulty in walking, not elsewhere classified: Secondary | ICD-10-CM | POA: Diagnosis not present

## 2015-07-12 DIAGNOSIS — R293 Abnormal posture: Secondary | ICD-10-CM | POA: Diagnosis not present

## 2015-07-12 DIAGNOSIS — R29818 Other symptoms and signs involving the nervous system: Secondary | ICD-10-CM | POA: Diagnosis not present

## 2015-07-12 DIAGNOSIS — H6121 Impacted cerumen, right ear: Secondary | ICD-10-CM | POA: Diagnosis not present

## 2015-07-12 DIAGNOSIS — R262 Difficulty in walking, not elsewhere classified: Secondary | ICD-10-CM | POA: Diagnosis not present

## 2015-07-12 DIAGNOSIS — R296 Repeated falls: Secondary | ICD-10-CM | POA: Diagnosis not present

## 2015-07-12 DIAGNOSIS — M6281 Muscle weakness (generalized): Secondary | ICD-10-CM | POA: Diagnosis not present

## 2015-07-12 DIAGNOSIS — H6061 Unspecified chronic otitis externa, right ear: Secondary | ICD-10-CM | POA: Diagnosis not present

## 2015-07-13 DIAGNOSIS — R29818 Other symptoms and signs involving the nervous system: Secondary | ICD-10-CM | POA: Diagnosis not present

## 2015-07-13 DIAGNOSIS — R262 Difficulty in walking, not elsewhere classified: Secondary | ICD-10-CM | POA: Diagnosis not present

## 2015-07-13 DIAGNOSIS — R296 Repeated falls: Secondary | ICD-10-CM | POA: Diagnosis not present

## 2015-07-13 DIAGNOSIS — R293 Abnormal posture: Secondary | ICD-10-CM | POA: Diagnosis not present

## 2015-07-13 DIAGNOSIS — M6281 Muscle weakness (generalized): Secondary | ICD-10-CM | POA: Diagnosis not present

## 2015-07-15 DIAGNOSIS — R293 Abnormal posture: Secondary | ICD-10-CM | POA: Diagnosis not present

## 2015-07-15 DIAGNOSIS — R29818 Other symptoms and signs involving the nervous system: Secondary | ICD-10-CM | POA: Diagnosis not present

## 2015-07-15 DIAGNOSIS — R296 Repeated falls: Secondary | ICD-10-CM | POA: Diagnosis not present

## 2015-07-15 DIAGNOSIS — M6281 Muscle weakness (generalized): Secondary | ICD-10-CM | POA: Diagnosis not present

## 2015-07-15 DIAGNOSIS — R262 Difficulty in walking, not elsewhere classified: Secondary | ICD-10-CM | POA: Diagnosis not present

## 2015-07-18 DIAGNOSIS — R293 Abnormal posture: Secondary | ICD-10-CM | POA: Diagnosis not present

## 2015-07-18 DIAGNOSIS — R29818 Other symptoms and signs involving the nervous system: Secondary | ICD-10-CM | POA: Diagnosis not present

## 2015-07-18 DIAGNOSIS — M6281 Muscle weakness (generalized): Secondary | ICD-10-CM | POA: Diagnosis not present

## 2015-07-18 DIAGNOSIS — R262 Difficulty in walking, not elsewhere classified: Secondary | ICD-10-CM | POA: Diagnosis not present

## 2015-07-18 DIAGNOSIS — R296 Repeated falls: Secondary | ICD-10-CM | POA: Diagnosis not present

## 2015-07-19 DIAGNOSIS — R262 Difficulty in walking, not elsewhere classified: Secondary | ICD-10-CM | POA: Diagnosis not present

## 2015-07-19 DIAGNOSIS — R293 Abnormal posture: Secondary | ICD-10-CM | POA: Diagnosis not present

## 2015-07-19 DIAGNOSIS — R29818 Other symptoms and signs involving the nervous system: Secondary | ICD-10-CM | POA: Diagnosis not present

## 2015-07-19 DIAGNOSIS — R296 Repeated falls: Secondary | ICD-10-CM | POA: Diagnosis not present

## 2015-07-19 DIAGNOSIS — M6281 Muscle weakness (generalized): Secondary | ICD-10-CM | POA: Diagnosis not present

## 2015-07-20 DIAGNOSIS — R262 Difficulty in walking, not elsewhere classified: Secondary | ICD-10-CM | POA: Diagnosis not present

## 2015-07-20 DIAGNOSIS — R29818 Other symptoms and signs involving the nervous system: Secondary | ICD-10-CM | POA: Diagnosis not present

## 2015-07-20 DIAGNOSIS — M6281 Muscle weakness (generalized): Secondary | ICD-10-CM | POA: Diagnosis not present

## 2015-07-20 DIAGNOSIS — R293 Abnormal posture: Secondary | ICD-10-CM | POA: Diagnosis not present

## 2015-07-20 DIAGNOSIS — R296 Repeated falls: Secondary | ICD-10-CM | POA: Diagnosis not present

## 2015-07-22 DIAGNOSIS — M6281 Muscle weakness (generalized): Secondary | ICD-10-CM | POA: Diagnosis not present

## 2015-07-22 DIAGNOSIS — R293 Abnormal posture: Secondary | ICD-10-CM | POA: Diagnosis not present

## 2015-07-22 DIAGNOSIS — R296 Repeated falls: Secondary | ICD-10-CM | POA: Diagnosis not present

## 2015-07-22 DIAGNOSIS — R262 Difficulty in walking, not elsewhere classified: Secondary | ICD-10-CM | POA: Diagnosis not present

## 2015-07-22 DIAGNOSIS — R29818 Other symptoms and signs involving the nervous system: Secondary | ICD-10-CM | POA: Diagnosis not present

## 2015-07-25 DIAGNOSIS — R296 Repeated falls: Secondary | ICD-10-CM | POA: Diagnosis not present

## 2015-07-25 DIAGNOSIS — R293 Abnormal posture: Secondary | ICD-10-CM | POA: Diagnosis not present

## 2015-07-25 DIAGNOSIS — M6281 Muscle weakness (generalized): Secondary | ICD-10-CM | POA: Diagnosis not present

## 2015-07-25 DIAGNOSIS — R29818 Other symptoms and signs involving the nervous system: Secondary | ICD-10-CM | POA: Diagnosis not present

## 2015-07-25 DIAGNOSIS — R262 Difficulty in walking, not elsewhere classified: Secondary | ICD-10-CM | POA: Diagnosis not present

## 2015-07-26 DIAGNOSIS — R29818 Other symptoms and signs involving the nervous system: Secondary | ICD-10-CM | POA: Diagnosis not present

## 2015-07-26 DIAGNOSIS — H903 Sensorineural hearing loss, bilateral: Secondary | ICD-10-CM | POA: Diagnosis not present

## 2015-07-26 DIAGNOSIS — M6281 Muscle weakness (generalized): Secondary | ICD-10-CM | POA: Diagnosis not present

## 2015-07-26 DIAGNOSIS — H6121 Impacted cerumen, right ear: Secondary | ICD-10-CM | POA: Diagnosis not present

## 2015-07-26 DIAGNOSIS — R296 Repeated falls: Secondary | ICD-10-CM | POA: Diagnosis not present

## 2015-07-26 DIAGNOSIS — R293 Abnormal posture: Secondary | ICD-10-CM | POA: Diagnosis not present

## 2015-07-26 DIAGNOSIS — R262 Difficulty in walking, not elsewhere classified: Secondary | ICD-10-CM | POA: Diagnosis not present

## 2015-07-27 DIAGNOSIS — R262 Difficulty in walking, not elsewhere classified: Secondary | ICD-10-CM | POA: Diagnosis not present

## 2015-07-27 DIAGNOSIS — R293 Abnormal posture: Secondary | ICD-10-CM | POA: Diagnosis not present

## 2015-07-27 DIAGNOSIS — M6281 Muscle weakness (generalized): Secondary | ICD-10-CM | POA: Diagnosis not present

## 2015-07-27 DIAGNOSIS — R296 Repeated falls: Secondary | ICD-10-CM | POA: Diagnosis not present

## 2015-07-27 DIAGNOSIS — R29818 Other symptoms and signs involving the nervous system: Secondary | ICD-10-CM | POA: Diagnosis not present

## 2015-07-28 DIAGNOSIS — R293 Abnormal posture: Secondary | ICD-10-CM | POA: Diagnosis not present

## 2015-07-28 DIAGNOSIS — R296 Repeated falls: Secondary | ICD-10-CM | POA: Diagnosis not present

## 2015-07-28 DIAGNOSIS — R262 Difficulty in walking, not elsewhere classified: Secondary | ICD-10-CM | POA: Diagnosis not present

## 2015-07-28 DIAGNOSIS — R29818 Other symptoms and signs involving the nervous system: Secondary | ICD-10-CM | POA: Diagnosis not present

## 2015-07-28 DIAGNOSIS — M6281 Muscle weakness (generalized): Secondary | ICD-10-CM | POA: Diagnosis not present

## 2015-08-30 DIAGNOSIS — R2681 Unsteadiness on feet: Secondary | ICD-10-CM | POA: Diagnosis not present

## 2015-08-30 DIAGNOSIS — R293 Abnormal posture: Secondary | ICD-10-CM | POA: Diagnosis not present

## 2015-08-30 DIAGNOSIS — R29818 Other symptoms and signs involving the nervous system: Secondary | ICD-10-CM | POA: Diagnosis not present

## 2015-08-30 DIAGNOSIS — M6281 Muscle weakness (generalized): Secondary | ICD-10-CM | POA: Diagnosis not present

## 2015-08-31 ENCOUNTER — Ambulatory Visit: Payer: Self-pay | Admitting: Neurology

## 2015-09-01 DIAGNOSIS — R2681 Unsteadiness on feet: Secondary | ICD-10-CM | POA: Diagnosis not present

## 2015-09-01 DIAGNOSIS — R293 Abnormal posture: Secondary | ICD-10-CM | POA: Diagnosis not present

## 2015-09-01 DIAGNOSIS — M6281 Muscle weakness (generalized): Secondary | ICD-10-CM | POA: Diagnosis not present

## 2015-09-01 DIAGNOSIS — R29818 Other symptoms and signs involving the nervous system: Secondary | ICD-10-CM | POA: Diagnosis not present

## 2015-09-13 DIAGNOSIS — M6281 Muscle weakness (generalized): Secondary | ICD-10-CM | POA: Diagnosis not present

## 2015-09-13 DIAGNOSIS — R2681 Unsteadiness on feet: Secondary | ICD-10-CM | POA: Diagnosis not present

## 2015-09-13 DIAGNOSIS — R293 Abnormal posture: Secondary | ICD-10-CM | POA: Diagnosis not present

## 2015-09-13 DIAGNOSIS — R29818 Other symptoms and signs involving the nervous system: Secondary | ICD-10-CM | POA: Diagnosis not present

## 2015-09-16 DIAGNOSIS — M6281 Muscle weakness (generalized): Secondary | ICD-10-CM | POA: Diagnosis not present

## 2015-09-16 DIAGNOSIS — R2681 Unsteadiness on feet: Secondary | ICD-10-CM | POA: Diagnosis not present

## 2015-09-16 DIAGNOSIS — R29818 Other symptoms and signs involving the nervous system: Secondary | ICD-10-CM | POA: Diagnosis not present

## 2015-09-16 DIAGNOSIS — R293 Abnormal posture: Secondary | ICD-10-CM | POA: Diagnosis not present

## 2015-09-27 ENCOUNTER — Encounter: Payer: Self-pay | Admitting: Neurology

## 2015-09-27 ENCOUNTER — Ambulatory Visit (INDEPENDENT_AMBULATORY_CARE_PROVIDER_SITE_OTHER): Payer: Medicare Other | Admitting: Neurology

## 2015-09-27 VITALS — BP 117/63 | HR 91 | Ht 75.0 in | Wt 210.0 lb

## 2015-09-27 DIAGNOSIS — R251 Tremor, unspecified: Secondary | ICD-10-CM | POA: Diagnosis not present

## 2015-09-27 DIAGNOSIS — R269 Unspecified abnormalities of gait and mobility: Secondary | ICD-10-CM | POA: Diagnosis not present

## 2015-09-27 HISTORY — DX: Tremor, unspecified: R25.1

## 2015-09-27 MED ORDER — LAMOTRIGINE 25 MG PO TABS: ORAL_TABLET | ORAL | Status: DC

## 2015-09-27 NOTE — Patient Instructions (Addendum)
We will taper down on the Keppra, go to 1 in the morning and 1. 5 in the evening for 2 weeks, then go to half of a tablet in the morning, 1 tablet at night for 2 weeks, then take 1 tablet at night for 2 weeks, then stop.  We will begin going on Lamictal, follow directions on the prescription bottle. Once he is on 75 mg twice daily for 2 weeks, contact our office, I will call in the 100 mg tablets to take 1 twice daily.   Epilepsy Epilepsy is a disorder in which a person has repeated seizures over time. A seizure is a release of abnormal electrical activity in the brain. Seizures can cause a change in attention, behavior, or the ability to remain awake and alert (altered mental status). Seizures often involve uncontrollable shaking (convulsions).  Most people with epilepsy lead normal lives. However, people with epilepsy are at an increased risk of falls, accidents, and injuries. Therefore, it is important to begin treatment right away. CAUSES  Epilepsy has many possible causes. Anything that disturbs the normal pattern of brain cell activity can lead to seizures. This may include:   Head injury.  Birth trauma.  High fever as a child.  Stroke.  Bleeding into or around the brain.  Certain drugs.  Prolonged low oxygen, such as what occurs after CPR efforts.  Abnormal brain development.  Certain illnesses, such as meningitis, encephalitis (brain infection), malaria, and other infections.  An imbalance of nerve signaling chemicals (neurotransmitters).  SIGNS AND SYMPTOMS  The symptoms of a seizure can vary greatly from one person to another. Right before a seizure, you may have a warning (aura) that a seizure is about to occur. An aura may include the following symptoms:  Fear or anxiety.  Nausea.  Feeling like the room is spinning (vertigo).  Vision changes, such as seeing flashing lights or spots. Common symptoms during a seizure include:  Abnormal sensations, such as an  abnormal smell or a bitter taste in the mouth.   Sudden, general body stiffness.   Convulsions that involve rhythmic jerking of the face, arm, or leg on one or both sides.   Sudden change in consciousness.   Appearing to be awake but not responding.   Appearing to be asleep but cannot be awakened.   Grimacing, chewing, lip smacking, drooling, tongue biting, or loss of bowel or bladder control. After a seizure, you may feel sleepy for a while. DIAGNOSIS  Your health care provider will ask about your symptoms and take a medical history. Descriptions from any witnesses to your seizures will be very helpful in the diagnosis. A physical exam, including a detailed neurological exam, is necessary. Various tests may be done, such as:   An electroencephalogram (EEG). This is a painless test of your brain waves. In this test, a diagram is created of your brain waves. These diagrams can be interpreted by a specialist.  An MRI of the brain.   A CT scan of the brain.   A spinal tap (lumbar puncture, LP).  Blood tests to check for signs of infection or abnormal blood chemistry. TREATMENT  There is no cure for epilepsy, but it is generally treatable. Once epilepsy is diagnosed, it is important to begin treatment as soon as possible. For most people with epilepsy, seizures can be controlled with medicines. The following may also be used:  A pacemaker for the brain (vagus nerve stimulator) can be used for people with seizures that are  not well controlled by medicine.  Surgery on the brain. For some people, epilepsy eventually goes away. HOME CARE INSTRUCTIONS   Follow your health care provider's recommendations on driving and safety in normal activities.  Get enough rest. Lack of sleep can cause seizures.  Only take over-the-counter or prescription medicines as directed by your health care provider. Take any prescribed medicine exactly as directed.  Avoid any known triggers of your  seizures.  Keep a seizure diary. Record what you recall about any seizure, especially any possible trigger.   Make sure the people you live and work with know that you are prone to seizures. They should receive instructions on how to help you. In general, a witness to a seizure should:   Cushion your head and body.   Turn you on your side.   Avoid unnecessarily restraining you.   Not place anything inside your mouth.   Call for emergency medical help if there is any question about what has occurred.   Follow up with your health care provider as directed. You may need regular blood tests to monitor the levels of your medicine.  SEEK MEDICAL CARE IF:   You develop signs of infection or other illness. This might increase the risk of a seizure.   You seem to be having more frequent seizures.   Your seizure pattern is changing.  SEEK IMMEDIATE MEDICAL CARE IF:   You have a seizure that does not stop after a few moments.   You have a seizure that causes any difficulty in breathing.   You have a seizure that results in a very severe headache.   You have a seizure that leaves you with the inability to speak or use a part of your body.    This information is not intended to replace advice given to you by your health care provider. Make sure you discuss any questions you have with your health care provider.   Document Released: 08/13/2005 Document Revised: 06/03/2013 Document Reviewed: 03/25/2013 [employer] Interactive Patient Education [employer].

## 2015-09-27 NOTE — Progress Notes (Signed)
Reason for visit: Seizures  Barry Dean is an 82 y.o. male  History of present illness:  Mr. Barry Dean is an 82 year old left-handed white male with a history of problems with a significant gait disorder with a tendency to fall backwards. The patient has episodes of laughing, then staring off felt secondary to seizures. The patient is been on Vimpat and Keppra. The patient has had significant drowsiness on this medication. The patient has had episodes of syncope that occurred during the summer 2016, the patient may have had a fall from bed that was unwitnessed on 08/22/2015. The patient has had no definite episodes of syncope that occurred since last seen. The patient is quite drowsy on the medication however, he sleeps most of the day. He has had brief episodes where he may vocalize, no significant problems with staring or unresponsiveness is noted. The patient returns to the office today for an evaluation.  Past Medical History  Diagnosis Date  . Hypertension   . Seizures ([place])   . A-fib ([place])   . Dehydration   . Deafness   . Anxiety   . Reflux   . Arthritis   . Depression   . Atrial fibrillation ([place])   . Tremor, essential   . Seizure ([place])   . BPH (benign prostatic hyperplasia)   . Syncope   . Deaf   . Melanoma ([place])   . GERD (gastroesophageal reflux disease)   . Mitral regurgitation   . Abnormality of gait   . Tremor 09/27/2015    Past Surgical History  Procedure Laterality Date  . Unknown    . Appendectomy    . Cataract extraction w/ intraocular lens  implant, bilateral    . Esophagogastroduodenoscopy (egd) with propofol N/A 09/15/2014    Procedure: ESOPHAGOGASTRODUODENOSCOPY (EGD) WITH PROPOFOL;  Surgeon: [name], MD;  Location: [facility] ENDOSCOPY;  Service: Endoscopy;  Laterality: N/A;    Family History  Problem Relation Age of Onset  . Cancer Mother     Skin cancer  . Stroke Father   . Parkinsonism Sister   . Diabetes Brother     Social history:   reports that he has never smoked. He has never used smokeless tobacco. He reports that he does not drink alcohol or use illicit drugs.    Allergies  Allergen Reactions  . Topamax Other (See Comments)    Unknown reaction  . Uroxatral [Alfuzosin Hydrochloride] Other (See Comments)    Unknown reaction  . Valium Other (See Comments)    Unknown reaction    Medications:  Prior to Admission medications   Medication Sig Start Date End Date Taking? Authorizing Provider  acetaminophen (TYLENOL) 500 MG tablet Take 500 mg by mouth every 6 (six) hours as needed for pain. Not to exceed 3 gms of acetaminophen in 24 hours   Yes Historical Provider, MD  amLODipine-benazepril (LOTREL) 5-20 MG per capsule Take 1 capsule by mouth daily.   Yes Historical Provider, MD  aspirin 81 MG chewable tablet Chew 1 tablet (81 mg total) by mouth every evening. 09/22/14  Yes Thurnell Lose, MD  HYDROcodone-acetaminophen (NORCO/VICODIN) 5-325 MG per tablet Take 1 tablet by mouth every 8 (eight) hours as needed for moderate pain.   Yes Historical Provider, MD  iron polysaccharides (NIFEREX) 150 MG capsule Take 150 mg by mouth 2 (two) times daily.    Yes Historical Provider, MD  lacosamide (VIMPAT) 50 MG TABS tablet Take 1 tablet in the AM (50 mg) and 4 tablets (  200 mg ) in the PM. Patient taking differently: Take 50-200 mg by mouth 2 (two) times daily. Take 1 tablet in the AM (50 mg) and 2 tablets (200 mg ) in the PM. 10/14/14  Yes Ward Givens, NP  levETIRAcetam (KEPPRA) 500 MG tablet 1.5 tablets in the morning and 2 tablets in the evening 04/19/15  Yes Kathrynn Ducking, MD  loperamide (IMODIUM A-D) 2 MG tablet Take 2 mg by mouth 4 (four) times daily as needed for diarrhea or loose stools.   Yes Historical Provider, MD  ondansetron (ZOFRAN) 4 MG tablet Take 1 tablet (4 mg total) by mouth every 8 (eight) hours as needed for nausea or vomiting. 09/17/14  Yes Thurnell Lose, MD  pantoprazole (PROTONIX) 40 MG tablet Take 1  tablet (40 mg total) by mouth 2 (two) times daily. Patient taking differently: Take 40 mg by mouth daily.  09/17/14  Yes Thurnell Lose, MD  polyethylene glycol (MIRALAX / GLYCOLAX) packet Take 17 g by mouth daily.   Yes Historical Provider, MD  sennosides-docusate sodium (SENOKOT-S) 8.6-50 MG tablet Take 1 tablet by mouth at bedtime as needed for constipation.   Yes Historical Provider, MD  vitamin B-12 (CYANOCOBALAMIN) 1000 MCG tablet Take 2,000 mcg by mouth daily.   Yes Historical Provider, MD    ROS:  Out of a complete 14 system review of symptoms, the patient complains only of the following symptoms, and all other reviewed systems are negative.  Hearing loss, drooling Black stools, incontinence of bowel Seizures, weakness Confusion  Blood pressure 117/63, pulse 91, height 6\' 3"  (1.905 m), weight 210 lb (95.255 kg).  Physical Exam  General: The patient is alert and cooperative at the time of the examination. At times, the patient will drift off to sleep.  Skin: 1+ edema at the ankles is noted bilaterally.   Neurologic Exam  Mental status: The patient is alert and oriented x 3 at the time of the examination. The patient will frequently difficult sleep during the clinical examination.   Cranial nerves: Facial symmetry is present. Speech is normal, no aphasia or dysarthria is noted. Extraocular movements are full. Visual fields are full.  Motor: The patient has good strength in all 4 extremities.  Sensory examination: Soft touch sensation is symmetric on the face, arms, and legs.  Coordination: The patient has good finger-nose-finger and heel-to-shin bilaterally. The patient has an intention tremor with both upper extremities. Some resting component to the tremor is also seen.  Gait and station: The patient will stand with some assistance. Once up, the patient has a very definite tendency to lean backwards, he cannot ambulate independently.  Reflexes: Deep tendon reflexes  are symmetric, but are depressed.   Assessment/Plan:  1. History of seizures  2. Gait disorder  3. Tremor  The patient has had multiple episodes of what the family is interpreting as seizures. The patient will have brief minimal vocalizations, but no period of unresponsiveness is noted with this. I am not clear that these are seizure events. The patient is very drowsy on the [place], we will need to taper him off of this medication and use another anticonvulsant medication such as Lamictal. Will write a prescription for Lamictal and go gradually up on the dose. The Keppra will be tapered over the next several weeks. He will follow-up in 4 months.  Jill Alexanders MD 09/27/2015 6:40 PM  [facility] [address] [place], [place] [address]  Phone [phone] Fax [phone]

## 2015-09-29 ENCOUNTER — Ambulatory Visit: Payer: Medicare Other | Admitting: Neurology

## 2015-11-01 DIAGNOSIS — I10 Essential (primary) hypertension: Secondary | ICD-10-CM | POA: Diagnosis not present

## 2015-11-01 DIAGNOSIS — G40909 Epilepsy, unspecified, not intractable, without status epilepticus: Secondary | ICD-10-CM | POA: Diagnosis not present

## 2015-11-01 DIAGNOSIS — E222 Syndrome of inappropriate secretion of antidiuretic hormone: Secondary | ICD-10-CM | POA: Diagnosis not present

## 2015-11-01 DIAGNOSIS — H9190 Unspecified hearing loss, unspecified ear: Secondary | ICD-10-CM | POA: Diagnosis not present

## 2015-11-01 DIAGNOSIS — E611 Iron deficiency: Secondary | ICD-10-CM | POA: Diagnosis not present

## 2016-01-31 ENCOUNTER — Encounter: Payer: Self-pay | Admitting: Adult Health

## 2016-01-31 ENCOUNTER — Encounter: Payer: Self-pay | Admitting: *Deleted

## 2016-01-31 ENCOUNTER — Telehealth: Payer: Self-pay | Admitting: Adult Health

## 2016-01-31 ENCOUNTER — Ambulatory Visit (INDEPENDENT_AMBULATORY_CARE_PROVIDER_SITE_OTHER): Payer: Medicare Other | Admitting: Adult Health

## 2016-01-31 VITALS — BP 120/66 | HR 65 | Ht 75.0 in | Wt 210.2 lb

## 2016-01-31 DIAGNOSIS — R569 Unspecified convulsions: Secondary | ICD-10-CM | POA: Diagnosis not present

## 2016-01-31 DIAGNOSIS — Z5181 Encounter for therapeutic drug level monitoring: Secondary | ICD-10-CM | POA: Diagnosis not present

## 2016-01-31 MED ORDER — LAMOTRIGINE 25 MG PO TABS
50.0000 mg | ORAL_TABLET | Freq: Two times a day (BID) | ORAL | Status: DC
Start: 2016-01-31 — End: 2016-02-06

## 2016-01-31 MED ORDER — LACOSAMIDE 50 MG PO TABS: ORAL_TABLET | ORAL | Status: DC

## 2016-01-31 NOTE — Progress Notes (Signed)
I have read the note, and I agree with the clinical assessment and plan.  Barry Dean,Barry Dean   

## 2016-01-31 NOTE — Telephone Encounter (Signed)
Levada Dy with [facility] is calling in regard to patient's Rx lamictal 25 mg tablets.  She states the new Rx says to increase his Lamictal to 2 tablets 2 times per day but she states he was already taking 3 tablets 2 times per day.  Please clarify dosage by calling Levada Dy @[phone].

## 2016-01-31 NOTE — Patient Instructions (Addendum)
Increase lamictal to 50 mg twice a day Vimpat 50 mg in the morning and 150 mg in the evening Blood work today If your symptoms worsen or you develop new symptoms please let us know.

## 2016-01-31 NOTE — Progress Notes (Signed)
PATIENT: Barry Dean DOB: 09-Feb-1924  REASON FOR VISIT: follow up- gait disorder, seizures HISTORY FROM: patient  HISTORY OF PRESENT ILLNESS: Barry Dean is an 82 year old male with a history of gait disorder and seizures. He returns today for follow-up. The patient's daughter is with him today. She states that his seizure frequency has definitely decreased since the last visit. She notes that his last seizure was on Sunday. She describes it as a laughing episode tjat lasted for a few seconds. She states that she is not with him every day and the facility has not made her aware of any additional seizures. She reports that his sleepiness has improved since stopping Keppra. He still has days where he sleeps throughout the day. He does require assistance with ADLs. He is currently residing at [facility]. There has been some mixup with his medication. He has remained on Lamictal 25 mg twice a day instead of titrating this medication up. He is also on Vimpat 50 mg in the morning and 150 mg in the evening. He returns today for an evaluation.    HISTORY 09/27/15: Barry Dean is an 82 year old left-handed white male with a history of problems with a significant gait disorder with a tendency to fall backwards. The patient has episodes of laughing, then staring off felt secondary to seizures. The patient is been on Vimpat and Keppra. The patient has had significant drowsiness on this medication. The patient has had episodes of syncope that occurred during the summer 2016, the patient may have had a fall from bed that was unwitnessed on 08/22/2015. The patient has had no definite episodes of syncope that occurred since last seen. The patient is quite drowsy on the medication however, he sleeps most of the day. He has had brief episodes where he may vocalize, no significant problems with staring or unresponsiveness is noted. The patient returns to the office today for an evaluation  REVIEW OF SYSTEMS:  Out of a complete 14 system review of symptoms, the patient complains only of the following symptoms, and all other reviewed systems are negative.  Eye redness, hearing loss, drooling  ALLERGIES: Allergies  Allergen Reactions  . Topamax Other (See Comments)    Unknown reaction  . Uroxatral [Alfuzosin Hydrochloride] Other (See Comments)    Unknown reaction  . Valium Other (See Comments)    Unknown reaction    HOME MEDICATIONS: Outpatient Prescriptions Prior to Visit  Medication Sig Dispense Refill  . acetaminophen (TYLENOL) 500 MG tablet Take 500 mg by mouth every 6 (six) hours as needed for pain. Not to exceed 3 gms of acetaminophen in 24 hours    . amLODipine-benazepril (LOTREL) 5-20 MG per capsule Take 1 capsule by mouth daily.    Marland Kitchen aspirin 81 MG chewable tablet Chew 1 tablet (81 mg total) by mouth every evening.    Marland Kitchen HYDROcodone-acetaminophen (NORCO/VICODIN) 5-325 MG per tablet Take 1 tablet by mouth every 8 (eight) hours as needed for moderate pain.    . iron polysaccharides (NIFEREX) 150 MG capsule Take 150 mg by mouth 2 (two) times daily.     Marland Kitchen lacosamide (VIMPAT) 50 MG TABS tablet Take 1 tablet in the AM (50 mg) and 4 tablets (200 mg ) in the PM. (Patient taking differently: Take 50-200 mg by mouth 2 (two) times daily. Take 1 tablet in the AM (50 mg) and 2 tablets (200 mg ) in the PM.) 150 tablet 4  . lamoTRIgine (LAMICTAL) 25 MG tablet One tablet twice a  day for 2 weeks, the take 2 tablets twice a day for 2 weeks, then take 3 tablets twice a day 180 tablet 1  . pantoprazole (PROTONIX) 40 MG tablet Take 1 tablet (40 mg total) by mouth 2 (two) times daily. (Patient taking differently: Take 40 mg by mouth daily. ) 60 tablet 2  . sennosides-docusate sodium (SENOKOT-S) 8.6-50 MG tablet Take 1 tablet by mouth at bedtime as needed for constipation.    . vitamin B-12 (CYANOCOBALAMIN) 1000 MCG tablet Take 2,000 mcg by mouth daily.    Marland Kitchen levETIRAcetam (KEPPRA) 500 MG tablet 1.5 tablets in  the morning and 2 tablets in the evening (Patient taking differently: Take 500 mg by mouth 2 (two) times daily. ) 105 tablet 5  . loperamide (IMODIUM A-D) 2 MG tablet Take 2 mg by mouth 4 (four) times daily as needed for diarrhea or loose stools.    . ondansetron (ZOFRAN) 4 MG tablet Take 1 tablet (4 mg total) by mouth every 8 (eight) hours as needed for nausea or vomiting. 20 tablet 0  . polyethylene glycol (MIRALAX / GLYCOLAX) packet Take 17 g by mouth daily.     No facility-administered medications prior to visit.    PAST MEDICAL HISTORY: Past Medical History  Diagnosis Date  . Hypertension   . Seizures ([place])   . A-fib ([place])   . Dehydration   . Deafness   . Anxiety   . Reflux   . Arthritis   . Depression   . Atrial fibrillation ([place])   . Tremor, essential   . Seizure ([place])   . BPH (benign prostatic hyperplasia)   . Syncope   . Deaf   . Melanoma ([place])   . GERD (gastroesophageal reflux disease)   . Mitral regurgitation   . Abnormality of gait   . Tremor 09/27/2015    PAST SURGICAL HISTORY: Past Surgical History  Procedure Laterality Date  . Unknown    . Appendectomy    . Cataract extraction w/ intraocular lens  implant, bilateral    . Esophagogastroduodenoscopy (egd) with propofol N/A 09/15/2014    Procedure: ESOPHAGOGASTRODUODENOSCOPY (EGD) WITH PROPOFOL;  Surgeon: [name], MD;  Location: [facility] ENDOSCOPY;  Service: Endoscopy;  Laterality: N/A;    FAMILY HISTORY: Family History  Problem Relation Age of Onset  . Cancer Mother     Skin cancer  . Stroke Father   . Parkinsonism Sister   . Diabetes Brother     SOCIAL HISTORY: Social History   Social History  . Marital Status: Unknown    Spouse Name: N/A  . Number of Children: N/A  . Years of Education: N/A   Occupational History  . Not on file.   Social History Main Topics  . Smoking status: Never Smoker   . Smokeless tobacco: Never Used  . Alcohol Use: No  . Drug Use: No  . Sexual Activity: Not on  file   Other Topics Concern  . Not on file   Social History Narrative   ** Merged History Encounter **      Patient drinks about 2 cups of caffeine daily.   Patient is left handed.          PHYSICAL EXAM  Filed Vitals:   01/31/16 0959  BP: 120/66  Pulse: 65  Height: 6\' 3"  (1.905 m)  Weight: 210 lb 3.2 oz (95.346 kg)   Body mass index is 26.27 kg/(m^2).  Generalized: Well developed, in no acute distress   Neurological examination  Mentation:  Alert oriented to time, place, history taking. Follows all commands speech and language fluent Cranial nerve II-XII: Pupils were equal round reactive to light. Extraocular movements were full, visual field were full on confrontational test. Facial sensation and strength were normal. Uvula tongue midline. Head turning and shoulder shrug  were normal and symmetric. Motor: The motor testing reveals 5 over 5 strength of all 4 extremities. Good symmetric motor tone is noted throughout. Mild resting tremor in hands. Sensory: Sensory testing is intact to soft touch on all 4 extremities. No evidence of extinction is noted.  Coordination: Cerebellar testing reveals good finger-nose-finger and heel-to-shin bilaterally.  Gait and station: Patient is in a wheelchair.  Reflexes: Deep tendon reflexes are symmetric and normal bilaterally.   DIAGNOSTIC DATA (LABS, IMAGING, TESTING) - I reviewed patient records, labs, notes, testing and imaging myself where available.  Lab Results  Component Value Date   WBC 5.9 03/07/2015   HGB 12.1* 03/07/2015   HCT 35.3* 03/07/2015   MCV 87.2 03/07/2015   PLT 223 03/07/2015      Component Value Date/Time   NA 128* 03/07/2015 0320   K 3.8 03/07/2015 0320   CL 96* 03/07/2015 0320   CO2 27 03/07/2015 0320   GLUCOSE 89 03/07/2015 0320   BUN 9 03/07/2015 0320   CREATININE 0.91 03/07/2015 0320   CALCIUM 8.7* 03/07/2015 0320   PROT 6.2* 03/07/2015 0320   ALBUMIN 3.4* 03/07/2015 0320   AST 18 03/07/2015 0320     ALT 14* 03/07/2015 0320   ALKPHOS 69 03/07/2015 0320   BILITOT 0.6 03/07/2015 0320   GFRNONAA >60 03/07/2015 0320   GFRAA >60 03/07/2015 0320       ASSESSMENT AND PLAN 82 y.o. year old male  has a past medical history of Hypertension; Seizures ([place]); A-fib ([place]); Dehydration; Deafness; Anxiety; Reflux; Arthritis; Depression; Atrial fibrillation ([place]); Tremor, essential; Seizure ([place]); BPH (benign prostatic hyperplasia); Syncope; Deaf; Melanoma [facility]); GERD (gastroesophageal reflux disease); Mitral regurgitation; Abnormality of gait; and Tremor (09/27/2015). here with:  1. Seizures  Overall the patient is doing better in regards to his seizure frequency according to the daughter. He will continue on Vimpat 50 mg in the morning and 150 mg in the evening. I will increase Lamictal to 50 mg twice a day. I will check blood work today. Patient and his daughter advised that if his seizure frequency increases they should let us know. He will follow-up in 4-5 months or sooner if needed.     Ward Givens, MSN, NP-C 01/31/2016, 10:18 AM [facility] [address] [place], [place] [address] [phone]

## 2016-01-31 NOTE — Telephone Encounter (Signed)
Spoke to [facility] - they sent an outdated MAR with the patient to his appt with Megan on 01/30/16.  He is currently taking the following medications: 1) Vimpat - 50mg  in am, 150mg  in pm 2) Lamictal 75mg  BID Per vo by Jinny Blossom, give instructions to continue these medications at the current doses.

## 2016-02-01 LAB — CBC WITH DIFFERENTIAL/PLATELET
BASOS: 0 %
Basophils Absolute: 0 10*3/uL (ref 0.0–0.2)
EOS (ABSOLUTE): 0.1 10*3/uL (ref 0.0–0.4)
Eos: 2 %
Hematocrit: 39.3 % (ref 37.5–51.0)
Hemoglobin: 13.2 g/dL (ref 12.6–17.7)
IMMATURE GRANULOCYTES: 0 %
Immature Grans (Abs): 0 10*3/uL (ref 0.0–0.1)
Lymphocytes Absolute: 2.1 10*3/uL (ref 0.7–3.1)
Lymphs: 36 %
MCH: 30.5 pg (ref 26.6–33.0)
MCHC: 33.6 g/dL (ref 31.5–35.7)
MCV: 91 fL (ref 79–97)
Monocytes Absolute: 0.6 10*3/uL (ref 0.1–0.9)
Monocytes: 11 %
NEUTROS PCT: 51 %
Neutrophils Absolute: 3 10*3/uL (ref 1.4–7.0)
Platelets: 245 10*3/uL (ref 150–379)
RBC: 4.33 x10E6/uL (ref 4.14–5.80)
RDW: 14.1 % (ref 12.3–15.4)
WBC: 5.9 10*3/uL (ref 3.4–10.8)

## 2016-02-01 LAB — COMPREHENSIVE METABOLIC PANEL
A/G RATIO: 1.8 (ref 1.2–2.2)
ALK PHOS: 77 IU/L (ref 39–117)
ALT: 14 IU/L (ref 0–44)
AST: 20 IU/L (ref 0–40)
Albumin: 4.2 g/dL (ref 3.2–4.6)
BUN/Creatinine Ratio: 13 (ref 10–24)
BUN: 12 mg/dL (ref 10–36)
Bilirubin Total: 0.3 mg/dL (ref 0.0–1.2)
CO2: 21 mmol/L (ref 18–29)
Calcium: 9.3 mg/dL (ref 8.6–10.2)
Chloride: 94 mmol/L — ABNORMAL LOW (ref 96–106)
Creatinine, Ser: 0.93 mg/dL (ref 0.76–1.27)
GFR calc Af Amer: 83 mL/min/{1.73_m2} (ref >59)
GFR, EST NON AFRICAN AMERICAN: 72 mL/min/{1.73_m2} (ref >59)
GLOBULIN, TOTAL: 2.4 g/dL (ref 1.5–4.5)
Glucose: 90 mg/dL (ref 65–99)
POTASSIUM: 4.3 mmol/L (ref 3.5–5.2)
Sodium: 133 mmol/L — ABNORMAL LOW (ref 134–144)
Total Protein: 6.6 g/dL (ref 6.0–8.5)

## 2016-02-01 LAB — LAMOTRIGINE LEVEL: Lamotrigine Lvl: NOT DETECTED ug/mL (ref 2.0–20.0)

## 2016-02-02 NOTE — Telephone Encounter (Signed)
Spoke to Manuela Schwartz (his daughter on HIPPA) - she is aware of the information below and would like a call back after we speak to [name].

## 2016-02-02 NOTE — Telephone Encounter (Addendum)
[place] twice - left messages for a return call to discuss his medication.  The facility's nursing director is Almond Lint.

## 2016-02-02 NOTE — Telephone Encounter (Signed)
Patient's blood work indicated that he is not taking lamictal. There was "none detected." Can you call the facility- speak with the nursing director. Appears that he isn't receiving his medication or they are not watching him take it? Also please make the family on DPR aware. If he hasn't been taking for several days then we will need to titrate back up. lamictal 25 mg BID for 2 weeks, then 50 mg BID for 2 weeks then 75 mg BID thereafter.

## 2016-02-03 NOTE — Telephone Encounter (Addendum)
Called facility - unable to speak with any nurse again - left another message for a return call.  Almond Lint, the nursing director, was off work today.

## 2016-02-06 MED ORDER — LAMOTRIGINE 25 MG PO TABS: ORAL_TABLET | ORAL | Status: DC

## 2016-02-06 NOTE — Addendum Note (Signed)
Addended by: Noberto Retort C on: 02/06/2016 12:32 PM   Modules accepted: Orders

## 2016-02-06 NOTE — Telephone Encounter (Signed)
Called Barry Dean this morning - spoke to Barry Dean who was unable to answer questions concerning Barry Dean's medication.  I have asked for Almond Lint (nursing director) to return my call, mentioning to her that I have already left messages and have not heard back from her yet.

## 2016-02-06 NOTE — Telephone Encounter (Addendum)
Spoke to [employer] (Retail buyer) - she was unable to confirm patient's Lamictal dosage - stated she saw 25mg , BID and 75mg , BID.  She was not able to explain why he had not been getting his medication either.  Due to this confusion, she would like the new rx for his titrating dose to be faxed to her directly at [phone].  She will make sure the patient is restarted correctly on this medication.  Also, called his daughter, Manuela Schwartz, to make her aware.

## 2016-02-06 NOTE — Telephone Encounter (Signed)
Rx printed, signed and faxed per Megan's orders below.

## 2016-02-21 ENCOUNTER — Ambulatory Visit: Payer: Medicare Other | Admitting: Neurology

## 2016-03-12 ENCOUNTER — Inpatient Hospital Stay (HOSPITAL_COMMUNITY)
Admission: EM | Admit: 2016-03-12 | Discharge: 2016-03-23 | DRG: 391 | Disposition: A | Discharge status: Home Health | Payer: Medicare Other | Attending: Internal Medicine | Admitting: Internal Medicine

## 2016-03-12 ENCOUNTER — Emergency Department (HOSPITAL_COMMUNITY): Payer: Medicare Other

## 2016-03-12 ENCOUNTER — Encounter (HOSPITAL_COMMUNITY): Payer: Self-pay | Admitting: Emergency Medicine

## 2016-03-12 DIAGNOSIS — E871 Hypo-osmolality and hyponatremia: Secondary | ICD-10-CM | POA: Diagnosis present

## 2016-03-12 DIAGNOSIS — Z961 Presence of intraocular lens: Secondary | ICD-10-CM | POA: Diagnosis present

## 2016-03-12 DIAGNOSIS — Z66 Do not resuscitate: Secondary | ICD-10-CM | POA: Diagnosis present

## 2016-03-12 DIAGNOSIS — I5033 Acute on chronic diastolic (congestive) heart failure: Secondary | ICD-10-CM | POA: Diagnosis not present

## 2016-03-12 DIAGNOSIS — Z79891 Long term (current) use of opiate analgesic: Secondary | ICD-10-CM

## 2016-03-12 DIAGNOSIS — G9340 Encephalopathy, unspecified: Secondary | ICD-10-CM | POA: Diagnosis present

## 2016-03-12 DIAGNOSIS — G20 Parkinson's disease: Secondary | ICD-10-CM | POA: Diagnosis present

## 2016-03-12 DIAGNOSIS — E861 Hypovolemia: Secondary | ICD-10-CM | POA: Diagnosis present

## 2016-03-12 DIAGNOSIS — Y95 Nosocomial condition: Secondary | ICD-10-CM | POA: Diagnosis present

## 2016-03-12 DIAGNOSIS — Z888 Allergy status to other drugs, medicaments and biological substances status: Secondary | ICD-10-CM | POA: Diagnosis not present

## 2016-03-12 DIAGNOSIS — E860 Dehydration: Secondary | ICD-10-CM | POA: Diagnosis present

## 2016-03-12 DIAGNOSIS — N400 Enlarged prostate without lower urinary tract symptoms: Secondary | ICD-10-CM | POA: Diagnosis present

## 2016-03-12 DIAGNOSIS — I480 Paroxysmal atrial fibrillation: Secondary | ICD-10-CM | POA: Diagnosis present

## 2016-03-12 DIAGNOSIS — R6510 Systemic inflammatory response syndrome (SIRS) of non-infectious origin without acute organ dysfunction: Secondary | ICD-10-CM | POA: Diagnosis present

## 2016-03-12 DIAGNOSIS — K219 Gastro-esophageal reflux disease without esophagitis: Secondary | ICD-10-CM | POA: Diagnosis present

## 2016-03-12 DIAGNOSIS — J811 Chronic pulmonary edema: Secondary | ICD-10-CM

## 2016-03-12 DIAGNOSIS — R402212 Coma scale, best verbal response, none, at arrival to emergency department: Secondary | ICD-10-CM | POA: Diagnosis present

## 2016-03-12 DIAGNOSIS — R197 Diarrhea, unspecified: Secondary | ICD-10-CM | POA: Diagnosis present

## 2016-03-12 DIAGNOSIS — N179 Acute kidney failure, unspecified: Secondary | ICD-10-CM | POA: Diagnosis present

## 2016-03-12 DIAGNOSIS — I10 Essential (primary) hypertension: Secondary | ICD-10-CM | POA: Diagnosis not present

## 2016-03-12 DIAGNOSIS — A084 Viral intestinal infection, unspecified: Secondary | ICD-10-CM | POA: Diagnosis not present

## 2016-03-12 DIAGNOSIS — J9601 Acute respiratory failure with hypoxia: Secondary | ICD-10-CM | POA: Diagnosis present

## 2016-03-12 DIAGNOSIS — R402312 Coma scale, best motor response, none, at arrival to emergency department: Secondary | ICD-10-CM | POA: Diagnosis present

## 2016-03-12 DIAGNOSIS — F0280 Dementia in other diseases classified elsewhere without behavioral disturbance: Secondary | ICD-10-CM | POA: Diagnosis present

## 2016-03-12 DIAGNOSIS — R112 Nausea with vomiting, unspecified: Secondary | ICD-10-CM | POA: Diagnosis present

## 2016-03-12 DIAGNOSIS — A419 Sepsis, unspecified organism: Secondary | ICD-10-CM | POA: Diagnosis not present

## 2016-03-12 DIAGNOSIS — Z7982 Long term (current) use of aspirin: Secondary | ICD-10-CM

## 2016-03-12 DIAGNOSIS — I110 Hypertensive heart disease with heart failure: Secondary | ICD-10-CM | POA: Diagnosis present

## 2016-03-12 DIAGNOSIS — E872 Acidosis: Secondary | ICD-10-CM | POA: Diagnosis present

## 2016-03-12 DIAGNOSIS — R402112 Coma scale, eyes open, never, at arrival to emergency department: Secondary | ICD-10-CM | POA: Diagnosis present

## 2016-03-12 DIAGNOSIS — J189 Pneumonia, unspecified organism: Secondary | ICD-10-CM | POA: Diagnosis present

## 2016-03-12 DIAGNOSIS — I959 Hypotension, unspecified: Secondary | ICD-10-CM | POA: Diagnosis present

## 2016-03-12 DIAGNOSIS — Z79899 Other long term (current) drug therapy: Secondary | ICD-10-CM

## 2016-03-12 DIAGNOSIS — G250 Essential tremor: Secondary | ICD-10-CM | POA: Diagnosis present

## 2016-03-12 DIAGNOSIS — H9190 Unspecified hearing loss, unspecified ear: Secondary | ICD-10-CM | POA: Diagnosis present

## 2016-03-12 DIAGNOSIS — N390 Urinary tract infection, site not specified: Secondary | ICD-10-CM | POA: Diagnosis not present

## 2016-03-12 DIAGNOSIS — R000 Tachycardia, unspecified: Secondary | ICD-10-CM | POA: Diagnosis present

## 2016-03-12 DIAGNOSIS — Z9841 Cataract extraction status, right eye: Secondary | ICD-10-CM

## 2016-03-12 DIAGNOSIS — R402431 Glasgow coma scale score 3-8, in the field [EMT or ambulance]: Secondary | ICD-10-CM | POA: Diagnosis not present

## 2016-03-12 DIAGNOSIS — Z885 Allergy status to narcotic agent status: Secondary | ICD-10-CM | POA: Diagnosis not present

## 2016-03-12 DIAGNOSIS — E876 Hypokalemia: Secondary | ICD-10-CM | POA: Diagnosis present

## 2016-03-12 DIAGNOSIS — R4182 Altered mental status, unspecified: Secondary | ICD-10-CM | POA: Diagnosis not present

## 2016-03-12 DIAGNOSIS — K449 Diaphragmatic hernia without obstruction or gangrene: Secondary | ICD-10-CM | POA: Diagnosis not present

## 2016-03-12 DIAGNOSIS — R6521 Severe sepsis with septic shock: Secondary | ICD-10-CM

## 2016-03-12 DIAGNOSIS — G40909 Epilepsy, unspecified, not intractable, without status epilepticus: Secondary | ICD-10-CM | POA: Diagnosis present

## 2016-03-12 DIAGNOSIS — Z9842 Cataract extraction status, left eye: Secondary | ICD-10-CM

## 2016-03-12 DIAGNOSIS — I509 Heart failure, unspecified: Secondary | ICD-10-CM | POA: Diagnosis not present

## 2016-03-12 LAB — URINALYSIS, ROUTINE W REFLEX MICROSCOPIC
Bilirubin Urine: NEGATIVE
Glucose, UA: NEGATIVE mg/dL
HGB URINE DIPSTICK: NEGATIVE
Ketones, ur: NEGATIVE mg/dL
LEUKOCYTES UA: NEGATIVE
Nitrite: NEGATIVE
PROTEIN: NEGATIVE mg/dL
Specific Gravity, Urine: 1.018 (ref 1.005–1.030)
pH: 6.5 (ref 5.0–8.0)

## 2016-03-12 LAB — CBC WITH DIFFERENTIAL/PLATELET
BASOS PCT: 0 %
Basophils Absolute: 0 10*3/uL (ref 0.0–0.1)
Eosinophils Absolute: 0.1 10*3/uL (ref 0.0–0.7)
Eosinophils Relative: 1 %
HCT: 41.7 % (ref 39.0–52.0)
HEMOGLOBIN: 13.9 g/dL (ref 13.0–17.0)
Lymphocytes Relative: 44 %
Lymphs Abs: 3.8 10*3/uL (ref 0.7–4.0)
MCH: 29.8 pg (ref 26.0–34.0)
MCHC: 33.3 g/dL (ref 30.0–36.0)
MCV: 89.3 fL (ref 78.0–100.0)
Monocytes Absolute: 0.6 10*3/uL (ref 0.1–1.0)
Monocytes Relative: 6 %
Neutro Abs: 4.2 10*3/uL (ref 1.7–7.7)
Neutrophils Relative %: 49 %
Platelets: 335 10*3/uL (ref 150–400)
RBC: 4.67 MIL/uL (ref 4.22–5.81)
RDW: 13.5 % (ref 11.5–15.5)
WBC: 8.6 10*3/uL (ref 4.0–10.5)

## 2016-03-12 LAB — COMPREHENSIVE METABOLIC PANEL
ALBUMIN: 4.4 g/dL (ref 3.5–5.0)
ALK PHOS: 98 U/L (ref 38–126)
ALT: 23 U/L (ref 17–63)
ANION GAP: 10 (ref 5–15)
AST: 36 U/L (ref 15–41)
BUN: 14 mg/dL (ref 6–20)
CALCIUM: 9.7 mg/dL (ref 8.9–10.3)
CHLORIDE: 100 mmol/L — AB (ref 101–111)
CO2: 22 mmol/L (ref 22–32)
Creatinine, Ser: 1.54 mg/dL — ABNORMAL HIGH (ref 0.61–1.24)
GFR calc non Af Amer: 38 mL/min — ABNORMAL LOW (ref >60)
GFR, EST AFRICAN AMERICAN: 44 mL/min — AB (ref >60)
GLUCOSE: 154 mg/dL — AB (ref 65–99)
POTASSIUM: 3.5 mmol/L (ref 3.5–5.1)
SODIUM: 132 mmol/L — AB (ref 135–145)
Total Bilirubin: 0.7 mg/dL (ref 0.3–1.2)
Total Protein: 7.6 g/dL (ref 6.5–8.1)

## 2016-03-12 LAB — I-STAT CG4 LACTIC ACID, ED: Lactic Acid, Venous: 2.96 mmol/L (ref 0.5–1.9)

## 2016-03-12 LAB — I-STAT TROPONIN, ED
TROPONIN I, POC: 0.12 ng/mL — AB (ref 0.00–0.08)
Troponin i, poc: 0.03 ng/mL (ref 0.00–0.08)
Troponin i, poc: 0.06 ng/mL (ref 0.00–0.08)

## 2016-03-12 LAB — VITAMIN B12: VITAMIN B 12: 7171 pg/mL — AB (ref 180–914)

## 2016-03-12 LAB — MRSA PCR SCREENING: MRSA by PCR: POSITIVE — AB

## 2016-03-12 LAB — LACTIC ACID, PLASMA
LACTIC ACID, VENOUS: 4.1 mmol/L — AB (ref 0.5–1.9)
Lactic Acid, Venous: 4.4 mmol/L (ref 0.5–1.9)
Lactic Acid, Venous: 2.6 mmol/L (ref 0.5–1.9)

## 2016-03-12 LAB — D-DIMER, QUANTITATIVE: D-Dimer, Quant: 1.09 ug/mL-FEU — ABNORMAL HIGH (ref 0.00–0.50)

## 2016-03-12 LAB — PROCALCITONIN: Procalcitonin: <0.1 ng/mL

## 2016-03-12 LAB — STREP PNEUMONIAE URINARY ANTIGEN: STREP PNEUMO URINARY ANTIGEN: NEGATIVE

## 2016-03-12 MED ORDER — SODIUM CHLORIDE 0.9 % IV BOLUS (SEPSIS)
1000.0000 mL | Freq: Once | INTRAVENOUS | Status: AC
  Administered 2016-03-12: 1000 mL via INTRAVENOUS

## 2016-03-12 MED ORDER — ONDANSETRON HCL 4 MG/2ML IJ SOLN: 4.0000 mg | Freq: Four times a day (QID) | INTRAMUSCULAR | Status: DC | PRN

## 2016-03-12 MED ORDER — ONDANSETRON HCL 4 MG/2ML IJ SOLN
4.0000 mg | Freq: Four times a day (QID) | INTRAMUSCULAR | Status: AC
  Administered 2016-03-13 (×2): 4 mg via INTRAVENOUS
  Filled 2016-03-12 (×2): qty 2

## 2016-03-12 MED ORDER — ACETAMINOPHEN 325 MG PO TABS
650.0000 mg | ORAL_TABLET | Freq: Four times a day (QID) | ORAL | Status: DC | PRN
  Administered 2016-03-15 – 2016-03-19 (×3): 650 mg via ORAL
  Filled 2016-03-12 (×3): qty 2

## 2016-03-12 MED ORDER — LAMOTRIGINE 25 MG PO TABS
75.0000 mg | ORAL_TABLET | Freq: Two times a day (BID) | ORAL | Status: DC
  Administered 2016-03-12 – 2016-03-23 (×22): 75 mg via ORAL
  Filled 2016-03-12 (×23): qty 3

## 2016-03-12 MED ORDER — SODIUM CHLORIDE 0.9 % IV SOLN
1000.0000 mL | INTRAVENOUS | Status: DC
  Administered 2016-03-12 – 2016-03-13 (×3): 1000 mL via INTRAVENOUS

## 2016-03-12 MED ORDER — DEXTROSE 5 % IV SOLN
2.0000 g | Freq: Once | INTRAVENOUS | Status: AC
  Administered 2016-03-12: 2 g via INTRAVENOUS
  Filled 2016-03-12: qty 2

## 2016-03-12 MED ORDER — SODIUM CHLORIDE 0.9% FLUSH
3.0000 mL | Freq: Two times a day (BID) | INTRAVENOUS | Status: DC
  Administered 2016-03-12 – 2016-03-23 (×22): 3 mL via INTRAVENOUS

## 2016-03-12 MED ORDER — VANCOMYCIN HCL 10 G IV SOLR
2000.0000 mg | Freq: Once | INTRAVENOUS | Status: AC
  Administered 2016-03-12: 2000 mg via INTRAVENOUS
  Filled 2016-03-12: qty 2000

## 2016-03-12 MED ORDER — DEXTROSE 5 % IV SOLN
2.0000 g | INTRAVENOUS | Status: AC
  Administered 2016-03-13 – 2016-03-14 (×2): 2 g via INTRAVENOUS
  Filled 2016-03-12 (×2): qty 2

## 2016-03-12 MED ORDER — VANCOMYCIN HCL 10 G IV SOLR
1250.0000 mg | INTRAVENOUS | Status: DC
  Administered 2016-03-13: 1250 mg via INTRAVENOUS
  Filled 2016-03-12 (×2): qty 1250

## 2016-03-12 MED ORDER — ONDANSETRON HCL 4 MG PO TABS
4.0000 mg | ORAL_TABLET | Freq: Four times a day (QID) | ORAL | Status: DC | PRN
Start: 2016-03-12 — End: 2016-03-12

## 2016-03-12 MED ORDER — PANTOPRAZOLE SODIUM 40 MG PO TBEC
40.0000 mg | DELAYED_RELEASE_TABLET | Freq: Every day | ORAL | Status: DC
  Administered 2016-03-12 – 2016-03-19 (×8): 40 mg via ORAL
  Filled 2016-03-12 (×8): qty 1

## 2016-03-12 MED ORDER — ACETAMINOPHEN 650 MG RE SUPP: 650.0000 mg | Freq: Four times a day (QID) | RECTAL | Status: DC | PRN

## 2016-03-12 MED ORDER — HEPARIN SODIUM (PORCINE) 5000 UNIT/ML IJ SOLN
5000.0000 [IU] | Freq: Three times a day (TID) | INTRAMUSCULAR | Status: DC
  Administered 2016-03-12 – 2016-03-14 (×7): 5000 [IU] via SUBCUTANEOUS
  Filled 2016-03-12 (×7): qty 1

## 2016-03-12 MED ORDER — LACOSAMIDE 50 MG PO TABS
50.0000 mg | ORAL_TABLET | Freq: Every day | ORAL | Status: DC
  Administered 2016-03-13 – 2016-03-23 (×11): 50 mg via ORAL
  Filled 2016-03-12 (×11): qty 1

## 2016-03-12 MED ORDER — LACOSAMIDE 50 MG PO TABS
150.0000 mg | ORAL_TABLET | Freq: Every day | ORAL | Status: DC
  Administered 2016-03-12 – 2016-03-22 (×10): 150 mg via ORAL
  Filled 2016-03-12 (×10): qty 3

## 2016-03-12 MED ORDER — ASPIRIN 81 MG PO CHEW
81.0000 mg | CHEWABLE_TABLET | Freq: Every evening | ORAL | Status: DC
  Administered 2016-03-12 – 2016-03-22 (×11): 81 mg via ORAL
  Filled 2016-03-12 (×11): qty 1

## 2016-03-12 MED ORDER — ONDANSETRON HCL 4 MG/2ML IJ SOLN
4.0000 mg | Freq: Once | INTRAMUSCULAR | Status: AC
  Administered 2016-03-12: 4 mg via INTRAVENOUS
  Filled 2016-03-12: qty 2

## 2016-03-12 MED ORDER — VANCOMYCIN HCL IN DEXTROSE 1-5 GM/200ML-% IV SOLN
1000.0000 mg | Freq: Once | INTRAVENOUS | Status: DC
  Filled 2016-03-12: qty 200

## 2016-03-12 NOTE — Consult Note (Signed)
PULMONARY / CRITICAL CARE MEDICINE   Name: Barry Dean MRN: [id number] DOB: 17-Nov-1923    ADMISSION DATE:  03/12/2016 CONSULTATION DATE: 03/12/16   REFERRING MD:  Dr. Zachery Dakins  CHIEF COMPLAINT:  Nausea / Vomiting / Diarrhea   HISTORY OF PRESENT ILLNESS:  82 year old male with a past medical history of seizures, deafness (wears hearing aids), anxiety, depression, GERD, arthritis, essential tremor, BPH, melanoma, mitral regurgitation, hypertension and atrial fibrillation who presented to the [facility] ER on 7/17 with complaints of abdominal pain, nausea, vomiting and diarrhea for 3 days.  The patient resides at an assisted living facility [facility]).  The patient was found sitting in a wheelchair slumped over/unresponsive, pale and diaphoretic on a.m. of presentation prompting evaluation. Initial ER evaluation found him to be hypotensive with pressures in the 0000000 systolic. He also was unable to purchase the pain in initial emergency room evaluation due to altered mental status. Labs- NA 132, K3.5, chloride 100, serum creatinine 1.54 (baseline appears to be approximately 0.9), glucose 154, alkaline phosphatase 98, albumin 4.4, AST 36/ALT 23, lactic acid 4.4, troponin 0.03, WBC 8.6, hemoglobin 13.9 and platelets 335.  The patient was pancultured and empirically started on broad-spectrum antibiotics. After 1 L of normal saline, his blood pressure had improved to [id number] systolic. His mentation also improved and he was able to answer orientation questions appropriately.  Initial chest x-ray was concerning for left lower lobe infiltrate.  PCCM consulted for evaluation of possible sepsis.  PAST MEDICAL HISTORY :  He  has a past medical history of Hypertension; Seizures ([place]); A-fib ([place]); Dehydration; Deafness; Anxiety; Reflux; Arthritis; Depression; Atrial fibrillation ([place]); Tremor, essential; Seizure ([place]); BPH (benign prostatic hyperplasia); Syncope; Deaf; Melanoma [facility]); GERD  (gastroesophageal reflux disease); Mitral regurgitation; Abnormality of gait; and Tremor (09/27/2015).  PAST SURGICAL HISTORY: He  has past surgical history that includes unknown; Appendectomy; Cataract extraction w/ intraocular lens  implant, bilateral; and Esophagogastroduodenoscopy (egd) with propofol (N/A, 09/15/2014).  Allergies  Allergen Reactions  . Topamax Other (See Comments)    Unknown reaction  . Uroxatral [Alfuzosin Hydrochloride] Other (See Comments)    Unknown reaction  . Valium Other (See Comments)    Unknown reaction    No current facility-administered medications on file prior to encounter.   Current Outpatient Prescriptions on File Prior to Encounter  Medication Sig  . amLODipine-benazepril (LOTREL) 5-20 MG per capsule Take 1 capsule by mouth daily.  Marland Kitchen aspirin 81 MG chewable tablet Chew 1 tablet (81 mg total) by mouth every evening.  . lacosamide (VIMPAT) 50 MG TABS tablet Take 1 tablet in the AM (50 mg) and 3 tablets (150 mg ) in the PM. (Patient taking differently: Take 50-150 mg by mouth 2 (two) times daily. Take 1 tablet in the AM (50 mg) and 3 tablets (150 mg ) in the PM.)  . lamoTRIgine (LAMICTAL) 25 MG tablet Take 25mg  twice daily x 14 days, then take 50mg  twice daily x 14 days, then take 75mg  twice daily thereafter. (Patient taking differently: Take 75 mg by mouth 2 (two) times daily. )  . pantoprazole (PROTONIX) 40 MG tablet Take 1 tablet (40 mg total) by mouth 2 (two) times daily. (Patient taking differently: Take 40 mg by mouth daily. )  . vitamin B-12 (CYANOCOBALAMIN) 1000 MCG tablet Take 2,000 mcg by mouth daily.  Marland Kitchen acetaminophen (TYLENOL) 500 MG tablet Take 500 mg by mouth every 6 (six) hours as needed for pain. Not to exceed 3 gms of acetaminophen in 24 hours  .  HYDROcodone-acetaminophen (NORCO/VICODIN) 5-325 MG per tablet Take 1 tablet by mouth every 8 (eight) hours as needed for moderate pain.  Marland Kitchen sennosides-docusate sodium (SENOKOT-S) 8.6-50 MG tablet Take  1 tablet by mouth at bedtime as needed for constipation.    FAMILY HISTORY:  His indicated that his mother is deceased. He indicated that his father is deceased. He indicated that his sister is alive. He indicated that his brother is alive.   SOCIAL HISTORY: He  reports that he has never smoked. He has never used smokeless tobacco. He reports that he does not drink alcohol or use illicit drugs.  REVIEW OF SYSTEMS:  POSITIVES IN BOLD Gen: Denies fever, chills, weight change, fatigue, night sweats HEENT: Denies blurred vision, double vision, hearing loss, tinnitus, sinus congestion, rhinorrhea, sore throat, neck stiffness, dysphagia PULM: Denies mild shortness of breath, cough, sputum production, hemoptysis, wheezing CV: Denies chest pain, edema, orthopnea, paroxysmal nocturnal dyspnea, palpitations GI: Denies abdominal pain, nausea, vomiting, diarrhea, hematochezia, melena, constipation, change in bowel habits GU: Denies dysuria, hematuria, polyuria, oliguria, urethral discharge Endocrine: Denies hot or cold intolerance, polyuria, polyphagia or appetite change Derm: Denies rash, dry skin, scaling or peeling skin change Heme: Denies easy bruising, bleeding, bleeding gums Neuro: Denies headache, numbness, weakness, slurred speech, loss of memory or consciousness    SUBJECTIVE:  RN reports rectal temp 98.1  VITAL SIGNS: BP 120/94 mmHg  Pulse 104  Temp(Src) 98.1 F (36.7 C) (Rectal)  Resp 25  SpO2 100%  HEMODYNAMICS:    VENTILATOR SETTINGS:    INTAKE / OUTPUT:    PHYSICAL EXAMINATION: General:  Well developed elderly male in NAD Neuro:  AAOx4, speech clear, MAE / generalized weakness  HEENT:  MM pink/dry, no jvd Cardiovascular:  s1s2 rrr, mild tachy  Lungs:  Even/non-labored, lungs bilaterally clear Abdomen:  Obese/soft, bsx4 active  Musculoskeletal:  No acute deformities  Skin:  Warm/dry, no edema  LABS:  BMET  Recent Labs Lab 03/12/16 1154  NA 132*  K 3.5  CL  100*  CO2 22  BUN 14  CREATININE 1.54*  GLUCOSE 154*    Electrolytes  Recent Labs Lab 03/12/16 1154  CALCIUM 9.7    CBC  Recent Labs Lab 03/12/16 1154  WBC 8.6  HGB 13.9  HCT 41.7  PLT 335    Coag's No results for input(s): APTT, INR in the last 168 hours.  Sepsis Markers  Recent Labs Lab 03/12/16 1217  LATICACIDVEN 4.4*    ABG No results for input(s): PHART, PCO2ART, PO2ART in the last 168 hours.  Liver Enzymes  Recent Labs Lab 03/12/16 1154  AST 36  ALT 23  ALKPHOS 98  BILITOT 0.7  ALBUMIN 4.4    Cardiac Enzymes No results for input(s): TROPONINI, PROBNP in the last 168 hours.  Glucose No results for input(s): GLUCAP in the last 168 hours.  Imaging Dg Chest Portable 1 View  03/12/2016  CLINICAL DATA:  Altered mental status with nausea and vomiting EXAM: PORTABLE CHEST 1 VIEW COMPARISON:  April 30, 2014 and March 05, 2015 FINDINGS: There is bibasilar interstitial fibrosis. There is focal airspace consolidation in the left lower lobe. Heart is mildly enlarged with pulmonary vascularity within normal limits. There is atherosclerotic calcification in the aorta. No adenopathy. There is an old healed fracture of the proximal left humerus. IMPRESSION: Left lower lobe airspace consolidation. Bibasilar interstitial fibrosis. Stable mild cardiomegaly. There is aortic atherosclerosis. Electronically Signed   By: Lowella Grip III M.D.   On: 03/12/2016 12:49  STUDIES:  CT Head 7/17 >>  CT ABD/Pelvis 7/17 >>   CULTURES: BCx2 7/17 >>  UC 7/17 >>  GI PCR 7/17 >>   ANTIBIOTICS: Vanco 7/17 >>  Cefepime 7/17 >>   SIGNIFICANT EVENTS: 7/17  Admit with 3 day hx of N/V/D  LINES/TUBES:  DISCUSSION: 82 year old male admitted 7/17 with 3 day history of nausea vomiting and diarrhea. Questionable left lower lobe infiltrate versus atelectasis. Profound volume depletion with hypotension on admission. BP improved after 1 L of saline. Pan cultures  pending. Empiric coverage for HCAP/sepsis.  ASSESSMENT / PLAN:  PULMONARY A: P:    CARDIOVASCULAR A:  Hypotension - suspect in setting of volume depletion with diarrhea and vomiting. Rule out infectious etiology. Hx HTN, AF, MR P:  Continue volume resuscitation with NS  Monitor in SDU  No vasopressors, discussed with HCPOA Hold home anti-hypertensive regimen  RENAL A:   AKI - in setting of volume depletion, possible sepsis  Hx BPH P:   Ensure adequate hydration Trend BMP / UOP   GASTROINTESTINAL A:   Nausea / Vomiting /Diarrhea P:   Clear liquid diet as tolerated  HEMATOLOGIC A:   P:   INFECTIOUS A:   Possible LLL Infiltrate N/V/D - rule out infectious etiology  P:   Enteric precautions Monitor WBC / fever curve Cultures as above  Abx as above   ENDOCRINE A:   At Risk Hypoglycemia - with poor PO intake   P:   Monitor glucose on BMP  NEUROLOGIC A:   Hx Seizures, Essential Tremor Depression / Anxiety P:   RASS goal: n/a Defer to primary MD   FAMILY  - Updates: Daughter Manuela Schwartz) updated on patients status, plan of care.  Discussed the concept of vasopressors with patient and he asked that I speak with Manuela Schwartz.  She is in agreement to continue aggressive medical care up to the point of vasopressors.  DNR/DNI.  Full medical care otherwise.  - Inter-disciplinary family meet or Palliative Care meeting due by:  7/24     Noe Gens, NP-C [facility] [place] [facility] Pgr: [phone] or if no answer [phone] 03/12/2016, 3:03 PM

## 2016-03-12 NOTE — ED Notes (Signed)
Patient placed on 4L nasal cannula, O2 from 88% to 100%.  Patient had 4-5 episodes of emesis upon arrival to ED.

## 2016-03-12 NOTE — ED Provider Notes (Signed)
CSN: [id number]     Arrival date & time 03/12/16  1124 History   First MD Initiated Contact with Patient 03/12/16 1136     Chief Complaint  Patient presents with  . Loss of Consciousness   (Consider location/radiation/quality/duration/timing/severity/associated sxs/prior Treatment) Patient is a 82 y.o. male presenting with altered mental status. The history is provided by the EMS personnel and a relative.  Altered Mental Status Presenting symptoms: unresponsiveness   Severity:  Severe Most recent episode:  Today Episode history:  Continuous Timing:  Constant Progression:  Worsening Chronicity:  Recurrent   Past Medical History  Diagnosis Date  . Hypertension   . Seizures ([place])   . Dehydration   . Deafness   . Anxiety   . Reflux   . Arthritis   . Depression   . Atrial fibrillation ([place])   . Tremor, essential   . Seizure ([place])   . BPH (benign prostatic hyperplasia)   . Syncope   . Melanoma ([place])   . GERD (gastroesophageal reflux disease)   . Mitral regurgitation   . Abnormality of gait   . Tremor 09/27/2015   Past Surgical History  Procedure Laterality Date  . Unknown    . Appendectomy    . Cataract extraction w/ intraocular lens  implant, bilateral    . Esophagogastroduodenoscopy (egd) with propofol N/A 09/15/2014    Procedure: ESOPHAGOGASTRODUODENOSCOPY (EGD) WITH PROPOFOL;  Surgeon: [name], MD;  Location: [facility] ENDOSCOPY;  Service: Endoscopy;  Laterality: N/A;   Family History  Problem Relation Age of Onset  . Cancer Mother     Skin cancer  . Stroke Father   . Parkinsonism Sister   . Diabetes Brother    Social History  Substance Use Topics  . Smoking status: Never Smoker   . Smokeless tobacco: Never Used  . Alcohol Use: No    Review of Systems  Unable to perform ROS: Mental status change      Allergies  Topamax; Uroxatral; and Valium  Home Medications   Prior to Admission medications   Medication Sig Start Date End Date Taking? Authorizing  Provider  amLODipine-benazepril (LOTREL) 5-20 MG per capsule Take 1 capsule by mouth daily.   Yes Historical Provider, MD  aspirin 81 MG chewable tablet Chew 1 tablet (81 mg total) by mouth every evening. 09/22/14  Yes Thurnell Lose, MD  lacosamide (VIMPAT) 50 MG TABS tablet Take 1 tablet in the AM (50 mg) and 3 tablets (150 mg ) in the PM. Patient taking differently: Take 50-150 mg by mouth 2 (two) times daily. Take 1 tablet in the AM (50 mg) and 3 tablets (150 mg ) in the PM. 01/31/16  Yes Ward Givens, NP  lamoTRIgine (LAMICTAL) 25 MG tablet Take 25mg  twice daily x 14 days, then take 50mg  twice daily x 14 days, then take 75mg  twice daily thereafter. Patient taking differently: Take 75 mg by mouth 2 (two) times daily.  02/06/16  Yes Ward Givens, NP  mineral oil external liquid Place 3 drops into both ears every 7 (seven) days.   Yes Historical Provider, MD  pantoprazole (PROTONIX) 40 MG tablet Take 1 tablet (40 mg total) by mouth 2 (two) times daily. Patient taking differently: Take 40 mg by mouth daily.  09/17/14  Yes Thurnell Lose, MD  vitamin B-12 (CYANOCOBALAMIN) 1000 MCG tablet Take 2,000 mcg by mouth daily.   Yes Historical Provider, MD  acetaminophen (TYLENOL) 500 MG tablet Take 500 mg by mouth every 6 (six) hours as  needed for pain. Not to exceed 3 gms of acetaminophen in 24 hours    Historical Provider, MD  HYDROcodone-acetaminophen (NORCO/VICODIN) 5-325 MG per tablet Take 1 tablet by mouth every 8 (eight) hours as needed for moderate pain.    Historical Provider, MD  sennosides-docusate sodium (SENOKOT-S) 8.6-50 MG tablet Take 1 tablet by mouth at bedtime as needed for constipation.    Historical Provider, MD   BP 120/94 mmHg  Pulse 104  Temp(Src) 98.1 F (36.7 C) (Rectal)  Resp 25  SpO2 100% Physical Exam  Constitutional: He appears toxic. He appears distressed.  Emesis and diarrhea on exam  HENT:  Head: Normocephalic and atraumatic.  Eyes: Pupils are equal, round, and  reactive to light.  Neck: No JVD present. No thyromegaly present.  Cardiovascular: Normal rate.   Murmur heard. Pulmonary/Chest: No respiratory distress. He has no rales.  Abdominal: Soft. He exhibits no distension.  Genitourinary: Penis normal.  Musculoskeletal: Normal range of motion. He exhibits no edema.  Neurological: GCS eye subscore is 1. GCS verbal subscore is 1. GCS motor subscore is 1.    ED Course  Procedures (including critical care time) Labs Review Labs Reviewed  COMPREHENSIVE METABOLIC PANEL - Abnormal; Notable for the following:    Sodium 132 (*)    Chloride 100 (*)    Glucose, Bld 154 (*)    Creatinine, Ser 1.54 (*)    GFR calc non Af Amer 38 (*)    GFR calc Af Amer 44 (*)    All other components within normal limits  LACTIC ACID, PLASMA - Abnormal; Notable for the following:    Lactic Acid, Venous 4.4 (*)    All other components within normal limits  LACTIC ACID, PLASMA - Abnormal; Notable for the following:    Lactic Acid, Venous 4.1 (*)    All other components within normal limits  D-DIMER, QUANTITATIVE (NOT AT [facility]) - Abnormal; Notable for the following:    D-Dimer, Quant 1.09 (*)    All other components within normal limits  URINE CULTURE  CULTURE, BLOOD (ROUTINE X 2)  CULTURE, BLOOD (ROUTINE X 2)  GASTROINTESTINAL PANEL BY PCR, STOOL (REPLACES STOOL CULTURE)  GRAM STAIN  CULTURE, EXPECTORATED SPUTUM-ASSESSMENT  CBC WITH DIFFERENTIAL/PLATELET  URINALYSIS, ROUTINE W REFLEX MICROSCOPIC (NOT AT [facility])  NOROVIRUS GROUP 1 & 2 BY PCR, STOOL  STREP PNEUMONIAE URINARY ANTIGEN  PROCALCITONIN  COMPREHENSIVE METABOLIC PANEL  CBC  I-STAT TROPOININ, ED  I-STAT TROPOININ, ED    Imaging Review Ct Abdomen Pelvis Wo Contrast  03/12/2016  CLINICAL DATA:  Patient found unresponsive and diaphoretic today. Recurrent diarrhea and hypotension today. Initial encounter. EXAM: CT ABDOMEN AND PELVIS WITHOUT CONTRAST TECHNIQUE: Multidetector CT imaging of the abdomen and  pelvis was performed following the standard protocol without IV contrast. COMPARISON:  CT abdomen and pelvis 09/13/2014. FINDINGS: Large hiatal hernia is seen. Left worse than right basilar atelectasis. Micronodularity in the lung bases is chronic and compatible with some prior infectious or inflammatory process. There appears to be sludge versus gravel type stones dependently within an otherwise unremarkable gallbladder. The liver, spleen, adrenal glands, pancreas and kidneys appear normal. Aortoiliac atherosclerosis is identified. No fluid collection or lymphadenopathy is seen. There is no evidence of bowel obstruction. Liquid type stool is seen in the colon. Rectal tube is in place. No lymphadenopathy or fluid is seen. No focal bony abnormality is identified. Lumbar scoliosis and scattered spondylosis noted. IMPRESSION: Liquid type stool throughout the colon compatible with diarrhea. No other acute abnormality  is seen. Atherosclerosis. Large hiatal hernia with an intrathoracic stomach. Associated left basilar atelectasis is noted. Possible gallbladder sludge or tiny stones without evidence of cholecystitis. Electronically Signed   By: Inge Rise M.D.   On: 03/12/2016 15:41   Ct Head Wo Contrast  03/12/2016  CLINICAL DATA:  Mental status changes today. EXAM: CT HEAD WITHOUT CONTRAST TECHNIQUE: Contiguous axial images were obtained from the base of the skull through the vertex without intravenous contrast. COMPARISON:  Head CT 03/05/2015 FINDINGS: Stable age related cerebral atrophy, ventriculomegaly and periventricular white matter disease. No extra-axial fluid collections are identified. No CT findings for acute hemispheric infarction or intracranial hemorrhage. No mass lesions. The brainstem and cerebellum are normal. The bony structures are intact. No acute fracture or bone lesion. The paranasal sinuses and mastoid air cells are grossly clear. The globes are intact. IMPRESSION: Stable age related  cerebral atrophy, ventriculomegaly and periventricular white matter disease. No acute intracranial findings or mass lesion. Electronically Signed   By: Marijo Sanes M.D.   On: 03/12/2016 15:31   Dg Chest Portable 1 View  03/12/2016  CLINICAL DATA:  Altered mental status with nausea and vomiting EXAM: PORTABLE CHEST 1 VIEW COMPARISON:  April 30, 2014 and March 05, 2015 FINDINGS: There is bibasilar interstitial fibrosis. There is focal airspace consolidation in the left lower lobe. Heart is mildly enlarged with pulmonary vascularity within normal limits. There is atherosclerotic calcification in the aorta. No adenopathy. There is an old healed fracture of the proximal left humerus. IMPRESSION: Left lower lobe airspace consolidation. Bibasilar interstitial fibrosis. Stable mild cardiomegaly. There is aortic atherosclerosis. Electronically Signed   By: Lowella Grip III M.D.   On: 03/12/2016 12:49   I have personally reviewed and evaluated these images and lab results as part of my medical decision-making.   EKG Interpretation   Date/Time:  Monday March 12 2016 12:24:57 EDT Ventricular Rate:  81 PR Interval:    QRS Duration: 88 QT Interval:  396 QTC Calculation: 460 R Axis:   -178 Text Interpretation:  Sinus rhythm Right axis deviation Abnormal R-wave  progression, late transition Baseline wander in lead(s) V2 No significant  change since last tracing Confirmed by Winfred Leeds  MD, SAM [phone]) on  03/12/2016 12:51:53 PM      MDM   Final diagnoses:  Septic shock [facility])    The patient is a 82 year old male with a history of Parkinson's and dementia presented today with altered mental status and unresponsiveness. EMS reports some emesis and diarrhea and root. Daughter at bedside reports this has been ongoing for the past 3 days.  Found to be hypotensive to maps in the 40s with no initial tachycardia. GCS initially 3 but the DNR and DO NOT INTUBATE. 2 large-bore IVs in place 2 L fluid given  initially and blood and urine cultures obtained. Initial lactic acid of 4.4. Additional fluids given to complete 30 mL per KG. Possible pneumonia on chest x-ray and antibiotics given to cover for HCAP.  Given Zofran for emesis. Continued to have emesis with diarrhea but low suspicion for GI bleed. Consult the critical care due to the patient's mental status and continued elevated lactic acid. CT head performed initially due to altered mental status without clear source. This was negative for any acute intracranial abnormalities. CT abdomen with no acute infectious or surgical etiology of hypotension. Low suspicion for cardiogenic for shocked if causes of shock. They evaluated the patient in the emergency department feel appropriate for stepdown.  I reevaluated  the patient who began having increasing mental status and was able to answer questions appropriately. I discussed this with the hospitalist who evaluated the patient in the emergency department and agrees to admission.  Labs were viewed by myself and incorporated into medical decision making.  Discussed pertinent finding with patient or caregiver prior to admission with no further questions.  Pt care supervised by my attending Dr.   Geronimo Boot, MD PGY-3 Emergency Medicine     Geronimo Boot, MD 03/12/16 [place], MD 03/12/16 [id number]

## 2016-03-12 NOTE — Progress Notes (Signed)
Pharmacy Antibiotic Note  Barry Dean is a 82 y.o. male admitted on 03/12/2016 after being found unresponsive, pale and diaphortic.  eCrCl 30-35 ml/min.   Plan: -Cefepime 2g IV q24h -Vancomycin 2 g IV x1 then 1250 mg IV q24h -Monitor renal fx, cultures, VT as needed    No data recorded.   Recent Labs Lab 03/12/16 1154 03/12/16 1217  WBC 8.6  --   CREATININE 1.54*  --   LATICACIDVEN  --  4.4*    CrCl cannot be calculated (Unknown ideal weight.).    Allergies  Allergen Reactions  . Topamax Other (See Comments)    Unknown reaction  . Uroxatral [Alfuzosin Hydrochloride] Other (See Comments)    Unknown reaction  . Valium Other (See Comments)    Unknown reaction    Antimicrobials this admission: 7/17 vancomycin > 7/17 cefepime >   Dose adjustments this admission: NA   Microbiology results: 7/17 blood cx: 7/17 urine cx:   Thank you for allowing pharmacy to be a part of this patient's care.  Harvel Quale 03/12/2016 2:02 PM

## 2016-03-12 NOTE — ED Provider Notes (Addendum)
Level V caveat altered mental status, acuity of situation. History is obtained from paramedics and from patient's daughter who accompanies him Patient found to be less responsive this morning at assisted living facility. He reportedly had several episodes of vomiting and several episodes of diarrhea. He was treated by EMS with supplemental oxygen in the field.. No known seizure activity the field.. On exam patient is arousable to verbal stimulus. Lungs clear auscultation heart regular rate and rhythm abdomen nondistended, nontender extremities without edema. Patient noted to be hypotensive... Etiology unclear at present.  Orlie Dakin, MD 03/12/16 1653  1225 pm pt remains hypotensive with bp78/56. Patient with continued vomiting and diarrhea. Responsive to verbal stimulus with eye opening. 2 peripheral IVs established. IV fluid bolus given at 30 mL/kg,, due to hypotension, elevated lactate. Intravenous antibiotics ordered.  Hypotension likely secondary to volume loss, and suspected sepsis. Intensive care unit team consulted for admission Results for orders placed or performed during the hospital encounter of 03/12/16  CBC with Differential  Result Value Ref Range   WBC 8.6 4.0 - 10.5 K/uL   RBC 4.67 4.22 - 5.81 MIL/uL   Hemoglobin 13.9 13.0 - 17.0 g/dL   HCT 41.7 39.0 - 52.0 %   MCV 89.3 78.0 - 100.0 fL   MCH 29.8 26.0 - 34.0 pg   MCHC 33.3 30.0 - 36.0 g/dL   RDW 13.5 11.5 - 15.5 %   Platelets 335 150 - 400 K/uL   Neutrophils Relative % 49 %   Neutro Abs 4.2 1.7 - 7.7 K/uL   Lymphocytes Relative 44 %   Lymphs Abs 3.8 0.7 - 4.0 K/uL   Monocytes Relative 6 %   Monocytes Absolute 0.6 0.1 - 1.0 K/uL   Eosinophils Relative 1 %   Eosinophils Absolute 0.1 0.0 - 0.7 K/uL   Basophils Relative 0 %   Basophils Absolute 0.0 0.0 - 0.1 K/uL  Comprehensive metabolic panel  Result Value Ref Range   Sodium 132 (L) 135 - 145 mmol/L   Potassium 3.5 3.5 - 5.1 mmol/L   Chloride 100 (L) 101 - 111  mmol/L   CO2 22 22 - 32 mmol/L   Glucose, Bld 154 (H) 65 - 99 mg/dL   BUN 14 6 - 20 mg/dL   Creatinine, Ser 1.54 (H) 0.61 - 1.24 mg/dL   Calcium 9.7 8.9 - 10.3 mg/dL   Total Protein 7.6 6.5 - 8.1 g/dL   Albumin 4.4 3.5 - 5.0 g/dL   AST 36 15 - 41 U/L   ALT 23 17 - 63 U/L   Alkaline Phosphatase 98 38 - 126 U/L   Total Bilirubin 0.7 0.3 - 1.2 mg/dL   GFR calc non Af Amer 38 (L) >60 mL/min   GFR calc Af Amer 44 (L) >60 mL/min   Anion gap 10 5 - 15  Lactic acid, plasma  Result Value Ref Range   Lactic Acid, Venous 4.4 (HH) 0.5 - 1.9 mmol/L  Lactic acid, plasma  Result Value Ref Range   Lactic Acid, Venous 4.1 (HH) 0.5 - 1.9 mmol/L  Urinalysis, Routine w reflex microscopic (not at [facility])  Result Value Ref Range   Color, Urine YELLOW YELLOW   APPearance CLEAR CLEAR   Specific Gravity, Urine 1.018 1.005 - 1.030   pH 6.5 5.0 - 8.0   Glucose, UA NEGATIVE NEGATIVE mg/dL   Hgb urine dipstick NEGATIVE NEGATIVE   Bilirubin Urine NEGATIVE NEGATIVE   Ketones, ur NEGATIVE NEGATIVE mg/dL   Protein, ur NEGATIVE NEGATIVE mg/dL  Nitrite NEGATIVE NEGATIVE   Leukocytes, UA NEGATIVE NEGATIVE  D-dimer, quantitative (not at [facility])  Result Value Ref Range   D-Dimer, Quant 1.09 (H) 0.00 - 0.50 ug/mL-FEU  I-Stat Troponin, ED - 0, 3, 6 hours (not at [facility])  Result Value Ref Range   Troponin i, poc 0.03 0.00 - 0.08 ng/mL   Comment 3          I-Stat Troponin, ED - 0, 3, 6 hours (not at [facility])  Result Value Ref Range   Troponin i, poc 0.06 0.00 - 0.08 ng/mL   Comment 3           Ct Abdomen Pelvis Wo Contrast  03/12/2016  CLINICAL DATA:  Patient found unresponsive and diaphoretic today. Recurrent diarrhea and hypotension today. Initial encounter. EXAM: CT ABDOMEN AND PELVIS WITHOUT CONTRAST TECHNIQUE: Multidetector CT imaging of the abdomen and pelvis was performed following the standard protocol without IV contrast. COMPARISON:  CT abdomen and pelvis 09/13/2014. FINDINGS: Large hiatal hernia is seen. Left  worse than right basilar atelectasis. Micronodularity in the lung bases is chronic and compatible with some prior infectious or inflammatory process. There appears to be sludge versus gravel type stones dependently within an otherwise unremarkable gallbladder. The liver, spleen, adrenal glands, pancreas and kidneys appear normal. Aortoiliac atherosclerosis is identified. No fluid collection or lymphadenopathy is seen. There is no evidence of bowel obstruction. Liquid type stool is seen in the colon. Rectal tube is in place. No lymphadenopathy or fluid is seen. No focal bony abnormality is identified. Lumbar scoliosis and scattered spondylosis noted. IMPRESSION: Liquid type stool throughout the colon compatible with diarrhea. No other acute abnormality is seen. Atherosclerosis. Large hiatal hernia with an intrathoracic stomach. Associated left basilar atelectasis is noted. Possible gallbladder sludge or tiny stones without evidence of cholecystitis. Electronically Signed   By: Inge Rise M.D.   On: 03/12/2016 15:41   Ct Head Wo Contrast  03/12/2016  CLINICAL DATA:  Mental status changes today. EXAM: CT HEAD WITHOUT CONTRAST TECHNIQUE: Contiguous axial images were obtained from the base of the skull through the vertex without intravenous contrast. COMPARISON:  Head CT 03/05/2015 FINDINGS: Stable age related cerebral atrophy, ventriculomegaly and periventricular white matter disease. No extra-axial fluid collections are identified. No CT findings for acute hemispheric infarction or intracranial hemorrhage. No mass lesions. The brainstem and cerebellum are normal. The bony structures are intact. No acute fracture or bone lesion. The paranasal sinuses and mastoid air cells are grossly clear. The globes are intact. IMPRESSION: Stable age related cerebral atrophy, ventriculomegaly and periventricular white matter disease. No acute intracranial findings or mass lesion. Electronically Signed   By: Marijo Sanes M.D.    On: 03/12/2016 15:31   Dg Chest Portable 1 View  03/12/2016  CLINICAL DATA:  Altered mental status with nausea and vomiting EXAM: PORTABLE CHEST 1 VIEW COMPARISON:  April 30, 2014 and March 05, 2015 FINDINGS: There is bibasilar interstitial fibrosis. There is focal airspace consolidation in the left lower lobe. Heart is mildly enlarged with pulmonary vascularity within normal limits. There is atherosclerotic calcification in the aorta. No adenopathy. There is an old healed fracture of the proximal left humerus. IMPRESSION: Left lower lobe airspace consolidation. Bibasilar interstitial fibrosis. Stable mild cardiomegaly. There is aortic atherosclerosis. Electronically Signed   By: Lowella Grip III M.D.   On: 03/12/2016 12:49   CRITICAL CARE Performed by: Orlie Dakin Total critical care time: 35 minutes Critical care time was exclusive of separately billable procedures and treating other patients.  Critical care was necessary to treat or prevent imminent or life-threatening deterioration. Critical care was time spent personally by me on the following activities: development of treatment plan with patient and/or surrogate as well as nursing, discussions with consultants, evaluation of patient's response to treatment, examination of patient, obtaining history from patient or surrogate, ordering and performing treatments and interventions, ordering and review of laboratory studies, ordering and review of radiographic studies, pulse oximetry and re-evaluation of patient's condition.   Orlie Dakin, MD 03/12/16 1726

## 2016-03-12 NOTE — ED Notes (Signed)
Pt had several bouts of diarrhea. Pt's linens, brief, gown and disposable bed pads were changed.

## 2016-03-12 NOTE — H&P (Signed)
History and Physical    Barry Dean [id number] DOB: 1923-10-13 DOA: 03/12/2016  PCP: Barry Argyle, MD Patient coming from: assisted living [facility]  Chief Complaint: nausea/vomiting/diarrhea  HPI: Barry Dean is a very HOH 82 y.o. male with medical history significant for Parkinson's, BPH, mitral regurg, hypertension, A. fib, anxiety, depression, GERD presents to the emergency department from assisted living facility with the chief complaint acute encephalopathy. Initial evaluation reveals acute respiratory failure with hypoxia,  hypotension, acute kidney injury, elevated lactic acid, chest x-ray concerning for pneumonia.  Information is obtained from the chart and the staff this patient is too hard of hearing and ill to participate. Patient was found sitting in a wheelchair slumped over noted to be unresponsive to verbal stimuli. He was described as pale and diaphoretic. Reportedly he has had several episodes of vomiting and diarrhea at the facility. No reports of any complaints of chest pain shortness of breath cough fever chills. Patient very hard of hearing but denies pain at this time.    ED Course: In the emergency department code sepsis called he is provided with vigorous IV fluids and broad-spectrum antibiotics. He continues to have large amounts of liquid stool. Mentation improved after fluids blood pressure remains soft. He is evaluated by critical care  Review of Systems: As per HPI otherwise 10 point review of systems negative.   Ambulatory Status: Wheelchair-bound  Past Medical History  Diagnosis Date  . Hypertension   . Seizures ([place])   . Dehydration   . Deafness   . Anxiety   . Reflux   . Arthritis   . Depression   . Atrial fibrillation ([place])   . Tremor, essential   . Seizure ([place])   . BPH (benign prostatic hyperplasia)   . Syncope   . Melanoma ([place])   . GERD (gastroesophageal reflux disease)   . Mitral regurgitation   . Abnormality of  gait   . Tremor 09/27/2015    Past Surgical History  Procedure Laterality Date  . Unknown    . Appendectomy    . Cataract extraction w/ intraocular lens  implant, bilateral    . Esophagogastroduodenoscopy (egd) with propofol N/A 09/15/2014    Procedure: ESOPHAGOGASTRODUODENOSCOPY (EGD) WITH PROPOFOL;  Surgeon: [name], MD;  Location: [facility]COPY;  Service: Endoscopy;  Laterality: N/A;    Social History   Social History  . Marital Status: Unknown    Spouse Name: N/A  . Number of Children: N/A  . Years of Education: N/A   Occupational History  . Not on file.   Social History Main Topics  . Smoking status: Never Smoker   . Smokeless tobacco: Never Used  . Alcohol Use: No  . Drug Use: No  . Sexual Activity: Not on file   Other Topics Concern  . Not on file   Social History Narrative   ** Merged History Encounter **      Patient drinks about 2 cups of caffeine daily.   Patient is left handed.        Allergies  Allergen Reactions  . Topamax Other (See Comments)    Unknown reaction  . Uroxatral [Alfuzosin Hydrochloride] Other (See Comments)    Unknown reaction  . Valium Other (See Comments)    Unknown reaction    Family History  Problem Relation Age of Onset  . Cancer Mother     Skin cancer  . Stroke Father   . Parkinsonism Sister   . Diabetes Brother  Prior to Admission medications   Medication Sig Start Date End Date Taking? Authorizing Provider  amLODipine-benazepril (LOTREL) 5-20 MG per capsule Take 1 capsule by mouth daily.   Yes Historical Provider, MD  aspirin 81 MG chewable tablet Chew 1 tablet (81 mg total) by mouth every evening. 09/22/14  Yes Thurnell Lose, MD  lacosamide (VIMPAT) 50 MG TABS tablet Take 1 tablet in the AM (50 mg) and 3 tablets (150 mg ) in the PM. Patient taking differently: Take 50-150 mg by mouth 2 (two) times daily. Take 1 tablet in the AM (50 mg) and 3 tablets (150 mg ) in the PM. 01/31/16  Yes Ward Givens, NP    lamoTRIgine (LAMICTAL) 25 MG tablet Take 25mg  twice daily x 14 days, then take 50mg  twice daily x 14 days, then take 75mg  twice daily thereafter. Patient taking differently: Take 75 mg by mouth 2 (two) times daily.  02/06/16  Yes Ward Givens, NP  mineral oil external liquid Place 3 drops into both ears every 7 (seven) days.   Yes Historical Provider, MD  pantoprazole (PROTONIX) 40 MG tablet Take 1 tablet (40 mg total) by mouth 2 (two) times daily. Patient taking differently: Take 40 mg by mouth daily.  09/17/14  Yes Thurnell Lose, MD  vitamin B-12 (CYANOCOBALAMIN) 1000 MCG tablet Take 2,000 mcg by mouth daily.   Yes Historical Provider, MD  acetaminophen (TYLENOL) 500 MG tablet Take 500 mg by mouth every 6 (six) hours as needed for pain. Not to exceed 3 gms of acetaminophen in 24 hours    Historical Provider, MD  HYDROcodone-acetaminophen (NORCO/VICODIN) 5-325 MG per tablet Take 1 tablet by mouth every 8 (eight) hours as needed for moderate pain.    Historical Provider, MD  sennosides-docusate sodium (SENOKOT-S) 8.6-50 MG tablet Take 1 tablet by mouth at bedtime as needed for constipation.    Historical Provider, MD    Physical Exam: Filed Vitals:   03/12/16 1232 03/12/16 1300 03/12/16 1345 03/12/16 1400  BP: 80/40 113/54 108/61 120/94  Pulse:    104  Temp:      TempSrc:      Resp:  24 25   SpO2:    100%     General:  Appears Pale and comfortable very hard of hearing Eyes:  PERRL, EOMI, normal lids, iris ENT:  grossly normal hearing, lips & tongue, mmm Neck:  no LAD, masses or thyromegaly Cardiovascular:  Tachycardic but regular heart sounds somewhat distant, no m/r/g. No LE edema.  Respiratory:  Normal effort breath sounds with fair air flow clear bilaterally I hear no wheeze no rhonchi Abdomen:  soft, positive bowel sounds but sluggish I'll tenderness diffusely to palpation Skin:  no rash or induration seen on limited exam Musculoskeletal:  grossly normal tone BUE/BLE, good  ROM, no bony abnormality Psychiatric:  grossly normal mood and affect, speech fluent and appropriate, AOx3 Neurologic:  Somewhat lethargic responds to verbal stimuli if he can. Attempts to follow commands oriented to person only  Labs on Admission: I have personally reviewed following labs and imaging studies  CBC:  Recent Labs Lab 03/12/16 1154  WBC 8.6  NEUTROABS 4.2  HGB 13.9  HCT 41.7  MCV 89.3  PLT 123456   Basic Metabolic Panel:  Recent Labs Lab 03/12/16 1154  NA 132*  K 3.5  CL 100*  CO2 22  GLUCOSE 154*  BUN 14  CREATININE 1.54*  CALCIUM 9.7   GFR: CrCl cannot be calculated (Unknown ideal weight.). Liver Function  Tests:  Recent Labs Lab 03/12/16 1154  AST 36  ALT 23  ALKPHOS 98  BILITOT 0.7  PROT 7.6  ALBUMIN 4.4   No results for input(s): LIPASE, AMYLASE in the last 168 hours. No results for input(s): AMMONIA in the last 168 hours. Coagulation Profile: No results for input(s): INR, PROTIME in the last 168 hours. Cardiac Enzymes: No results for input(s): CKTOTAL, CKMB, CKMBINDEX, TROPONINI in the last 168 hours. BNP (last 3 results) No results for input(s): PROBNP in the last 8760 hours. HbA1C: No results for input(s): HGBA1C in the last 72 hours. CBG: No results for input(s): GLUCAP in the last 168 hours. Lipid Profile: No results for input(s): CHOL, HDL, LDLCALC, TRIG, CHOLHDL, LDLDIRECT in the last 72 hours. Thyroid Function Tests: No results for input(s): TSH, T4TOTAL, FREET4, T3FREE, THYROIDAB in the last 72 hours. Anemia Panel: No results for input(s): VITAMINB12, FOLATE, FERRITIN, TIBC, IRON, RETICCTPCT in the last 72 hours. Urine analysis:    Component Value Date/Time   COLORURINE YELLOW 03/12/2016 1414   APPEARANCEUR CLEAR 03/12/2016 1414   LABSPEC 1.018 03/12/2016 1414   PHURINE 6.5 03/12/2016 1414   GLUCOSEU NEGATIVE 03/12/2016 1414   HGBUR NEGATIVE 03/12/2016 1414   BILIRUBINUR NEGATIVE 03/12/2016 1414   KETONESUR NEGATIVE  03/12/2016 1414   PROTEINUR NEGATIVE 03/12/2016 1414   UROBILINOGEN 1.0 11/12/2014 1132   NITRITE NEGATIVE 03/12/2016 1414   LEUKOCYTESUR NEGATIVE 03/12/2016 1414    Creatinine Clearance: CrCl cannot be calculated (Unknown ideal weight.).  Sepsis Labs: @LABRCNTIP (procalcitonin:4,lacticidven:4) )No results found for this or any previous visit (from the past 240 hour(s)).   Radiological Exams on Admission: Ct Abdomen Pelvis Wo Contrast  03/12/2016  CLINICAL DATA:  Patient found unresponsive and diaphoretic today. Recurrent diarrhea and hypotension today. Initial encounter. EXAM: CT ABDOMEN AND PELVIS WITHOUT CONTRAST TECHNIQUE: Multidetector CT imaging of the abdomen and pelvis was performed following the standard protocol without IV contrast. COMPARISON:  CT abdomen and pelvis 09/13/2014. FINDINGS: Large hiatal hernia is seen. Left worse than right basilar atelectasis. Micronodularity in the lung bases is chronic and compatible with some prior infectious or inflammatory process. There appears to be sludge versus gravel type stones dependently within an otherwise unremarkable gallbladder. The liver, spleen, adrenal glands, pancreas and kidneys appear normal. Aortoiliac atherosclerosis is identified. No fluid collection or lymphadenopathy is seen. There is no evidence of bowel obstruction. Liquid type stool is seen in the colon. Rectal tube is in place. No lymphadenopathy or fluid is seen. No focal bony abnormality is identified. Lumbar scoliosis and scattered spondylosis noted. IMPRESSION: Liquid type stool throughout the colon compatible with diarrhea. No other acute abnormality is seen. Atherosclerosis. Large hiatal hernia with an intrathoracic stomach. Associated left basilar atelectasis is noted. Possible gallbladder sludge or tiny stones without evidence of cholecystitis. Electronically Signed   By: Inge Rise M.D.   On: 03/12/2016 15:41   Ct Head Wo Contrast  03/12/2016  CLINICAL DATA:   Mental status changes today. EXAM: CT HEAD WITHOUT CONTRAST TECHNIQUE: Contiguous axial images were obtained from the base of the skull through the vertex without intravenous contrast. COMPARISON:  Head CT 03/05/2015 FINDINGS: Stable age related cerebral atrophy, ventriculomegaly and periventricular white matter disease. No extra-axial fluid collections are identified. No CT findings for acute hemispheric infarction or intracranial hemorrhage. No mass lesions. The brainstem and cerebellum are normal. The bony structures are intact. No acute fracture or bone lesion. The paranasal sinuses and mastoid air cells are grossly clear. The globes are intact. IMPRESSION:  Stable age related cerebral atrophy, ventriculomegaly and periventricular white matter disease. No acute intracranial findings or mass lesion. Electronically Signed   By: Marijo Sanes M.D.   On: 03/12/2016 15:31   Dg Chest Portable 1 View  03/12/2016  CLINICAL DATA:  Altered mental status with nausea and vomiting EXAM: PORTABLE CHEST 1 VIEW COMPARISON:  April 30, 2014 and March 05, 2015 FINDINGS: There is bibasilar interstitial fibrosis. There is focal airspace consolidation in the left lower lobe. Heart is mildly enlarged with pulmonary vascularity within normal limits. There is atherosclerotic calcification in the aorta. No adenopathy. There is an old healed fracture of the proximal left humerus. IMPRESSION: Left lower lobe airspace consolidation. Bibasilar interstitial fibrosis. Stable mild cardiomegaly. There is aortic atherosclerosis. Electronically Signed   By: Lowella Grip III M.D.   On: 03/12/2016 12:49    EKG: Independently reviewed Sinus rhythm Right axis deviation Abnormal R-wave progression, late transition Baseline wander in lead(s) V2 No significant change since last tracing  Assessment/Plan Principal Problem:   Sepsis ([place]) Active Problems:   HCAP (healthcare-associated pneumonia)   Seizure disorder (HCC)   Hyponatremia    Benign essential HTN   Parkinson's disease (HCC)   PAF (paroxysmal atrial fibrillation) (HCC)   Nausea & vomiting   Diarrhea   Acute encephalopathy   Acute kidney injury ([place])   Acute respiratory failure with hypoxia ([place])   #1. Sepsis. Chest x-ray concerning for pneumonia. Patient with acute respiratory failure with hypoxia, acute kidney injury, tachycardic hypotensive with elevated lactic acid and altered mental status upon presentation. Provided with vigorous IV fluids and broad-spectrum antibiotics. Evaluated by critical care who opined septic shock with good response to fluids likely related to diarrhea and extreme dehydration and or left lower lobe healthcare associated pneumonia versus aspiration. -Admit to SD -Continue IV fluids -Continue broad-spectrum antibiotics -Track lactic acid -Blood cultures -Sputum cultures as able -Strep pneumo urine antigen -Hold home amlodipine and benazepril -Monitor closely -appreciate CCM assistance  #2. Healthcare associated pneumonia. Chest x-ray as noted above.  -Antibiotics as noted above -Swallow eval when patient more alert -See #1  #3. Acute kidney injury. Likely related to extreme dehydration in setting of nausea vomiting extreme diarrhea. Creatinine 1.54 on admission. -IV fluids as noted above -Hold nephrotoxins -Monitor urine output -Recheck in the morning  #4. Acute encephalopathy. Likely related to above. CT of the head without acute abnormalities. Patient's sensorium improved after IV fluids -We'll check B-12 folate -Hold any sedating medications -Monitor closely keep him find patient is very hard of hearing  #5. Diarrhea. Patient with multiple episodes while in the emergency department. Rectal tube placed in the ED. CT without acute abnormality. No reports any recent antibiotics -Stool for norovirus -gi pathogen panel -clear liquids -bmet in am  6. Acute respiratory failure with hypoxia. Likely related to healthcare  associated pneumonia. Not on home oxygen. Sats 85% on room air in the emergency department. Chest x-ray as noted above. D-dimer 1.09. Heart rate 106 -Continue above therapy -If no improvement consider VQ scan to rule out PE -Antibiotics as noted above -See #2  #7. Nausea and vomiting. Etiology unclear. CT of abdomen pelvis reveals liquid stool no other abnormality -Scheduled Zofran -Clear liquids -bmet in am  #8. Hypertension. Home medications include Lotrel -Hypotensive on presentation secondary to hypovolemia and sepsis -Hold Lotrel -monitor   DVT prophylaxis: scd  Code Status: dnr  Family Communication: none present  Disposition Plan: back to facility, interdisciplinary meeting scheduled for 03/19/16 Consults called: none  Admission status: inpatient    Radene Gunning MD [facility]  If 7PM-7AM, please contact night-coverage [email or web address] Password [facility]  03/12/2016, 4:38 PM

## 2016-03-13 ENCOUNTER — Inpatient Hospital Stay (HOSPITAL_COMMUNITY): Payer: Medicare Other

## 2016-03-13 DIAGNOSIS — J189 Pneumonia, unspecified organism: Secondary | ICD-10-CM

## 2016-03-13 DIAGNOSIS — A419 Sepsis, unspecified organism: Secondary | ICD-10-CM | POA: Diagnosis present

## 2016-03-13 DIAGNOSIS — R197 Diarrhea, unspecified: Secondary | ICD-10-CM

## 2016-03-13 DIAGNOSIS — G20 Parkinson's disease: Secondary | ICD-10-CM

## 2016-03-13 LAB — GASTROINTESTINAL PANEL BY PCR, STOOL (REPLACES STOOL CULTURE)
ADENOVIRUS F40/41: NOT DETECTED
ASTROVIRUS: NOT DETECTED
CYCLOSPORA CAYETANENSIS: NOT DETECTED
Campylobacter species: NOT DETECTED
Cryptosporidium: NOT DETECTED
E. COLI O157: NOT DETECTED
ENTAMOEBA HISTOLYTICA: NOT DETECTED
ENTEROPATHOGENIC E COLI (EPEC): NOT DETECTED
ENTEROTOXIGENIC E COLI (ETEC): NOT DETECTED
Enteroaggregative E coli (EAEC): NOT DETECTED
Giardia lamblia: NOT DETECTED
NOROVIRUS GI/GII: NOT DETECTED
Plesimonas shigelloides: NOT DETECTED
Rotavirus A: NOT DETECTED
SAPOVIRUS (I, II, IV, AND V): NOT DETECTED
SHIGA LIKE TOXIN PRODUCING E COLI (STEC): NOT DETECTED
Salmonella species: NOT DETECTED
Shigella/Enteroinvasive E coli (EIEC): NOT DETECTED
VIBRIO CHOLERAE: NOT DETECTED
VIBRIO SPECIES: NOT DETECTED
Yersinia enterocolitica: NOT DETECTED

## 2016-03-13 LAB — CBC
HCT: 35.3 % — ABNORMAL LOW (ref 39.0–52.0)
HEMOGLOBIN: 11.8 g/dL — AB (ref 13.0–17.0)
MCH: 29.8 pg (ref 26.0–34.0)
MCHC: 33.4 g/dL (ref 30.0–36.0)
MCV: 89.1 fL (ref 78.0–100.0)
Platelets: 262 10*3/uL (ref 150–400)
RBC: 3.96 MIL/uL — AB (ref 4.22–5.81)
RDW: 13.8 % (ref 11.5–15.5)
WBC: 12.4 10*3/uL — ABNORMAL HIGH (ref 4.0–10.5)

## 2016-03-13 LAB — COMPREHENSIVE METABOLIC PANEL
ALT: 32 U/L (ref 17–63)
ANION GAP: 7 (ref 5–15)
AST: 57 U/L — ABNORMAL HIGH (ref 15–41)
Albumin: 3 g/dL — ABNORMAL LOW (ref 3.5–5.0)
Alkaline Phosphatase: 60 U/L (ref 38–126)
BUN: 21 mg/dL — ABNORMAL HIGH (ref 6–20)
CHLORIDE: 109 mmol/L (ref 101–111)
CO2: 20 mmol/L — AB (ref 22–32)
Calcium: 8.1 mg/dL — ABNORMAL LOW (ref 8.9–10.3)
Creatinine, Ser: 1.41 mg/dL — ABNORMAL HIGH (ref 0.61–1.24)
GFR calc non Af Amer: 42 mL/min — ABNORMAL LOW (ref >60)
GFR, EST AFRICAN AMERICAN: 49 mL/min — AB (ref >60)
Glucose, Bld: 129 mg/dL — ABNORMAL HIGH (ref 65–99)
POTASSIUM: 4.3 mmol/L (ref 3.5–5.1)
SODIUM: 136 mmol/L (ref 135–145)
Total Bilirubin: 0.6 mg/dL (ref 0.3–1.2)
Total Protein: 5.2 g/dL — ABNORMAL LOW (ref 6.5–8.1)

## 2016-03-13 LAB — LACTIC ACID, PLASMA
Lactic Acid, Venous: 2.1 mmol/L (ref 0.5–1.9)
Lactic Acid, Venous: 0.9 mmol/L (ref 0.5–1.9)

## 2016-03-13 LAB — URINE CULTURE: CULTURE: NO GROWTH

## 2016-03-13 LAB — C DIFFICILE QUICK SCREEN W PCR REFLEX
C DIFFICILE (CDIFF) INTERP: NOT DETECTED
C DIFFICILE (CDIFF) TOXIN: NEGATIVE
C Diff antigen: NEGATIVE

## 2016-03-13 MED ORDER — DM-GUAIFENESIN ER 30-600 MG PO TB12
1.0000 | ORAL_TABLET | Freq: Two times a day (BID) | ORAL | Status: DC
  Administered 2016-03-13 – 2016-03-14 (×2): 1 via ORAL
  Filled 2016-03-13 (×2): qty 1

## 2016-03-13 MED ORDER — IPRATROPIUM-ALBUTEROL 0.5-2.5 (3) MG/3ML IN SOLN
3.0000 mL | Freq: Three times a day (TID) | RESPIRATORY_TRACT | Status: DC
  Administered 2016-03-13 – 2016-03-14 (×3): 3 mL via RESPIRATORY_TRACT
  Filled 2016-03-13 (×3): qty 3

## 2016-03-13 MED ORDER — BOOST / RESOURCE BREEZE PO LIQD
1.0000 | Freq: Three times a day (TID) | ORAL | Status: DC
  Administered 2016-03-13 – 2016-03-16 (×8): 1 via ORAL
  Administered 2016-03-17: 0.9875 via ORAL
  Administered 2016-03-17 – 2016-03-19 (×7): 1 via ORAL

## 2016-03-13 MED ORDER — METOPROLOL TARTRATE 5 MG/5ML IV SOLN
5.0000 mg | Freq: Three times a day (TID) | INTRAVENOUS | Status: DC
  Administered 2016-03-13 – 2016-03-14 (×3): 5 mg via INTRAVENOUS
  Filled 2016-03-13 (×3): qty 5

## 2016-03-13 MED ORDER — SODIUM CHLORIDE 0.9 % IV SOLN
1000.0000 mL | INTRAVENOUS | Status: DC
  Administered 2016-03-14: 1000 mL via INTRAVENOUS

## 2016-03-13 MED ORDER — SODIUM CHLORIDE 0.9 % IV BOLUS (SEPSIS)
1000.0000 mL | Freq: Once | INTRAVENOUS | Status: AC
  Administered 2016-03-13: 1000 mL via INTRAVENOUS

## 2016-03-13 NOTE — Progress Notes (Signed)
MBSS complete. Full report located under chart review in imaging section.  Dailynn Nancarrow MA, CCC-SLP [phone]   

## 2016-03-13 NOTE — Progress Notes (Signed)
PROGRESS NOTE    Barry Dean  [id number] DOB: Mar 30, 1924 DOA: 03/12/2016 PCP: [name], MD   Brief Narrative:  82 y.o. WM PMHx Anxiety, Depression Parkinson's, BPH, MV Regurg, HTN, A. fib, , GERD   Presents to the emergency department from assisted living facility with the chief complaint acute encephalopathy. Initial evaluation reveals acute respiratory failure with hypoxia, hypotension, acute kidney injury, elevated lactic acid, chest x-ray concerning for pneumonia.  Information is obtained from the chart and the staff this patient is too hard of hearing and ill to participate. Patient was found sitting in a wheelchair slumped over noted to be unresponsive to verbal stimuli. He was described as pale and diaphoretic. Reportedly he has had several episodes of vomiting and diarrhea at the facility. No reports of any complaints of chest pain shortness of breath cough fever chills. Patient very hard of hearing but denies pain at this time.   Assessment & Plan:   Principal Problem:   Sepsis due to pneumonia [facility]) Active Problems:   HCAP (healthcare-associated pneumonia)   Seizure disorder ([place])   Hyponatremia   Benign essential HTN   Parkinson's disease ([place])   PAF (paroxysmal atrial fibrillation) ([place])   Sepsis ([place])   Nausea & vomiting   Diarrhea   Acute encephalopathy   Acute kidney injury ([place])   Acute respiratory failure with hypoxia ([place])   Sepsis. HCAP Pneumonia. -Bolus 1 L normal saline then restart at 100 ml/hr -Continue broad-spectrum antibiotics -Track lactic acid -DuoNeb TID -Flutter valve -Recent DM BID  Acute respiratory failure with hypoxia.  -Resolved  -Titrate O2 to maintain SPO2 > 92%   Acute kidney injury(Cr1.54 on admission.) -. Likely related to extreme dehydration in setting of nausea vomiting extreme diarrhea. -IV fluids as noted above -Hold nephrotoxins -Strict in and out -Daily weight Filed Weights   03/12/16 1845  Weight:  95.5 kg (210 lb 8.6 oz)    Acute encephalopathy.  -CT of the head without acute abnormalities.  -Resolved  -very hard of hearing  Diarrhea. Watery  -Rectal tube in place  -C. difficile negative  -GI pathogen panel pending  Nausea and vomiting -Resolved  Hypertension.  -Metoprolol IV 5 mg  TID      DVT prophylaxis: Subcutaneous heparin Code Status: DO NOT RESUSCITATE Family Communication: None Disposition Plan: Resolution sepsis   Consultants:    Procedures/Significant Events:    Cultures 7/17 blood right AC/left hand NGTD 7/17 MRSA by PCR positive 7/17 urine negative 7/17 strep neuro urine antigen negative 7/18 C. difficile negative 7/18 GI panel pending   Antimicrobials: Cefepime7/17>> Vancomycin 7/17>>   Devices    LINES / TUBES:      Continuous Infusions: . sodium chloride       Subjective: 7/18 A/O 3 (does not know why)     Objective: Filed Vitals:   03/13/16 1100 03/13/16 1140 03/13/16 1200 03/13/16 1515  BP: 122/56  115/45 172/83  Pulse: 73  73 100  Temp:  97.6 F (36.4 C)  99.5 F (37.5 C)  TempSrc:  Oral  Oral  Resp: 20  20 25   Height:      Weight:      SpO2: 95%  94% 100%    Intake/Output Summary (Last 24 hours) at 03/13/16 2022 Last data filed at 03/13/16 1800  Gross per 24 hour  Intake   2978 ml  Output   1300 ml  Net   1678 ml   Filed Weights   03/12/16 1845  Weight: 95.5  kg (210 lb 8.6 oz)    Examination:  General: A/O 3 (does not know why),No acute respiratory distress Eyes: negative scleral hemorrhage, negative anisocoria, negative icterus ENT: Negative Runny nose, negative gingival bleeding, Neck:  Negative scars, masses, torticollis, lymphadenopathy, JVD Lungs: Clear to auscultation bilaterally without wheezes or crackles Cardiovascular: Regular rate and rhythm without murmur gallop or rub normal S1 and S2 Abdomen: negative abdominal pain, nondistended, positive soft, bowel sounds, no rebound, no  ascites, no appreciable mass Extremities: No significant cyanosis, clubbing, or edema bilateral lower extremities Skin: Negative rashes, lesions, ulcers Psychiatric:  Negative depression, negative anxiety, negative fatigue, negative mania  Central nervous system:  Cranial nerves II through XII intact, tongue/uvula midline, all extremities muscle strength 5/5, sensation intact throughout, negative dysarthria, negative expressive aphasia, negative receptive aphasia.  .     Data Reviewed: Care during the described time interval was provided by me .  I have reviewed this patient's available data, including medical history, events of note, physical examination, and all test results as part of my evaluation. I have personally reviewed and interpreted all radiology studies.  CBC:  Recent Labs Lab 03/12/16 1154 03/13/16 0319  WBC 8.6 12.4*  NEUTROABS 4.2  --   HGB 13.9 11.8*  HCT 41.7 35.3*  MCV 89.3 89.1  PLT 335 [id number]   Basic Metabolic Panel:  Recent Labs Lab 03/12/16 1154 03/13/16 0319  NA 132* 136  K 3.5 4.3  CL 100* 109  CO2 22 20*  GLUCOSE 154* 129*  BUN 14 21*  CREATININE 1.54* 1.41*  CALCIUM 9.7 8.1*   GFR: Estimated Creatinine Clearance: 39.4 mL/min (by C-G formula based on Cr of 1.41). Liver Function Tests:  Recent Labs Lab 03/12/16 1154 03/13/16 0319  AST 36 57*  ALT 23 32  ALKPHOS 98 60  BILITOT 0.7 0.6  PROT 7.6 5.2*  ALBUMIN 4.4 3.0*   No results for input(s): LIPASE, AMYLASE in the last 168 hours. No results for input(s): AMMONIA in the last 168 hours. Coagulation Profile: No results for input(s): INR, PROTIME in the last 168 hours. Cardiac Enzymes: No results for input(s): CKTOTAL, CKMB, CKMBINDEX, TROPONINI in the last 168 hours. BNP (last 3 results) No results for input(s): PROBNP in the last 8760 hours. HbA1C: No results for input(s): HGBA1C in the last 72 hours. CBG: No results for input(s): GLUCAP in the last 168 hours. Lipid Profile: No  results for input(s): CHOL, HDL, LDLCALC, TRIG, CHOLHDL, LDLDIRECT in the last 72 hours. Thyroid Function Tests: No results for input(s): TSH, T4TOTAL, FREET4, T3FREE, THYROIDAB in the last 72 hours. Anemia Panel:  Recent Labs  03/12/16 1950  VITAMINB12 7171*   Urine analysis:    Component Value Date/Time   COLORURINE YELLOW 03/12/2016 1414   APPEARANCEUR CLEAR 03/12/2016 1414   LABSPEC 1.018 03/12/2016 1414   PHURINE 6.5 03/12/2016 1414   GLUCOSEU NEGATIVE 03/12/2016 1414   HGBUR NEGATIVE 03/12/2016 1414   BILIRUBINUR NEGATIVE 03/12/2016 1414   KETONESUR NEGATIVE 03/12/2016 1414   PROTEINUR NEGATIVE 03/12/2016 1414   UROBILINOGEN 1.0 11/12/2014 1132   NITRITE NEGATIVE 03/12/2016 1414   LEUKOCYTESUR NEGATIVE 03/12/2016 1414   Sepsis Labs: @LABRCNTIP (procalcitonin:4,lacticidven:4)  ) Recent Results (from the past 240 hour(s))  Blood culture (routine x 2)     Status: None (Preliminary result)   Collection Time: 03/12/16 12:17 PM  Result Value Ref Range Status   Specimen Description BLOOD RIGHT ANTECUBITAL  Final   Special Requests BOTTLES DRAWN AEROBIC AND ANAEROBIC  5CC  Final  Culture NO GROWTH 1 DAY  Final   Report Status PENDING  Incomplete  Blood culture (routine x 2)     Status: None (Preliminary result)   Collection Time: 03/12/16 12:31 PM  Result Value Ref Range Status   Specimen Description BLOOD LEFT HAND  Final   Special Requests BOTTLES DRAWN AEROBIC AND ANAEROBIC  5CC  Final   Culture NO GROWTH 1 DAY  Final   Report Status PENDING  Incomplete  Urine culture     Status: None   Collection Time: 03/12/16  2:14 PM  Result Value Ref Range Status   Specimen Description URINE, RANDOM  Final   Special Requests NONE  Final   Culture NO GROWTH  Final   Report Status 03/13/2016 FINAL  Final  Gastrointestinal Panel by PCR , Stool     Status: None   Collection Time: 03/12/16  6:34 PM  Result Value Ref Range Status   Campylobacter species NOT DETECTED NOT DETECTED  Final   Plesimonas shigelloides NOT DETECTED NOT DETECTED Final   Salmonella species NOT DETECTED NOT DETECTED Final   Yersinia enterocolitica NOT DETECTED NOT DETECTED Final   Vibrio species NOT DETECTED NOT DETECTED Final   Vibrio cholerae NOT DETECTED NOT DETECTED Final   Enteroaggregative E coli (EAEC) NOT DETECTED NOT DETECTED Final   Enteropathogenic E coli (EPEC) NOT DETECTED NOT DETECTED Final   Enterotoxigenic E coli (ETEC) NOT DETECTED NOT DETECTED Final   Shiga like toxin producing E coli (STEC) NOT DETECTED NOT DETECTED Final   E. coli O157 NOT DETECTED NOT DETECTED Final   Shigella/Enteroinvasive E coli (EIEC) NOT DETECTED NOT DETECTED Final   Cryptosporidium NOT DETECTED NOT DETECTED Final   Cyclospora cayetanensis NOT DETECTED NOT DETECTED Final   Entamoeba histolytica NOT DETECTED NOT DETECTED Final   Giardia lamblia NOT DETECTED NOT DETECTED Final   Adenovirus F40/41 NOT DETECTED NOT DETECTED Final   Astrovirus NOT DETECTED NOT DETECTED Final   Norovirus GI/GII NOT DETECTED NOT DETECTED Final   Rotavirus A NOT DETECTED NOT DETECTED Final   Sapovirus (I, II, IV, and V) NOT DETECTED NOT DETECTED Final  MRSA PCR Screening     Status: Abnormal   Collection Time: 03/12/16  7:16 PM  Result Value Ref Range Status   MRSA by PCR POSITIVE (A) NEGATIVE Final    Comment:        The GeneXpert MRSA Assay (FDA approved for NASAL specimens only), is one component of a comprehensive MRSA colonization surveillance program. It is not intended to diagnose MRSA infection nor to guide or monitor treatment for MRSA infections. RESULT CALLED TO, READ BACK BY AND VERIFIED WITH: [facility] RN [id number] 03/12/16 A BROWNING   C difficile quick scan w PCR reflex     Status: None   Collection Time: 03/13/16 11:14 AM  Result Value Ref Range Status   C Diff antigen NEGATIVE NEGATIVE Final   C Diff toxin NEGATIVE NEGATIVE Final   C Diff interpretation No C. difficile detected.  Final          Radiology Studies: Ct Abdomen Pelvis Wo Contrast  03/12/2016  CLINICAL DATA:  Patient found unresponsive and diaphoretic today. Recurrent diarrhea and hypotension today. Initial encounter. EXAM: CT ABDOMEN AND PELVIS WITHOUT CONTRAST TECHNIQUE: Multidetector CT imaging of the abdomen and pelvis was performed following the standard protocol without IV contrast. COMPARISON:  CT abdomen and pelvis 09/13/2014. FINDINGS: Large hiatal hernia is seen. Left worse than right basilar atelectasis. Micronodularity in the lung  bases is chronic and compatible with some prior infectious or inflammatory process. There appears to be sludge versus gravel type stones dependently within an otherwise unremarkable gallbladder. The liver, spleen, adrenal glands, pancreas and kidneys appear normal. Aortoiliac atherosclerosis is identified. No fluid collection or lymphadenopathy is seen. There is no evidence of bowel obstruction. Liquid type stool is seen in the colon. Rectal tube is in place. No lymphadenopathy or fluid is seen. No focal bony abnormality is identified. Lumbar scoliosis and scattered spondylosis noted. IMPRESSION: Liquid type stool throughout the colon compatible with diarrhea. No other acute abnormality is seen. Atherosclerosis. Large hiatal hernia with an intrathoracic stomach. Associated left basilar atelectasis is noted. Possible gallbladder sludge or tiny stones without evidence of cholecystitis. Electronically Signed   By: Inge Rise M.D.   On: 03/12/2016 15:41   Ct Head Wo Contrast  03/12/2016  CLINICAL DATA:  Mental status changes today. EXAM: CT HEAD WITHOUT CONTRAST TECHNIQUE: Contiguous axial images were obtained from the base of the skull through the vertex without intravenous contrast. COMPARISON:  Head CT 03/05/2015 FINDINGS: Stable age related cerebral atrophy, ventriculomegaly and periventricular white matter disease. No extra-axial fluid collections are identified. No CT findings for  acute hemispheric infarction or intracranial hemorrhage. No mass lesions. The brainstem and cerebellum are normal. The bony structures are intact. No acute fracture or bone lesion. The paranasal sinuses and mastoid air cells are grossly clear. The globes are intact. IMPRESSION: Stable age related cerebral atrophy, ventriculomegaly and periventricular white matter disease. No acute intracranial findings or mass lesion. Electronically Signed   By: Marijo Sanes M.D.   On: 03/12/2016 15:31   Dg Chest Portable 1 View  03/12/2016  CLINICAL DATA:  Altered mental status with nausea and vomiting EXAM: PORTABLE CHEST 1 VIEW COMPARISON:  April 30, 2014 and March 05, 2015 FINDINGS: There is bibasilar interstitial fibrosis. There is focal airspace consolidation in the left lower lobe. Heart is mildly enlarged with pulmonary vascularity within normal limits. There is atherosclerotic calcification in the aorta. No adenopathy. There is an old healed fracture of the proximal left humerus. IMPRESSION: Left lower lobe airspace consolidation. Bibasilar interstitial fibrosis. Stable mild cardiomegaly. There is aortic atherosclerosis. Electronically Signed   By: Lowella Grip III M.D.   On: 03/12/2016 12:49   Dg Swallowing Func-speech Pathology  03/13/2016  Objective Swallowing Evaluation: Type of Study: MBS-Modified Barium Swallow Study Patient Details Name: BRITISH LENDER MRN: [id number] Date of Birth: 1924/02/15 Today's Date: 03/13/2016 Time: SLP Start Time (ACUTE ONLY): 1320-SLP Stop Time (ACUTE ONLY): 1340 SLP Time Calculation (min) (ACUTE ONLY): 20 min Past Medical History: Past Medical History Diagnosis Date . Hypertension  . Seizures ([place])  . Dehydration  . Deafness  . Anxiety  . Reflux  . Arthritis  . Depression  . Atrial fibrillation ([place])  . Tremor, essential  . Seizure ([place])  . BPH (benign prostatic hyperplasia)  . Syncope  . Melanoma ([place])  . GERD (gastroesophageal reflux disease)  . Mitral regurgitation  .  Abnormality of gait  . Tremor 09/27/2015 Past Surgical History: Past Surgical History Procedure Laterality Date . Unknown   . Appendectomy   . Cataract extraction w/ intraocular lens  implant, bilateral   . Esophagogastroduodenoscopy (egd) with propofol N/A 09/15/2014   Procedure: ESOPHAGOGASTRODUODENOSCOPY (EGD) WITH PROPOFOL;  Surgeon: [name], MD;  Location: [facility] ENDOSCOPY;  Service: Endoscopy;  Laterality: N/A; HPI: DEJAY SOOKDEO is a 82 y.o. male with a Past Medical History of Parkinson's, HTN, SZR, deafness,  reflux, A. fib, BPH, GERD, who presents with sepsis likely secondary to PNA (LLL HCAP vs aspiration). Patient presents from nursing home orders likely also picked up a viral gastroenteritis causing severe dehydration. BEdside swallow evaluations 03/29/14, 11/11/12 indicate normal swallowing function. MBS 07/20/11 with a mild pharyngeal dysphagia with recommendations for regular solids, thin liquids. No Data Recorded Assessment / Plan / Recommendation CHL IP CLINICAL IMPRESSIONS 03/13/2016 Therapy Diagnosis Mild pharyngeal phase dysphagia Clinical Impression Patient presents with a very mild pharyngeal phase dysphagia characterized by deep flash penetration of thin liquids (in reality largely normal for age) but without frank penetration or aspiration. Cannot f/o deep penetration and/or aspiration over the course of a meal. Degree and frequency of  penetration decreased with use of cup sip rather than straw. When patient ready to advance solids, recommend dysphagia 3 (per daughter wishes due to mastication difficulty reported at ALF), thin liquids with aspiration precautions.   Impact on safety and function Mild aspiration risk   CHL IP TREATMENT RECOMMENDATION 03/13/2016 Treatment Recommendations Therapy as outlined in treatment plan below   Prognosis 03/13/2016 Prognosis for Safe Diet Advancement Good Barriers to Reach Goals -- Barriers/Prognosis Comment -- CHL IP DIET RECOMMENDATION 03/13/2016 SLP Diet  Recommendations Dysphagia 3 (Mech soft) solids;Thin liquid Liquid Administration via Cup;No straw Medication Administration Whole meds with liquid Compensations Slow rate;Small sips/bites Postural Changes Seated upright at 90 degrees   CHL IP OTHER RECOMMENDATIONS 03/13/2016 Recommended Consults -- Oral Care Recommendations Oral care BID Other Recommendations --   CHL IP FOLLOW UP RECOMMENDATIONS 03/13/2016 Follow up Recommendations None   CHL IP FREQUENCY AND DURATION 03/13/2016 Speech Therapy Frequency (ACUTE ONLY) min 1 x/week Treatment Duration 1 week      CHL IP ORAL PHASE 03/13/2016 Oral Phase WFL Oral - Pudding Teaspoon -- Oral - Pudding Cup -- Oral - Honey Teaspoon -- Oral - Honey Cup -- Oral - Nectar Teaspoon -- Oral - Nectar Cup -- Oral - Nectar Straw -- Oral - Thin Teaspoon -- Oral - Thin Cup -- Oral - Thin Straw -- Oral - Puree -- Oral - Mech Soft -- Oral - Regular -- Oral - Multi-Consistency -- Oral - Pill -- Oral Phase - Comment --  CHL IP PHARYNGEAL PHASE 03/13/2016 Pharyngeal Phase Impaired Pharyngeal- Pudding Teaspoon -- Pharyngeal -- Pharyngeal- Pudding Cup -- Pharyngeal -- Pharyngeal- Honey Teaspoon -- Pharyngeal -- Pharyngeal- Honey Cup -- Pharyngeal -- Pharyngeal- Nectar Teaspoon -- Pharyngeal -- Pharyngeal- Nectar Cup -- Pharyngeal -- Pharyngeal- Nectar Straw -- Pharyngeal -- Pharyngeal- Thin Teaspoon -- Pharyngeal -- Pharyngeal- Thin Cup Delayed swallow initiation-pyriform sinuses;Delayed swallow initiation-vallecula;Penetration/Aspiration before swallow Pharyngeal Material enters airway, remains ABOVE vocal cords then ejected out Pharyngeal- Thin Straw Delayed swallow initiation-vallecula;Delayed swallow initiation-pyriform sinuses;Penetration/Aspiration before swallow Pharyngeal Material enters airway, remains ABOVE vocal cords then ejected out Pharyngeal- Puree WFL Pharyngeal -- Pharyngeal- Mechanical Soft WFL Pharyngeal -- Pharyngeal- Regular -- Pharyngeal -- Pharyngeal- Multi-consistency --  Pharyngeal -- Pharyngeal- Pill WFL Pharyngeal -- Pharyngeal Comment --  CHL IP CERVICAL ESOPHAGEAL PHASE 03/13/2016 Cervical Esophageal Phase WFL Pudding Teaspoon -- Pudding Cup -- Honey Teaspoon -- Honey Cup -- Nectar Teaspoon -- Nectar Cup -- Nectar Straw -- Thin Teaspoon -- Thin Cup -- Thin Straw -- Puree -- Mechanical Soft -- Regular -- Multi-consistency -- Pill -- Cervical Esophageal Comment -- No flowsheet data found. Gabriel Rainwater MA, CCC-SLP [phone] [place] Leah Meryl 03/13/2016, 1:56 PM                   Scheduled Meds: .  aspirin  81 mg Oral QPM  . ceFEPime (MAXIPIME) IV  2 g Intravenous Q24H  . dextromethorphan-guaiFENesin  1 tablet Oral BID  . feeding supplement  1 Container Oral TID BM  . heparin  5,000 Units Subcutaneous Q8H  . ipratropium-albuterol  3 mL Nebulization TID  . lacosamide  150 mg Oral QHS  . lacosamide  50 mg Oral Daily  . lamoTRIgine  75 mg Oral BID  . metoprolol  5 mg Intravenous Q8H  . pantoprazole  40 mg Oral Daily  . sodium chloride  1,000 mL Intravenous Once  . sodium chloride flush  3 mL Intravenous Q12H  . vancomycin  1,250 mg Intravenous Q24H   Continuous Infusions: . sodium chloride       LOS: 1 day    Time spent: 40 minutes    Kimi Kroft, Geraldo Docker, MD [facility] Pager [phone]   If 7PM-7AM, please contact night-coverage [email or web address] Password [email or web address] 03/13/2016, 8:22 PM

## 2016-03-13 NOTE — Progress Notes (Signed)
Initial Nutrition Assessment  INTERVENTION:   Boost Breeze po TID, each supplement provides 250 kcal and 9 grams of protein  NUTRITION DIAGNOSIS:   Inadequate oral intake related to altered GI function as evidenced by  (per report).  GOAL:   Patient will meet greater than or equal to 90% of their needs  MONITOR:   PO intake, Supplement acceptance, Diet advancement, I & O's, Skin  REASON FOR ASSESSMENT:   Low Braden    ASSESSMENT:   Pt with hx of being very HOH, Parkinson's, HTN, anxiety, depression admitted from SNF with acute encephalopathy with sepsis possible  PNA, nausea, vomiting and severe diarrhea with extreme dehydration from possible viral gastroenteritis.    Swallow evaluation completed today, ok for Dysphagia 3 with Thin Medications reviewed Labs reviewed: [id number]  Unable to complete Nutrition-Focused physical exam at this time.  Pt had just had another BM and was being cleaned up during visit. Unable to locate daughter.   Diet Order:  Diet clear liquid Room service appropriate?: Yes; Fluid consistency:: Thin  Skin:  Reviewed, no issues (redness noted on bliateral heels and sacrum per RN notes)  Last BM:  7/17  Height:   Ht Readings from Last 1 Encounters:  03/12/16 6\' 1"  (1.854 m)    Weight:   Wt Readings from Last 1 Encounters:  03/12/16 210 lb 8.6 oz (95.5 kg)    Ideal Body Weight:  83.6 kg  BMI:  Body mass index is 27.78 kg/(m^2).  Estimated Nutritional Needs:   Kcal:  2000-2200  Protein:  100-115 grams  Fluid:  > 2 L/day  EDUCATION NEEDS:   No education needs identified at this time  [name], [name], [name] Pager [phone] After Hours Pager

## 2016-03-13 NOTE — Evaluation (Signed)
Clinical/Bedside Swallow Evaluation Patient Details  Name: Barry Dean MRN: [id number] Date of Birth: 11-Nov-1923  Today's Date: 03/13/2016 Time: SLP Start Time (ACUTE ONLY): 0955 SLP Stop Time (ACUTE ONLY): 1011 SLP Time Calculation (min) (ACUTE ONLY): 16 min  Past Medical History:  Past Medical History  Diagnosis Date  . Hypertension   . Seizures ([place])   . Dehydration   . Deafness   . Anxiety   . Reflux   . Arthritis   . Depression   . Atrial fibrillation ([place])   . Tremor, essential   . Seizure ([place])   . BPH (benign prostatic hyperplasia)   . Syncope   . Melanoma ([place])   . GERD (gastroesophageal reflux disease)   . Mitral regurgitation   . Abnormality of gait   . Tremor 09/27/2015   Past Surgical History:  Past Surgical History  Procedure Laterality Date  . Unknown    . Appendectomy    . Cataract extraction w/ intraocular lens  implant, bilateral    . Esophagogastroduodenoscopy (egd) with propofol N/A 09/15/2014    Procedure: ESOPHAGOGASTRODUODENOSCOPY (EGD) WITH PROPOFOL;  Surgeon: [name], MD;  Location: [facility] ENDOSCOPY;  Service: Endoscopy;  Laterality: N/A;   HPI:  Barry Dean is a 82 y.o. male with a Past Medical History of Parkinson's, HTN, SZR, deafness, reflux, A. fib, BPH, GERD, who presents with sepsis likely secondary to PNA (LLL HCAP vs aspiration). Patient presents from nursing home orders likely also picked up a viral gastroenteritis causing severe dehydration. BEdside swallow evaluations 03/29/14, 11/11/12 indicate normal swallowing function. MBS 07/20/11 with a mild pharyngeal dysphagia with recommendations for regular solids, thin liquids.   Assessment / Plan / Recommendation Clinical Impression  As during previous evaluations, patient presents with a seemingly intact and normal oropharyngeal swallow. MBS from 2012 indicated a mild dysphagia but with continued recommendations for a regular diet. Given multiple admissions for PNA since that study,  advanced age, h/o Parkinson's, recommend repeat instrumental study to r/o silent aspiration. Will plan for MBS today.     Aspiration Risk  Mild aspiration risk    Diet Recommendation Thin liquid   Liquid Administration via: Cup;Straw Medication Administration: Whole meds with liquid Supervision: Patient able to self feed Compensations: Slow rate;Small sips/bites Postural Changes: Seated upright at 90 degrees;Remain upright for at least 30 minutes after po intake    Other  Recommendations Oral Care Recommendations: Oral care BID   Follow up Recommendations   (TBD)           Prognosis Prognosis for Safe Diet Advancement: Good      Swallow Study   General HPI: Barry Dean is a 82 y.o. male with a Past Medical History of Parkinson's, HTN, SZR, deafness, reflux, A. fib, BPH, GERD, who presents with sepsis likely secondary to PNA (LLL HCAP vs aspiration). Patient presents from nursing home orders likely also picked up a viral gastroenteritis causing severe dehydration. BEdside swallow evaluations 03/29/14, 11/11/12 indicate normal swallowing function. MBS 07/20/11 with a mild pharyngeal dysphagia with recommendations for regular solids, thin liquids. Type of Study: Bedside Swallow Evaluation Previous Swallow Assessment: MBS 2012:  Diet Prior to this Study: Thin liquids (clear liquid) Temperature Spikes Noted: No Respiratory Status: Nasal cannula History of Recent Intubation: No Behavior/Cognition: Alert (HOH) Oral Cavity Assessment: Within Functional Limits Oral Care Completed by SLP: No Oral Cavity - Dentition: Dentures, top;Dentures, bottom Vision: Functional for self-feeding Self-Feeding Abilities: Able to feed self Patient Positioning: Upright in bed Baseline Vocal Quality:  Normal Volitional Cough: Cognitively unable to elicit Volitional Swallow: Unable to elicit    Oral/Motor/Sensory Function Overall Oral Motor/Sensory Function: Within functional limits   Ice Chips Ice  chips: Not tested   Thin Liquid Thin Liquid: Within functional limits Presentation: Self Fed;Straw    Nectar Thick Nectar Thick Liquid: Not tested   Honey Thick Honey Thick Liquid: Not tested   Puree Puree: Within functional limits Presentation: Spoon   Solid   GO   Solid: Impaired Presentation: Self Fed Oral Phase Functional Implications: Prolonged oral transit       Haani Bakula MA, CCC-SLP [phone]  Barry [name] Dean 03/13/2016,10:15 AM

## 2016-03-13 NOTE — Evaluation (Signed)
Physical Therapy Evaluation Patient Details Name: Barry Dean MRN: [id number] DOB: 1923/11/29 Today's Date: 03/13/2016   History of Present Illness  Barry Dean is a 82 y.o. male with a Past Medical History of Parkinson's, HTN, SZR, deafness, reflux, A. fib, BPH, GERD, who presents with sepsis likely secondary to PNA (LLL HCAP vs aspiration). Patient presents from nursing home orders likely also picked up a viral gastroenteritis causing severe dehydration.     Clinical Impression  Pt admitted with above diagnosis. Pt currently with functional limitations due to the deficits listed below (see PT Problem List).  PTA pt required assist for bathing, dressing, and propelling WC long distances.  He has not been ambulatory for a few months and has required assist with RW to pivot to WC.  Today, pt fatigues with bed level therapeutic exercises.  Recommending SNF at d/c.  Pt will benefit from skilled PT to increase their independence and safety with mobility to allow discharge to the venue listed below.       Follow Up Recommendations SNF;Supervision/Assistance - 24 hour    Equipment Recommendations  None recommended by PT    Recommendations for Other Services       Precautions / Restrictions Precautions Precautions: Fall;Other (comment) Precaution Comments: h/o seizures Restrictions Weight Bearing Restrictions: No      Mobility  Bed Mobility               General bed mobility comments: deferred as pt will need +2 assist and pt needing to be cleaned up with catheter leaking, RN notified.  Transfers                    Ambulation/Gait                Stairs            Wheelchair Mobility    Modified Rankin (Stroke Patients Only)       Balance                                             Pertinent Vitals/Pain Pain Assessment: No/denies pain    Home Living Family/patient expects to be discharged to:: Skilled nursing  facility                      Prior Function Level of Independence: Needs assistance   Gait / Transfers Assistance Needed: Pt has not been ambulated for the past few months.  Per daughter the MD told him it was more safe for him not to ambulate due to his frequent falls.  He uses his LEs to propel his WC for short distances, otherwise needs assist.  Pt pivots to [place] with RW and assist.  ADL's / Homemaking Assistance Needed: Needs assist with bathing, dressing.  Ind with feeding per daughter.        Hand Dominance        Extremity/Trunk Assessment   Upper Extremity Assessment: Generalized weakness (tremor Rt UE)           Lower Extremity Assessment: Generalized weakness         Communication   Communication: HOH;Other (comment) (has hearing aides [place].  )  Cognition Arousal/Alertness: Awake/alert Behavior During Therapy: Flat affect Overall Cognitive Status: History of cognitive impairments - at baseline  General Comments General comments (skin integrity, edema, etc.): VSS    Exercises General Exercises - Upper Extremity Shoulder Flexion: AROM;Both;10 reps;Supine Elbow Flexion: AROM;Both;10 reps;Supine Elbow Extension: AROM;Both;10 reps;Supine Digit Composite Flexion: AROM;Both;10 reps;Supine General Exercises - Lower Extremity Ankle Circles/Pumps: AROM;Both;10 reps;AAROM;Supine Short Arc Quad: AROM;Both;5 reps;Supine Heel Slides: AAROM;Both;5 reps;Supine Straight Leg Raises: AAROM;Both;5 reps;Supine      Assessment/Plan    PT Assessment Patient needs continued PT services  PT Diagnosis Difficulty walking;Generalized weakness   PT Problem List Decreased strength;Decreased activity tolerance;Decreased balance;Decreased mobility;Decreased coordination;Decreased cognition;Decreased knowledge of use of DME;Decreased safety awareness  PT Treatment Interventions DME instruction;Gait training;Functional mobility  training;Therapeutic activities;Therapeutic exercise;Balance training;Neuromuscular re-education;Cognitive remediation;Patient/family education;Wheelchair mobility training   PT Goals (Current goals can be found in the Care Plan section) Acute Rehab PT Goals Patient Stated Goal: none stated PT Goal Formulation: With patient/family Time For Goal Achievement: 03/27/16 Potential to Achieve Goals: Fair    Frequency Min 2X/week   Barriers to discharge        Co-evaluation               End of Session   Activity Tolerance: Patient limited by fatigue;Patient tolerated treatment well Patient left: in bed;with call bell/phone within reach;with bed alarm set;with family/visitor present Nurse Communication: Mobility status;Other (comment);Need for lift equipment (pt needs to be cleaned up, catheter leaking)         Time: [id number] PT Time Calculation (min) (ACUTE ONLY): 18 min   Charges:   PT Evaluation $PT Eval Moderate Complexity: 1 Procedure     PT G Codes:       Collie Siad PT, DPT  Pager: [phone] Phone: [phone] 03/13/2016, 3:04 PM

## 2016-03-14 ENCOUNTER — Inpatient Hospital Stay (HOSPITAL_COMMUNITY): Payer: Medicare Other

## 2016-03-14 DIAGNOSIS — I509 Heart failure, unspecified: Secondary | ICD-10-CM

## 2016-03-14 LAB — FOLATE RBC
Folate, Hemolysate: 544.7 ng/mL
Folate, RBC: 1464 ng/mL (ref >498)
Hematocrit: 37.2 % — ABNORMAL LOW (ref 37.5–51.0)

## 2016-03-14 LAB — CBC WITH DIFFERENTIAL/PLATELET
BASOS ABS: 0 10*3/uL (ref 0.0–0.1)
BASOS PCT: 0 %
EOS ABS: 0 10*3/uL (ref 0.0–0.7)
Eosinophils Relative: 0 %
HCT: 29.9 % — ABNORMAL LOW (ref 39.0–52.0)
HEMOGLOBIN: 9.7 g/dL — AB (ref 13.0–17.0)
Lymphocytes Relative: 17 %
Lymphs Abs: 1.3 10*3/uL (ref 0.7–4.0)
MCH: 29 pg (ref 26.0–34.0)
MCHC: 32.4 g/dL (ref 30.0–36.0)
MCV: 89.3 fL (ref 78.0–100.0)
Monocytes Absolute: 0.7 10*3/uL (ref 0.1–1.0)
Monocytes Relative: 10 %
NEUTROS ABS: 5.7 10*3/uL (ref 1.7–7.7)
NEUTROS PCT: 73 %
Platelets: 185 10*3/uL (ref 150–400)
RBC: 3.35 MIL/uL — AB (ref 4.22–5.81)
RDW: 13.7 % (ref 11.5–15.5)
WBC: 7.7 10*3/uL (ref 4.0–10.5)

## 2016-03-14 LAB — ECHOCARDIOGRAM COMPLETE
Ao-asc: 30 cm
CHL CUP DOP CALC LVOT VTI: 25.6 cm
CHL CUP LVOT MV VTI INDEX: 1.51 cm2/m2
CHL CUP STROKE VOLUME: 47 mL
E/e' ratio: 15.87
EWDT: 278 ms
FS: 45 % — AB (ref 28–44)
Height: 73 in
IV/PV OW: 1.46
LA ID, A-P, ES: 34 mm
LA diam end sys: 34 mm
LA diam index: 1.53 cm/m2
LA vol A4C: 88.5 ml
LA vol index: 37.7 mL/m2
LA vol: 84 mL
LDCA: 4.15 cm2
LV PW d: 12.5 mm — AB (ref 0.6–1.1)
LV SIMPSON'S DISK: 64
LV TDI E'MEDIAL: 5.98
LV sys vol index: 12 mL/m2
LVDIAVOL: 73 mL (ref 62–150)
LVDIAVOLIN: 33 mL/m2
LVEEAVG: 15.87
LVEEMED: 15.87
LVELAT: 7.94 cm/s
LVOT SV: 106 mL
LVOT diameter: 23 mm
LVOT peak grad rest: 7 mmHg
LVOTPV: 128 cm/s
LVSYSVOL: 26 mL (ref 21–61)
MV Annulus VTI: 31.9 cm
MV Dec: 278
MV M vel: 75.7
MV Peak grad: 6 mmHg
MVAP: 2.65 cm2
MVPKAVEL: 110 m/s
MVPKEVEL: 126 m/s
Mean grad: 3 mmHg
P 1/2 time: 83 ms
TAPSE: 28.7 mm
TDI e' lateral: 7.94
Weight: 3354.52 oz

## 2016-03-14 LAB — COMPREHENSIVE METABOLIC PANEL
ALBUMIN: 2.5 g/dL — AB (ref 3.5–5.0)
ALK PHOS: 47 U/L (ref 38–126)
ALT: 58 U/L (ref 17–63)
ANION GAP: 4 — AB (ref 5–15)
AST: 83 U/L — ABNORMAL HIGH (ref 15–41)
BUN: 17 mg/dL (ref 6–20)
CALCIUM: 7.9 mg/dL — AB (ref 8.9–10.3)
CO2: 21 mmol/L — ABNORMAL LOW (ref 22–32)
CREATININE: 0.91 mg/dL (ref 0.61–1.24)
Chloride: 107 mmol/L (ref 101–111)
Glucose, Bld: 129 mg/dL — ABNORMAL HIGH (ref 65–99)
Potassium: 3.4 mmol/L — ABNORMAL LOW (ref 3.5–5.1)
SODIUM: 132 mmol/L — AB (ref 135–145)
TOTAL PROTEIN: 4.8 g/dL — AB (ref 6.5–8.1)
Total Bilirubin: 0.7 mg/dL (ref 0.3–1.2)

## 2016-03-14 LAB — MAGNESIUM: MAGNESIUM: 1.8 mg/dL (ref 1.7–2.4)

## 2016-03-14 LAB — LACTIC ACID, PLASMA: LACTIC ACID, VENOUS: 1.1 mmol/L (ref 0.5–1.9)

## 2016-03-14 MED ORDER — MUPIROCIN 2 % EX OINT
1.0000 "application " | TOPICAL_OINTMENT | Freq: Two times a day (BID) | CUTANEOUS | Status: AC
  Administered 2016-03-14 – 2016-03-19 (×10): 1 via NASAL
  Filled 2016-03-14 (×3): qty 22

## 2016-03-14 MED ORDER — CHLORHEXIDINE GLUCONATE CLOTH 2 % EX PADS
6.0000 | MEDICATED_PAD | Freq: Every day | CUTANEOUS | Status: AC
  Administered 2016-03-14 – 2016-03-18 (×5): 6 via TOPICAL

## 2016-03-14 MED ORDER — IPRATROPIUM-ALBUTEROL 0.5-2.5 (3) MG/3ML IN SOLN
3.0000 mL | Freq: Four times a day (QID) | RESPIRATORY_TRACT | Status: DC | PRN
  Administered 2016-03-15: 3 mL via RESPIRATORY_TRACT
  Filled 2016-03-14: qty 3

## 2016-03-14 MED ORDER — VANCOMYCIN HCL IN DEXTROSE 750-5 MG/150ML-% IV SOLN
750.0000 mg | Freq: Two times a day (BID) | INTRAVENOUS | Status: AC
  Administered 2016-03-14: 750 mg via INTRAVENOUS
  Filled 2016-03-14 (×2): qty 150

## 2016-03-14 MED ORDER — LOPERAMIDE HCL 2 MG PO CAPS: 2.0000 mg | ORAL_CAPSULE | Freq: Four times a day (QID) | ORAL | Status: DC

## 2016-03-14 MED ORDER — ENOXAPARIN SODIUM 40 MG/0.4ML ~~LOC~~ SOLN
40.0000 mg | SUBCUTANEOUS | Status: DC
  Administered 2016-03-14 – 2016-03-22 (×9): 40 mg via SUBCUTANEOUS
  Filled 2016-03-14 (×9): qty 0.4

## 2016-03-14 MED ORDER — LOPERAMIDE HCL 2 MG PO CAPS
2.0000 mg | ORAL_CAPSULE | Freq: Three times a day (TID) | ORAL | Status: DC
  Administered 2016-03-14 – 2016-03-18 (×13): 2 mg via ORAL
  Filled 2016-03-14 (×12): qty 1

## 2016-03-14 MED ORDER — DM-GUAIFENESIN ER 30-600 MG PO TB12: 1.0000 | ORAL_TABLET | Freq: Two times a day (BID) | ORAL | Status: DC | PRN

## 2016-03-14 MED ORDER — POTASSIUM CHLORIDE 10 MEQ/100ML IV SOLN
10.0000 meq | INTRAVENOUS | Status: AC
Start: 2016-03-14 — End: 2016-03-14
  Administered 2016-03-14 (×3): 10 meq via INTRAVENOUS
  Filled 2016-03-14 (×3): qty 100

## 2016-03-14 MED ORDER — SODIUM CHLORIDE 0.9 % IV SOLN
1000.0000 mL | INTRAVENOUS | Status: DC
  Administered 2016-03-14: 1000 mL via INTRAVENOUS

## 2016-03-14 NOTE — Progress Notes (Signed)
Received call from Infection prevention to discontinue Enteric but continue contact precautions given C-diff and GI panel negatives. Carried out.

## 2016-03-14 NOTE — Progress Notes (Signed)
Pharmacy Antibiotic Note  Barry Dean is a 82 y.o. male admitted on 03/12/2016 after being found unresponsive, pale and diaphortic. Since admission, creatine has improved 1.8 >> 0.91 and antibiotic dose adjustment is indicated.  Plan: -Cefepime 2g IV q24h -Vancomycin 750mg  every 12 hours -Monitor renal fx, cultures, VT as needed  Height: 6\' 1"  (185.4 cm) Weight: 209 lb 10.5 oz (95.1 kg) IBW/kg (Calculated) : 79.9  Temp (24hrs), Avg:98.5 F (36.9 C), Min:98 F (36.7 C), Max:99.5 F (37.5 C)   Recent Labs Lab 03/12/16 1154  03/12/16 1815 03/12/16 1950 03/12/16 2300 03/13/16 0319 03/13/16 2228 03/14/16 0030  WBC 8.6  --   --   --   --  12.4*  --  7.7  CREATININE 1.54*  --   --   --   --  1.41*  --  0.91  LATICACIDVEN  --   < > 2.96* 2.6* 2.1*  --  0.9 1.1  < > = values in this interval not displayed.  Estimated Creatinine Clearance: 61 mL/min (by C-G formula based on Cr of 0.91).    Allergies  Allergen Reactions  . Topamax Other (See Comments)    Unknown reaction  . Uroxatral [Alfuzosin Hydrochloride] Other (See Comments)    Unknown reaction  . Valium Other (See Comments)    Unknown reaction    Antimicrobials this admission: 7/17 vancomycin > 7/17 cefepime >   Dose adjustments this admission: 7/19: Vancomcyin 1250 Q24H changed to 750mg  every 12 hours  Microbiology results: 7/17 blood cx: 7/17 urine cx:   Thank you for allowing pharmacy to be a part of this patient's care.  Melburn Popper 03/14/2016 12:49 PM

## 2016-03-14 NOTE — Progress Notes (Signed)
  Echocardiogram 2D Echocardiogram has been performed.  Barry Dean 03/14/2016, 12:17 PM

## 2016-03-14 NOTE — Clinical Social Work Note (Signed)
CSW received consult that patient is from [facility], patient's daughter was at bedside and said that [facility] has a rehab unit at the facility.  CSW informed patient's daughter that CSW will send clinical information to [facility] who can review patient's information and determine if they can provide the appropriate level of care at ALF or if patient will have to go to SNF.  Patient's daughter stated if patient needs to go to SNF she is in agreement to having CSW begin bed search process.  CSW to fax out information to SNFs, assessment to be completed at a later time.  Jones Broom. [place], MSW, [place] 03/14/2016 6:24 PM

## 2016-03-14 NOTE — Progress Notes (Signed)
[place] TEAM 1 - Stepdown/ICU TEAM  Barry Dean  [id number] DOB: 1924-05-02 DOA: 03/12/2016 PCP: [name], MD   Brief Narrative:  82 y.o. M Hx Anxiety, Depression, Parkinson's, BPH, MV Regurg, HTN, A. Fib, and GERD who presented from her assisted living facility due to acute encephalopathy. Initial evaluation revealed acute respiratory failure with hypoxia, hypotension, acute kidney injury, elevated lactic acid, and chest x-ray was concerning for pneumonia.  Assessment & Plan:   SIRS v/s Sepsis of unknown etiology w/ Acute hypoxic resp failure due w/ lactic acidosis  -After further evaluation there is no compelling evidence to suggest a true pneumonia - discontinue antibiotics after 3 days of treatment - perhaps this was SIRS related to severe dehydration in the setting of ongoing watery diarrhea - I do suspect the patient likely had an episode of aspiration in the setting of recurrent vomiting but there is no evidence of a severe pneumonitis/pneumonia at present  Acute kidney injury -Cr 1.54 on admission - related to extreme dehydration in setting of nausea vomiting diarrhea - has resolved w/ volume expansion  Recent Labs Lab 03/12/16 1154 03/13/16 0319 03/14/16 0030  CREATININE 1.54* 1.41* 0.91    Acute encephalopathy -CT head without acute abnormalities - [id number] and folic acid normal - follow mental status with increasing activity  Watery Diarrhea -Rectal tube in place - C. difficile negative - GI pathogen panel negative - diarrhea persists - daughter at bedside reports this has been going on for "months" - trial of Imodium    Nausea and vomiting -Resolved  Hypokalemia  -Due to GI losses - supplement and follow - magnesium is okay  Hypertension -Blood pressure acceptable in this 82 year old patient  MRSA Screen +  DVT prophylaxis: Subcutaneous heparin Code Status: DO NOT RESUSCITATE Family Communication: Spoke with daughter at bedside at  length Disposition Plan: Resolution sepsis  Consultants:  none  Procedures:  TTE - 7/19 - EF 60-65% - no WMA - grade 2 DD  Antimicrobials: Cefepime7/17>> Vancomycin 7/17>>  Subjective: The patient is alert but not conversant.  He appears quite tired.  He rapidly falls asleep during my exam.  I spoke with his daughter at bedside at length.  The patient does not appear to be in acute respiratory distress nor does he appear to be uncomfortable.  Objective: Filed Vitals:   03/14/16 0859 03/14/16 1114 03/14/16 1549 03/14/16 1629  BP:  146/75  157/74  Pulse:  94    Temp:  98.2 F (36.8 C)  98 F (36.7 C)  TempSrc:  Oral  Oral  Resp:  24  22  Height:      Weight:      SpO2: 98% 96% 100% 97%    Intake/Output Summary (Last 24 hours) at 03/14/16 1650 Last data filed at 03/14/16 0900  Gross per 24 hour  Intake   2400 ml  Output    650 ml  Net   1750 ml   Filed Weights   03/12/16 1845 03/14/16 0500  Weight: 95.5 kg (210 lb 8.6 oz) 95.1 kg (209 lb 10.5 oz)    Examination: General: No acute respiratory distress Lungs: Clear to auscultation bilaterally without wheezes or crackles Cardiovascular: Regular rate and rhythm without murmur gallop or rub normal S1 and S2 Abdomen: Nontender, nondistended, soft, bowel sounds positive, no rebound, no ascites, no appreciable mass Extremities: No significant cyanosis, clubbing, or edema bilateral lower extremities   CBC:  Recent Labs Lab 03/12/16 1154 03/12/16 1950 03/13/16 0319 03/14/16 0030  WBC 8.6  --  12.4* 7.7  NEUTROABS 4.2  --   --  5.7  HGB 13.9  --  11.8* 9.7*  HCT 41.7 37.2* 35.3* 29.9*  MCV 89.3  --  89.1 89.3  PLT 335  --  262 [id number]   Basic Metabolic Panel:  Recent Labs Lab 03/12/16 1154 03/13/16 0319 03/14/16 0030  NA 132* 136 132*  K 3.5 4.3 3.4*  CL 100* 109 107  CO2 22 20* 21*  GLUCOSE 154* 129* 129*  BUN 14 21* 17  CREATININE 1.54* 1.41* 0.91  CALCIUM 9.7 8.1* 7.9*  MG  --   --  1.8    GFR: Estimated Creatinine Clearance: 61 mL/min (by C-G formula based on Cr of 0.91).   Liver Function Tests:  Recent Labs Lab 03/12/16 1154 03/13/16 0319 03/14/16 0030  AST 36 57* 83*  ALT 23 32 58  ALKPHOS 98 60 47  BILITOT 0.7 0.6 0.7  PROT 7.6 5.2* 4.8*  ALBUMIN 4.4 3.0* 2.5*   Anemia Panel:  Recent Labs  03/12/16 1950  VITAMINB12 7171*   Recent Results (from the past 240 hour(s))  Blood culture (routine x 2)     Status: None (Preliminary result)   Collection Time: 03/12/16 12:17 PM  Result Value Ref Range Status   Specimen Description BLOOD RIGHT ANTECUBITAL  Final   Special Requests BOTTLES DRAWN AEROBIC AND ANAEROBIC  5CC  Final   Culture NO GROWTH 2 DAYS  Final   Report Status PENDING  Incomplete  Blood culture (routine x 2)     Status: None (Preliminary result)   Collection Time: 03/12/16 12:31 PM  Result Value Ref Range Status   Specimen Description BLOOD LEFT HAND  Final   Special Requests BOTTLES DRAWN AEROBIC AND ANAEROBIC  5CC  Final   Culture NO GROWTH 2 DAYS  Final   Report Status PENDING  Incomplete  Urine culture     Status: None   Collection Time: 03/12/16  2:14 PM  Result Value Ref Range Status   Specimen Description URINE, RANDOM  Final   Special Requests NONE  Final   Culture NO GROWTH  Final   Report Status 03/13/2016 FINAL  Final  Gastrointestinal Panel by PCR , Stool     Status: None   Collection Time: 03/12/16  6:34 PM  Result Value Ref Range Status   Campylobacter species NOT DETECTED NOT DETECTED Final   Plesimonas shigelloides NOT DETECTED NOT DETECTED Final   Salmonella species NOT DETECTED NOT DETECTED Final   Yersinia enterocolitica NOT DETECTED NOT DETECTED Final   Vibrio species NOT DETECTED NOT DETECTED Final   Vibrio cholerae NOT DETECTED NOT DETECTED Final   Enteroaggregative E coli (EAEC) NOT DETECTED NOT DETECTED Final   Enteropathogenic E coli (EPEC) NOT DETECTED NOT DETECTED Final   Enterotoxigenic E coli (ETEC) NOT  DETECTED NOT DETECTED Final   Shiga like toxin producing E coli (STEC) NOT DETECTED NOT DETECTED Final   E. coli O157 NOT DETECTED NOT DETECTED Final   Shigella/Enteroinvasive E coli (EIEC) NOT DETECTED NOT DETECTED Final   Cryptosporidium NOT DETECTED NOT DETECTED Final   Cyclospora cayetanensis NOT DETECTED NOT DETECTED Final   Entamoeba histolytica NOT DETECTED NOT DETECTED Final   Giardia lamblia NOT DETECTED NOT DETECTED Final   Adenovirus F40/41 NOT DETECTED NOT DETECTED Final   Astrovirus NOT DETECTED NOT DETECTED Final   Norovirus GI/GII NOT DETECTED NOT DETECTED Final   Rotavirus A NOT DETECTED NOT DETECTED Final  Sapovirus (I, II, IV, and V) NOT DETECTED NOT DETECTED Final  MRSA PCR Screening     Status: Abnormal   Collection Time: 03/12/16  7:16 PM  Result Value Ref Range Status   MRSA by PCR POSITIVE (A) NEGATIVE Final    Comment:        The GeneXpert MRSA Assay (FDA approved for NASAL specimens only), is one component of a comprehensive MRSA colonization surveillance program. It is not intended to diagnose MRSA infection nor to guide or monitor treatment for MRSA infections. RESULT CALLED TO, READ BACK BY AND VERIFIED WITH: M [facility] RN [id number] 03/12/16 A BROWNING   C difficile quick scan w PCR reflex     Status: None   Collection Time: 03/13/16 11:14 AM  Result Value Ref Range Status   C Diff antigen NEGATIVE NEGATIVE Final   C Diff toxin NEGATIVE NEGATIVE Final   C Diff interpretation No C. difficile detected.  Final     Scheduled Meds: . aspirin  81 mg Oral QPM  . ceFEPime (MAXIPIME) IV  2 g Intravenous Q24H  . Chlorhexidine Gluconate Cloth  6 each Topical Q0600  . dextromethorphan-guaiFENesin  1 tablet Oral BID  . feeding supplement  1 Container Oral TID BM  . heparin  5,000 Units Subcutaneous Q8H  . ipratropium-albuterol  3 mL Nebulization TID  . lacosamide  150 mg Oral QHS  . lacosamide  50 mg Oral Daily  . lamoTRIgine  75 mg Oral BID  . metoprolol  5 mg  Intravenous Q8H  . mupirocin ointment  1 application Nasal BID  . pantoprazole  40 mg Oral Daily  . sodium chloride flush  3 mL Intravenous Q12H  . vancomycin  750 mg Intravenous Q12H     LOS: 2 days   Cherene Altes, MD [facility] Office  [phone] Pager - Text Page per Shea Evans as per below:  On-Call/Text Page:      [email or web address]      password [email or web address]  If 7PM-7AM, please contact night-coverage [email or web address] Password [facility] 03/14/2016, 4:51 PM

## 2016-03-14 NOTE — Progress Notes (Signed)
Speech Language Pathology Treatment: Dysphagia  Patient Details Name: Barry Dean MRN: [id number] DOB: [birth year] Today's Date: 03/14/2016 Time: 1120-1130 SLP Time Calculation (min) (ACUTE ONLY): 10 min  Assessment / Plan / Recommendation Clinical Impression  F/u after yesterday's MBS.  Pt's diet not yet advanced from clears.  He may advance to dysphagia 3 when deemed ready per MD.  Crackers and thin liquids provided with no overt s/s of aspiration.  Phonation wet-sounding upon entering room and prior to PO intake; lungs are clear but diminished.  Swallow function appears to be at baseline.  No further SLP f/u is warranted.  HPI HPI: Barry Dean is a 82 y.o. male with a Past Medical History of Parkinson's, HTN, SZR, deafness, reflux, A. fib, BPH, GERD, who presents with sepsis likely secondary to PNA (LLL HCAP vs aspiration). Patient presents from nursing home orders likely also picked up a viral gastroenteritis causing severe dehydration. BEdside swallow evaluations 03/29/14, 11/11/12 indicate normal swallowing function. MBS 07/20/11 with a mild pharyngeal dysphagia with recommendations for regular solids, thin liquids.      SLP Plan  All goals met     Recommendations  Diet recommendations: Dysphagia 3 (mechanical soft);Thin liquid Liquids provided via: Cup;Straw Medication Administration: Whole meds with liquid Supervision: Patient able to self feed Compensations: Slow rate;Small sips/bites Postural Changes and/or Swallow Maneuvers: Seated upright 90 degrees             Oral Care Recommendations: Oral care BID Follow up Recommendations: None Plan: All goals met     GO               Cully Luckow L. Tivis Ringer, [place] CCC/SLP Pager [phone]  [name] 03/14/2016, 12:08 PM

## 2016-03-15 ENCOUNTER — Inpatient Hospital Stay (HOSPITAL_COMMUNITY): Payer: Medicare Other

## 2016-03-15 DIAGNOSIS — E876 Hypokalemia: Secondary | ICD-10-CM | POA: Diagnosis present

## 2016-03-15 DIAGNOSIS — A419 Sepsis, unspecified organism: Secondary | ICD-10-CM | POA: Diagnosis present

## 2016-03-15 LAB — LACTATE DEHYDROGENASE: LDH: 192 U/L (ref 98–192)

## 2016-03-15 LAB — BLOOD GAS, ARTERIAL
Acid-base deficit: 0.2 mmol/L (ref 0.0–2.0)
Bicarbonate: 23.4 mEq/L (ref 20.0–24.0)
Drawn by: 24610
FIO2: 0.5
O2 SAT: 78.7 %
PO2 ART: 46.9 mmHg — AB (ref 80.0–100.0)
Patient temperature: 100.9
TCO2: 24.4 mmol/L (ref 0–100)
pCO2 arterial: 36.5 mmHg (ref 35.0–45.0)
pH, Arterial: 7.428 (ref 7.350–7.450)

## 2016-03-15 LAB — POCT I-STAT 3, ART BLOOD GAS (G3+)
Acid-Base Excess: 1 mmol/L (ref 0.0–2.0)
BICARBONATE: 17.8 meq/L — AB (ref 20.0–24.0)
O2 Saturation: 97 %
TCO2: 18 mmol/L (ref 0–100)
pCO2 arterial: 14 mmHg — CL (ref 35.0–45.0)
pH, Arterial: 7.716 (ref 7.350–7.450)
pO2, Arterial: 66 mmHg — ABNORMAL LOW (ref 80.0–100.0)

## 2016-03-15 LAB — COMPREHENSIVE METABOLIC PANEL
ALT: 84 U/L — ABNORMAL HIGH (ref 17–63)
AST: 87 U/L — AB (ref 15–41)
Albumin: 2.7 g/dL — ABNORMAL LOW (ref 3.5–5.0)
Alkaline Phosphatase: 58 U/L (ref 38–126)
Anion gap: 5 (ref 5–15)
BILIRUBIN TOTAL: 0.8 mg/dL (ref 0.3–1.2)
BUN: 8 mg/dL (ref 6–20)
CO2: 23 mmol/L (ref 22–32)
Calcium: 7.8 mg/dL — ABNORMAL LOW (ref 8.9–10.3)
Chloride: 103 mmol/L (ref 101–111)
Creatinine, Ser: 0.75 mg/dL (ref 0.61–1.24)
GFR calc Af Amer: >60 mL/min (ref >60)
Glucose, Bld: 85 mg/dL (ref 65–99)
POTASSIUM: 3.1 mmol/L — AB (ref 3.5–5.1)
Sodium: 131 mmol/L — ABNORMAL LOW (ref 135–145)
TOTAL PROTEIN: 4.9 g/dL — AB (ref 6.5–8.1)

## 2016-03-15 LAB — MAGNESIUM
MAGNESIUM: 2 mg/dL (ref 1.7–2.4)
Magnesium: 1.6 mg/dL — ABNORMAL LOW (ref 1.7–2.4)

## 2016-03-15 LAB — CBC
HEMATOCRIT: 29.9 % — AB (ref 39.0–52.0)
Hemoglobin: 10.1 g/dL — ABNORMAL LOW (ref 13.0–17.0)
MCH: 29.5 pg (ref 26.0–34.0)
MCHC: 33.8 g/dL (ref 30.0–36.0)
MCV: 87.4 fL (ref 78.0–100.0)
Platelets: 208 10*3/uL (ref 150–400)
RBC: 3.42 MIL/uL — ABNORMAL LOW (ref 4.22–5.81)
RDW: 13.3 % (ref 11.5–15.5)
WBC: 8.2 10*3/uL (ref 4.0–10.5)

## 2016-03-15 LAB — TSH: TSH: 2.623 u[IU]/mL (ref 0.350–4.500)

## 2016-03-15 LAB — POTASSIUM
POTASSIUM: 3.3 mmol/L — AB (ref 3.5–5.1)
POTASSIUM: 3.1 mmol/L — AB (ref 3.5–5.1)

## 2016-03-15 MED ORDER — SODIUM CHLORIDE 0.9 % IV SOLN: 1000.0000 mL | INTRAVENOUS | Status: DC

## 2016-03-15 MED ORDER — FUROSEMIDE 10 MG/ML IJ SOLN
60.0000 mg | Freq: Once | INTRAMUSCULAR | Status: AC
  Administered 2016-03-15: 60 mg via INTRAVENOUS
  Filled 2016-03-15: qty 6

## 2016-03-15 MED ORDER — POTASSIUM CHLORIDE 10 MEQ/100ML IV SOLN
10.0000 meq | INTRAVENOUS | Status: AC
  Administered 2016-03-15 (×5): 10 meq via INTRAVENOUS
  Filled 2016-03-15 (×5): qty 100

## 2016-03-15 MED ORDER — MAGNESIUM SULFATE 50 % IJ SOLN
3.0000 g | Freq: Once | INTRAVENOUS | Status: AC
  Administered 2016-03-15: 3 g via INTRAVENOUS
  Filled 2016-03-15: qty 6

## 2016-03-15 MED ORDER — POTASSIUM CHLORIDE 10 MEQ/100ML IV SOLN
10.0000 meq | INTRAVENOUS | Status: AC
  Administered 2016-03-15 (×4): 10 meq via INTRAVENOUS
  Filled 2016-03-15 (×4): qty 100

## 2016-03-15 NOTE — Progress Notes (Signed)
Dr. Sherral Hammers notified that abg completed and results on chart.  Will continue to monitor pt closely.

## 2016-03-15 NOTE — [place] FL2 (Signed)
[place] MEDICAID FL2 LEVEL OF CARE SCREENING TOOL     IDENTIFICATION  Patient Name: Barry Dean Birthdate: 10-26-1923 Sex: male Admission Date (Current Location): 03/12/2016  [facility] and [place] Number:  Herbalist and Address:  [facility], [place] [address], [place], [place] [address]      Provider Number: [id number]  Attending Physician Name and Address:  Allie Bossier, MD  Relative Name and Phone Number:  Schowalter,Susan [phone] or [phone]    Current Level of Care: Hospital Recommended Level of Care: [place] Prior Approval Number:    Date Approved/Denied:   PASRR Number: [id number]  Discharge Plan: SNF    Current Diagnoses: Patient Active Problem List   Diagnosis Date Noted  . Sepsis due to pneumonia ([place])   . Sepsis ([place]) 03/12/2016  . Nausea & vomiting 03/12/2016  . Diarrhea 03/12/2016  . Acute encephalopathy 03/12/2016  . Acute kidney injury ([place]) 03/12/2016  . Acute respiratory failure with hypoxia ([place]) 03/12/2016  . Septic shock ([place])   . Abnormality of gait 09/27/2015  . Tremor 09/27/2015  . Undiagnosed cardiac murmurs 03/06/2015  . Syncope and collapse 03/05/2015  . Parkinson's disease ([place]) 03/05/2015  . PAF (paroxysmal atrial fibrillation) ([place]) 03/05/2015  . Bradycardia 03/05/2015  . Deafness 03/05/2015  . Mild mitral regurgitation 03/05/2015  . Emesis, persistent 09/14/2014  . Benign essential HTN 09/14/2014  . Syncope 09/13/2013  . ?? Respiratory failure 09/13/2013  . Noninfectious gastroenteritis and colitis 11/12/2012  . Acute and chronic respiratory failure 11/12/2012  . Community acquired pneumonia 11/12/2012  . HCAP (healthcare-associated pneumonia) 07/18/2011  . Seizure disorder ([employer]) 07/18/2011  . HTN (hypertension) 07/18/2011  . Hyponatremia 07/18/2011    Orientation RESPIRATION BLADDER Height & Weight        O2 (3 L ) Incontinent Weight: 216 lb 4.3 oz  (98.1 kg) Height:  6\' 1"  (185.4 cm)  BEHAVIORAL SYMPTOMS/MOOD NEUROLOGICAL BOWEL NUTRITION STATUS    Convulsions/Seizures Incontinent Diet (Dysphagia 3)  AMBULATORY STATUS COMMUNICATION OF NEEDS Skin   Extensive Assist Verbally Normal                       Personal Care Assistance Level of Assistance  Dressing, Bathing, Feeding Bathing Assistance: Maximum assistance Feeding assistance: Limited assistance Dressing Assistance: Maximum assistance     Functional Limitations Info  Hearing, Speech, Sight Sight Info: Adequate Hearing Info: Impaired Speech Info: Adequate    SPECIAL CARE FACTORS FREQUENCY  PT (By licensed PT), OT (By licensed OT), Speech therapy     PT Frequency: 5x a week OT Frequency: 5x a week            Contractures Contractures Info: Not present    Additional Factors Info  Code Status, Allergies Code Status Info: DNR Allergies Info: TOPAMAX, UROXATRAL, VALIUM           Current Medications (03/15/2016):  This is the current hospital active medication list Current Facility-Administered Medications  Medication Dose Route Frequency Provider Last Rate Last Dose  . 0.9 %  sodium chloride infusion  1,000 mL Intravenous Continuous Cherene Altes, MD 50 mL/hr at 03/14/16 1829 1,000 mL at 03/14/16 1829  . acetaminophen (TYLENOL) tablet 650 mg  650 mg Oral Q6H PRN Radene Gunning, NP   650 mg at 03/15/16 1009   Or  . acetaminophen (TYLENOL) suppository 650 mg  650 mg Rectal Q6H PRN Radene Gunning, NP      .  aspirin chewable tablet 81 mg  81 mg Oral QPM Radene Gunning, NP   81 mg at 03/14/16 1830  . Chlorhexidine Gluconate Cloth 2 % PADS 6 each  6 each Topical Q0600 Cherene Altes, MD   6 each at 03/15/16 0600  . dextromethorphan-guaiFENesin (MUCINEX DM) 30-600 MG per 12 hr tablet 1 tablet  1 tablet Oral BID PRN Cherene Altes, MD      . enoxaparin (LOVENOX) injection 40 mg  40 mg Subcutaneous Q24H Cherene Altes, MD   40 mg at 03/14/16 1835  .  feeding supplement (BOOST / RESOURCE BREEZE) liquid 1 Container  1 Container Oral TID BM Allie Bossier, MD   1 Container at 03/15/16 1000  . ipratropium-albuterol (DUONEB) 0.5-2.5 (3) MG/3ML nebulizer solution 3 mL  3 mL Nebulization Q6H PRN Cherene Altes, MD      . lacosamide (VIMPAT) tablet 150 mg  150 mg Oral QHS Radene Gunning, NP   150 mg at 03/14/16 2111  . lacosamide (VIMPAT) tablet 50 mg  50 mg Oral Daily Radene Gunning, NP   50 mg at 03/15/16 1009  . lamoTRIgine (LAMICTAL) tablet 75 mg  75 mg Oral BID Radene Gunning, NP   75 mg at 03/15/16 1011  . loperamide (IMODIUM) capsule 2 mg  2 mg Oral Q8H Cherene Altes, MD   2 mg at 03/15/16 0527  . mupirocin ointment (BACTROBAN) 2 % 1 application  1 application Nasal BID Cherene Altes, MD   1 application at [phone]  . pantoprazole (PROTONIX) EC tablet 40 mg  40 mg Oral Daily Lezlie Octave Black, NP   40 mg at 03/15/16 1009  . sodium chloride flush (NS) 0.9 % injection 3 mL  3 mL Intravenous Q12H Radene Gunning, NP   3 mL at 03/15/16 1010     Discharge Medications: Please see discharge summary for a list of discharge medications.  Relevant Imaging Results:  Relevant Lab Results:   Additional Information SSN [id number]  Ross Ludwig, [place]

## 2016-03-15 NOTE — Progress Notes (Signed)
Dr. Sherral Hammers notified of pt continuing to [place]. Oxygen probe changed, oxygen titrated up to 6L via nasal cannula. Pt awake, alert, answering questions appropriately. Respiratory therapist has given neb treatment. Orders received.  Will continue to monitor pt closely.

## 2016-03-15 NOTE — Clinical Social Work Note (Signed)
Clinical Social Work Assessment  Patient Details  Name: Barry Dean MRN: [id number] Date of Birth: 1924-07-04  Date of referral:  03/15/16               Reason for consult:  Facility Placement                Permission sought to share information with:  Facility Sport and exercise psychologist, Family Supports Permission granted to share information::  Yes, Verbal Permission Granted  Name::     Toral,Susan Daughter [phone] or [phone]  Agency::  SNF admissions  Relationship::     Contact Information:     Housing/Transportation Living arrangements for the past 2 months:  [place] of Information:  Adult Children Patient Interpreter Needed:  None Criminal Activity/Legal Involvement Pertinent to Current Situation/Hospitalization:  No - Comment as needed Significant Relationships:  Adult Children Lives with:  Facility Resident Do you feel safe going back to the place where you live?  Yes Need for family participation in patient care:  Yes (Comment) (Patient has some dementia and Parkinson's daughter helps with decison making.)  Care giving concerns:  Patient's daughter would like patient to return to [facility] to get therapy if he can but is willing to have patient go to SNF if he needs to.   Social Worker assessment / plan:  Patient was lethargic and not really wanting to speak with CSW, CSW completed assessment by speaking to patient's daughter who was at bedside.  Patient is a 82 year old male who lives at [facility], and he is alert and oriented x1.  Patient's daughter states that patient's health has deteriorated recently and she is concerned patient may need to go to skilled nursing facility.  Patient needs assistance with most of his ADLs and sometimes needs assistance with feeding too.  Patient's daughter reports that patient has been to rehab in the past and she is open to having patient go rehab again if his needs can not be met at ALF.   Patient's daughter expressed she is familiar with the process of going to SNF for rehab and expressed she did not have any other questions.  Employment status:  Retired Forensic scientist:  Medicare PT Recommendations:  [place] / Referral to community resources:  [place]  Patient/Family's Response to care:  Patient's daughter in agreement to having patient go to SNF for rehab.  Patient/Family's Understanding of and Emotional Response to Diagnosis, Current Treatment, and Prognosis:  Patient was sleeping and was not able to give an appropriate response to diagnosis, prognosis or treatment plan.  Patient's daughter is concerned that patient may have to move to SNF as a long term care patient.  Emotional Assessment Appearance:  Appears stated age Attitude/Demeanor/Rapport:    Affect (typically observed):  Appropriate, Calm Orientation:  Oriented to Self Alcohol / Substance use:  Not Applicable Psych involvement (Current and /or in the community):  No (Comment)  Discharge Needs  Concerns to be addressed:  No discharge needs identified Readmission within the last 30 days:  No Current discharge risk:  None Barriers to Discharge:  Continued Medical Work up   Anell Barr 03/15/2016, 6:12 PM

## 2016-03-15 NOTE — Progress Notes (Addendum)
PROGRESS NOTE    Barry Dean  [id number] DOB: 1924-07-12 DOA: 03/12/2016 PCP: [name], MD   Brief Narrative:  82 y.o. WM PMHx Anxiety, Depression Parkinson's, BPH, MV Regurg, HTN, A. fib, , GERD   Presents to the emergency department from assisted living facility with the chief complaint acute encephalopathy. Initial evaluation reveals acute respiratory failure with hypoxia, hypotension, acute kidney injury, elevated lactic acid, chest x-ray concerning for pneumonia.  Information is obtained from the chart and the staff this patient is too hard of hearing and ill to participate. Patient was found sitting in a wheelchair slumped over noted to be unresponsive to verbal stimuli. He was described as pale and diaphoretic. Reportedly he has had several episodes of vomiting and diarrhea at the facility. No reports of any complaints of chest pain shortness of breath cough fever chills. Patient very hard of hearing but denies pain at this time.   Assessment & Plan:   Principal Problem:   Sepsis due to pneumonia [name]) Active Problems:   HCAP (healthcare-associated pneumonia)   Seizure disorder ([place])   Hyponatremia   Benign essential HTN   Parkinson's disease ([place])   PAF (paroxysmal atrial fibrillation) (H[facility])   Sepsis ([place])   Nausea & vomiting   Diarrhea   Acute encephalopathy   Acute kidney injury ([place])   Acute respiratory failure with hypoxia ([facility])   Sepsis, unspecified organism ([place])   Hypokalemia   Hypomagnesemia   Sepsis unspecified organism. HCAP Pneumonia. -Normal saline 56ml/hr -Continue broad-spectrum antibiotics -Track lactic acid -DuoNeb TID -Flutter valve -Recent DM BID  Acute respiratory failure with hypoxia.  -Resolved  -Titrate O2 to maintain SPO2 > 92%   Chronic diastolic CHF -Strict in and out since admission +9.9 L -Daily weight Filed Weights   03/12/16 1845 03/14/16 0500 03/15/16 0500  Weight: 95.5 kg (210 lb 8.6 oz) 95.1 kg (209 lb  10.5 oz) 98.1 kg (216 lb 4.3 oz)   Pulmonary edema -Lasix 60 mg 1: Gently diuresis -IV fluid to KVO  Acute kidney injury(Cr1.54 on admission.) -. Likely related to extreme dehydration in setting of nausea vomiting extreme diarrhea. -IV fluids as noted above Lab Results  Component Value Date   CREATININE 0.75 03/15/2016   CREATININE 0.91 03/14/2016   CREATININE 1.41* 03/13/2016  -Hold nephrotoxins   Acute encephalopathy.  -CT of the head without acute abnormalities.  -Resolved  -very hard of hearing  Diarrhea. Watery  -Rectal tube in place  -C. difficile negative  -GI pathogen panel Negative  Nausea and vomiting -Resolved  Hypertension.   Hypokalemia -Potassium 40 mEq: Repeat K low -Potassium IV 50 mEq: Repeat K/Mg @2200   Hypomagnesemia -Magnesium IV 3 gm      DVT prophylaxis: Subcutaneous heparin Code Status: DO NOT RESUSCITATE Family Communication: None Disposition Plan: Resolution sepsis   Consultants:  None  Procedures/Significant Events:  7/19 echocardiogram:- Left ventricle: moderate LVH. Severe focal basal hypertrophy of the septum.  -LVEF= 60% to 65%.- (grade 2 diastolic dysfunction). - Left atrium: Moderately dilated. - Right ventricle: mildly dilated. - Right atrium:  mildly dilated.   Cultures 7/17 blood right AC/left hand NGTD 7/17 MRSA by PCR positive 7/17 urine negative 7/17 strep neuro urine antigen negative 7/18 C. difficile negative 7/18 GI panel pending   Antimicrobials: Cefepime7/17>> Vancomycin 7/17>>   Devices    LINES / TUBES:      Continuous Infusions: . sodium chloride       Subjective: 7/20 MAXIMUM TEMPERATURE in last 24 hours 38.1 C  A/O 3 (does not know why), Sitting in chair comfortably     Objective: Filed Vitals:   03/15/16 1745 03/15/16 1750 03/15/16 1753 03/15/16 1755  BP:      Pulse: 113 116  112  Temp:      TempSrc:      Resp: 24 23  23   Height:      Weight:      SpO2: 96% 98% 88%  97%    Intake/Output Summary (Last 24 hours) at 03/15/16 1842 Last data filed at 03/15/16 1600  Gross per 24 hour  Intake 2015.83 ml  Output    300 ml  Net 1715.83 ml   Filed Weights   03/12/16 1845 03/14/16 0500 03/15/16 0500  Weight: 95.5 kg (210 lb 8.6 oz) 95.1 kg (209 lb 10.5 oz) 98.1 kg (216 lb 4.3 oz)    Examination:  General: A/O 3 (does not know why),No acute respiratory distress Eyes: negative scleral hemorrhage, negative anisocoria, negative icterus ENT: Negative Runny nose, negative gingival bleeding, Neck:  Negative scars, masses, torticollis, lymphadenopathy, JVD Lungs: Clear to auscultation bilaterally without wheezes or crackles Cardiovascular: Regular rate and rhythm without murmur gallop or rub normal S1 and S2 Abdomen: negative abdominal pain, nondistended, positive soft, bowel sounds, no rebound, no ascites, no appreciable mass Extremities: No significant cyanosis, clubbing, or edema bilateral lower extremities Skin: Negative rashes, lesions, ulcers Psychiatric:  Negative depression, negative anxiety, negative fatigue, negative mania  Central nervous system:  Cranial nerves II through XII intact, tongue/uvula midline, all extremities muscle strength 5/5, sensation intact throughout, negative dysarthria, negative expressive aphasia, negative receptive aphasia.  .     Data Reviewed: Care during the described time interval was provided by me .  I have reviewed this patient's available data, including medical history, events of note, physical examination, and all test results as part of my evaluation. I have personally reviewed and interpreted all radiology studies.  CBC:  Recent Labs Lab 03/12/16 1154 03/12/16 1950 03/13/16 0319 03/14/16 0030 03/15/16 0331  WBC 8.6  --  12.4* 7.7 8.2  NEUTROABS 4.2  --   --  5.7  --   HGB 13.9  --  11.8* 9.7* 10.1*  HCT 41.7 37.2* 35.3* 29.9* 29.9*  MCV 89.3  --  89.1 89.3 87.4  PLT 335  --  262 185 [id number]   Basic  Metabolic Panel:  Recent Labs Lab 03/12/16 1154 03/13/16 0319 03/14/16 0030 03/15/16 0331 03/15/16 1505  NA 132* 136 132* 131*  --   K 3.5 4.3 3.4* 3.1* 3.3*  CL 100* 109 107 103  --   CO2 22 20* 21* 23  --   GLUCOSE 154* 129* 129* 85  --   BUN 14 21* 17 8  --   CREATININE 1.54* 1.41* 0.91 0.75  --   CALCIUM 9.7 8.1* 7.9* 7.8*  --   MG  --   --  1.8  --  1.6*   GFR: Estimated Creatinine Clearance: 75.7 mL/min (by C-G formula based on Cr of 0.75). Liver Function Tests:  Recent Labs Lab 03/12/16 1154 03/13/16 0319 03/14/16 0030 03/15/16 0331  AST 36 57* 83* 87*  ALT 23 32 58 84*  ALKPHOS 98 60 47 58  BILITOT 0.7 0.6 0.7 0.8  PROT 7.6 5.2* 4.8* 4.9*  ALBUMIN 4.4 3.0* 2.5* 2.7*   No results for input(s): LIPASE, AMYLASE in the last 168 hours. No results for input(s): AMMONIA in the last 168 hours. Coagulation Profile: No results for input(s):  INR, PROTIME in the last 168 hours. Cardiac Enzymes: No results for input(s): CKTOTAL, CKMB, CKMBINDEX, TROPONINI in the last 168 hours. BNP (last 3 results) No results for input(s): PROBNP in the last 8760 hours. HbA1C: No results for input(s): HGBA1C in the last 72 hours. CBG: No results for input(s): GLUCAP in the last 168 hours. Lipid Profile: No results for input(s): CHOL, HDL, LDLCALC, TRIG, CHOLHDL, LDLDIRECT in the last 72 hours. Thyroid Function Tests:  Recent Labs  03/15/16 0331  TSH 2.623   Anemia Panel:  Recent Labs  03/12/16 1950  VITAMINB12 7171*   Urine analysis:    Component Value Date/Time   COLORURINE YELLOW 03/12/2016 1414   APPEARANCEUR CLEAR 03/12/2016 1414   LABSPEC 1.018 03/12/2016 1414   PHURINE 6.5 03/12/2016 1414   GLUCOSEU NEGATIVE 03/12/2016 1414   HGBUR NEGATIVE 03/12/2016 1414   BILIRUBINUR NEGATIVE 03/12/2016 1414   KETONESUR NEGATIVE 03/12/2016 1414   PROTEINUR NEGATIVE 03/12/2016 1414   UROBILINOGEN 1.0 11/12/2014 1132   NITRITE NEGATIVE 03/12/2016 1414   LEUKOCYTESUR  NEGATIVE 03/12/2016 1414   Sepsis Labs: @LABRCNTIP (procalcitonin:4,lacticidven:4)  ) Recent Results (from the past 240 hour(s))  Blood culture (routine x 2)     Status: None (Preliminary result)   Collection Time: 03/12/16 12:17 PM  Result Value Ref Range Status   Specimen Description BLOOD RIGHT ANTECUBITAL  Final   Special Requests BOTTLES DRAWN AEROBIC AND ANAEROBIC  5CC  Final   Culture NO GROWTH 3 DAYS  Final   Report Status PENDING  Incomplete  Blood culture (routine x 2)     Status: None (Preliminary result)   Collection Time: 03/12/16 12:31 PM  Result Value Ref Range Status   Specimen Description BLOOD LEFT HAND  Final   Special Requests BOTTLES DRAWN AEROBIC AND ANAEROBIC  5CC  Final   Culture NO GROWTH 3 DAYS  Final   Report Status PENDING  Incomplete  Urine culture     Status: None   Collection Time: 03/12/16  2:14 PM  Result Value Ref Range Status   Specimen Description URINE, RANDOM  Final   Special Requests NONE  Final   Culture NO GROWTH  Final   Report Status 03/13/2016 FINAL  Final  Gastrointestinal Panel by PCR , Stool     Status: None   Collection Time: 03/12/16  6:34 PM  Result Value Ref Range Status   Campylobacter species NOT DETECTED NOT DETECTED Final   Plesimonas shigelloides NOT DETECTED NOT DETECTED Final   Salmonella species NOT DETECTED NOT DETECTED Final   Yersinia enterocolitica NOT DETECTED NOT DETECTED Final   Vibrio species NOT DETECTED NOT DETECTED Final   Vibrio cholerae NOT DETECTED NOT DETECTED Final   Enteroaggregative E coli (EAEC) NOT DETECTED NOT DETECTED Final   Enteropathogenic E coli (EPEC) NOT DETECTED NOT DETECTED Final   Enterotoxigenic E coli (ETEC) NOT DETECTED NOT DETECTED Final   Shiga like toxin producing E coli (STEC) NOT DETECTED NOT DETECTED Final   E. coli O157 NOT DETECTED NOT DETECTED Final   Shigella/Enteroinvasive E coli (EIEC) NOT DETECTED NOT DETECTED Final   Cryptosporidium NOT DETECTED NOT DETECTED Final    Cyclospora cayetanensis NOT DETECTED NOT DETECTED Final   Entamoeba histolytica NOT DETECTED NOT DETECTED Final   Giardia lamblia NOT DETECTED NOT DETECTED Final   Adenovirus F40/41 NOT DETECTED NOT DETECTED Final   Astrovirus NOT DETECTED NOT DETECTED Final   Norovirus GI/GII NOT DETECTED NOT DETECTED Final   Rotavirus A NOT DETECTED NOT DETECTED Final  Sapovirus (I, II, IV, and V) NOT DETECTED NOT DETECTED Final  MRSA PCR Screening     Status: Abnormal   Collection Time: 03/12/16  7:16 PM  Result Value Ref Range Status   MRSA by PCR POSITIVE (A) NEGATIVE Final    Comment:        The GeneXpert MRSA Assay (FDA approved for NASAL specimens only), is one component of a comprehensive MRSA colonization surveillance program. It is not intended to diagnose MRSA infection nor to guide or monitor treatment for MRSA infections. RESULT CALLED TO, READ BACK BY AND VERIFIED WITH: M [facility] RN [id number] 03/12/16 A BROWNING   C difficile quick scan w PCR reflex     Status: None   Collection Time: 03/13/16 11:14 AM  Result Value Ref Range Status   C Diff antigen NEGATIVE NEGATIVE Final   C Diff toxin NEGATIVE NEGATIVE Final   C Diff interpretation No C. difficile detected.  Final         Radiology Studies: No results found.      Scheduled Meds: . aspirin  81 mg Oral QPM  . Chlorhexidine Gluconate Cloth  6 each Topical Q0600  . enoxaparin (LOVENOX) injection  40 mg Subcutaneous Q24H  . feeding supplement  1 Container Oral TID BM  . furosemide  60 mg Intravenous Once  . lacosamide  150 mg Oral QHS  . lacosamide  50 mg Oral Daily  . lamoTRIgine  75 mg Oral BID  . loperamide  2 mg Oral Q8H  . magnesium sulfate 1 - 4 g bolus IVPB  3 g Intravenous Once  . mupirocin ointment  1 application Nasal BID  . pantoprazole  40 mg Oral Daily  . potassium chloride  10 mEq Intravenous Q1 Hr x 5  . sodium chloride flush  3 mL Intravenous Q12H   Continuous Infusions: . sodium chloride       LOS:  3 days    Time spent: 40 minutes    Barry Dean, Barry Docker, MD [facility] Pager [phone]   If 7PM-7AM, please contact night-coverage [email or web address] Password [facility] 03/15/2016, 6:42 PM

## 2016-03-15 NOTE — Clinical Social Work Placement (Signed)
   CLINICAL SOCIAL WORK PLACEMENT  NOTE  Date:  03/15/2016  Patient Details  Name: Barry Dean MRN: [id number] Date of Birth: Jul 08, 1924  Clinical Social Work is seeking post-discharge placement for this patient at the [place] level of care (*CSW will initial, date and re-position this form in  chart as items are completed):  Yes   Patient/family provided with [place] Work Department's list of facilities offering this level of care within the geographic area requested by the patient (or if unable, by the patient's family).  Yes   Patient/family informed of their freedom to choose among providers that offer the needed level of care, that participate in Medicare, Medicaid or managed care program needed by the patient, have an available bed and are willing to accept the patient.  Yes   Patient/family informed of [facility]'s ownership interest in [facility] and [facility], as well as of the fact that they are under no obligation to receive care at these facilities.  PASRR submitted to EDS on 03/15/16     PASRR number received on       Existing PASRR number confirmed on 03/15/16     FL2 transmitted to all facilities in geographic area requested by pt/family on 03/14/16     FL2 transmitted to all facilities within larger geographic area on       Patient informed that his/her managed care company has contracts with or will negotiate with certain facilities, including the following:            Patient/family informed of bed offers received.  Patient chooses bed at       Physician recommends and patient chooses bed at      Patient to be transferred to   on  .  Patient to be transferred to facility by       Patient family notified on   of transfer.  Name of family member notified:        PHYSICIAN Please sign FL2     Additional Comment:    _______________________________________________ Ross Ludwig, LCSWA 03/15/2016,  6:18 PM

## 2016-03-15 NOTE — Evaluation (Signed)
Occupational Therapy Evaluation Patient Details Name: Barry Dean MRN: [id number] DOB: Dec 29, 1923 Today's Date: 03/15/2016    History of Present Illness Barry Dean is a 82 y.o. male with a Past Medical History of Parkinson's, HTN, SZR, deafness, reflux, A. fib, BPH, GERD, who presents with sepsis likely secondary to PNA (LLL HCAP vs aspiration). Patient presents from nursing home orders likely also picked up a viral gastroenteritis causing severe dehydration.    Clinical Impression   Pt admitted with above. He demonstrates the below listed deficits and will benefit from continued OT to maximize safety and independence with BADLs.  Pt requires max A for ADLs and mod A for functional transfers.  He will require continued rehab at discharge.  Recommend ALF with HHOT/PT IF ALF able to meet his care needs, or SNF if they are not.  Will follow acutely.       Follow Up Recommendations  SNF (vs. HH at ALF )    Equipment Recommendations  None recommended by OT    Recommendations for Other Services       Precautions / Restrictions Precautions Precautions: Fall;Other (comment) Precaution Comments: h/o seizures      Mobility Bed Mobility Overal bed mobility: Needs Assistance Bed Mobility: Supine to Sit     Supine to sit: Mod assist     General bed mobility comments: Pt requires assist to lift trunk from bed and to scoot hips to EOB   Transfers Overall transfer level: Needs assistance   Transfers: Sit to/from Stand;Stand Pivot Transfers Sit to Stand: Mod assist;+2 safety/equipment Stand pivot transfers: Mod assist       General transfer comment: Pt requires mod A to move sit to stand from bed, but second person for safety when standing from chair as pt with greater difficulty rising from low surface.  He requires facilitation for anterior translation of trunk when transitioning sit to stand     Balance Overall balance assessment: Needs assistance Sitting-balance  support: Feet supported Sitting balance-Leahy Scale: Poor Sitting balance - Comments: Pt initially requires min guard assist, but progressed to mod A as he fatigued with EOB sitting.  He demonstrate posterior and Rt bias  Postural control: Posterior lean;Right lateral lean                                  ADL Overall ADL's : Needs assistance/impaired Eating/Feeding: Set up;Bed level;Sitting   Grooming: Wash/dry hands;Wash/dry face;Set up;Sitting   Upper Body Bathing: Maximal assistance;Sitting;Bed level   Lower Body Bathing: Maximal assistance;Bed level;Sit to/from stand   Upper Body Dressing : Maximal assistance;Sitting   Lower Body Dressing: Total assistance;Sit to/from stand   Toilet Transfer: Moderate assistance;+2 for safety/equipment;Stand-pivot;BSC   Toileting- Clothing Manipulation and Hygiene: Total assistance;Sit to/from stand       Functional mobility during ADLs: Moderate assistance;+2 for safety/equipment General ADL Comments: Pt moves slowly, and is slow to initiate movement and activity.  Truncal rigidity noted     Vision     Perception     Praxis      Pertinent Vitals/Pain Pain Assessment: No/denies pain     Hand Dominance Left   Extremity/Trunk Assessment Upper Extremity Assessment Upper Extremity Assessment: Generalized weakness   Lower Extremity Assessment Lower Extremity Assessment: Generalized weakness   Cervical / Trunk Assessment Cervical / Trunk Assessment: Kyphotic;Other exceptions Cervical / Trunk Exceptions: Pt with truncal rigidity    Communication Communication Communication: HOH;Other (comment) (has  bil. hearing aids )   Cognition Arousal/Alertness: Awake/alert Behavior During Therapy: Flat affect Overall Cognitive Status: Difficult to assess                     General Comments       Exercises       Shoulder Instructions      Home Living Family/patient expects to be discharged to:: Assisted  living                                 Additional Comments: ALF vs SNF - Pt resided at [facility]       Prior Functioning/Environment Level of Independence: Needs assistance  Gait / Transfers Assistance Needed: Pt has not been ambulated for the past few months.  Per daughter the MD told him it was more safe for him not to ambulate due to his frequent falls.  He uses his LEs to propel his WC for short distances, otherwise needs assist.  Pt pivots to [place] with RW and assist. ADL's / Homemaking Assistance Needed: Needs assist with bathing, dressing.  Ind with feeding per daughter.        OT Diagnosis: Generalized weakness;Cognitive deficits   OT Problem List: Decreased strength;Impaired balance (sitting and/or standing);Decreased cognition;Decreased safety awareness;Decreased knowledge of use of DME or AE   OT Treatment/Interventions: Self-care/ADL training;Neuromuscular education;DME and/or AE instruction;Therapeutic activities;Patient/family education;Balance training    OT Goals(Current goals can be found in the care plan section) Acute Rehab OT Goals Patient Stated Goal: none stated OT Goal Formulation: With patient Time For Goal Achievement: 03/29/16 Potential to Achieve Goals: Good ADL Goals Pt Will Perform Grooming: with supervision;sitting Pt Will Perform Upper Body Bathing: with min assist;sitting Pt Will Transfer to Toilet: with min assist;stand pivot transfer;bedside commode  OT Frequency: Min 2X/week   Barriers to D/C: Decreased caregiver support          Co-evaluation              End of Session Equipment Utilized During Treatment: Oxygen Nurse Communication: Mobility status  Activity Tolerance: Patient tolerated treatment well Patient left: in chair;with call bell/phone within reach;with chair alarm set   Time: [id number] OT Time Calculation (min): 29 min Charges:  OT General Charges $OT Visit: 1 Procedure OT Evaluation $OT Eval  Moderate Complexity: 1 Procedure OT Treatments $Therapeutic Activity: 8-22 mins G-Codes:    Priscilla Kirstein M 03-29-2016, 12:31 PM

## 2016-03-15 NOTE — Progress Notes (Signed)
Physical Therapy Treatment Patient Details Name: Barry Dean MRN: [id number] DOB: Jan 02, 1924 Today's Date: 03/15/2016    History of Present Illness Barry Dean is a 82 y.o. male with a Past Medical History of Parkinson's, HTN, SZR, deafness, reflux, A. fib, BPH, GERD, who presents with sepsis likely secondary to PNA (LLL HCAP vs aspiration). Patient presents from nursing home orders likely also picked up a viral gastroenteritis causing severe dehydration.     PT Comments    Patient remains confused but able to follow commands (mostly) for basic exercises and mobility.   Follow Up Recommendations  SNF;Supervision/Assistance - 24 hour     Equipment Recommendations  None recommended by PT    Recommendations for Other Services       Precautions / Restrictions Precautions Precautions: Fall;Other (comment) Precaution Comments: h/o seizures Restrictions Weight Bearing Restrictions: No    Mobility  Bed Mobility Overal bed mobility: Needs Assistance Bed Mobility: Supine to Sit     Supine to sit: Mod assist     General bed mobility comments: up in chair on arrival  Transfers Overall transfer level: Needs assistance   Transfers: Sit to/from Stand Sit to Stand: Mod assist;+2 safety/equipment Stand pivot transfers: Mod assist       General transfer comment: Requires 1 person x 1 attempt; second person for safety when standing from chair as pt with greater difficulty rising from low surface.  He requires facilitation for anterior translation of trunk when transitioning sit to stand   Ambulation/Gait             General Gait Details: non-ambulatory PTA (uses w/c and propels with his feet)   Stairs            Wheelchair Mobility    Modified Rankin (Stroke Patients Only)       Balance Overall balance assessment: Needs assistance Sitting-balance support: Feet supported Sitting balance-Leahy Scale: Poor Sitting balance - Comments: Pt initially  requires min guard assist, but progressed to mod A as he fatigued with EOB sitting.  He demonstrate posterior and Rt bias  Postural control: Posterior lean;Right lateral lean Standing balance support: Bilateral upper extremity supported Standing balance-Leahy Scale: Poor Standing balance comment: slight posterior bias; able to maintain midline once assisted to position                    Cognition Arousal/Alertness: Awake/alert Behavior During Therapy: Flat affect Overall Cognitive Status: No family/caregiver present to determine baseline cognitive functioning                      Exercises General Exercises - Lower Extremity Ankle Circles/Pumps: AROM;Both;10 reps Quad Sets: AROM;Both (3 reps and then unable to understand/continue) Long Arc Quad: AROM;Both;10 reps Toe Raises: AROM;Both;10 reps;Seated    General Comments General comments (skin integrity, edema, etc.): Pt with stool under fingernails and in watch and links of watch band.  Assisted pt with clean up, and watch left in plastic baggie with his belongings - RN notified       Pertinent Vitals/Pain Pain Assessment: No/denies pain    Home Living Family/patient expects to be discharged to:: Assisted living               Additional Comments: ALF vs SNF - Pt resided at [facility]     Prior Function Level of Independence: Needs assistance  Gait / Transfers Assistance Needed: Pt has not been ambulated for the past few months.  Per daughter the  MD told him it was more safe for him not to ambulate due to his frequent falls.  He uses his LEs to propel his WC for short distances, otherwise needs assist.  Pt pivots to [place] with RW and assist. ADL's / Homemaking Assistance Needed: Needs assist with bathing, dressing.  Ind with feeding per daughter.     PT Goals (current goals can now be found in the care plan section) Acute Rehab PT Goals Patient Stated Goal: none stated Time For Goal Achievement:  03/27/16 Progress towards PT goals: Progressing toward goals    Frequency  Min 2X/week    PT Plan Current plan remains appropriate    Co-evaluation             End of Session Equipment Utilized During Treatment: Gait belt;Oxygen Activity Tolerance: Patient limited by fatigue Patient left: with call bell/phone within reach;in chair;with chair alarm set     Time: [id number] PT Time Calculation (min) (ACUTE ONLY): 39 min  Charges:  $Therapeutic Exercise: 23-37 mins $Therapeutic Activity: 8-22 mins                    G Codes:      Barry Dean 2016-03-21, 12:48 PM Pager [phone]

## 2016-03-16 MED ORDER — AMLODIPINE BESYLATE 10 MG PO TABS
10.0000 mg | ORAL_TABLET | Freq: Every day | ORAL | Status: DC
  Administered 2016-03-16 – 2016-03-21 (×6): 10 mg via ORAL
  Filled 2016-03-16 (×6): qty 1

## 2016-03-16 MED ORDER — FUROSEMIDE 10 MG/ML IJ SOLN
60.0000 mg | Freq: Once | INTRAMUSCULAR | Status: AC
  Administered 2016-03-16: 60 mg via INTRAVENOUS
  Filled 2016-03-16: qty 6

## 2016-03-16 MED ORDER — POTASSIUM CHLORIDE CRYS ER 20 MEQ PO TBCR
40.0000 meq | EXTENDED_RELEASE_TABLET | Freq: Two times a day (BID) | ORAL | Status: DC
  Administered 2016-03-16 – 2016-03-17 (×2): 40 meq via ORAL
  Filled 2016-03-16 (×3): qty 2

## 2016-03-16 NOTE — Progress Notes (Signed)
[facility] TEAM 1 - Stepdown/ICU TEAM  Barry Dean  [id number] DOB: [birth year] DOA: 03/12/2016 PCP: [name], MD   Brief Narrative:  82 y.o. M Hx Anxiety, Depression, Parkinson's, BPH, MV Regurg, HTN, A. Fib, and GERD who presented from his assisted living facility due to acute encephalopathy. Initial evaluation revealed acute respiratory failure with hypoxia, hypotension, acute kidney injury, elevated lactic acid, and chest x-ray was concerning for pneumonia.  Assessment & Plan:   SIRS v/s Sepsis of unknown etiology w/ lactic acidosis  -etiology remains unclear - w/u ongoing - perhaps this was SIRS related to severe dehydration in the setting of ongoing watery diarrhea   Acute hypoxic resp failure  -I do suspect the patient likely had an episode of aspiration in the setting of recurrent vomiting - f/u CXR yesterday was profoundly worse but in the setting of aggressive volume resuscitation a suspect this may be pulmonary edema - continue attempts to diurese - antibiotics were discontinued after 3 days of treatment and despite worsening chest x-ray his fever curve has been downward and his white count has normalized - continue to monitor without resuming antibiotics for now   Acute kidney injury -Cr 1.54 on admission - related to extreme dehydration in setting of nausea vomiting diarrhea - resolved w/ volume expansion  Recent Labs Lab 03/12/16 1154 03/13/16 0319 03/14/16 0030 03/15/16 0331  CREATININE 1.54* 1.41* 0.91 0.75    Acute encephalopathy -CT head without acute abnormalities - [id number] and folic acid normal - the patient is much improved today and able to provide a full history   Chronic Grade 2 Diastolic CHF Noted on TTE July 2016, as well as this admit - EF preserved - net + ~10L since admit - suspect patient over hydrated at present  - attempt to diurese and follow   Filed Weights   03/14/16 0500 03/15/16 0500 03/16/16 0316  Weight: 95.1 kg (209 lb 10.5  oz) 98.1 kg (216 lb 4.3 oz) 97.1 kg (214 lb 1.1 oz)     Watery Diarrhea -Rectal tube in place - C. difficile negative - GI pathogen panel negative - continue to follow GI output   Nausea and vomiting -Patient reports continued nausea but no vomiting - follow   Hypokalemia  -Due to GI losses -  not yet corrected - continue to supplement and follow - magnesium is okay  Hypertension -Blood pressure acceptable in this 82 year old patient  MRSA Screen +  DVT prophylaxis: Subcutaneous heparin Code Status: DO NOT RESUSCITATE Family Communication: no family present at time of exam today  Disposition Plan: SDU  Consultants:  none  Procedures:  TTE - 7/19 - EF 60-65% - no WMA - grade 2 DD  Antimicrobials: Cefepime7/17 > 7/19 Vancomycin 7/17 > 7/19  Subjective: The patient is much more alert today.  He is able to carry on a conversation using his hearing aids.  He reports persistent nausea but no more vomiting.  He denies chest pain.  He admits to dyspnea even at rest which is not his baseline.    Objective: Filed Vitals:   03/16/16 0900 03/16/16 1000 03/16/16 1100 03/16/16 1114  BP:    135/67  Pulse: 84 91 88 90  Temp:    97.6 F (36.4 C)  TempSrc:    Oral  Resp: 23 22 21 20   Height:      Weight:      SpO2: 88% 94% 95% 95%    Intake/Output Summary (Last 24 hours) at 03/16/16 1451 Last  data filed at 03/16/16 1410  Gross per 24 hour  Intake   1370 ml  Output    950 ml  Net    420 ml   Filed Weights   03/14/16 0500 03/15/16 0500 03/16/16 0316  Weight: 95.1 kg (209 lb 10.5 oz) 98.1 kg (216 lb 4.3 oz) 97.1 kg (214 lb 1.1 oz)    Examination: General: Respirations comfortable at rest but requiring supplemental oxygen  Lungs: diffuse crackles throughout right fields with poor air movement in left base but no wheezing  Cardiovascular: Regular rate and rhythm without murmur gallop or rub normal S1 and S2 Abdomen: Nontender, nondistended, soft, bowel sounds positive, no  rebound Extremities: No significant cyanosis, or clubbing - trace edema bilateral lower extremities   CBC:  Recent Labs Lab 03/12/16 1154 03/12/16 1950 03/13/16 0319 03/14/16 0030 03/15/16 0331  WBC 8.6  --  12.4* 7.7 8.2  NEUTROABS 4.2  --   --  5.7  --   HGB 13.9  --  11.8* 9.7* 10.1*  HCT 41.7 37.2* 35.3* 29.9* 29.9*  MCV 89.3  --  89.1 89.3 87.4  PLT 335  --  262 185 [id number]   Basic Metabolic Panel:  Recent Labs Lab 03/12/16 1154 03/13/16 0319 03/14/16 0030 03/15/16 0331 03/15/16 1505 03/15/16 2229  NA 132* 136 132* 131*  --   --   K 3.5 4.3 3.4* 3.1* 3.3* 3.1*  CL 100* 109 107 103  --   --   CO2 22 20* 21* 23  --   --   GLUCOSE 154* 129* 129* 85  --   --   BUN 14 21* 17 8  --   --   CREATININE 1.54* 1.41* 0.91 0.75  --   --   CALCIUM 9.7 8.1* 7.9* 7.8*  --   --   MG  --   --  1.8  --  1.6* 2.0   GFR: Estimated Creatinine Clearance: 75.3 mL/min (by C-G formula based on Cr of 0.75).   Liver Function Tests:  Recent Labs Lab 03/12/16 1154 03/13/16 0319 03/14/16 0030 03/15/16 0331  AST 36 57* 83* 87*  ALT 23 32 58 84*  ALKPHOS 98 60 47 58  BILITOT 0.7 0.6 0.7 0.8  PROT 7.6 5.2* 4.8* 4.9*  ALBUMIN 4.4 3.0* 2.5* 2.7*    Recent Results (from the past 240 hour(s))  Blood culture (routine x 2)     Status: None (Preliminary result)   Collection Time: 03/12/16 12:17 PM  Result Value Ref Range Status   Specimen Description BLOOD RIGHT ANTECUBITAL  Final   Special Requests BOTTLES DRAWN AEROBIC AND ANAEROBIC  5CC  Final   Culture NO GROWTH 4 DAYS  Final   Report Status PENDING  Incomplete  Blood culture (routine x 2)     Status: None (Preliminary result)   Collection Time: 03/12/16 12:31 PM  Result Value Ref Range Status   Specimen Description BLOOD LEFT HAND  Final   Special Requests BOTTLES DRAWN AEROBIC AND ANAEROBIC  5CC  Final   Culture NO GROWTH 4 DAYS  Final   Report Status PENDING  Incomplete  Urine culture     Status: None   Collection Time:  03/12/16  2:14 PM  Result Value Ref Range Status   Specimen Description URINE, RANDOM  Final   Special Requests NONE  Final   Culture NO GROWTH  Final   Report Status 03/13/2016 FINAL  Final  Gastrointestinal Panel by PCR , Stool  Status: None   Collection Time: 03/12/16  6:34 PM  Result Value Ref Range Status   Campylobacter species NOT DETECTED NOT DETECTED Final   Plesimonas shigelloides NOT DETECTED NOT DETECTED Final   Salmonella species NOT DETECTED NOT DETECTED Final   Yersinia enterocolitica NOT DETECTED NOT DETECTED Final   Vibrio species NOT DETECTED NOT DETECTED Final   Vibrio cholerae NOT DETECTED NOT DETECTED Final   Enteroaggregative E coli (EAEC) NOT DETECTED NOT DETECTED Final   Enteropathogenic E coli (EPEC) NOT DETECTED NOT DETECTED Final   Enterotoxigenic E coli (ETEC) NOT DETECTED NOT DETECTED Final   Shiga like toxin producing E coli (STEC) NOT DETECTED NOT DETECTED Final   E. coli O157 NOT DETECTED NOT DETECTED Final   Shigella/Enteroinvasive E coli (EIEC) NOT DETECTED NOT DETECTED Final   Cryptosporidium NOT DETECTED NOT DETECTED Final   Cyclospora cayetanensis NOT DETECTED NOT DETECTED Final   Entamoeba histolytica NOT DETECTED NOT DETECTED Final   Giardia lamblia NOT DETECTED NOT DETECTED Final   Adenovirus F40/41 NOT DETECTED NOT DETECTED Final   Astrovirus NOT DETECTED NOT DETECTED Final   Norovirus GI/GII NOT DETECTED NOT DETECTED Final   Rotavirus A NOT DETECTED NOT DETECTED Final   Sapovirus (I, II, IV, and V) NOT DETECTED NOT DETECTED Final  MRSA PCR Screening     Status: Abnormal   Collection Time: 03/12/16  7:16 PM  Result Value Ref Range Status   MRSA by PCR POSITIVE (A) NEGATIVE Final    Comment:        The GeneXpert MRSA Assay (FDA approved for NASAL specimens only), is one component of a comprehensive MRSA colonization surveillance program. It is not intended to diagnose MRSA infection nor to guide or monitor treatment for MRSA  infections. RESULT CALLED TO, READ BACK BY AND VERIFIED WITH: [facility] RN [id number] 03/12/16 A BROWNING   C difficile quick scan w PCR reflex     Status: None   Collection Time: 03/13/16 11:14 AM  Result Value Ref Range Status   C Diff antigen NEGATIVE NEGATIVE Final   C Diff toxin NEGATIVE NEGATIVE Final   C Diff interpretation No C. difficile detected.  Final     Scheduled Meds: . aspirin  81 mg Oral QPM  . Chlorhexidine Gluconate Cloth  6 each Topical Q0600  . enoxaparin (LOVENOX) injection  40 mg Subcutaneous Q24H  . feeding supplement  1 Container Oral TID BM  . lacosamide  150 mg Oral QHS  . lacosamide  50 mg Oral Daily  . lamoTRIgine  75 mg Oral BID  . loperamide  2 mg Oral Q8H  . mupirocin ointment  1 application Nasal BID  . pantoprazole  40 mg Oral Daily  . sodium chloride flush  3 mL Intravenous Q12H     LOS: 4 days   Cherene Altes, MD [facility] Office  [phone] Pager - Text Page per Amion as per below:  On-Call/Text Page:      [email or web address]      password [email or web address]  If 7PM-7AM, please contact night-coverage [email or web address] Password [email or web address] 03/16/2016, 2:51 PM

## 2016-03-17 ENCOUNTER — Inpatient Hospital Stay (HOSPITAL_COMMUNITY): Payer: Medicare Other

## 2016-03-17 LAB — CULTURE, BLOOD (ROUTINE X 2)
Culture: NO GROWTH
Culture: NO GROWTH

## 2016-03-17 LAB — COMPREHENSIVE METABOLIC PANEL
ALT: 59 U/L (ref 17–63)
ANION GAP: 8 (ref 5–15)
AST: 51 U/L — ABNORMAL HIGH (ref 15–41)
Albumin: 2.5 g/dL — ABNORMAL LOW (ref 3.5–5.0)
Alkaline Phosphatase: 53 U/L (ref 38–126)
BUN: 9 mg/dL (ref 6–20)
CHLORIDE: 93 mmol/L — AB (ref 101–111)
CO2: 28 mmol/L (ref 22–32)
Calcium: 7.9 mg/dL — ABNORMAL LOW (ref 8.9–10.3)
Creatinine, Ser: 0.86 mg/dL (ref 0.61–1.24)
GFR calc non Af Amer: >60 mL/min (ref >60)
Glucose, Bld: 105 mg/dL — ABNORMAL HIGH (ref 65–99)
POTASSIUM: 3.3 mmol/L — AB (ref 3.5–5.1)
SODIUM: 129 mmol/L — AB (ref 135–145)
Total Bilirubin: 0.7 mg/dL (ref 0.3–1.2)
Total Protein: 5.2 g/dL — ABNORMAL LOW (ref 6.5–8.1)

## 2016-03-17 LAB — CBC
HCT: 30.5 % — ABNORMAL LOW (ref 39.0–52.0)
HEMOGLOBIN: 10.3 g/dL — AB (ref 13.0–17.0)
MCH: 29.3 pg (ref 26.0–34.0)
MCHC: 33.8 g/dL (ref 30.0–36.0)
MCV: 86.9 fL (ref 78.0–100.0)
Platelets: 231 10*3/uL (ref 150–400)
RBC: 3.51 MIL/uL — AB (ref 4.22–5.81)
RDW: 13.2 % (ref 11.5–15.5)
WBC: 7.3 10*3/uL (ref 4.0–10.5)

## 2016-03-17 LAB — GLUCOSE, CAPILLARY: Glucose-Capillary: 115 mg/dL — ABNORMAL HIGH (ref 65–99)

## 2016-03-17 MED ORDER — POTASSIUM CHLORIDE CRYS ER 20 MEQ PO TBCR
40.0000 meq | EXTENDED_RELEASE_TABLET | Freq: Two times a day (BID) | ORAL | Status: AC
  Administered 2016-03-17 – 2016-03-19 (×4): 40 meq via ORAL
  Filled 2016-03-17 (×4): qty 2

## 2016-03-17 MED ORDER — FUROSEMIDE 10 MG/ML IJ SOLN
40.0000 mg | Freq: Two times a day (BID) | INTRAMUSCULAR | Status: DC
  Administered 2016-03-17: 40 mg via INTRAVENOUS
  Filled 2016-03-17: qty 4

## 2016-03-17 NOTE — Progress Notes (Signed)
Patient O2 sats sustaining 80 to 81% on 6L/[place]. NRB mask applied and O2 sats sustaining 88%. O2 probe changed and good wave form noted.  Will continue to monitor closely.

## 2016-03-17 NOTE — Progress Notes (Signed)
[place] TEAM 1 - Stepdown/ICU TEAM  JEEVAN SCHUTH  [id number] DOB: [birth year] DOA: 03/12/2016 PCP: [name], MD   Brief Narrative:  82 y.o. M Hx Anxiety, Depression, Parkinson's, BPH, MV Regurg, HTN, A. Fib, and GERD who presented from his assisted living facility due to acute encephalopathy. Initial evaluation revealed acute respiratory failure with hypoxia, hypotension, acute kidney injury, elevated lactic acid, and chest x-ray was concerning for pneumonia.  Subjective:  Assessment & Plan:   SIRS v/s Sepsis of unknown etiology w/ lactic acidosis  -etiology remains unclear - w/u ongoing - perhaps this was SIRS related to severe dehydration in the setting of ongoing watery diarrhea   Acute hypoxic resp failure  -I suspect the patient likely had an episode of aspiration in the setting of recurrent vomiting prior to his admit - f/u CXR 7/20 was profoundly worse but in the setting of aggressive volume resuscitation I suspect this represents asymetric pulmonary edema (the pt favors leaning to his R side in bed) - continue to diurese - antibiotics were discontinued after 3 days of treatment and despite worsening chest x-ray his fever curve remains downward and his white count has normalized - continue to monitor without resuming antibiotics for now - wean O2 as able w/ ongoing diuresis    Acute kidney injury -Cr 1.54 on admission - related to extreme dehydration in setting of nausea vomiting diarrhea - resolved w/ volume expansion - now requiring diuresis so will have to cont to monitor renal fxn closely   Recent Labs Lab 03/12/16 1154 03/13/16 0319 03/14/16 0030 03/15/16 0331 03/17/16 0210  CREATININE 1.54* 1.41* 0.91 0.75 0.86    Acute encephalopathy -CT head without acute abnormalities - 123456 and folic acid normal - the patient has now returned to his baseline mental status  Chronic Grade 2 Diastolic CHF Noted on TTE July 2016, as well as this admit - EF preserved  - net + ~11L since admit - suspect patient still fluid overloaded - continue to diurese and follow  Filed Weights   03/15/16 0500 03/16/16 0316 03/17/16 0348  Weight: 98.1 kg (216 lb 4.3 oz) 97.1 kg (214 lb 1.1 oz) 94.7 kg (208 lb 12.4 oz)     Watery Diarrhea -Rectal tube in place - C. difficile negative - GI pathogen panel negative - volume of output appears to be on a decline - follow   Nausea and vomiting -appears to have resolved for now - follow   Hypokalemia  -Due to GI losses -  not yet corrected to goal - continue to supplement and follow - magnesium is okay  Recent Labs Lab 03/14/16 0030 03/15/16 0331 03/15/16 1505 03/15/16 2229 03/17/16 0210  K 3.4* 3.1* 3.3* 3.1* 3.3*    Hypertension -Blood pressure acceptable in this 82 year old patient  MRSA Screen +  DVT prophylaxis: Subcutaneous heparin Code Status: DO NOT RESUSCITATE Family Communication: spoke w/ daughter via phone Disposition Plan: SDU  Consultants:  none  Procedures:  TTE - 7/19 - EF 60-65% - no WMA - grade 2 DD  Antimicrobials: Cefepime7/17 > 7/19 Vancomycin 7/17 > 7/19  Objective: Filed Vitals:   03/17/16 0920 03/17/16 1100 03/17/16 1116 03/17/16 1120  BP: 159/82  127/76 127/76  Pulse: 83 101 100 98  Temp:   98.4 F (36.9 C)   TempSrc:   Oral   Resp: 28 24 24 22   Height:      Weight:      SpO2: 96% 94% 91% 90%    Intake/Output  Summary (Last 24 hours) at 03/17/16 1504 Last data filed at 03/17/16 1118  Gross per 24 hour  Intake   1080 ml  Output      0 ml  Net   1080 ml   Filed Weights   03/15/16 0500 03/16/16 0316 03/17/16 0348  Weight: 98.1 kg (216 lb 4.3 oz) 97.1 kg (214 lb 1.1 oz) 94.7 kg (208 lb 12.4 oz)    Examination: General: No evidence of acute respiratory distress Lungs: diffuse crackles throughout right fields with poor air movement in left base but no wheezing - no significant change since exam yesterday Cardiovascular: Regular rate and rhythm without murmur  gallop or rub  Abdomen: Nontender, nondistended, soft, bowel sounds positive, no rebound Extremities: No significant cyanosis, or clubbing - trace edema bilateral lower extremities persists   CBC:  Recent Labs Lab 03/12/16 1154 03/12/16 1950 03/13/16 0319 03/14/16 0030 03/15/16 0331 03/17/16 0210  WBC 8.6  --  12.4* 7.7 8.2 7.3  NEUTROABS 4.2  --   --  5.7  --   --   HGB 13.9  --  11.8* 9.7* 10.1* 10.3*  HCT 41.7 37.2* 35.3* 29.9* 29.9* 30.5*  MCV 89.3  --  89.1 89.3 87.4 86.9  PLT 335  --  262 185 208 [id number]   Basic Metabolic Panel:  Recent Labs Lab 03/12/16 1154 03/13/16 0319 03/14/16 0030 03/15/16 0331 03/15/16 1505 03/15/16 2229 03/17/16 0210  NA 132* 136 132* 131*  --   --  129*  K 3.5 4.3 3.4* 3.1* 3.3* 3.1* 3.3*  CL 100* 109 107 103  --   --  93*  CO2 22 20* 21* 23  --   --  28  GLUCOSE 154* 129* 129* 85  --   --  105*  BUN 14 21* 17 8  --   --  9  CREATININE 1.54* 1.41* 0.91 0.75  --   --  0.86  CALCIUM 9.7 8.1* 7.9* 7.8*  --   --  7.9*  MG  --   --  1.8  --  1.6* 2.0  --    GFR: Estimated Creatinine Clearance: 64.5 mL/min (by C-G formula based on Cr of 0.86).   Liver Function Tests:  Recent Labs Lab 03/12/16 1154 03/13/16 0319 03/14/16 0030 03/15/16 0331 03/17/16 0210  AST 36 57* 83* 87* 51*  ALT 23 32 58 84* 59  ALKPHOS 98 60 47 58 53  BILITOT 0.7 0.6 0.7 0.8 0.7  PROT 7.6 5.2* 4.8* 4.9* 5.2*  ALBUMIN 4.4 3.0* 2.5* 2.7* 2.5*    Recent Results (from the past 240 hour(s))  Blood culture (routine x 2)     Status: None (Preliminary result)   Collection Time: 03/12/16 12:17 PM  Result Value Ref Range Status   Specimen Description BLOOD RIGHT ANTECUBITAL  Final   Special Requests BOTTLES DRAWN AEROBIC AND ANAEROBIC  5CC  Final   Culture NO GROWTH 4 DAYS  Final   Report Status PENDING  Incomplete  Blood culture (routine x 2)     Status: None (Preliminary result)   Collection Time: 03/12/16 12:31 PM  Result Value Ref Range Status   Specimen  Description BLOOD LEFT HAND  Final   Special Requests BOTTLES DRAWN AEROBIC AND ANAEROBIC  5CC  Final   Culture NO GROWTH 4 DAYS  Final   Report Status PENDING  Incomplete  Urine culture     Status: None   Collection Time: 03/12/16  2:14 PM  Result Value  Ref Range Status   Specimen Description URINE, RANDOM  Final   Special Requests NONE  Final   Culture NO GROWTH  Final   Report Status 03/13/2016 FINAL  Final  Gastrointestinal Panel by PCR , Stool     Status: None   Collection Time: 03/12/16  6:34 PM  Result Value Ref Range Status   Campylobacter species NOT DETECTED NOT DETECTED Final   Plesimonas shigelloides NOT DETECTED NOT DETECTED Final   Salmonella species NOT DETECTED NOT DETECTED Final   Yersinia enterocolitica NOT DETECTED NOT DETECTED Final   Vibrio species NOT DETECTED NOT DETECTED Final   Vibrio cholerae NOT DETECTED NOT DETECTED Final   Enteroaggregative E coli (EAEC) NOT DETECTED NOT DETECTED Final   Enteropathogenic E coli (EPEC) NOT DETECTED NOT DETECTED Final   Enterotoxigenic E coli (ETEC) NOT DETECTED NOT DETECTED Final   Shiga like toxin producing E coli (STEC) NOT DETECTED NOT DETECTED Final   E. coli O157 NOT DETECTED NOT DETECTED Final   Shigella/Enteroinvasive E coli (EIEC) NOT DETECTED NOT DETECTED Final   Cryptosporidium NOT DETECTED NOT DETECTED Final   Cyclospora cayetanensis NOT DETECTED NOT DETECTED Final   Entamoeba histolytica NOT DETECTED NOT DETECTED Final   Giardia lamblia NOT DETECTED NOT DETECTED Final   Adenovirus F40/41 NOT DETECTED NOT DETECTED Final   Astrovirus NOT DETECTED NOT DETECTED Final   Norovirus GI/GII NOT DETECTED NOT DETECTED Final   Rotavirus A NOT DETECTED NOT DETECTED Final   Sapovirus (I, II, IV, and V) NOT DETECTED NOT DETECTED Final  MRSA PCR Screening     Status: Abnormal   Collection Time: 03/12/16  7:16 PM  Result Value Ref Range Status   MRSA by PCR POSITIVE (A) NEGATIVE Final    Comment:        The GeneXpert  MRSA Assay (FDA approved for NASAL specimens only), is one component of a comprehensive MRSA colonization surveillance program. It is not intended to diagnose MRSA infection nor to guide or monitor treatment for MRSA infections. RESULT CALLED TO, READ BACK BY AND VERIFIED WITH: M [facility] RN [id number] 03/12/16 A BROWNING   C difficile quick scan w PCR reflex     Status: None   Collection Time: 03/13/16 11:14 AM  Result Value Ref Range Status   C Diff antigen NEGATIVE NEGATIVE Final   C Diff toxin NEGATIVE NEGATIVE Final   C Diff interpretation No C. difficile detected.  Final     Scheduled Meds: . amLODipine  10 mg Oral Daily  . aspirin  81 mg Oral QPM  . Chlorhexidine Gluconate Cloth  6 each Topical Q0600  . enoxaparin (LOVENOX) injection  40 mg Subcutaneous Q24H  . feeding supplement  1 Container Oral TID BM  . lacosamide  150 mg Oral QHS  . lacosamide  50 mg Oral Daily  . lamoTRIgine  75 mg Oral BID  . loperamide  2 mg Oral Q8H  . mupirocin ointment  1 application Nasal BID  . pantoprazole  40 mg Oral Daily  . potassium chloride  40 mEq Oral BID  . sodium chloride flush  3 mL Intravenous Q12H     LOS: 5 days   Cherene Altes, MD [facility] Office  [phone] Pager - Text Page per Shea Evans as per below:  On-Call/Text Page:      [email or web address]      password [email or web address]  If 7PM-7AM, please contact night-coverage [email or web address] Password [facility] 03/17/2016, 3:04 PM

## 2016-03-18 LAB — BASIC METABOLIC PANEL
Anion gap: 7 (ref 5–15)
BUN: 14 mg/dL (ref 6–20)
CALCIUM: 8.1 mg/dL — AB (ref 8.9–10.3)
CHLORIDE: 93 mmol/L — AB (ref 101–111)
CO2: 30 mmol/L (ref 22–32)
CREATININE: 0.88 mg/dL (ref 0.61–1.24)
Glucose, Bld: 119 mg/dL — ABNORMAL HIGH (ref 65–99)
Potassium: 3.5 mmol/L (ref 3.5–5.1)
SODIUM: 130 mmol/L — AB (ref 135–145)

## 2016-03-18 LAB — CBC
HEMATOCRIT: 29.7 % — AB (ref 39.0–52.0)
Hemoglobin: 9.9 g/dL — ABNORMAL LOW (ref 13.0–17.0)
MCH: 29.3 pg (ref 26.0–34.0)
MCHC: 33.3 g/dL (ref 30.0–36.0)
MCV: 87.9 fL (ref 78.0–100.0)
PLATELETS: 226 10*3/uL (ref 150–400)
RBC: 3.38 MIL/uL — AB (ref 4.22–5.81)
RDW: 13.5 % (ref 11.5–15.5)
WBC: 6.5 10*3/uL (ref 4.0–10.5)

## 2016-03-18 MED ORDER — FUROSEMIDE 10 MG/ML IJ SOLN
40.0000 mg | Freq: Two times a day (BID) | INTRAMUSCULAR | Status: AC
  Administered 2016-03-18 – 2016-03-19 (×2): 40 mg via INTRAVENOUS
  Filled 2016-03-18 (×2): qty 4

## 2016-03-18 MED ORDER — LOPERAMIDE HCL 2 MG PO CAPS
4.0000 mg | ORAL_CAPSULE | Freq: Three times a day (TID) | ORAL | Status: DC
  Administered 2016-03-19 – 2016-03-21 (×8): 4 mg via ORAL
  Filled 2016-03-18 (×10): qty 2

## 2016-03-18 NOTE — Progress Notes (Addendum)
[place] TEAM 1 - Stepdown/ICU TEAM  MIKOL HADLOCK  [id number] DOB: 10/28/1923 DOA: 03/12/2016 PCP: [name], MD   Brief Narrative:  82 y.o. M Hx Anxiety, Depression, Parkinson's, BPH, MV Regurg, HTN, A. Fib, and GERD who presented from his assisted living facility due to acute encephalopathy. Initial evaluation revealed acute respiratory failure with hypoxia, hypotension, acute kidney injury, elevated lactic acid, and chest x-ray was concerning for pneumonia.  Subjective: The patient is alert and conversant.  He denies nausea or vomiting and is tolerating his diet well.  He continues to have very watery stools but they have significantly decreased in frequency and volume.  He denies chest pain nausea vomiting or abdominal pain.  His oxygen requirement has greatly decreased and he denies shortness of breath at rest.  Assessment & Plan:   SIRS v/s Sepsis of unknown etiology w/ lactic acidosis - resolved  -clinically appears to have been SIRS related to severe dehydration in the setting of ongoing watery diarrhea rather than an acute infection/sepsis   Acute hypoxic resp failure  -I suspect the patient likely had an initial episode of aspiration in the setting of recurrent vomiting prior to his admit - f/u CXR 7/20 was profoundly worse but in the setting of aggressive volume resuscitation I suspect this represented asymetric pulmonary edema (the pt favors leaning to his R side in bed) - antibiotics were discontinued after 3 days of treatment, and the pt was initially improving - now his temp curve has deflected upward again, though his WBC are still normal and clinically he continues to look much better - will not resume abx now, but will have a low threshold to do so should his fever return or WBC cont to climb - wean O2 as able - f/u CXR in AM   Acute kidney injury - resolved -Cr 1.54 on admission - related to extreme dehydration in setting of nausea vomiting diarrhea -  resolved w/ volume expansion - follow as GI output persists   Recent Labs Lab 03/13/16 0319 03/14/16 0030 03/15/16 0331 03/17/16 0210 03/18/16 0225  CREATININE 1.41* 0.91 0.75 0.86 0.88    Acute encephalopathy - resolved  -CT head without acute abnormalities - 123456 and folic acid normal - the patient has now returned to his baseline mental status  Chronic Grade 2 Diastolic CHF Noted on TTE July 2016, as well as this admit - EF preserved - net + ~10L since admit if Is/Os are correct - clinically now appears euvolemic - stop diuresis and follow - f/u CXR in AM  [facility] Weights   03/16/16 0316 03/17/16 0348 03/18/16 0500  Weight: 97.1 kg (214 lb 1.1 oz) 94.7 kg (208 lb 12.4 oz) 95.4 kg (210 lb 5.1 oz)     Watery Diarrhea -Rectal tube in place - C. difficile negative - GI pathogen panel negative - volume of output appears to have signif declined, but remains very watery in character - increase immodium further and follow - ?due to vimpat  Nausea and vomiting - resolved  -appears to have resolved for now   Hypokalemia  -Due to GI losses -  K+ not yet at goal - continue to supplement and follow - magnesium is okay  Recent Labs Lab 03/15/16 0331 03/15/16 1505 03/15/16 2229 03/17/16 0210 03/18/16 0225  K 3.1* 3.3* 3.1* 3.3* 3.5    Hypertension -Blood pressure acceptable in this 82 year old patient  MRSA Screen +  DVT prophylaxis: Subcutaneous heparin Code Status: DO NOT RESUSCITATE Family Communication: No  family present at time of exam today Disposition Plan: Transfer to medical bed - PT evaluation (patient usually able to get up into a wheelchair and "ambulate" on his own feel wheelchair) - family strongly desires return to his familiar ALF, possibly w/ HHPT/OT/AID if able - will have to see how progresses w/ tx  Consultants:  none  Procedures:  TTE - 7/19 - EF 60-65% - no WMA - grade 2 DD  Antimicrobials: Cefepime7/17 > 7/19 Vancomycin 7/17 >  7/19  Objective: Vitals:   03/18/16 0800 03/18/16 0900 03/18/16 0905 03/18/16 1200  BP:   132/73 124/62  Pulse: 77 78 88 85  Resp: (!) 24 18 (!) 26 20  Temp:   97.9 F (36.6 C) 99.2 F (37.3 C)  TempSrc:   Oral Axillary  SpO2: 97% 98% 95% 96%  Weight:      Height:        Intake/Output Summary (Last 24 hours) at 03/18/16 1510 Last data filed at 03/18/16 1300  Gross per 24 hour  Intake              480 ml  Output             1125 ml  Net             -645 ml   Filed Weights   03/16/16 0316 03/17/16 0348 03/18/16 0500  Weight: 97.1 kg (214 lb 1.1 oz) 94.7 kg (208 lb 12.4 oz) 95.4 kg (210 lb 5.1 oz)    Examination: General: No evidence of acute respiratory distress - alert and pleasant - HOH Lungs: diminished crackles throughout right fields - improved air movement throughout - no wheezing Cardiovascular: Regular rate and rhythm without murmur  Abdomen: Nontender, nondistended, soft, bowel sounds positive, no rebound Extremities: No significant cyanosis, clubbing or edema bilateral lower extremities   CBC:  Recent Labs Lab 03/12/16 1154  03/13/16 0319 03/14/16 0030 03/15/16 0331 03/17/16 0210 03/18/16 0225  WBC 8.6  --  12.4* 7.7 8.2 7.3 6.5  NEUTROABS 4.2  --   --  5.7  --   --   --   HGB 13.9  --  11.8* 9.7* 10.1* 10.3* 9.9*  HCT 41.7  < > 35.3* 29.9* 29.9* 30.5* 29.7*  MCV 89.3  --  89.1 89.3 87.4 86.9 87.9  PLT 335  --  262 185 208 231 226  < > = values in this interval not displayed. Basic Metabolic Panel:  Recent Labs Lab 03/13/16 0319 03/14/16 0030 03/15/16 0331 03/15/16 1505 03/15/16 2229 03/17/16 0210 03/18/16 0225  NA 136 132* 131*  --   --  129* 130*  K 4.3 3.4* 3.1* 3.3* 3.1* 3.3* 3.5  CL 109 107 103  --   --  93* 93*  CO2 20* 21* 23  --   --  28 30  GLUCOSE 129* 129* 85  --   --  105* 119*  BUN 21* 17 8  --   --  9 14  CREATININE 1.41* 0.91 0.75  --   --  0.86 0.88  CALCIUM 8.1* 7.9* 7.8*  --   --  7.9* 8.1*  MG  --  1.8  --  1.6* 2.0   --   --    GFR: Estimated Creatinine Clearance: 63.1 mL/min (by C-G formula based on SCr of 0.88 mg/dL).   Liver Function Tests:  Recent Labs Lab 03/12/16 1154 03/13/16 0319 03/14/16 0030 03/15/16 0331 03/17/16 0210  AST 36 57* 83* 87*  51*  ALT 23 32 58 84* 59  ALKPHOS 98 60 47 58 53  BILITOT 0.7 0.6 0.7 0.8 0.7  PROT 7.6 5.2* 4.8* 4.9* 5.2*  ALBUMIN 4.4 3.0* 2.5* 2.7* 2.5*    Recent Results (from the past 240 hour(s))  Blood culture (routine x 2)     Status: None   Collection Time: 03/12/16 12:17 PM  Result Value Ref Range Status   Specimen Description BLOOD RIGHT ANTECUBITAL  Final   Special Requests BOTTLES DRAWN AEROBIC AND ANAEROBIC  5CC  Final   Culture NO GROWTH 5 DAYS  Final   Report Status 03/17/2016 FINAL  Final  Blood culture (routine x 2)     Status: None   Collection Time: 03/12/16 12:31 PM  Result Value Ref Range Status   Specimen Description BLOOD LEFT HAND  Final   Special Requests BOTTLES DRAWN AEROBIC AND ANAEROBIC  5CC  Final   Culture NO GROWTH 5 DAYS  Final   Report Status 03/17/2016 FINAL  Final  Urine culture     Status: None   Collection Time: 03/12/16  2:14 PM  Result Value Ref Range Status   Specimen Description URINE, RANDOM  Final   Special Requests NONE  Final   Culture NO GROWTH  Final   Report Status 03/13/2016 FINAL  Final  Gastrointestinal Panel by PCR , Stool     Status: None   Collection Time: 03/12/16  6:34 PM  Result Value Ref Range Status   Campylobacter species NOT DETECTED NOT DETECTED Final   Plesimonas shigelloides NOT DETECTED NOT DETECTED Final   Salmonella species NOT DETECTED NOT DETECTED Final   Yersinia enterocolitica NOT DETECTED NOT DETECTED Final   Vibrio species NOT DETECTED NOT DETECTED Final   Vibrio cholerae NOT DETECTED NOT DETECTED Final   Enteroaggregative E coli (EAEC) NOT DETECTED NOT DETECTED Final   Enteropathogenic E coli (EPEC) NOT DETECTED NOT DETECTED Final   Enterotoxigenic E coli (ETEC) NOT  DETECTED NOT DETECTED Final   Shiga like toxin producing E coli (STEC) NOT DETECTED NOT DETECTED Final   E. coli O157 NOT DETECTED NOT DETECTED Final   Shigella/Enteroinvasive E coli (EIEC) NOT DETECTED NOT DETECTED Final   Cryptosporidium NOT DETECTED NOT DETECTED Final   Cyclospora cayetanensis NOT DETECTED NOT DETECTED Final   Entamoeba histolytica NOT DETECTED NOT DETECTED Final   Giardia lamblia NOT DETECTED NOT DETECTED Final   Adenovirus F40/41 NOT DETECTED NOT DETECTED Final   Astrovirus NOT DETECTED NOT DETECTED Final   Norovirus GI/GII NOT DETECTED NOT DETECTED Final   Rotavirus A NOT DETECTED NOT DETECTED Final   Sapovirus (I, II, IV, and V) NOT DETECTED NOT DETECTED Final  MRSA PCR Screening     Status: Abnormal   Collection Time: 03/12/16  7:16 PM  Result Value Ref Range Status   MRSA by PCR POSITIVE (A) NEGATIVE Final    Comment:        The GeneXpert MRSA Assay (FDA approved for NASAL specimens only), is one component of a comprehensive MRSA colonization surveillance program. It is not intended to diagnose MRSA infection nor to guide or monitor treatment for MRSA infections. RESULT CALLED TO, READ BACK BY AND VERIFIED WITH: M [facility] RN [id number] 03/12/16 A BROWNING   C difficile quick scan w PCR reflex     Status: None   Collection Time: 03/13/16 11:14 AM  Result Value Ref Range Status   C Diff antigen NEGATIVE NEGATIVE Final   C Diff toxin NEGATIVE NEGATIVE  Final   C Diff interpretation No C. difficile detected.  Final     Scheduled Meds: . amLODipine  10 mg Oral Daily  . aspirin  81 mg Oral QPM  . enoxaparin (LOVENOX) injection  40 mg Subcutaneous Q24H  . feeding supplement  1 Container Oral TID BM  . furosemide  40 mg Intravenous Q12H  . lacosamide  150 mg Oral QHS  . lacosamide  50 mg Oral Daily  . lamoTRIgine  75 mg Oral BID  . loperamide  2 mg Oral Q8H  . mupirocin ointment  1 application Nasal BID  . pantoprazole  40 mg Oral Daily  . potassium chloride   40 mEq Oral BID  . sodium chloride flush  3 mL Intravenous Q12H     LOS: 6 days   Cherene Altes, MD [facility] Office  [phone] Pager - Text Page per Shea Evans as per below:  On-Call/Text Page:      [email or web address]      password [email or web address]  If 7PM-7AM, please contact night-coverage [email or web address] Password [email or web address] 03/18/2016, 3:10 PM

## 2016-03-18 NOTE — Progress Notes (Signed)
Pt arrived in bed from [place] with nurse and NT. Checked in and explained location and nurses call light along with TV control and telephone. Placed hearing aids back in so that patient could hear instructions. Will continue to follow as needed.

## 2016-03-19 ENCOUNTER — Inpatient Hospital Stay (HOSPITAL_COMMUNITY): Payer: Medicare Other

## 2016-03-19 LAB — BASIC METABOLIC PANEL
Anion gap: 5 (ref 5–15)
BUN: 18 mg/dL (ref 6–20)
CALCIUM: 8.3 mg/dL — AB (ref 8.9–10.3)
CO2: 31 mmol/L (ref 22–32)
CREATININE: 0.88 mg/dL (ref 0.61–1.24)
Chloride: 95 mmol/L — ABNORMAL LOW (ref 101–111)
GFR calc Af Amer: >60 mL/min (ref >60)
Glucose, Bld: 98 mg/dL (ref 65–99)
Potassium: 3.8 mmol/L (ref 3.5–5.1)
SODIUM: 131 mmol/L — AB (ref 135–145)

## 2016-03-19 LAB — CBC
HCT: 29.3 % — ABNORMAL LOW (ref 39.0–52.0)
Hemoglobin: 9.5 g/dL — ABNORMAL LOW (ref 13.0–17.0)
MCH: 28.7 pg (ref 26.0–34.0)
MCHC: 32.4 g/dL (ref 30.0–36.0)
MCV: 88.5 fL (ref 78.0–100.0)
PLATELETS: 272 10*3/uL (ref 150–400)
RBC: 3.31 MIL/uL — ABNORMAL LOW (ref 4.22–5.81)
RDW: 13.7 % (ref 11.5–15.5)
WBC: 5.5 10*3/uL (ref 4.0–10.5)

## 2016-03-19 MED ORDER — ENSURE ENLIVE PO LIQD
237.0000 mL | ORAL | Status: DC
  Administered 2016-03-19 – 2016-03-22 (×4): 237 mL via ORAL

## 2016-03-19 MED ORDER — FUROSEMIDE 10 MG/ML IJ SOLN
40.0000 mg | Freq: Once | INTRAMUSCULAR | Status: AC
  Administered 2016-03-19: 40 mg via INTRAVENOUS
  Filled 2016-03-19: qty 4

## 2016-03-19 MED ORDER — CHOLESTYRAMINE LIGHT 4 G PO PACK
4.0000 g | PACK | Freq: Two times a day (BID) | ORAL | Status: AC
  Administered 2016-03-19 – 2016-03-20 (×3): 4 g via ORAL
  Filled 2016-03-19 (×4): qty 1

## 2016-03-19 NOTE — Consult Note (Signed)
   [facility] CM Inpatient Consult   03/19/2016  Barry Dean 16-Apr-1924 [id number]  Patient screened for potential [facility] Management services. Patient is eligible for [facility]. Electronic medical record reveals patient's discharge plan is for skilled nursing facility.  Patient is from an assisted living facility per notes and there were no identifiable [facility] care management needs at this time. [facility] Care Management services not appropriate at this time. If patient's post hospital needs change please place a [facility] Care Management consult. For questions please contact:   Natividad Brood, RN BSN [facility] Liaison  [phone] business mobile phone Toll free office [phone]

## 2016-03-19 NOTE — Progress Notes (Signed)
CSW spoke with pt dtr concerning SNF options- pt and pt dtr want pt to return to [name] and receive rehab there  Kimberly spoke with RN at [facility]- they are agreeable to taking patient back when stable for DC- per MD hopeful for DC tomorrow- updates faxed to [phone]  CSW will continue to follow  Domenica Reamer, [place] Worker [phone]

## 2016-03-19 NOTE — Progress Notes (Signed)
[facility]                                                                              Patient Demographics  Barry Dean, is a 82 y.o. male, DOB - May 17, 1924, [id number]  Admit date - 03/12/2016   Admitting Physician Waldemar Dickens, MD  Outpatient Primary MD for the patient is [name], MD  Outpatient specialists:   LOS - 7  days    Chief Complaint  Patient presents with  . Loss of Consciousness       Brief summary   82 y.o. M Hx Anxiety, Depression, Parkinson's, BPH, MV Regurg, HTN, A. Fib, and GERD who presented from his assisted living facility due to acute encephalopathy. Initial evaluation revealed acute respiratory failure with hypoxia, hypotension, acute kidney injury, elevated lactic acid, and chest x-ray was concerning for pneumonia.   Assessment & Plan   SIRS With lactic acidosis - resolved  -clinically appears to have been SIRS related to severe dehydration in the setting of ongoing watery diarrhea rather than an acute infection/sepsis   Acute hypoxic resp failure  -I suspect the patient likely had an initial episode of aspiration in the setting of recurrent vomiting prior to his admit  - f/u CXR 7/20 was profoundly worse but in the setting of aggressive volume resuscitation, antibiotics were discontinued after 3 days of treatment, and the pt was initially improving  - Still on high flow O2 8 L, wean O2 as tolerated, chest x-ray showed persistent diffuse right lung infiltrate and left lower lobe infiltrates, small left pleural effusion however no fevers or chills or leukocytosis - Still 10 L positive on I/O's, patient received 2 doses of IV Lasix on 7/23 . If patient spikes any fevers or worsening leukocytosis, will place on antibiotics  Acute kidney injury - resolved -Cr 1.54 on admission, related to extreme dehydration in setting of nausea vomiting diarrhea  Acute encephalopathy -CT head without acute abnormalities - [id number]  and folic acid normal  - per Dr Thereasa Solo- the patient has now returned to his baseline mental status. Somewhat somnolent today however easily arousable  Chronic Grade 2 Diastolic CHF 2-D echo showed EF of 60-65% with grade 2 diastolic dysfunction on [id number] Chest x-ray with small pleural effusion, will give 1 dose of Lasix today, then will reassess in a.m. received 2 doses of Lasix on 7/23  Watery Diarrhea - Rectal tube in place, C. difficile negative, GI pathogen panel negative - Placed on cholestyramine for 3 doses, continue loperamide  Nausea and vomiting - resolved  -appears to have resolved for now   Hypokalemia  -Due to GI losses, potassium stable  Hypertension -Blood pressure stable  Code Status: dnr  DVT Prophylaxis:  Lovenox Family Communication: No family member at the bedside  Disposition Plan: Skilled nursing facility when diarrhea improves and patient alert and oriented  Time Spent in minutes  25 minutes  Procedures:  TTE - 7/19 - EF 60-65% - no WMA - grade 2 DD  Consultants:   None  Antimicrobials:  Cefepime7/17 > 7/19 Vancomycin 7/17 > 7/19   Medications  Scheduled Meds: . amLODipine  10  mg Oral Daily  . aspirin  81 mg Oral QPM  . enoxaparin (LOVENOX) injection  40 mg Subcutaneous Q24H  . feeding supplement  1 Container Oral TID BM  . lacosamide  150 mg Oral QHS  . lacosamide  50 mg Oral Daily  . lamoTRIgine  75 mg Oral BID  . loperamide  4 mg Oral Q8H  . pantoprazole  40 mg Oral Daily  . sodium chloride flush  3 mL Intravenous Q12H   Continuous Infusions:  PRN Meds:.acetaminophen **OR** acetaminophen, dextromethorphan-guaiFENesin, ipratropium-albuterol   Antibiotics   Anti-infectives    Start     Dose/Rate Route Frequency Ordered Stop   03/14/16 1300  vancomycin (VANCOCIN) IVPB 750 mg/150 ml premix     750 mg 150 mL/hr over 60 Minutes Intravenous Every 12 hours 03/14/16 1250 03/14/16 1500   03/13/16 1500  ceFEPIme (MAXIPIME) 2 g in  dextrose 5 % 50 mL IVPB     2 g 100 mL/hr over 30 Minutes Intravenous Every 24 hours 03/12/16 1418 03/14/16 1551   03/13/16 1430  vancomycin (VANCOCIN) 1,250 mg in sodium chloride 0.9 % 250 mL IVPB  Status:  Discontinued     1,250 mg 166.7 mL/hr over 90 Minutes Intravenous Every 24 hours 03/12/16 1418 03/14/16 1250   03/12/16 1400  vancomycin (VANCOCIN) 2,000 mg in sodium chloride 0.9 % 500 mL IVPB     2,000 mg 250 mL/hr over 120 Minutes Intravenous  Once 03/12/16 1345 03/12/16 1610   03/12/16 1345  ceFEPIme (MAXIPIME) 2 g in dextrose 5 % 50 mL IVPB     2 g 100 mL/hr over 30 Minutes Intravenous  Once 03/12/16 1336 03/12/16 1450   03/12/16 1345  vancomycin (VANCOCIN) IVPB 1000 mg/200 mL premix  Status:  Discontinued     1,000 mg 200 mL/hr over 60 Minutes Intravenous  Once 03/12/16 1336 03/12/16 1347        Subjective:   Barry Dean was seen and examined today. Somewhat somnolent however arousable but did not respond to my review of systems. Had some tangential conversation. No fevers or chills. Still on high flow 8 L.  No acute events overnight.    Objective:   Vitals:   03/19/16 0940 03/19/16 1217 03/19/16 1218 03/19/16 1220  BP: (!) 156/70     Pulse: 89  84 83  Resp: 18 17    Temp:      TempSrc:      SpO2: 92%  (!) 80% 92%  Weight:      Height:        Intake/Output Summary (Last 24 hours) at 03/19/16 1226 Last data filed at 03/19/16 0934  Gross per 24 hour  Intake              880 ml  Output              700 ml  Net              180 ml     Wt Readings from Last 3 Encounters:  03/19/16 92.1 kg (203 lb)  01/31/16 95.3 kg (210 lb 3.2 oz)  09/27/15 95.3 kg (210 lb)     Exam  General: Somnolent but arousable  HEENT:  PERRLA, EOMI, Anicteric Sclera, mucous membranes moist.   Neck: Supple, no JVD  Cardiovascular: S1 S2 auscultated, no rubs, murmurs or gallops. Regular rate and rhythm.  Respiratory: Decreased breath sounds at the bases  Gastrointestinal:  Soft, nontender, nondistended, + bowel sounds  Ext: no  cyanosis clubbing or edema  Neuro: did not cooperate  Skin: No rashes  Psych somnolent   Data Reviewed:  I have personally reviewed following labs and imaging studies  Micro Results Recent Results (from the past 240 hour(s))  Blood culture (routine x 2)     Status: None   Collection Time: 03/12/16 12:17 PM  Result Value Ref Range Status   Specimen Description BLOOD RIGHT ANTECUBITAL  Final   Special Requests BOTTLES DRAWN AEROBIC AND ANAEROBIC  5CC  Final   Culture NO GROWTH 5 DAYS  Final   Report Status 03/17/2016 FINAL  Final  Blood culture (routine x 2)     Status: None   Collection Time: 03/12/16 12:31 PM  Result Value Ref Range Status   Specimen Description BLOOD LEFT HAND  Final   Special Requests BOTTLES DRAWN AEROBIC AND ANAEROBIC  5CC  Final   Culture NO GROWTH 5 DAYS  Final   Report Status 03/17/2016 FINAL  Final  Urine culture     Status: None   Collection Time: 03/12/16  2:14 PM  Result Value Ref Range Status   Specimen Description URINE, RANDOM  Final   Special Requests NONE  Final   Culture NO GROWTH  Final   Report Status 03/13/2016 FINAL  Final  Gastrointestinal Panel by PCR , Stool     Status: None   Collection Time: 03/12/16  6:34 PM  Result Value Ref Range Status   Campylobacter species NOT DETECTED NOT DETECTED Final   Plesimonas shigelloides NOT DETECTED NOT DETECTED Final   Salmonella species NOT DETECTED NOT DETECTED Final   Yersinia enterocolitica NOT DETECTED NOT DETECTED Final   Vibrio species NOT DETECTED NOT DETECTED Final   Vibrio cholerae NOT DETECTED NOT DETECTED Final   Enteroaggregative E coli (EAEC) NOT DETECTED NOT DETECTED Final   Enteropathogenic E coli (EPEC) NOT DETECTED NOT DETECTED Final   Enterotoxigenic E coli (ETEC) NOT DETECTED NOT DETECTED Final   Shiga like toxin producing E coli (STEC) NOT DETECTED NOT DETECTED Final   E. coli O157 NOT DETECTED NOT DETECTED Final    Shigella/Enteroinvasive E coli (EIEC) NOT DETECTED NOT DETECTED Final   Cryptosporidium NOT DETECTED NOT DETECTED Final   Cyclospora cayetanensis NOT DETECTED NOT DETECTED Final   Entamoeba histolytica NOT DETECTED NOT DETECTED Final   Giardia lamblia NOT DETECTED NOT DETECTED Final   Adenovirus F40/41 NOT DETECTED NOT DETECTED Final   Astrovirus NOT DETECTED NOT DETECTED Final   Norovirus GI/GII NOT DETECTED NOT DETECTED Final   Rotavirus A NOT DETECTED NOT DETECTED Final   Sapovirus (I, II, IV, and V) NOT DETECTED NOT DETECTED Final  MRSA PCR Screening     Status: Abnormal   Collection Time: 03/12/16  7:16 PM  Result Value Ref Range Status   MRSA by PCR POSITIVE (A) NEGATIVE Final    Comment:        The GeneXpert MRSA Assay (FDA approved for NASAL specimens only), is one component of a comprehensive MRSA colonization surveillance program. It is not intended to diagnose MRSA infection nor to guide or monitor treatment for MRSA infections. RESULT CALLED TO, READ BACK BY AND VERIFIED WITH: M [facility] RN [id number] 03/12/16 A BROWNING   C difficile quick scan w PCR reflex     Status: None   Collection Time: 03/13/16 11:14 AM  Result Value Ref Range Status   C Diff antigen NEGATIVE NEGATIVE Final   C Diff toxin NEGATIVE NEGATIVE Final   C Diff interpretation  No C. difficile detected.  Final    Radiology Reports Ct Abdomen Pelvis Wo Contrast  Result Date: 03/12/2016 CLINICAL DATA:  Patient found unresponsive and diaphoretic today. Recurrent diarrhea and hypotension today. Initial encounter. EXAM: CT ABDOMEN AND PELVIS WITHOUT CONTRAST TECHNIQUE: Multidetector CT imaging of the abdomen and pelvis was performed following the standard protocol without IV contrast. COMPARISON:  CT abdomen and pelvis 09/13/2014. FINDINGS: Large hiatal hernia is seen. Left worse than right basilar atelectasis. Micronodularity in the lung bases is chronic and compatible with some prior infectious or inflammatory  process. There appears to be sludge versus gravel type stones dependently within an otherwise unremarkable gallbladder. The liver, spleen, adrenal glands, pancreas and kidneys appear normal. Aortoiliac atherosclerosis is identified. No fluid collection or lymphadenopathy is seen. There is no evidence of bowel obstruction. Liquid type stool is seen in the colon. Rectal tube is in place. No lymphadenopathy or fluid is seen. No focal bony abnormality is identified. Lumbar scoliosis and scattered spondylosis noted. IMPRESSION: Liquid type stool throughout the colon compatible with diarrhea. No other acute abnormality is seen. Atherosclerosis. Large hiatal hernia with an intrathoracic stomach. Associated left basilar atelectasis is noted. Possible gallbladder sludge or tiny stones without evidence of cholecystitis. Electronically Signed   By: Inge Rise M.D.   On: 03/12/2016 15:41   Ct Head Wo Contrast  Result Date: 03/12/2016 CLINICAL DATA:  Mental status changes today. EXAM: CT HEAD WITHOUT CONTRAST TECHNIQUE: Contiguous axial images were obtained from the base of the skull through the vertex without intravenous contrast. COMPARISON:  Head CT 03/05/2015 FINDINGS: Stable age related cerebral atrophy, ventriculomegaly and periventricular white matter disease. No extra-axial fluid collections are identified. No CT findings for acute hemispheric infarction or intracranial hemorrhage. No mass lesions. The brainstem and cerebellum are normal. The bony structures are intact. No acute fracture or bone lesion. The paranasal sinuses and mastoid air cells are grossly clear. The globes are intact. IMPRESSION: Stable age related cerebral atrophy, ventriculomegaly and periventricular white matter disease. No acute intracranial findings or mass lesion. Electronically Signed   By: Marijo Sanes M.D.   On: 03/12/2016 15:31   Dg Chest Port 1 View  Result Date: 03/19/2016 CLINICAL DATA:  Pneumonia.  Pulmonary edema. EXAM:  PORTABLE CHEST 1 VIEW COMPARISON:  03/17/2016 . FINDINGS: Patient is rotated to the left. Right paratracheal soft tissue prominence is noted. Although this may be related to patient rotation prominent great vessels are right paratracheal/ mediastinal mass cannot be excluded. Upright PA and lateral chest x-ray suggested for further evaluation. Diffuse right lung and left lower lobe infiltrates are present. Small left pleural effusion present. Similar findings noted on prior exam. Stable cardiomegaly. Stable right apical pleural thickening. A prominent hiatal hernia is been previously noted. No pneumothorax. IMPRESSION: 1. Soft tissue prominence is in the right peritracheal/ upper mediastinal region. Findings may be related to patient rotation and AP technique/great vessels. A PA and lateral chest x-ray is suggested to further evaluate inter to exclude a right paratracheal/mediastinal mass. 2. Persistent diffuse right lung infiltrate and left lower lobe infiltrate. Small left pleural effusion. 3. Stable cardiomegaly. Electronically Signed   By: Marcello Moores  Register   On: 03/19/2016 07:25  Dg Chest Port 1 View  Result Date: 03/17/2016 CLINICAL DATA:  Pneumonia, hypertension, atrial fibrillation. EXAM: PORTABLE CHEST 1 VIEW COMPARISON:  03/15/2016. FINDINGS: Patient is rotated. Heart size is grossly stable. There is patchy airspace opacification throughout the right hemi thorax. Opacification projecting over the heart may be due to  a large hiatal hernia, as seen previously. No definite pleural fluid. IMPRESSION: 1. Patchy opacification throughout the right hemi thorax is worrisome for pneumonia. 2. Large hiatal hernia. Electronically Signed   By: Lorin Picket M.D.   On: 03/17/2016 11:15   Dg Chest Port 1 View  Result Date: 03/15/2016 CLINICAL DATA:  Hypertension, atrial fibrillation, pneumonia EXAM: PORTABLE CHEST 1 VIEW COMPARISON:  03/12/2016 chest x-ray and CT abdomen pelvis FINDINGS: Very large hiatal hernia  noted with an air-fluid level. Interval development of severe asymmetric bilateral airspace disease versus edema, involving the entire right lung and the lingula and left lower lobe. Given the acute rapid interval change compared to 3 days ago, acute edema is favored over severe pneumonia. No large effusion or pneumothorax. Aorta is atherosclerotic. IMPRESSION: Acute development of severe asymmetric bilateral airspace disease, compatible with acute alveolar edema versus severe bilateral pneumonia. Very large hiatal hernia Aortic atherosclerosis Electronically Signed   By: Jerilynn Mages.  Shick M.D.   On: 03/15/2016 18:48   Dg Chest Portable 1 View  Result Date: 03/12/2016 CLINICAL DATA:  Altered mental status with nausea and vomiting EXAM: PORTABLE CHEST 1 VIEW COMPARISON:  April 30, 2014 and March 05, 2015 FINDINGS: There is bibasilar interstitial fibrosis. There is focal airspace consolidation in the left lower lobe. Heart is mildly enlarged with pulmonary vascularity within normal limits. There is atherosclerotic calcification in the aorta. No adenopathy. There is an old healed fracture of the proximal left humerus. IMPRESSION: Left lower lobe airspace consolidation. Bibasilar interstitial fibrosis. Stable mild cardiomegaly. There is aortic atherosclerosis. Electronically Signed   By: Lowella Grip III M.D.   On: 03/12/2016 12:49   Dg Swallowing Func-speech Pathology  Result Date: 03/13/2016 Objective Swallowing Evaluation: Type of Study: MBS-Modified Barium Swallow Study Patient Details Name: LINCOLN CHRISTIANSEN MRN: [id number] Date of Birth: November 27, 1923 Today's Date: 03/13/2016 Time: SLP Start Time (ACUTE ONLY): 1320-SLP Stop Time (ACUTE ONLY): 1340 SLP Time Calculation (min) (ACUTE ONLY): 20 min Past Medical History: Past Medical History Diagnosis Date . Hypertension  . Seizures ([place])  . Dehydration  . Deafness  . Anxiety  . Reflux  . Arthritis  . Depression  . Atrial fibrillation ([place])  . Tremor, essential  .  Seizure ([place])  . BPH (benign prostatic hyperplasia)  . Syncope  . Melanoma ([place])  . GERD (gastroesophageal reflux disease)  . Mitral regurgitation  . Abnormality of gait  . Tremor 09/27/2015 Past Surgical History: Past Surgical History Procedure Laterality Date . Unknown   . Appendectomy   . Cataract extraction w/ intraocular lens  implant, bilateral   . Esophagogastroduodenoscopy (egd) with propofol N/A 09/15/2014   Procedure: ESOPHAGOGASTRODUODENOSCOPY (EGD) WITH PROPOFOL;  Surgeon: [name], MD;  Location: [facility] ENDOSCOPY;  Service: Endoscopy;  Laterality: N/A; HPI: KALEE BURSEY is a 82 y.o. male with a Past Medical History of Parkinson's, HTN, SZR, deafness, reflux, A. fib, BPH, GERD, who presents with sepsis likely secondary to PNA (LLL HCAP vs aspiration). Patient presents from nursing home orders likely also picked up a viral gastroenteritis causing severe dehydration. BEdside swallow evaluations 03/29/14, 11/11/12 indicate normal swallowing function. MBS 07/20/11 with a mild pharyngeal dysphagia with recommendations for regular solids, thin liquids. No Data Recorded Assessment / Plan / Recommendation CHL IP CLINICAL IMPRESSIONS 03/13/2016 Therapy Diagnosis Mild pharyngeal phase dysphagia Clinical Impression Patient presents with a very mild pharyngeal phase dysphagia characterized by deep flash penetration of thin liquids (in reality largely normal for age) but without frank penetration or aspiration. Cannot  f/o deep penetration and/or aspiration over the course of a meal. Degree and frequency of  penetration decreased with use of cup sip rather than straw. When patient ready to advance solids, recommend dysphagia 3 (per daughter wishes due to mastication difficulty reported at ALF), thin liquids with aspiration precautions.   Impact on safety and function Mild aspiration risk   CHL IP TREATMENT RECOMMENDATION 03/13/2016 Treatment Recommendations Therapy as outlined in treatment plan below   Prognosis  03/13/2016 Prognosis for Safe Diet Advancement Good Barriers to Reach Goals -- Barriers/Prognosis Comment -- CHL IP DIET RECOMMENDATION 03/13/2016 SLP Diet Recommendations Dysphagia 3 (Mech soft) solids;Thin liquid Liquid Administration via Cup;No straw Medication Administration Whole meds with liquid Compensations Slow rate;Small sips/bites Postural Changes Seated upright at 90 degrees   CHL IP OTHER RECOMMENDATIONS 03/13/2016 Recommended Consults -- Oral Care Recommendations Oral care BID Other Recommendations --   CHL IP FOLLOW UP RECOMMENDATIONS 03/13/2016 Follow up Recommendations None   CHL IP FREQUENCY AND DURATION 03/13/2016 Speech Therapy Frequency (ACUTE ONLY) min 1 x/week Treatment Duration 1 week      CHL IP ORAL PHASE 03/13/2016 Oral Phase WFL Oral - Pudding Teaspoon -- Oral - Pudding Cup -- Oral - Honey Teaspoon -- Oral - Honey Cup -- Oral - Nectar Teaspoon -- Oral - Nectar Cup -- Oral - Nectar Straw -- Oral - Thin Teaspoon -- Oral - Thin Cup -- Oral - Thin Straw -- Oral - Puree -- Oral - Mech Soft -- Oral - Regular -- Oral - Multi-Consistency -- Oral - Pill -- Oral Phase - Comment --  CHL IP PHARYNGEAL PHASE 03/13/2016 Pharyngeal Phase Impaired Pharyngeal- Pudding Teaspoon -- Pharyngeal -- Pharyngeal- Pudding Cup -- Pharyngeal -- Pharyngeal- Honey Teaspoon -- Pharyngeal -- Pharyngeal- Honey Cup -- Pharyngeal -- Pharyngeal- Nectar Teaspoon -- Pharyngeal -- Pharyngeal- Nectar Cup -- Pharyngeal -- Pharyngeal- Nectar Straw -- Pharyngeal -- Pharyngeal- Thin Teaspoon -- Pharyngeal -- Pharyngeal- Thin Cup Delayed swallow initiation-pyriform sinuses;Delayed swallow initiation-vallecula;Penetration/Aspiration before swallow Pharyngeal Material enters airway, remains ABOVE vocal cords then ejected out Pharyngeal- Thin Straw Delayed swallow initiation-vallecula;Delayed swallow initiation-pyriform sinuses;Penetration/Aspiration before swallow Pharyngeal Material enters airway, remains ABOVE vocal cords then ejected out  Pharyngeal- Puree WFL Pharyngeal -- Pharyngeal- Mechanical Soft WFL Pharyngeal -- Pharyngeal- Regular -- Pharyngeal -- Pharyngeal- Multi-consistency -- Pharyngeal -- Pharyngeal- Pill WFL Pharyngeal -- Pharyngeal Comment --  CHL IP CERVICAL ESOPHAGEAL PHASE 03/13/2016 Cervical Esophageal Phase WFL Pudding Teaspoon -- Pudding Cup -- Honey Teaspoon -- Honey Cup -- Nectar Teaspoon -- Nectar Cup -- Nectar Straw -- Thin Teaspoon -- Thin Cup -- Thin Straw -- Puree -- Mechanical Soft -- Regular -- Multi-consistency -- Pill -- Cervical Esophageal Comment -- No flowsheet data found. Gabriel Rainwater MA, CCC-SLP [phone] [place] Leah Meryl 03/13/2016, 1:56 PM               Lab Data:  CBC:  Recent Labs Lab 03/14/16 0030 03/15/16 0331 03/17/16 0210 03/18/16 0225 03/19/16 0458  WBC 7.7 8.2 7.3 6.5 5.5  NEUTROABS 5.7  --   --   --   --   HGB 9.7* 10.1* 10.3* 9.9* 9.5*  HCT 29.9* 29.9* 30.5* 29.7* 29.3*  MCV 89.3 87.4 86.9 87.9 88.5  PLT 185 208 231 226 [id number]   Basic Metabolic Panel:  Recent Labs Lab 03/14/16 0030 03/15/16 0331 03/15/16 1505 03/15/16 2229 03/17/16 0210 03/18/16 0225 03/19/16 0458  NA 132* 131*  --   --  129* 130* 131*  K 3.4* 3.1* 3.3* 3.1* 3.3* 3.5 3.8  CL 107 103  --   --  93* 93* 95*  CO2 21* 23  --   --  28 30 31   GLUCOSE 129* 85  --   --  105* 119* 98  BUN 17 8  --   --  9 14 18   CREATININE 0.91 0.75  --   --  0.86 0.88 0.88  CALCIUM 7.9* 7.8*  --   --  7.9* 8.1* 8.3*  MG 1.8  --  1.6* 2.0  --   --   --    GFR: Estimated Creatinine Clearance: 63.1 mL/min (by C-G formula based on SCr of 0.88 mg/dL). Liver Function Tests:  Recent Labs Lab 03/13/16 0319 03/14/16 0030 03/15/16 0331 03/17/16 0210  AST 57* 83* 87* 51*  ALT 32 58 84* 59  ALKPHOS 60 47 58 53  BILITOT 0.6 0.7 0.8 0.7  PROT 5.2* 4.8* 4.9* 5.2*  ALBUMIN 3.0* 2.5* 2.7* 2.5*   No results for input(s): LIPASE, AMYLASE in the last 168 hours. No results for input(s): AMMONIA in the last 168  hours. Coagulation Profile: No results for input(s): INR, PROTIME in the last 168 hours. Cardiac Enzymes: No results for input(s): CKTOTAL, CKMB, CKMBINDEX, TROPONINI in the last 168 hours. BNP (last 3 results) No results for input(s): PROBNP in the last 8760 hours. HbA1C: No results for input(s): HGBA1C in the last 72 hours. CBG:  Recent Labs Lab 03/17/16 0815  GLUCAP 115*   Lipid Profile: No results for input(s): CHOL, HDL, LDLCALC, TRIG, CHOLHDL, LDLDIRECT in the last 72 hours. Thyroid Function Tests: No results for input(s): TSH, T4TOTAL, FREET4, T3FREE, THYROIDAB in the last 72 hours. Anemia Panel: No results for input(s): VITAMINB12, FOLATE, FERRITIN, TIBC, IRON, RETICCTPCT in the last 72 hours. Urine analysis:    Component Value Date/Time   COLORURINE YELLOW 03/12/2016 1414   APPEARANCEUR CLEAR 03/12/2016 1414   LABSPEC 1.018 03/12/2016 1414   PHURINE 6.5 03/12/2016 1414   GLUCOSEU NEGATIVE 03/12/2016 1414   HGBUR NEGATIVE 03/12/2016 1414   BILIRUBINUR NEGATIVE 03/12/2016 1414   KETONESUR NEGATIVE 03/12/2016 1414   PROTEINUR NEGATIVE 03/12/2016 1414   UROBILINOGEN 1.0 11/12/2014 1132   NITRITE NEGATIVE 03/12/2016 1414   LEUKOCYTESUR NEGATIVE 03/12/2016 1414     Armanii Pressnell M.D. [facility] 03/19/2016, 12:26 PM  Pager: [phone] Between 7am to 7pm - call Pager - [phone]  After 7pm go to [email or web address] - password [email or web address]  Call night coverage person covering after 7pm

## 2016-03-19 NOTE — Progress Notes (Signed)
Physical Therapy Treatment Patient Details Name: Barry Dean MRN: [id number] DOB: [birth year] Today's Date: 03/19/2016    History of Present Illness Barry Dean is a 82 y.o. male with a Past Medical History of Parkinson's, HTN, SZR, deafness, reflux, A. fib, BPH, GERD, who presents with sepsis likely secondary to PNA (LLL HCAP vs aspiration). Patient presents from nursing home orders likely also picked up a viral gastroenteritis causing severe dehydration.     PT Comments    Patient is making gradual progress toward mobility goals. Tolerated gait training with RW and mod A +2 (for safety) for short distance in room. Pt reported he ambulated with RW and assistance "sometimes" prior to admission. Current plan remains appropriate.   Follow Up Recommendations  SNF;Supervision/Assistance - 24 hour     Equipment Recommendations  None recommended by PT    Recommendations for Other Services       Precautions / Restrictions Precautions Precautions: Fall;Other (comment) Precaution Comments: h/o seizures Restrictions Weight Bearing Restrictions: No    Mobility  Bed Mobility Overal bed mobility: Needs Assistance Bed Mobility: Sit to Supine       Sit to supine: Mod assist;+2 for physical assistance   General bed mobility comments: up in chair on arrival; mod A +2 to return to supine with asssistance needed to elevate bilat LE into bed and position trunk while descending; cues for sequencing and technique  Transfers Overall transfer level: Needs assistance Equipment used: Rolling walker (2 wheeled) Transfers: Sit to/from Stand Sit to Stand: Mod assist;+2 safety/equipment         General transfer comment: cues for safe hand placement and technique; assist to power up into standing and gain balance upon stand with pt leaning posteriorly   Ambulation/Gait Ambulation/Gait assistance: Mod assist;+2 safety/equipment Ambulation Distance (Feet): 16 Feet Assistive device:  Rolling walker (2 wheeled) Gait Pattern/deviations: Step-through pattern;Decreased stride length;Shuffle;Leaning posteriorly     General Gait Details: cues for proximity of RW, step length, and posture; pt maintained trunk/cervical flexion which pt reported is normal for him; assist for balance and guidance of RW; pt reported ambulating very short distances sometimes with assistance   Stairs            Wheelchair Mobility    Modified Rankin (Stroke Patients Only)       Balance   Sitting-balance support: No upper extremity supported;Feet supported Sitting balance-Leahy Scale: Fair       Standing balance-Leahy Scale: Poor                      Cognition Arousal/Alertness: Awake/alert Behavior During Therapy: Flat affect Overall Cognitive Status: No family/caregiver present to determine baseline cognitive functioning                      Exercises General Exercises - Lower Extremity Quad Sets:  (3 reps and then unable to understand/continue)    General Comments        Pertinent Vitals/Pain Pain Assessment: No/denies pain    Home Living                      Prior Function            PT Goals (current goals can now be found in the care plan section) Acute Rehab PT Goals Patient Stated Goal: none stated Time For Goal Achievement: 03/27/16 Progress towards PT goals: Progressing toward goals    Frequency  Min 2X/week    PT  Plan Current plan remains appropriate    Co-evaluation             End of Session Equipment Utilized During Treatment: Gait belt;Oxygen Activity Tolerance: Patient limited by fatigue Patient left: with call bell/phone within reach;in bed;with bed alarm set     Time: [id number] PT Time Calculation (min) (ACUTE ONLY): 21 min  Charges:  $Gait Training: 8-22 mins                    G Codes:      Salina April, PTA Pager: [phone]   03/19/2016, 3:22 PM

## 2016-03-19 NOTE — Progress Notes (Signed)
Nutrition Follow-up  DOCUMENTATION CODES:   Not applicable  INTERVENTION:   -D/c Boost Breeze po TID, each supplement provides 250 kcal and 9 grams of protein -Ensure Enlive po daily, each supplement provides 350 kcal and 20 grams of protein  NUTRITION DIAGNOSIS:   Inadequate oral intake related to altered GI function as evidenced by  (per report).  Progressing  GOAL:   Patient will meet greater than or equal to 90% of their needs  Progressing  MONITOR:   PO intake, Supplement acceptance, Diet advancement, I & O's, Skin  REASON FOR ASSESSMENT:   Low Braden    ASSESSMENT:   Pt with hx of being very HOH, Parkinson's, HTN, anxiety, depression admitted from SNF with acute encephalopathy with sepsis possible  PNA, nausea, vomiting and severe diarrhea with extreme dehydration from possible viral gastroenteritis.   Pt underwent MBSS and BSE on 03/13/16; recommended dysphagia 3 diet with thin liquids. Noted meal completion 85-100%. Pt consumed about 25% of Boost Breeze supplement at bedside.   Pt sitting in bed at time of visit; he did not answer this RD's questions at time of visit. He replied "I'm watching Gunsmoke".   CSW following. Plan to d/c to SNF once medically stable.   Labs reviewed: Na: 131.  Diet Order:  DIET DYS 3 Room service appropriate?: Yes; Fluid consistency:: Thin  Skin:  Reviewed, no issues  Last BM:  03/19/16  Height:   Ht Readings from Last 1 Encounters:  03/12/16 6\' 1"  (1.854 m)    Weight:   Wt Readings from Last 1 Encounters:  03/19/16 203 lb (92.1 kg)    Ideal Body Weight:  83.6 kg  BMI:  Body mass index is 26.78 kg/m.  Estimated Nutritional Needs:   Kcal:  2000-2200  Protein:  100-115 grams  Fluid:  > 2 L/day  EDUCATION NEEDS:   No education needs identified at this time  Denielle Bayard A. Jimmye Norman, RD, LDN, CDE Pager: [phone] After hours Pager: [phone]

## 2016-03-20 MED ORDER — CHOLESTYRAMINE LIGHT 4 G PO PACK
4.0000 g | PACK | Freq: Two times a day (BID) | ORAL | Status: AC
  Administered 2016-03-20 – 2016-03-21 (×3): 4 g via ORAL
  Filled 2016-03-20 (×3): qty 1

## 2016-03-20 MED ORDER — AMOXICILLIN-POT CLAVULANATE 875-125 MG PO TABS
1.0000 | ORAL_TABLET | Freq: Two times a day (BID) | ORAL | Status: DC
  Administered 2016-03-20 – 2016-03-23 (×7): 1 via ORAL
  Filled 2016-03-20 (×7): qty 1

## 2016-03-20 MED ORDER — FUROSEMIDE 10 MG/ML IJ SOLN
40.0000 mg | Freq: Every day | INTRAMUSCULAR | Status: AC
  Administered 2016-03-20 – 2016-03-21 (×2): 40 mg via INTRAVENOUS
  Filled 2016-03-20 (×2): qty 4

## 2016-03-20 NOTE — Care Management Important Message (Signed)
Important Message  Patient Details  Name: Barry Dean MRN: [id number] Date of Birth: 03-26-1924   Medicare Important Message Given:  Yes    Carles Collet, RN 03/20/2016, 12:23 PM

## 2016-03-20 NOTE — Care Management Note (Addendum)
Case Management Note  Patient Details  Name: GEAROLD PIER MRN: [id number] Date of Birth: 1924/06/13  Subjective/Objective:                 Patient admitted from [facility] fro Sepsis/ PNA. Patient experiencing fever to 101 last night, and fluid volume overload. Continues supplimental O2, non dependent PTA, IV Lasix.    Action/Plan:  Per CSW ALf agreeable to patient's return.  Likely will need HH at ALF.  Spoke with [facility]. They use [facility] for oxygen, and use [facility] for [facility]. CM will make referrals at DC when needs are clear.   Expected Discharge Date:                  Expected Discharge Plan:  Assisted Living / Rest Home  In-House Referral:  Clinical Social Work  Discharge planning Services  CM Consult  Post Acute Care Choice:    Choice offered to:     DME Arranged:    DME Agency:     HH Arranged:    HH Agency:     Status of Service:     If discussed at [employer], dates discussed:    Additional Comments:  Carles Collet, RN 03/20/2016, 2:50 PM

## 2016-03-20 NOTE — Progress Notes (Signed)
Occupational Therapy Treatment Patient Details Name: Barry Dean MRN: [id number] DOB: [birth year] Today's Date: 03/20/2016    History of present illness Barry Dean is a 82 y.o. male with a Past Medical History of Parkinson's, HTN, SZR, deafness, reflux, A. fib, BPH, GERD, who presents with sepsis likely secondary to PNA (LLL HCAP vs aspiration). Patient presents from nursing home orders likely also picked up a viral gastroenteritis causing severe dehydration.    OT comments  Pt progressing slowly with ADL's. Pt seen for ADL retraining session today with focus on sit to stand & SPT as well as grooming while seated in recliner. Discharge plan remains appropriate.   Follow Up Recommendations  SNF    Equipment Recommendations  None recommended by OT    Recommendations for Other Services      Precautions / Restrictions Precautions Precautions: Fall;Other (comment) Precaution Comments: h/o seizures Restrictions Weight Bearing Restrictions: No       Mobility Bed Mobility Overal bed mobility: Needs Assistance Bed Mobility: Sit to Supine     Supine to sit: Mod assist        Transfers Overall transfer level: Needs assistance Equipment used: Rolling walker (2 wheeled) Transfers: Sit to/from Stand Sit to Stand: Mod assist;+2 safety/equipment Stand pivot transfers: Mod assist       General transfer comment: cues for safe hand placement and technique; assist to power up into standing and gain balance upon standing     Balance                                   ADL Overall ADL's : Needs assistance/impaired     Grooming: Wash/dry hands;Wash/dry face;Set up;Sitting                   Toilet Transfer: Moderate assistance;+2 for safety/equipment;Stand-pivot Toilet Transfer Details (indicate cue type and reason): Mod A sit to stand for SPT to recliner chair/simulated toilet transfer to 3:1           General ADL Comments: Pt moves slowly &  slow to initiate movement/activity. Pt did better after hearing aids were applied.  Truncal rigidity/stiffness noted. Self care for grooming while seated after transfer today.      Vision  Wears glasses at all times; no change from baseline per pt report. Assisted pt by cleaning glasses today after grooming.                   Perception     Praxis      Cognition   Behavior During Therapy: Flat affect Overall Cognitive Status: Within Functional Limits for tasks assessed                       Extremity/Trunk Assessment               Exercises     Shoulder Instructions       General Comments      Pertinent Vitals/ Pain       Pain Assessment: No/denies pain  Home Living                                          Prior Functioning/Environment              Frequency Min 2X/week     Progress  Toward Goals  OT Goals(current goals can now be found in the care plan section)  Progress towards OT goals: Progressing toward goals  Acute Rehab OT Goals Patient Stated Goal: none stated  Plan Discharge plan remains appropriate    Co-evaluation                 End of Session Equipment Utilized During Treatment: Oxygen   Activity Tolerance Patient tolerated treatment well   Patient Left in chair;with call bell/phone within reach;with chair alarm set   Nurse Communication          Time: [phone] OT Time Calculation (min): 24 min  Charges: OT General Charges $OT Visit: 1 Procedure OT Treatments $Self Care/Home Management : 8-22 mins $Therapeutic Activity: 8-22 mins  [place], Amy Beth Dixon, OTR/L 03/20/2016, 10:33 AM

## 2016-03-20 NOTE — Progress Notes (Signed)
[facility]                                                                              Patient Demographics  Barry Dean, is a 82 y.o. male, DOB - [birth year], [id number]  Admit date - 03/12/2016   Admitting Physician Waldemar Dickens, MD  Outpatient Primary MD for the patient is [name], MD  Outpatient specialists:   LOS - 8  days    Chief Complaint  Patient presents with  . Loss of Consciousness       Brief summary   82 y.o. M Hx Anxiety, Depression, Parkinson's, BPH, MV Regurg, HTN, A. Fib, and GERD who presented from his assisted living facility due to acute encephalopathy. Initial evaluation revealed acute respiratory failure with hypoxia, hypotension, acute kidney injury, elevated lactic acid, and chest x-ray was concerning for pneumonia. Patient transferred from stepdown on 7/24   Assessment & Plan   SIRS With lactic acidosis - resolved  -clinically appears to have been SIRS related to severe dehydration in the setting of ongoing watery diarrhea rather than an acute infection/sepsis   Acute hypoxic resp failure  -suspect the patient likely had an initial episode of aspiration in the setting of recurrent vomiting prior to his admit  - f/u CXR 7/20 was profoundly worse but in the setting of aggressive volume resuscitation, antibiotics were discontinued after 3 days of treatment, pt was initially improving  - Currently O2 sats 92% on 4 L, continued to wean O2 as tolerated, chest x-ray showed persistent diffuse right lung infiltrate and left lower lobe infiltrates, small left pleural effusion however no fevers or chills or leukocytosis - Still 9 L positive on I/O's, patient received 2 doses of IV Lasix on 7/23, continue IV Lasix today and tomorrow. Monitor creatinine closely. - Overnight spiked low-grade fevers, will place on Augmentin, chest x-ray in am  Acute kidney injury - resolved -Cr 1.54 on admission, related to extreme  dehydration in setting of nausea vomiting diarrhea  Acute encephalopathy - Much more alert and awake today, oriented to self -CT head without acute abnormalities - [id number] and folic acid normal   Acute on Chronic Grade 2 Diastolic CHF likely due to pulmonary edema from aggressive volume resuscitation - 2-D echo showed EF of 60-65% with grade 2 diastolic dysfunction on [id number] - Still 9 L positive on I/O's, patient received 2 doses of IV Lasix on 7/23, continue IV Lasix today and tomorrow. Monitor creatinine closely.  Watery Diarrhea - Rectal tube in place, C. difficile negative, GI pathogen panel negative - Placed on cholestyramine for 3 doses, continue loperamide  Nausea and vomiting - resolved  -appears to have resolved for now   Hypokalemia  -Due to GI losses, potassium stable  Hypertension -Blood pressure stable  Code Status: dnr  DVT Prophylaxis:  Lovenox Family Communication: No family member at the bedside  Disposition Plan: Skilled nursing facility when diarrhea improves  Time Spent in minutes  25 minutes  Procedures:  TTE - 7/19 - EF 60-65% - no WMA - grade 2 DD  Consultants:   None  Antimicrobials:  Cefepime7/17 > 7/19 Vancomycin 7/17 >  7/19   Medications  Scheduled Meds: . amLODipine  10 mg Oral Daily  . aspirin  81 mg Oral QPM  . cholestyramine light  4 g Oral BID  . enoxaparin (LOVENOX) injection  40 mg Subcutaneous Q24H  . feeding supplement (ENSURE ENLIVE)  237 mL Oral Q24H  . lacosamide  150 mg Oral QHS  . lacosamide  50 mg Oral Daily  . lamoTRIgine  75 mg Oral BID  . loperamide  4 mg Oral Q8H  . sodium chloride flush  3 mL Intravenous Q12H   Continuous Infusions:  PRN Meds:.acetaminophen **OR** acetaminophen, dextromethorphan-guaiFENesin, ipratropium-albuterol   Antibiotics   Anti-infectives    Start     Dose/Rate Route Frequency Ordered Stop   03/14/16 1300  vancomycin (VANCOCIN) IVPB 750 mg/150 ml premix     750 mg 150 mL/hr over 60  Minutes Intravenous Every 12 hours 03/14/16 1250 03/14/16 1500   03/13/16 1500  ceFEPIme (MAXIPIME) 2 g in dextrose 5 % 50 mL IVPB     2 g 100 mL/hr over 30 Minutes Intravenous Every 24 hours 03/12/16 1418 03/14/16 1551   03/13/16 1430  vancomycin (VANCOCIN) 1,250 mg in sodium chloride 0.9 % 250 mL IVPB  Status:  Discontinued     1,250 mg 166.7 mL/hr over 90 Minutes Intravenous Every 24 hours 03/12/16 1418 03/14/16 1250   03/12/16 1400  vancomycin (VANCOCIN) 2,000 mg in sodium chloride 0.9 % 500 mL IVPB     2,000 mg 250 mL/hr over 120 Minutes Intravenous  Once 03/12/16 1345 03/12/16 1610   03/12/16 1345  ceFEPIme (MAXIPIME) 2 g in dextrose 5 % 50 mL IVPB     2 g 100 mL/hr over 30 Minutes Intravenous  Once 03/12/16 1336 03/12/16 1450   03/12/16 1345  vancomycin (VANCOCIN) IVPB 1000 mg/200 mL premix  Status:  Discontinued     1,000 mg 200 mL/hr over 60 Minutes Intravenous  Once 03/12/16 1336 03/12/16 1347        Subjective:   Barry Dean was seen and examined today. Alert and awake, denies any specific complaints. No pain. Overnight spiking low-grade fever, Tmax 101F overnight. No nausea or vomiting, abdominal pain, chest pain or shortness of breath. Much more alert and oriented today.  Objective:   Vitals:   03/19/16 2122 03/19/16 2304 03/20/16 0542 03/20/16 0543  BP: (!) 140/56   (!) 115/48  Pulse: 89   71  Resp: 20   20  Temp: (!) 101 F (38.3 C) 100.3 F (37.9 C)  98.1 F (36.7 C)  TempSrc:  Oral  Oral  SpO2: 90%   92%  Weight:   91.7 kg (202 lb 1.6 oz)   Height:        Intake/Output Summary (Last 24 hours) at 03/20/16 1056 Last data filed at 03/20/16 0940  Gross per 24 hour  Intake              650 ml  Output             1525 ml  Net             -875 ml     Wt Readings from Last 3 Encounters:  03/20/16 91.7 kg (202 lb 1.6 oz)  01/31/16 95.3 kg (210 lb 3.2 oz)  09/27/15 95.3 kg (210 lb)     Exam  General: Alert and awake, oriented 2  HEENT:     Neck:   Cardiovascular: S1 S2 auscultated, no rubs, murmurs or gallops. Regular rate  and rhythm.  Respiratory: Decreased breath sounds at the bases  Gastrointestinal: Soft, nontender, nondistended, + bowel sounds  Ext: no cyanosis clubbing, trace edema  Neuro: did not cooperate  Skin: No rashes  Psych alert and oriented 2   Data Reviewed:  I have personally reviewed following labs and imaging studies  Micro Results Recent Results (from the past 240 hour(s))  Blood culture (routine x 2)     Status: None   Collection Time: 03/12/16 12:17 PM  Result Value Ref Range Status   Specimen Description BLOOD RIGHT ANTECUBITAL  Final   Special Requests BOTTLES DRAWN AEROBIC AND ANAEROBIC  5CC  Final   Culture NO GROWTH 5 DAYS  Final   Report Status 03/17/2016 FINAL  Final  Blood culture (routine x 2)     Status: None   Collection Time: 03/12/16 12:31 PM  Result Value Ref Range Status   Specimen Description BLOOD LEFT HAND  Final   Special Requests BOTTLES DRAWN AEROBIC AND ANAEROBIC  5CC  Final   Culture NO GROWTH 5 DAYS  Final   Report Status 03/17/2016 FINAL  Final  Urine culture     Status: None   Collection Time: 03/12/16  2:14 PM  Result Value Ref Range Status   Specimen Description URINE, RANDOM  Final   Special Requests NONE  Final   Culture NO GROWTH  Final   Report Status 03/13/2016 FINAL  Final  Gastrointestinal Panel by PCR , Stool     Status: None   Collection Time: 03/12/16  6:34 PM  Result Value Ref Range Status   Campylobacter species NOT DETECTED NOT DETECTED Final   Plesimonas shigelloides NOT DETECTED NOT DETECTED Final   Salmonella species NOT DETECTED NOT DETECTED Final   Yersinia enterocolitica NOT DETECTED NOT DETECTED Final   Vibrio species NOT DETECTED NOT DETECTED Final   Vibrio cholerae NOT DETECTED NOT DETECTED Final   Enteroaggregative E coli (EAEC) NOT DETECTED NOT DETECTED Final   Enteropathogenic E coli (EPEC) NOT DETECTED NOT DETECTED Final    Enterotoxigenic E coli (ETEC) NOT DETECTED NOT DETECTED Final   Shiga like toxin producing E coli (STEC) NOT DETECTED NOT DETECTED Final   E. coli O157 NOT DETECTED NOT DETECTED Final   Shigella/Enteroinvasive E coli (EIEC) NOT DETECTED NOT DETECTED Final   Cryptosporidium NOT DETECTED NOT DETECTED Final   Cyclospora cayetanensis NOT DETECTED NOT DETECTED Final   Entamoeba histolytica NOT DETECTED NOT DETECTED Final   Giardia lamblia NOT DETECTED NOT DETECTED Final   Adenovirus F40/41 NOT DETECTED NOT DETECTED Final   Astrovirus NOT DETECTED NOT DETECTED Final   Norovirus GI/GII NOT DETECTED NOT DETECTED Final   Rotavirus A NOT DETECTED NOT DETECTED Final   Sapovirus (I, II, IV, and V) NOT DETECTED NOT DETECTED Final  MRSA PCR Screening     Status: Abnormal   Collection Time: 03/12/16  7:16 PM  Result Value Ref Range Status   MRSA by PCR POSITIVE (A) NEGATIVE Final    Comment:        The GeneXpert MRSA Assay (FDA approved for NASAL specimens only), is one component of a comprehensive MRSA colonization surveillance program. It is not intended to diagnose MRSA infection nor to guide or monitor treatment for MRSA infections. RESULT CALLED TO, READ BACK BY AND VERIFIED WITH: M [facility] RN [id number] 03/12/16 A BROWNING   C difficile quick scan w PCR reflex     Status: None   Collection Time: 03/13/16 11:14 AM  Result Value  Ref Range Status   C Diff antigen NEGATIVE NEGATIVE Final   C Diff toxin NEGATIVE NEGATIVE Final   C Diff interpretation No C. difficile detected.  Final    Radiology Reports Ct Abdomen Pelvis Wo Contrast  Result Date: 03/12/2016 CLINICAL DATA:  Patient found unresponsive and diaphoretic today. Recurrent diarrhea and hypotension today. Initial encounter. EXAM: CT ABDOMEN AND PELVIS WITHOUT CONTRAST TECHNIQUE: Multidetector CT imaging of the abdomen and pelvis was performed following the standard protocol without IV contrast. COMPARISON:  CT abdomen and pelvis  09/13/2014. FINDINGS: Large hiatal hernia is seen. Left worse than right basilar atelectasis. Micronodularity in the lung bases is chronic and compatible with some prior infectious or inflammatory process. There appears to be sludge versus gravel type stones dependently within an otherwise unremarkable gallbladder. The liver, spleen, adrenal glands, pancreas and kidneys appear normal. Aortoiliac atherosclerosis is identified. No fluid collection or lymphadenopathy is seen. There is no evidence of bowel obstruction. Liquid type stool is seen in the colon. Rectal tube is in place. No lymphadenopathy or fluid is seen. No focal bony abnormality is identified. Lumbar scoliosis and scattered spondylosis noted. IMPRESSION: Liquid type stool throughout the colon compatible with diarrhea. No other acute abnormality is seen. Atherosclerosis. Large hiatal hernia with an intrathoracic stomach. Associated left basilar atelectasis is noted. Possible gallbladder sludge or tiny stones without evidence of cholecystitis. Electronically Signed   By: Inge Rise M.D.   On: 03/12/2016 15:41   Ct Head Wo Contrast  Result Date: 03/12/2016 CLINICAL DATA:  Mental status changes today. EXAM: CT HEAD WITHOUT CONTRAST TECHNIQUE: Contiguous axial images were obtained from the base of the skull through the vertex without intravenous contrast. COMPARISON:  Head CT 03/05/2015 FINDINGS: Stable age related cerebral atrophy, ventriculomegaly and periventricular white matter disease. No extra-axial fluid collections are identified. No CT findings for acute hemispheric infarction or intracranial hemorrhage. No mass lesions. The brainstem and cerebellum are normal. The bony structures are intact. No acute fracture or bone lesion. The paranasal sinuses and mastoid air cells are grossly clear. The globes are intact. IMPRESSION: Stable age related cerebral atrophy, ventriculomegaly and periventricular white matter disease. No acute intracranial  findings or mass lesion. Electronically Signed   By: Marijo Sanes M.D.   On: 03/12/2016 15:31   Dg Chest Port 1 View  Result Date: 03/19/2016 CLINICAL DATA:  Pneumonia.  Pulmonary edema. EXAM: PORTABLE CHEST 1 VIEW COMPARISON:  03/17/2016 . FINDINGS: Patient is rotated to the left. Right paratracheal soft tissue prominence is noted. Although this may be related to patient rotation prominent great vessels are right paratracheal/ mediastinal mass cannot be excluded. Upright PA and lateral chest x-ray suggested for further evaluation. Diffuse right lung and left lower lobe infiltrates are present. Small left pleural effusion present. Similar findings noted on prior exam. Stable cardiomegaly. Stable right apical pleural thickening. A prominent hiatal hernia is been previously noted. No pneumothorax. IMPRESSION: 1. Soft tissue prominence is in the right peritracheal/ upper mediastinal region. Findings may be related to patient rotation and AP technique/great vessels. A PA and lateral chest x-ray is suggested to further evaluate inter to exclude a right paratracheal/mediastinal mass. 2. Persistent diffuse right lung infiltrate and left lower lobe infiltrate. Small left pleural effusion. 3. Stable cardiomegaly. Electronically Signed   By: Marcello Moores  Register   On: 03/19/2016 07:25  Dg Chest Port 1 View  Result Date: 03/17/2016 CLINICAL DATA:  Pneumonia, hypertension, atrial fibrillation. EXAM: PORTABLE CHEST 1 VIEW COMPARISON:  03/15/2016. FINDINGS: Patient is rotated.  Heart size is grossly stable. There is patchy airspace opacification throughout the right hemi thorax. Opacification projecting over the heart may be due to a large hiatal hernia, as seen previously. No definite pleural fluid. IMPRESSION: 1. Patchy opacification throughout the right hemi thorax is worrisome for pneumonia. 2. Large hiatal hernia. Electronically Signed   By: Lorin Picket M.D.   On: 03/17/2016 11:15   Dg Chest Port 1 View  Result  Date: 03/15/2016 CLINICAL DATA:  Hypertension, atrial fibrillation, pneumonia EXAM: PORTABLE CHEST 1 VIEW COMPARISON:  03/12/2016 chest x-ray and CT abdomen pelvis FINDINGS: Very large hiatal hernia noted with an air-fluid level. Interval development of severe asymmetric bilateral airspace disease versus edema, involving the entire right lung and the lingula and left lower lobe. Given the acute rapid interval change compared to 3 days ago, acute edema is favored over severe pneumonia. No large effusion or pneumothorax. Aorta is atherosclerotic. IMPRESSION: Acute development of severe asymmetric bilateral airspace disease, compatible with acute alveolar edema versus severe bilateral pneumonia. Very large hiatal hernia Aortic atherosclerosis Electronically Signed   By: Jerilynn Mages.  Shick M.D.   On: 03/15/2016 18:48   Dg Chest Portable 1 View  Result Date: 03/12/2016 CLINICAL DATA:  Altered mental status with nausea and vomiting EXAM: PORTABLE CHEST 1 VIEW COMPARISON:  April 30, 2014 and March 05, 2015 FINDINGS: There is bibasilar interstitial fibrosis. There is focal airspace consolidation in the left lower lobe. Heart is mildly enlarged with pulmonary vascularity within normal limits. There is atherosclerotic calcification in the aorta. No adenopathy. There is an old healed fracture of the proximal left humerus. IMPRESSION: Left lower lobe airspace consolidation. Bibasilar interstitial fibrosis. Stable mild cardiomegaly. There is aortic atherosclerosis. Electronically Signed   By: Lowella Grip III M.D.   On: 03/12/2016 12:49   Dg Swallowing Func-speech Pathology  Result Date: 03/13/2016 Objective Swallowing Evaluation: Type of Study: MBS-Modified Barium Swallow Study Patient Details Name: KREE PRINZ MRN: [id number] Date of Birth: [birth year] Today's Date: 03/13/2016 Time: SLP Start Time (ACUTE ONLY): 1320-SLP Stop Time (ACUTE ONLY): 1340 SLP Time Calculation (min) (ACUTE ONLY): 20 min Past Medical History: Past  Medical History Diagnosis Date . Hypertension  . Seizures ([place])  . Dehydration  . Deafness  . Anxiety  . Reflux  . Arthritis  . Depression  . Atrial fibrillation ([place])  . Tremor, essential  . Seizure ([place])  . BPH (benign prostatic hyperplasia)  . Syncope  . Melanoma ([place])  . GERD (gastroesophageal reflux disease)  . Mitral regurgitation  . Abnormality of gait  . Tremor 09/27/2015 Past Surgical History: Past Surgical History Procedure Laterality Date . Unknown   . Appendectomy   . Cataract extraction w/ intraocular lens  implant, bilateral   . Esophagogastroduodenoscopy (egd) with propofol N/A 09/15/2014   Procedure: ESOPHAGOGASTRODUODENOSCOPY (EGD) WITH PROPOFOL;  Surgeon: [name], MD;  Location: [facility] ENDOSCOPY;  Service: Endoscopy;  Laterality: N/A; HPI: Barry Dean is a 82 y.o. male with a Past Medical History of Parkinson's, HTN, SZR, deafness, reflux, A. fib, BPH, GERD, who presents with sepsis likely secondary to PNA (LLL HCAP vs aspiration). Patient presents from nursing home orders likely also picked up a viral gastroenteritis causing severe dehydration. BEdside swallow evaluations 03/29/14, 11/11/12 indicate normal swallowing function. MBS 07/20/11 with a mild pharyngeal dysphagia with recommendations for regular solids, thin liquids. No Data Recorded Assessment / Plan / Recommendation CHL IP CLINICAL IMPRESSIONS 03/13/2016 Therapy Diagnosis Mild pharyngeal phase dysphagia Clinical Impression Patient presents with a very mild  pharyngeal phase dysphagia characterized by deep flash penetration of thin liquids (in reality largely normal for age) but without frank penetration or aspiration. Cannot f/o deep penetration and/or aspiration over the course of a meal. Degree and frequency of  penetration decreased with use of cup sip rather than straw. When patient ready to advance solids, recommend dysphagia 3 (per daughter wishes due to mastication difficulty reported at ALF), thin liquids with aspiration  precautions.   Impact on safety and function Mild aspiration risk   CHL IP TREATMENT RECOMMENDATION 03/13/2016 Treatment Recommendations Therapy as outlined in treatment plan below   Prognosis 03/13/2016 Prognosis for Safe Diet Advancement Good Barriers to Reach Goals -- Barriers/Prognosis Comment -- CHL IP DIET RECOMMENDATION 03/13/2016 SLP Diet Recommendations Dysphagia 3 (Mech soft) solids;Thin liquid Liquid Administration via Cup;No straw Medication Administration Whole meds with liquid Compensations Slow rate;Small sips/bites Postural Changes Seated upright at 90 degrees   CHL IP OTHER RECOMMENDATIONS 03/13/2016 Recommended Consults -- Oral Care Recommendations Oral care BID Other Recommendations --   CHL IP FOLLOW UP RECOMMENDATIONS 03/13/2016 Follow up Recommendations None   CHL IP FREQUENCY AND DURATION 03/13/2016 Speech Therapy Frequency (ACUTE ONLY) min 1 x/week Treatment Duration 1 week      CHL IP ORAL PHASE 03/13/2016 Oral Phase WFL Oral - Pudding Teaspoon -- Oral - Pudding Cup -- Oral - Honey Teaspoon -- Oral - Honey Cup -- Oral - Nectar Teaspoon -- Oral - Nectar Cup -- Oral - Nectar Straw -- Oral - Thin Teaspoon -- Oral - Thin Cup -- Oral - Thin Straw -- Oral - Puree -- Oral - Mech Soft -- Oral - Regular -- Oral - Multi-Consistency -- Oral - Pill -- Oral Phase - Comment --  CHL IP PHARYNGEAL PHASE 03/13/2016 Pharyngeal Phase Impaired Pharyngeal- Pudding Teaspoon -- Pharyngeal -- Pharyngeal- Pudding Cup -- Pharyngeal -- Pharyngeal- Honey Teaspoon -- Pharyngeal -- Pharyngeal- Honey Cup -- Pharyngeal -- Pharyngeal- Nectar Teaspoon -- Pharyngeal -- Pharyngeal- Nectar Cup -- Pharyngeal -- Pharyngeal- Nectar Straw -- Pharyngeal -- Pharyngeal- Thin Teaspoon -- Pharyngeal -- Pharyngeal- Thin Cup Delayed swallow initiation-pyriform sinuses;Delayed swallow initiation-vallecula;Penetration/Aspiration before swallow Pharyngeal Material enters airway, remains ABOVE vocal cords then ejected out Pharyngeal- Thin Straw  Delayed swallow initiation-vallecula;Delayed swallow initiation-pyriform sinuses;Penetration/Aspiration before swallow Pharyngeal Material enters airway, remains ABOVE vocal cords then ejected out Pharyngeal- Puree WFL Pharyngeal -- Pharyngeal- Mechanical Soft WFL Pharyngeal -- Pharyngeal- Regular -- Pharyngeal -- Pharyngeal- Multi-consistency -- Pharyngeal -- Pharyngeal- Pill WFL Pharyngeal -- Pharyngeal Comment --  CHL IP CERVICAL ESOPHAGEAL PHASE 03/13/2016 Cervical Esophageal Phase WFL Pudding Teaspoon -- Pudding Cup -- Honey Teaspoon -- Honey Cup -- Nectar Teaspoon -- Nectar Cup -- Nectar Straw -- Thin Teaspoon -- Thin Cup -- Thin Straw -- Puree -- Mechanical Soft -- Regular -- Multi-consistency -- Pill -- Cervical Esophageal Comment -- No flowsheet data found. Gabriel Rainwater MA, CCC-SLP [id number] [place] Leah Meryl 03/13/2016, 1:56 PM               Lab Data:  CBC:  Recent Labs Lab 03/14/16 0030 03/15/16 0331 03/17/16 0210 03/18/16 0225 03/19/16 0458  WBC 7.7 8.2 7.3 6.5 5.5  NEUTROABS 5.7  --   --   --   --   HGB 9.7* 10.1* 10.3* 9.9* 9.5*  HCT 29.9* 29.9* 30.5* 29.7* 29.3*  MCV 89.3 87.4 86.9 87.9 88.5  PLT 185 208 231 226 [id number]   Basic Metabolic Panel:  Recent Labs Lab 03/14/16 0030 03/15/16 0331 03/15/16 1505 03/15/16 2229 03/17/16 0210 03/18/16 0225  03/19/16 0458  NA 132* 131*  --   --  129* 130* 131*  K 3.4* 3.1* 3.3* 3.1* 3.3* 3.5 3.8  CL 107 103  --   --  93* 93* 95*  CO2 21* 23  --   --  28 30 31   GLUCOSE 129* 85  --   --  105* 119* 98  BUN 17 8  --   --  9 14 18   CREATININE 0.91 0.75  --   --  0.86 0.88 0.88  CALCIUM 7.9* 7.8*  --   --  7.9* 8.1* 8.3*  MG 1.8  --  1.6* 2.0  --   --   --    GFR: Estimated Creatinine Clearance: 63.1 mL/min (by C-G formula based on SCr of 0.88 mg/dL). Liver Function Tests:  Recent Labs Lab 03/14/16 0030 03/15/16 0331 03/17/16 0210  AST 83* 87* 51*  ALT 58 84* 59  ALKPHOS 47 58 53  BILITOT 0.7 0.8 0.7  PROT 4.8* 4.9* 5.2*    ALBUMIN 2.5* 2.7* 2.5*   No results for input(s): LIPASE, AMYLASE in the last 168 hours. No results for input(s): AMMONIA in the last 168 hours. Coagulation Profile: No results for input(s): INR, PROTIME in the last 168 hours. Cardiac Enzymes: No results for input(s): CKTOTAL, CKMB, CKMBINDEX, TROPONINI in the last 168 hours. BNP (last 3 results) No results for input(s): PROBNP in the last 8760 hours. HbA1C: No results for input(s): HGBA1C in the last 72 hours. CBG:  Recent Labs Lab 03/17/16 0815  GLUCAP 115*   Lipid Profile: No results for input(s): CHOL, HDL, LDLCALC, TRIG, CHOLHDL, LDLDIRECT in the last 72 hours. Thyroid Function Tests: No results for input(s): TSH, T4TOTAL, FREET4, T3FREE, THYROIDAB in the last 72 hours. Anemia Panel: No results for input(s): VITAMINB12, FOLATE, FERRITIN, TIBC, IRON, RETICCTPCT in the last 72 hours. Urine analysis:    Component Value Date/Time   COLORURINE YELLOW 03/12/2016 1414   APPEARANCEUR CLEAR 03/12/2016 1414   LABSPEC 1.018 03/12/2016 1414   PHURINE 6.5 03/12/2016 1414   GLUCOSEU NEGATIVE 03/12/2016 1414   HGBUR NEGATIVE 03/12/2016 1414   BILIRUBINUR NEGATIVE 03/12/2016 1414   KETONESUR NEGATIVE 03/12/2016 1414   PROTEINUR NEGATIVE 03/12/2016 1414   UROBILINOGEN 1.0 11/12/2014 1132   NITRITE NEGATIVE 03/12/2016 1414   LEUKOCYTESUR NEGATIVE 03/12/2016 1414     Jalon Blackwelder M.D. [facility] 03/20/2016, 10:56 AM  Pager: [phone] Between 7am to 7pm - call Pager - [phone]  After 7pm go to [email or web address] - password [email or web address]  Call night coverage person covering after 7pm

## 2016-03-21 ENCOUNTER — Inpatient Hospital Stay (HOSPITAL_COMMUNITY): Payer: Medicare Other

## 2016-03-21 DIAGNOSIS — G9340 Encephalopathy, unspecified: Secondary | ICD-10-CM

## 2016-03-21 DIAGNOSIS — J9601 Acute respiratory failure with hypoxia: Secondary | ICD-10-CM

## 2016-03-21 DIAGNOSIS — E876 Hypokalemia: Secondary | ICD-10-CM

## 2016-03-21 DIAGNOSIS — I10 Essential (primary) hypertension: Secondary | ICD-10-CM

## 2016-03-21 LAB — BASIC METABOLIC PANEL
ANION GAP: 10 (ref 5–15)
BUN: 21 mg/dL — AB (ref 6–20)
CO2: 29 mmol/L (ref 22–32)
Calcium: 8.9 mg/dL (ref 8.9–10.3)
Chloride: 94 mmol/L — ABNORMAL LOW (ref 101–111)
Creatinine, Ser: 0.95 mg/dL (ref 0.61–1.24)
GFR calc Af Amer: >60 mL/min (ref >60)
GFR calc non Af Amer: >60 mL/min (ref >60)
GLUCOSE: 93 mg/dL (ref 65–99)
Potassium: 4 mmol/L (ref 3.5–5.1)
Sodium: 133 mmol/L — ABNORMAL LOW (ref 135–145)

## 2016-03-21 LAB — CBC
HCT: 29.2 % — ABNORMAL LOW (ref 39.0–52.0)
Hemoglobin: 9.4 g/dL — ABNORMAL LOW (ref 13.0–17.0)
MCH: 28.8 pg (ref 26.0–34.0)
MCHC: 32.2 g/dL (ref 30.0–36.0)
MCV: 89.6 fL (ref 78.0–100.0)
PLATELETS: 316 10*3/uL (ref 150–400)
RBC: 3.26 MIL/uL — ABNORMAL LOW (ref 4.22–5.81)
RDW: 14 % (ref 11.5–15.5)
WBC: 6.3 10*3/uL (ref 4.0–10.5)

## 2016-03-21 MED ORDER — FUROSEMIDE 10 MG/ML IJ SOLN
20.0000 mg | Freq: Once | INTRAMUSCULAR | Status: AC
  Administered 2016-03-21: 20 mg via INTRAVENOUS
  Filled 2016-03-21: qty 2

## 2016-03-21 NOTE — Progress Notes (Signed)
PROGRESS NOTE    Barry Dean  [id number] DOB: Jan 22, 1924 DOA: 03/12/2016 PCP: [name], MD    Brief Narrative:  82 y.o. M Hx Anxiety, Depression, Parkinson's, BPH, MV Regurg, HTN, A. Fib, and GERD who presented from his assisted living facility due to acute encephalopathy. Initial evaluation revealed acute respiratory failure with hypoxia, hypotension, acute kidney injury, elevated lactic acid, and chest x-ray was concerning for pneumonia. Patient transferred from stepdown on 7/24   Assessment & Plan:   Principal Problem:   Sepsis due to pneumonia [facility]) Active Problems:   HCAP (healthcare-associated pneumonia)   Seizure disorder ([place])   Hyponatremia   Benign essential HTN   Parkinson's disease ([place])   PAF (paroxysmal atrial fibrillation) (HCC)   Sepsis ([place])   Nausea & vomiting   Diarrhea   Acute encephalopathy   Acute kidney injury ([place])   Acute respiratory failure with hypoxia (HCC)   Sepsis, unspecified organism ([place])   Hypokalemia   Hypomagnesemia   SIRS With lactic acidosis- resolved  -clinically appears to have been SIRS related to severe dehydration in the setting of ongoing watery diarrhea rather than an acute infection/sepsis   Acute hypoxic resp failure  -suspect the patient likely had an initial episode of aspiration in the setting of recurrent vomiting prior to his admit  - f/u CXR 7/20 was profoundly worse but in the setting of aggressive volume resuscitation, antibiotics were discontinued after 3 days of treatment, pt was initially improving  - Currently O2 sats 92% on 3 L, continued to wean O2 as tolerated, chest x-ray showed persistent diffuse right lung infiltrate and left lower lobe infiltrates, small left pleural effusion however no fevers or chills or leukocytosis - Still 8 L positive on I/O's, patient received 2 doses of IV Lasix on 7/23, 7/24. We'll give a dose of IV Lasix today 1. Monitor creatinine closely. - Low-grade fevers  resolved on Augmentin, chest x-ray improved.  Acute kidney injury - resolved -Cr 1.54 on admission, related to extreme dehydration in setting of nausea vomiting diarrhea  Acute encephalopathy - Much more alert and awake today, oriented to self -CT head without acute abnormalities - [id number] and folic acid normal   Acute on Chronic Grade 2 Diastolic CHF likely due to pulmonary edema from aggressive volume resuscitation - 2-D echo showed EF of 60-65% with grade 2 diastolic dysfunction on [id number] - Still 8 L positive on I/O's, patient received 2 doses of IV Lasix on 7/23, 7/24. Some clinical improvement however still with O2 requirements. Blood pressure soft. Discontinue Norvasc for now. Will give [place] dose of IV Lasix 20 mg 1.   Watery Diarrhea - Diarrhea has improved significantly. Rectal tube in place, C. difficile negative, GI pathogen panel negative - Placed on cholestyramine for 3 doses, continue loperamide. Discontinue rectal tube.  Nausea and vomiting- resolved  -appears to have resolved for now   Hypokalemia  -Due to GI losses, repleted.  Hypertension -Blood pressure stable. Hold Norvasc.   DVT prophylaxis: Lovenox Code Status: DO NOT RESUSCITATE Family Communication: Updated patient and wife at bedside. Disposition Plan: Skilled nursing facility when medically stable.   Consultants:   PCCM: Dr Elsworth Soho 03/12/2016  Procedures:   CT Abd/Pelvis 03/12/2016  CT Head 03/12/2016  Echo 03/14/2016  Chest x-ray 03/21/2016, 03/19/2016, 03/17/2016, 03/15/2016, 03/13/2016  Antimicrobials:   Augmentin 03/20/2016  IV Cefepime 03/12/16>>>>>03/14/2016  IV Vancomycin 03/12/2016>>>>03/14/2016   Subjective: Patient states shortness of breath has improved. Patient denies any chest pain. Patient still with hypoxia on  room air.  Objective: Vitals:   03/20/16 1414 03/20/16 2211 03/21/16 0540 03/21/16 1051  BP: (!) 122/57 (!) 118/56 (!) 117/52 (!) 118/55  Pulse: 77 78 67   Resp:   17 16   Temp:  99.2 F (37.3 C) 99.3 F (37.4 C)   TempSrc:  Oral Oral   SpO2: 91% (!) 88% (!) 89%   Weight:   91.3 kg (201 lb 4.5 oz)   Height:        Intake/Output Summary (Last 24 hours) at 03/21/16 1305 Last data filed at 03/21/16 0947  Gross per 24 hour  Intake              600 ml  Output             1200 ml  Net             -600 ml   Filed Weights   03/19/16 0444 03/20/16 0542 03/21/16 0540  Weight: 92.1 kg (203 lb) 91.7 kg (202 lb 1.6 oz) 91.3 kg (201 lb 4.5 oz)    Examination:  General exam: Appears calm and comfortable  Respiratory system: Clear to auscultation Anterior lung fields. Respiratory effort normal. Cardiovascular system: S1 & S2 heard, RRR. No JVD, murmurs, rubs, gallops or clicks. Trace bilateral lower extremity edema. Gastrointestinal system: Abdomen is nondistended, soft and nontender. No organomegaly or masses felt. Normal bowel sounds heard. Central nervous system: Alert and oriented. No focal neurological deficits. Extremities: Symmetric 5 x 5 power. Skin: No rashes, lesions or ulcers Psychiatry: Judgement and insight appear normal. Mood & affect appropriate.     Data Reviewed: I have personally reviewed following labs and imaging studies  CBC:  Recent Labs Lab 03/15/16 0331 03/17/16 0210 03/18/16 0225 03/19/16 0458 03/21/16 0712  WBC 8.2 7.3 6.5 5.5 6.3  HGB 10.1* 10.3* 9.9* 9.5* 9.4*  HCT 29.9* 30.5* 29.7* 29.3* 29.2*  MCV 87.4 86.9 87.9 88.5 89.6  PLT 208 231 226 272 [id number]   Basic Metabolic Panel:  Recent Labs Lab 03/15/16 0331 03/15/16 1505 03/15/16 2229 03/17/16 0210 03/18/16 0225 03/19/16 0458 03/21/16 0712  NA 131*  --   --  129* 130* 131* 133*  K 3.1* 3.3* 3.1* 3.3* 3.5 3.8 4.0  CL 103  --   --  93* 93* 95* 94*  CO2 23  --   --  28 30 31 29   GLUCOSE 85  --   --  105* 119* 98 93  BUN 8  --   --  9 14 18  21*  CREATININE 0.75  --   --  0.86 0.88 0.88 0.95  CALCIUM 7.8*  --   --  7.9* 8.1* 8.3* 8.9  MG  --  1.6* 2.0   --   --   --   --    GFR: Estimated Creatinine Clearance: 58.4 mL/min (by C-G formula based on SCr of 0.95 mg/dL). Liver Function Tests:  Recent Labs Lab 03/15/16 0331 03/17/16 0210  AST 87* 51*  ALT 84* 59  ALKPHOS 58 53  BILITOT 0.8 0.7  PROT 4.9* 5.2*  ALBUMIN 2.7* 2.5*   No results for input(s): LIPASE, AMYLASE in the last 168 hours. No results for input(s): AMMONIA in the last 168 hours. Coagulation Profile: No results for input(s): INR, PROTIME in the last 168 hours. Cardiac Enzymes: No results for input(s): CKTOTAL, CKMB, CKMBINDEX, TROPONINI in the last 168 hours. BNP (last 3 results) No results for input(s): PROBNP in the last 8760 hours. HbA1C:  No results for input(s): HGBA1C in the last 72 hours. CBG:  Recent Labs Lab 03/17/16 0815  GLUCAP 115*   Lipid Profile: No results for input(s): CHOL, HDL, LDLCALC, TRIG, CHOLHDL, LDLDIRECT in the last 72 hours. Thyroid Function Tests: No results for input(s): TSH, T4TOTAL, FREET4, T3FREE, THYROIDAB in the last 72 hours. Anemia Panel: No results for input(s): VITAMINB12, FOLATE, FERRITIN, TIBC, IRON, RETICCTPCT in the last 72 hours. Sepsis Labs: No results for input(s): PROCALCITON, LATICACIDVEN in the last 168 hours.  Recent Results (from the past 240 hour(s))  Blood culture (routine x 2)     Status: None   Collection Time: 03/12/16 12:17 PM  Result Value Ref Range Status   Specimen Description BLOOD RIGHT ANTECUBITAL  Final   Special Requests BOTTLES DRAWN AEROBIC AND ANAEROBIC  5CC  Final   Culture NO GROWTH 5 DAYS  Final   Report Status 03/17/2016 FINAL  Final  Blood culture (routine x 2)     Status: None   Collection Time: 03/12/16 12:31 PM  Result Value Ref Range Status   Specimen Description BLOOD LEFT HAND  Final   Special Requests BOTTLES DRAWN AEROBIC AND ANAEROBIC  5CC  Final   Culture NO GROWTH 5 DAYS  Final   Report Status 03/17/2016 FINAL  Final  Urine culture     Status: None   Collection  Time: 03/12/16  2:14 PM  Result Value Ref Range Status   Specimen Description URINE, RANDOM  Final   Special Requests NONE  Final   Culture NO GROWTH  Final   Report Status 03/13/2016 FINAL  Final  Gastrointestinal Panel by PCR , Stool     Status: None   Collection Time: 03/12/16  6:34 PM  Result Value Ref Range Status   Campylobacter species NOT DETECTED NOT DETECTED Final   Plesimonas shigelloides NOT DETECTED NOT DETECTED Final   Salmonella species NOT DETECTED NOT DETECTED Final   Yersinia enterocolitica NOT DETECTED NOT DETECTED Final   Vibrio species NOT DETECTED NOT DETECTED Final   Vibrio cholerae NOT DETECTED NOT DETECTED Final   Enteroaggregative E coli (EAEC) NOT DETECTED NOT DETECTED Final   Enteropathogenic E coli (EPEC) NOT DETECTED NOT DETECTED Final   Enterotoxigenic E coli (ETEC) NOT DETECTED NOT DETECTED Final   Shiga like toxin producing E coli (STEC) NOT DETECTED NOT DETECTED Final   E. coli O157 NOT DETECTED NOT DETECTED Final   Shigella/Enteroinvasive E coli (EIEC) NOT DETECTED NOT DETECTED Final   Cryptosporidium NOT DETECTED NOT DETECTED Final   Cyclospora cayetanensis NOT DETECTED NOT DETECTED Final   Entamoeba histolytica NOT DETECTED NOT DETECTED Final   Giardia lamblia NOT DETECTED NOT DETECTED Final   Adenovirus F40/41 NOT DETECTED NOT DETECTED Final   Astrovirus NOT DETECTED NOT DETECTED Final   Norovirus GI/GII NOT DETECTED NOT DETECTED Final   Rotavirus A NOT DETECTED NOT DETECTED Final   Sapovirus (I, II, IV, and V) NOT DETECTED NOT DETECTED Final  MRSA PCR Screening     Status: Abnormal   Collection Time: 03/12/16  7:16 PM  Result Value Ref Range Status   MRSA by PCR POSITIVE (A) NEGATIVE Final    Comment:        The GeneXpert MRSA Assay (FDA approved for NASAL specimens only), is one component of a comprehensive MRSA colonization surveillance program. It is not intended to diagnose MRSA infection nor to guide or monitor treatment for MRSA  infections. RESULT CALLED TO, READ BACK BY AND VERIFIED WITH: M  [place] [id number] 03/12/16 A BROWNING   C difficile quick scan w PCR reflex     Status: None   Collection Time: 03/13/16 11:14 AM  Result Value Ref Range Status   C Diff antigen NEGATIVE NEGATIVE Final   C Diff toxin NEGATIVE NEGATIVE Final   C Diff interpretation No C. difficile detected.  Final         Radiology Studies: Dg Chest 2 View  Result Date: 03/21/2016 CLINICAL DATA:  Pneumonia. EXAM: CHEST  2 VIEW COMPARISON:  03/19/2016. FINDINGS: Patient is rotated to the left making evaluation difficult. Right paratracheal soft tissue fullness again cannot be excluded. An upright PA and lateral chest x-ray is suggested for further evaluation. Diffuse right lung and left lower lobe infiltrates are present, improved from prior exam. Cardiomegaly with normal pulmonary vascularity. Prominence sliding hiatal hernia. IMPRESSION: 1. Patient is rotated to the left making evaluation difficult. Previously identified fullness in the right paratracheal region cannot be excluded. A PA and lateral chest x-ray is suggested for further evaluation. 2. Diffuse right lung and left lower lobe infiltrates, improved from prior exam. 3.  Prominent hiatal hernia. Electronically Signed   By: Marcello Moores  Register   On: 03/21/2016 07:35       Scheduled Meds: . amLODipine  10 mg Oral Daily  . amoxicillin-clavulanate  1 tablet Oral Q12H  . aspirin  81 mg Oral QPM  . cholestyramine light  4 g Oral BID  . enoxaparin (LOVENOX) injection  40 mg Subcutaneous Q24H  . feeding supplement (ENSURE ENLIVE)  237 mL Oral Q24H  . lacosamide  150 mg Oral QHS  . lacosamide  50 mg Oral Daily  . lamoTRIgine  75 mg Oral BID  . loperamide  4 mg Oral Q8H  . sodium chloride flush  3 mL Intravenous Q12H   Continuous Infusions:    LOS: 9 days    Time spent: 35 minutes    Carry Weesner, MD [facility] Pager [phone]  If 7PM-7AM, please contact  night-coverage [email or web address] Password [email or web address] 03/21/2016, 1:05 PM

## 2016-03-21 NOTE — Progress Notes (Signed)
CSW confirmed that [facility] is able to take patient with current level of functioning- spoke with dtr who continues to want patient to return to [facility] can also accept patient if needing oxygen- would need it delivered prior to patient arrival- they use [place] for home health needs  CSW will continue to follow  Domenica Reamer, [place] Worker [phone]

## 2016-03-21 NOTE — Progress Notes (Signed)
Physical Therapy Treatment Patient Details Name: Barry Dean MRN: [id number] DOB: 06/11/1924 Today's Date: 03/21/2016    History of Present Illness Barry Dean is a 82 y.o. male with a Past Medical History of Parkinson's, HTN, SZR, deafness, reflux, A. fib, BPH, GERD, who presents with sepsis likely secondary to PNA (LLL HCAP vs aspiration). Patient presents from nursing home orders likely also picked up a viral gastroenteritis causing severe dehydration.     PT Comments    Patient seen for therapy progression. Attempted in room ambulation and mobility (limited by incontinence) and performed functional tasks at sink. Patient overall with significant weakness and poor ability to mobilize without increased 2 person assist. Continue to recommend SNF upon acute discharge, if patient and family decline, will need HHPT.  Follow Up Recommendations  SNF;Supervision/Assistance - 24 hour     Equipment Recommendations  None recommended by PT    Recommendations for Other Services       Precautions / Restrictions Precautions Precautions: Fall;Other (comment) Precaution Comments: h/o seizures Restrictions Weight Bearing Restrictions: No    Mobility  Bed Mobility Overal bed mobility: Needs Assistance Bed Mobility: Rolling;Sidelying to Sit;Sit to Supine Rolling: Max assist Sidelying to sit: Max assist;+2 for physical assistance (for LEs and to raise trunk)   Sit to supine: Mod assist;+2 for physical assistance (for LEs back to bed and controlled descent of trunk)   General bed mobility comments: Max multimodal cueing to roll and reach for bed rails.  Transfers Overall transfer level: Needs assistance Equipment used: Rolling walker (2 wheeled) Transfers: Sit to/from Stand Sit to Stand: Mod assist;+2 safety/equipment         General transfer comment: Mod assist +2 to boost up from EOB and for balance in standing.  Ambulation/Gait Ambulation/Gait assistance: Mod assist;+2  safety/equipment Ambulation Distance (Feet): 12 Feet Assistive device: Rolling walker (2 wheeled) Gait Pattern/deviations: Step-to pattern;Decreased stride length;Shuffle;Festinating;Trunk flexed;Wide base of support     General Gait Details: Physical assist for stability, cues for positioning, patient with poor receptivity to cues due to [facility] and decreased awareness. Assist for [place] of RW   Stairs            Wheelchair Mobility    Modified Rankin (Stroke Patients Only)       Balance Overall balance assessment: Needs assistance Sitting-balance support: Feet supported;Bilateral upper extremity supported Sitting balance-Leahy Scale: Fair     Standing balance support: No upper extremity supported;During functional activity Standing balance-Leahy Scale: Poor Standing balance comment: requires mod assist to maintain standing balance                    Cognition Arousal/Alertness: Awake/alert Behavior During Therapy: Flat affect Overall Cognitive Status: Within Functional Limits for tasks assessed                      Exercises      General Comments General comments (skin integrity, edema, etc.): extensive time spent performing pericare and hygiene with patient this session      Pertinent Vitals/Pain Pain Assessment: Faces Faces Pain Scale: Hurts a little bit    Home Living                      Prior Function            PT Goals (current goals can now be found in the care plan section) Acute Rehab PT Goals Patient Stated Goal: none stated PT Goal Formulation: With patient/family  Time For Goal Achievement: 03/27/16 Potential to Achieve Goals: Fair Progress towards PT goals: Progressing toward goals    Frequency  Min 2X/week    PT Plan Current plan remains appropriate    Co-evaluation             End of Session Equipment Utilized During Treatment: Gait belt;Oxygen Activity Tolerance: Patient limited by fatigue Patient left:  with call bell/phone within reach;in bed;with bed alarm set     Time: [id number] PT Time Calculation (min) (ACUTE ONLY): 27 min  Charges:  $Therapeutic Activity: 8-22 mins                    G CodesDuncan Dull 04-04-16, 5:26 PM Alben Deeds, [place] DPT  [phone]

## 2016-03-21 NOTE — Progress Notes (Signed)
Tried to ween patient off oxygen.  Patient Saturations on Room Air at Rest = 76%  Patient placed back on 2L nasal cannula.

## 2016-03-21 NOTE — Progress Notes (Signed)
Occupational Therapy Treatment Patient Details Name: Barry Dean MRN: [id number] DOB: May 17, 1924 Today's Date: 03/21/2016    History of present illness Barry Dean is a 82 y.o. male with a Past Medical History of Parkinson's, HTN, SZR, deafness, reflux, A. fib, BPH, GERD, who presents with sepsis likely secondary to PNA (LLL HCAP vs aspiration). Patient presents from nursing home orders likely also picked up a viral gastroenteritis causing severe dehydration.    OT comments  Pt making gradual progress toward OT goals this session. Required max assist for peri care and mod assist +2 for functional mobility. Pt tolerated standing at sink with mod assist to wash hands. D/c plan remains appropriate. Will continue to follow acutely.   Follow Up Recommendations  SNF    Equipment Recommendations  None recommended by OT    Recommendations for Other Services      Precautions / Restrictions Precautions Precautions: Fall;Other (comment) Precaution Comments: h/o seizures Restrictions Weight Bearing Restrictions: No       Mobility Bed Mobility Overal bed mobility: Needs Assistance Bed Mobility: Rolling;Sidelying to Sit;Sit to Supine Rolling: Max assist Sidelying to sit: Max assist;+2 for physical assistance (for LEs and to raise trunk)   Sit to supine: Mod assist;+2 for physical assistance (for LEs back to bed and controlled descent of trunk)   General bed mobility comments: Max multimodal cueing to roll and reach for bed rails.  Transfers Overall transfer level: Needs assistance Equipment used: Rolling walker (2 wheeled) Transfers: Sit to/from Stand Sit to Stand: Mod assist;+2 safety/equipment         General transfer comment: Mod assist +2 to boost up from EOB and for balance in standing.    Balance Overall balance assessment: Needs assistance Sitting-balance support: Feet supported;Bilateral upper extremity supported Sitting balance-Leahy Scale: Fair      Standing balance support: No upper extremity supported;During functional activity Standing balance-Leahy Scale: Poor Standing balance comment: requires mod assist to maintain standing balance                   ADL Overall ADL's : Needs assistance/impaired     Grooming: Moderate assistance;Wash/dry hands Grooming Details (indicate cue type and reason): +2 for safety with static standing. Mod assist for balance.             Lower Body Dressing: Total assistance Lower Body Dressing Details (indicate cue type and reason): to don sock Toilet Transfer: Moderate assistance;+2 for physical assistance;Ambulation;RW;Cueing for safety;Cueing for sequencing Toilet Transfer Details (indicate cue type and reason): Simulated by sit to stand from EOB and short distance functional mobility in room Toileting- Clothing Manipulation and Hygiene: Maximal assistance;Sit to/from stand;Bed level       Functional mobility during ADLs: Moderate assistance;+2 for physical assistance;Cueing for safety;Cueing for sequencing;Rolling walker General ADL Comments: Pt HOH and requires max verbal and tactile cues to initiate movement and activities.      Vision                     Perception     Praxis      Cognition   Behavior During Therapy: Flat affect Overall Cognitive Status: Within Functional Limits for tasks assessed                       Extremity/Trunk Assessment               Exercises     Shoulder Instructions       General  Comments      Pertinent Vitals/ Pain       Pain Assessment: Faces Faces Pain Scale: Hurts a little bit  Home Living                                          Prior Functioning/Environment              Frequency Min 2X/week     Progress Toward Goals  OT Goals(current goals can now be found in the care plan section)  Progress towards OT goals: Progressing toward goals  Acute Rehab OT Goals Patient  Stated Goal: none stated OT Goal Formulation: With patient  Plan Discharge plan remains appropriate    Co-evaluation                 End of Session Equipment Utilized During Treatment: Gait belt;Rolling walker;Oxygen   Activity Tolerance Patient tolerated treatment well   Patient Left in bed;with call bell/phone within reach;with bed alarm set   Nurse Communication          Time: [id number] OT Time Calculation (min): 27 min  Charges: OT General Charges $OT Visit: 1 Procedure OT Treatments $Self Care/Home Management : 8-22 mins  Binnie Kand M.S., OTR/L Pager: [phone]  03/21/2016, 5:14 PM

## 2016-03-22 DIAGNOSIS — I5033 Acute on chronic diastolic (congestive) heart failure: Secondary | ICD-10-CM | POA: Diagnosis present

## 2016-03-22 DIAGNOSIS — N179 Acute kidney failure, unspecified: Secondary | ICD-10-CM

## 2016-03-22 LAB — BASIC METABOLIC PANEL
ANION GAP: 8 (ref 5–15)
BUN: 19 mg/dL (ref 6–20)
CALCIUM: 9 mg/dL (ref 8.9–10.3)
CHLORIDE: 93 mmol/L — AB (ref 101–111)
CO2: 30 mmol/L (ref 22–32)
CREATININE: 0.93 mg/dL (ref 0.61–1.24)
Glucose, Bld: 94 mg/dL (ref 65–99)
Potassium: 4.4 mmol/L (ref 3.5–5.1)
Sodium: 131 mmol/L — ABNORMAL LOW (ref 135–145)

## 2016-03-22 LAB — CBC
HCT: 31.2 % — ABNORMAL LOW (ref 39.0–52.0)
HEMOGLOBIN: 10.3 g/dL — AB (ref 13.0–17.0)
MCH: 29 pg (ref 26.0–34.0)
MCHC: 33 g/dL (ref 30.0–36.0)
MCV: 87.9 fL (ref 78.0–100.0)
Platelets: 400 10*3/uL (ref 150–400)
RBC: 3.55 MIL/uL — AB (ref 4.22–5.81)
RDW: 13.8 % (ref 11.5–15.5)
WBC: 8.4 10*3/uL (ref 4.0–10.5)

## 2016-03-22 LAB — MAGNESIUM: Magnesium: 2.1 mg/dL (ref 1.7–2.4)

## 2016-03-22 MED ORDER — LAMOTRIGINE 25 MG PO TABS: 75.0000 mg | ORAL_TABLET | Freq: Two times a day (BID) | ORAL | Status: DC

## 2016-03-22 MED ORDER — ENOXAPARIN SODIUM 40 MG/0.4ML ~~LOC~~ SOLN: 40.0000 mg | SUBCUTANEOUS | Status: DC

## 2016-03-22 MED ORDER — FUROSEMIDE 10 MG/ML IJ SOLN
20.0000 mg | Freq: Once | INTRAMUSCULAR | Status: AC
  Administered 2016-03-22: 20 mg via INTRAVENOUS
  Filled 2016-03-22: qty 2

## 2016-03-22 MED ORDER — DM-GUAIFENESIN ER 30-600 MG PO TB12: 1.0000 | ORAL_TABLET | Freq: Two times a day (BID) | ORAL | 0 refills | Status: DC | PRN

## 2016-03-22 MED ORDER — HYDROCODONE-ACETAMINOPHEN 5-325 MG PO TABS: 1.0000 | ORAL_TABLET | Freq: Three times a day (TID) | ORAL | 0 refills | Status: DC | PRN

## 2016-03-22 MED ORDER — AMOXICILLIN-POT CLAVULANATE 875-125 MG PO TABS
1.0000 | ORAL_TABLET | Freq: Two times a day (BID) | ORAL | 0 refills | Status: AC
Start: 2016-03-22 — End: 2016-03-27

## 2016-03-22 MED ORDER — PANTOPRAZOLE SODIUM 40 MG PO TBEC
40.0000 mg | DELAYED_RELEASE_TABLET | Freq: Every day | ORAL | Status: DC
Start: 2016-03-22 — End: 2016-06-12

## 2016-03-22 MED ORDER — AMLODIPINE BESY-BENAZEPRIL HCL 5-20 MG PO CAPS
1.0000 | ORAL_CAPSULE | Freq: Every day | ORAL | Status: DC
Start: 2016-03-26 — End: 2016-12-23

## 2016-03-22 MED ORDER — ENSURE ENLIVE PO LIQD: 237.0000 mL | ORAL | 0 refills | Status: AC

## 2016-03-22 MED ORDER — IPRATROPIUM-ALBUTEROL 0.5-2.5 (3) MG/3ML IN SOLN: 3.0000 mL | Freq: Four times a day (QID) | RESPIRATORY_TRACT | 0 refills | Status: AC | PRN

## 2016-03-22 NOTE — Progress Notes (Signed)
Oxygen unable to be delivered to ALF until 6:00pm. Therefore patient could not arrive until after 6:00pm if DC'd today. Admission RN leaves for the day at 5:00pm. Patient will be DC'd tomorrow. Dr Grandville Silos made aware by CSW.

## 2016-03-22 NOTE — Discharge Summary (Addendum)
Physician Discharge Summary  Barry Dean [id number] DOB: 1924-01-06 DOA: 03/12/2016  PCP: [name], MD  Admit date: 03/12/2016 Discharge date: 03/23/2016  Time spent: 65 minutes  Recommendations for Outpatient Follow-up:  1. Patient be discharged back to [facility] assisted living facility with home health therapies. Follow-up with [name], MD in 1-2 weeks. On follow-up patient's oxygen requirements will need to be reassessed at that time. Patient also needs a basic metabolic profile and Magnesium done to follow-up on electrolytes and renal function. Patient is being discharged on home O2. Patient's blood pressure also needs to be reassessed at that time.   Discharge Diagnoses:  Principal Problem:   Acute respiratory failure with hypoxia (HCC) Active Problems:   Acute on chronic diastolic heart failure (HCC)   HCAP (healthcare-associated pneumonia)   Seizure disorder (HCC)   Hyponatremia   Benign essential HTN   Parkinson's disease (HCC)   PAF (paroxysmal atrial fibrillation) (HCC)   Sepsis (HCC)   Nausea & vomiting   Diarrhea   Acute encephalopathy   Acute kidney injury ([place])   Sepsis due to pneumonia (HCC)   Sepsis, unspecified organism ([place])   Hypokalemia   Hypomagnesemia   Discharge Condition: Stable and improved  Diet recommendation: Dysphagia 3 diet  Filed Weights   03/21/16 0540 03/22/16 0505 03/23/16 0533  Weight: 91.3 kg (201 lb 4.5 oz) 92.2 kg (203 lb 4.8 oz) 89.6 kg (197 lb 8 oz)    History of present illness:  Per Dr Cornelius Moras is a very HOH 82 y.o. male with medical history significant for Parkinson's, BPH, mitral regurg, hypertension, A. fib, anxiety, depression, GERD presents to the emergency department from assisted living facility with the chief complaint acute encephalopathy. Initial evaluation reveals acute respiratory failure with hypoxia,  hypotension, acute kidney injury, elevated lactic acid, chest  x-ray concerning for pneumonia.  Information is obtained from the chart and the staff this patient is too hard of hearing and ill to participate. Patient was found sitting in a wheelchair slumped over noted to be unresponsive to verbal stimuli. He was described as pale and diaphoretic. Reportedly he has had several episodes of vomiting and diarrhea at the facility. No reports of any complaints of chest pain shortness of breath cough fever chills. Patient very hard of hearing but denies pain at this time.    ED Course: In the emergency department code sepsis called he is provided with vigorous IV fluids and broad-spectrum antibiotics. He continues to have large amounts of liquid stool. Mentation improved after fluids blood pressure remains soft. He is evaluated by critical care   Hospital Course:  SIRS With lactic acidosis- resolved  -clinically appears to have been SIRS related to severe dehydration in the setting of ongoing watery diarrhea rather than an acute infection/sepsis.  Acute hypoxic resp failure  -suspect the patient likely had an initial episode of aspiration in the setting of recurrent vomiting prior to his admit  - f/u CXR 7/20 was profoundly worse but in the setting of aggressive volume resuscitation, antibiotics were discontinued after 3 days of treatment, pt was initially improving  - Currently O2 sats 92% on 3 L, continued to wean O2 as tolerated, chest x-ray showed persistent diffuse right lung infiltrate and left lower lobe infiltrates, small left pleural effusion however no fevers or chills or leukocytosis - Patient noted to be approximately 9 L positive June the hospitalization and diuresed with several doses of IV Lasix with clinical improvement. Due to low-grade fevers  patient was also started on Augmentin. Patient be discharged on 5 more days of oral Augmentin to complete a course of antibiotic treatment. Repeat chest x-ray which was done showed significant  improvement. Patient was still requiring 2-3 L oxygen and a such will be discharged on home O2. Outpatient follow-up.   Acute kidney injury - resolved -Cr 1.54 on admission, related to extreme dehydration in setting of nausea vomiting diarrhea. Patient was hydrated with IV fluids with resolution of acute kidney injury. On day of discharge creatinine was 0.93.  Acute encephalopathy - Much more alert and awake today, oriented to self -CT head without acute abnormalities - [id number] and folic acid normal  Patient's acute encephalopathy was likely due to acute CHF exacerbation as well as probable pneumonia. Patient was close to baseline by day of discharge.  Acute on Chronic Grade 2 Diastolic CHFlikely due to pulmonary edema from aggressive volume resuscitation - 2-D echo showed EF of 60-65% with grade 2 diastolic dysfunction on [id number] - Patient noted to be +9 L during the hospitalization which was felt to be secondary to aggressive hydration on admission due to concerns for systemic inflammatory response syndrome. Patient was given a few doses of IV Lasix with good urine output and clinical improvement. Patient's antihypertensive medications were held as patient's blood pressure was borderline. Patient improved clinically. Patient still with O2 requirements on be discharged on home O2. Outpatient follow-up with PCP.   Watery Diarrhea - Diarrhea has improved significantly. Rectal tube in place, C. difficile negative, GI pathogen panel negative - Placed on cholestyramine for 3 doses and loperamide with resolution of watery diarrhea. Rectal tube was subsequently discontinued.  Nausea and vomiting- resolved   Hypokalemia  -Due to GI losses, repleted.  Hypertension -Blood pressure stable. Patient's antihypertensive medications were held may be resumed in 3-4 days.  The rest of patient's chronic medical issues remained stable throughout the hospitalization.   Procedures:  CT Abd/Pelvis  03/12/2016  CT Head 03/12/2016  Echo 03/14/2016  Chest x-ray 03/21/2016, 03/19/2016, 03/17/2016, 03/15/2016, 03/13/2016   Consultations:  PCCM: Dr Elsworth Soho 03/12/2016  Discharge Exam: Vitals:   03/22/16 2205 03/23/16 0533  BP: (!) 137/51 111/64  Pulse: 76 72  Resp: 20 20  Temp: 97.9 F (36.6 C) 97.5 F (36.4 C)    General: NAD Cardiovascular: RRR Respiratory: CTAB  Discharge Instructions   Discharge Instructions    Diet general    Complete by:  As directed   Dysphagia 3 diet   Discharge instructions    Complete by:  As directed   Increase activity slowly    Complete by:  As directed     Current Discharge Medication List    START taking these medications   Details  amoxicillin-clavulanate (AUGMENTIN) 875-125 MG tablet Take 1 tablet by mouth every 12 (twelve) hours. Qty: 10 tablet, Refills: 0    dextromethorphan-guaiFENesin (MUCINEX DM) 30-600 MG 12hr tablet Take 1 tablet by mouth 2 (two) times daily as needed for cough. Qty: 10 tablet, Refills: 0    feeding supplement, ENSURE ENLIVE, (ENSURE ENLIVE) LIQD Take 237 mLs by mouth daily. Qty: 237 mL, Refills: 0    ipratropium-albuterol (DUONEB) 0.5-2.5 (3) MG/3ML SOLN Take 3 mLs by nebulization every 6 (six) hours as needed. Qty: 360 mL, Refills: 0      CONTINUE these medications which have CHANGED   Details  amLODipine-benazepril (LOTREL) 5-20 MG capsule Take 1 capsule by mouth daily. Resume in 4 days.    HYDROcodone-acetaminophen (NORCO/VICODIN) 5-325 MG tablet Take  1 tablet by mouth every 8 (eight) hours as needed for moderate pain. Qty: 10 tablet, Refills: 0    lamoTRIgine (LAMICTAL) 25 MG tablet Take 3 tablets (75 mg total) by mouth 2 (two) times daily.    pantoprazole (PROTONIX) 40 MG tablet Take 1 tablet (40 mg total) by mouth daily.      CONTINUE these medications which have NOT CHANGED   Details  aspirin 81 MG chewable tablet Chew 1 tablet (81 mg total) by mouth every evening.    lacosamide (VIMPAT)  50 MG TABS tablet Take 1 tablet in the AM (50 mg) and 3 tablets (150 mg ) in the PM. Qty: 120 tablet, Refills: 4    mineral oil external liquid Place 3 drops into both ears every 7 (seven) days.    vitamin B-12 (CYANOCOBALAMIN) 1000 MCG tablet Take 2,000 mcg by mouth daily.    acetaminophen (TYLENOL) 500 MG tablet Take 500 mg by mouth every 6 (six) hours as needed for pain. Not to exceed 3 gms of acetaminophen in 24 hours    sennosides-docusate sodium (SENOKOT-S) 8.6-50 MG tablet Take 1 tablet by mouth at bedtime as needed for constipation.       Allergies  Allergen Reactions  . Topamax Other (See Comments)    Unknown reaction  . Uroxatral [Alfuzosin Hydrochloride] Other (See Comments)    Unknown reaction  . Valium Other (See Comments)    Unknown reaction   Follow-up Information    [name] .   Why:  For home health Contact information: [address] [place] [address] [phone]        Inc[facility] - [facility] .   Why:  For oxygen. It will be delivered to facility by 6:00pm today Contact information: [place] [address] [phone]        STONEKING,HAL THOMAS, MD. Schedule an appointment as soon as possible for a visit in 1 week(s).   Specialty:  Internal Medicine Why:  f/u in 1-2 weeks Contact information: [address] [place]  [address] [phone]            The results of significant diagnostics from this hospitalization (including imaging, microbiology, ancillary and laboratory) are listed below for reference.    Significant Diagnostic Studies: Ct Abdomen Pelvis Wo Contrast  Result Date: 03/12/2016 CLINICAL DATA:  Patient found unresponsive and diaphoretic today. Recurrent diarrhea and hypotension today. Initial encounter. EXAM: CT ABDOMEN AND PELVIS WITHOUT CONTRAST TECHNIQUE: Multidetector CT imaging of the abdomen and pelvis was performed following the standard protocol  without IV contrast. COMPARISON:  CT abdomen and pelvis 09/13/2014. FINDINGS: Large hiatal hernia is seen. Left worse than right basilar atelectasis. Micronodularity in the lung bases is chronic and compatible with some prior infectious or inflammatory process. There appears to be sludge versus gravel type stones dependently within an otherwise unremarkable gallbladder. The liver, spleen, adrenal glands, pancreas and kidneys appear normal. Aortoiliac atherosclerosis is identified. No fluid collection or lymphadenopathy is seen. There is no evidence of bowel obstruction. Liquid type stool is seen in the colon. Rectal tube is in place. No lymphadenopathy or fluid is seen. No focal bony abnormality is identified. Lumbar scoliosis and scattered spondylosis noted. IMPRESSION: Liquid type stool throughout the colon compatible with diarrhea. No other acute abnormality is seen. Atherosclerosis. Large hiatal hernia with an intrathoracic stomach. Associated left basilar atelectasis is noted. Possible gallbladder sludge or tiny stones without evidence of cholecystitis. Electronically Signed  By: Inge Rise M.D.   On: 03/12/2016 15:41   Dg Chest 2 View  Result Date: 03/21/2016 CLINICAL DATA:  Pneumonia. EXAM: CHEST  2 VIEW COMPARISON:  03/19/2016. FINDINGS: Patient is rotated to the left making evaluation difficult. Right paratracheal soft tissue fullness again cannot be excluded. An upright PA and lateral chest x-ray is suggested for further evaluation. Diffuse right lung and left lower lobe infiltrates are present, improved from prior exam. Cardiomegaly with normal pulmonary vascularity. Prominence sliding hiatal hernia. IMPRESSION: 1. Patient is rotated to the left making evaluation difficult. Previously identified fullness in the right paratracheal region cannot be excluded. A PA and lateral chest x-ray is suggested for further evaluation. 2. Diffuse right lung and left lower lobe infiltrates, improved from prior  exam. 3.  Prominent hiatal hernia. Electronically Signed   By: Marcello Moores  Register   On: 03/21/2016 07:35  Ct Head Wo Contrast  Result Date: 03/12/2016 CLINICAL DATA:  Mental status changes today. EXAM: CT HEAD WITHOUT CONTRAST TECHNIQUE: Contiguous axial images were obtained from the base of the skull through the vertex without intravenous contrast. COMPARISON:  Head CT 03/05/2015 FINDINGS: Stable age related cerebral atrophy, ventriculomegaly and periventricular white matter disease. No extra-axial fluid collections are identified. No CT findings for acute hemispheric infarction or intracranial hemorrhage. No mass lesions. The brainstem and cerebellum are normal. The bony structures are intact. No acute fracture or bone lesion. The paranasal sinuses and mastoid air cells are grossly clear. The globes are intact. IMPRESSION: Stable age related cerebral atrophy, ventriculomegaly and periventricular white matter disease. No acute intracranial findings or mass lesion. Electronically Signed   By: Marijo Sanes M.D.   On: 03/12/2016 15:31   Dg Chest Port 1 View  Result Date: 03/19/2016 CLINICAL DATA:  Pneumonia.  Pulmonary edema. EXAM: PORTABLE CHEST 1 VIEW COMPARISON:  03/17/2016 . FINDINGS: Patient is rotated to the left. Right paratracheal soft tissue prominence is noted. Although this may be related to patient rotation prominent great vessels are right paratracheal/ mediastinal mass cannot be excluded. Upright PA and lateral chest x-ray suggested for further evaluation. Diffuse right lung and left lower lobe infiltrates are present. Small left pleural effusion present. Similar findings noted on prior exam. Stable cardiomegaly. Stable right apical pleural thickening. A prominent hiatal hernia is been previously noted. No pneumothorax. IMPRESSION: 1. Soft tissue prominence is in the right peritracheal/ upper mediastinal region. Findings may be related to patient rotation and AP technique/great vessels. A PA and  lateral chest x-ray is suggested to further evaluate inter to exclude a right paratracheal/mediastinal mass. 2. Persistent diffuse right lung infiltrate and left lower lobe infiltrate. Small left pleural effusion. 3. Stable cardiomegaly. Electronically Signed   By: Marcello Moores  Register   On: 03/19/2016 07:25  Dg Chest Port 1 View  Result Date: 03/17/2016 CLINICAL DATA:  Pneumonia, hypertension, atrial fibrillation. EXAM: PORTABLE CHEST 1 VIEW COMPARISON:  03/15/2016. FINDINGS: Patient is rotated. Heart size is grossly stable. There is patchy airspace opacification throughout the right hemi thorax. Opacification projecting over the heart may be due to a large hiatal hernia, as seen previously. No definite pleural fluid. IMPRESSION: 1. Patchy opacification throughout the right hemi thorax is worrisome for pneumonia. 2. Large hiatal hernia. Electronically Signed   By: Lorin Picket M.D.   On: 03/17/2016 11:15   Dg Chest Port 1 View  Result Date: 03/15/2016 CLINICAL DATA:  Hypertension, atrial fibrillation, pneumonia EXAM: PORTABLE CHEST 1 VIEW COMPARISON:  03/12/2016 chest x-ray and CT abdomen pelvis FINDINGS:  Very large hiatal hernia noted with an air-fluid level. Interval development of severe asymmetric bilateral airspace disease versus edema, involving the entire right lung and the lingula and left lower lobe. Given the acute rapid interval change compared to 3 days ago, acute edema is favored over severe pneumonia. No large effusion or pneumothorax. Aorta is atherosclerotic. IMPRESSION: Acute development of severe asymmetric bilateral airspace disease, compatible with acute alveolar edema versus severe bilateral pneumonia. Very large hiatal hernia Aortic atherosclerosis Electronically Signed   By: Jerilynn Mages.  Shick M.D.   On: 03/15/2016 18:48   Dg Chest Portable 1 View  Result Date: 03/12/2016 CLINICAL DATA:  Altered mental status with nausea and vomiting EXAM: PORTABLE CHEST 1 VIEW COMPARISON:  April 30, 2014 and March 05, 2015 FINDINGS: There is bibasilar interstitial fibrosis. There is focal airspace consolidation in the left lower lobe. Heart is mildly enlarged with pulmonary vascularity within normal limits. There is atherosclerotic calcification in the aorta. No adenopathy. There is an old healed fracture of the proximal left humerus. IMPRESSION: Left lower lobe airspace consolidation. Bibasilar interstitial fibrosis. Stable mild cardiomegaly. There is aortic atherosclerosis. Electronically Signed   By: Lowella Grip III M.D.   On: 03/12/2016 12:49   Dg Swallowing Func-speech Pathology  Result Date: 03/13/2016 Objective Swallowing Evaluation: Type of Study: MBS-Modified Barium Swallow Study Patient Details Name: ISHAMEL GARRIS MRN: [id number] Date of Birth: 05/07/1924 Today's Date: 03/13/2016 Time: SLP Start Time (ACUTE ONLY): 1320-SLP Stop Time (ACUTE ONLY): 1340 SLP Time Calculation (min) (ACUTE ONLY): 20 min Past Medical History: Past Medical History Diagnosis Date . Hypertension  . Seizures ([place])  . Dehydration  . Deafness  . Anxiety  . Reflux  . Arthritis  . Depression  . Atrial fibrillation ([place])  . Tremor, essential  . Seizure ([place])  . BPH (benign prostatic hyperplasia)  . Syncope  . Melanoma ([place])  . GERD (gastroesophageal reflux disease)  . Mitral regurgitation  . Abnormality of gait  . Tremor 09/27/2015 Past Surgical History: Past Surgical History Procedure Laterality Date . Unknown   . Appendectomy   . Cataract extraction w/ intraocular lens  implant, bilateral   . Esophagogastroduodenoscopy (egd) with propofol N/A 09/15/2014   Procedure: ESOPHAGOGASTRODUODENOSCOPY (EGD) WITH PROPOFOL;  Surgeon: [name], MD;  Location: [facility] ENDOSCOPY;  Service: Endoscopy;  Laterality: N/A; HPI: DREQUAN PERTILE is a 82 y.o. male with a Past Medical History of Parkinson's, HTN, SZR, deafness, reflux, A. fib, BPH, GERD, who presents with sepsis likely secondary to PNA (LLL HCAP vs aspiration). Patient presents  from nursing home orders likely also picked up a viral gastroenteritis causing severe dehydration. BEdside swallow evaluations 03/29/14, 11/11/12 indicate normal swallowing function. MBS 07/20/11 with a mild pharyngeal dysphagia with recommendations for regular solids, thin liquids. No Data Recorded Assessment / Plan / Recommendation CHL IP CLINICAL IMPRESSIONS 03/13/2016 Therapy Diagnosis Mild pharyngeal phase dysphagia Clinical Impression Patient presents with a very mild pharyngeal phase dysphagia characterized by deep flash penetration of thin liquids (in reality largely normal for age) but without frank penetration or aspiration. Cannot f/o deep penetration and/or aspiration over the course of a meal. Degree and frequency of  penetration decreased with use of cup sip rather than straw. When patient ready to advance solids, recommend dysphagia 3 (per daughter wishes due to mastication difficulty reported at ALF), thin liquids with aspiration precautions.   Impact on safety and function Mild aspiration risk   CHL IP TREATMENT RECOMMENDATION 03/13/2016 Treatment Recommendations Therapy as outlined in treatment plan  below   Prognosis 03/13/2016 Prognosis for Safe Diet Advancement Good Barriers to Reach Goals -- Barriers/Prognosis Comment -- CHL IP DIET RECOMMENDATION 03/13/2016 SLP Diet Recommendations Dysphagia 3 (Mech soft) solids;Thin liquid Liquid Administration via Cup;No straw Medication Administration Whole meds with liquid Compensations Slow rate;Small sips/bites Postural Changes Seated upright at 90 degrees   CHL IP OTHER RECOMMENDATIONS 03/13/2016 Recommended Consults -- Oral Care Recommendations Oral care BID Other Recommendations --   CHL IP FOLLOW UP RECOMMENDATIONS 03/13/2016 Follow up Recommendations None   CHL IP FREQUENCY AND DURATION 03/13/2016 Speech Therapy Frequency (ACUTE ONLY) min 1 x/week Treatment Duration 1 week      CHL IP ORAL PHASE 03/13/2016 Oral Phase WFL Oral - Pudding Teaspoon -- Oral -  Pudding Cup -- Oral - Honey Teaspoon -- Oral - Honey Cup -- Oral - Nectar Teaspoon -- Oral - Nectar Cup -- Oral - Nectar Straw -- Oral - Thin Teaspoon -- Oral - Thin Cup -- Oral - Thin Straw -- Oral - Puree -- Oral - Mech Soft -- Oral - Regular -- Oral - Multi-Consistency -- Oral - Pill -- Oral Phase - Comment --  CHL IP PHARYNGEAL PHASE 03/13/2016 Pharyngeal Phase Impaired Pharyngeal- Pudding Teaspoon -- Pharyngeal -- Pharyngeal- Pudding Cup -- Pharyngeal -- Pharyngeal- Honey Teaspoon -- Pharyngeal -- Pharyngeal- Honey Cup -- Pharyngeal -- Pharyngeal- Nectar Teaspoon -- Pharyngeal -- Pharyngeal- Nectar Cup -- Pharyngeal -- Pharyngeal- Nectar Straw -- Pharyngeal -- Pharyngeal- Thin Teaspoon -- Pharyngeal -- Pharyngeal- Thin Cup Delayed swallow initiation-pyriform sinuses;Delayed swallow initiation-vallecula;Penetration/Aspiration before swallow Pharyngeal Material enters airway, remains ABOVE vocal cords then ejected out Pharyngeal- Thin Straw Delayed swallow initiation-vallecula;Delayed swallow initiation-pyriform sinuses;Penetration/Aspiration before swallow Pharyngeal Material enters airway, remains ABOVE vocal cords then ejected out Pharyngeal- Puree WFL Pharyngeal -- Pharyngeal- Mechanical Soft WFL Pharyngeal -- Pharyngeal- Regular -- Pharyngeal -- Pharyngeal- Multi-consistency -- Pharyngeal -- Pharyngeal- Pill WFL Pharyngeal -- Pharyngeal Comment --  CHL IP CERVICAL ESOPHAGEAL PHASE 03/13/2016 Cervical Esophageal Phase WFL Pudding Teaspoon -- Pudding Cup -- Honey Teaspoon -- Honey Cup -- Nectar Teaspoon -- Nectar Cup -- Nectar Straw -- Thin Teaspoon -- Thin Cup -- Thin Straw -- Puree -- Mechanical Soft -- Regular -- Multi-consistency -- Pill -- Cervical Esophageal Comment -- No flowsheet data found. Gabriel Rainwater MA, CCC-SLP [phone] Gabriel Rainwater Meryl 03/13/2016, 1:56 PM               Microbiology: No results found for this or any previous visit (from the past 240 hour(s)).   Labs: Basic Metabolic  Panel:  Recent Labs Lab 03/18/16 0225 03/19/16 0458 03/21/16 0712 03/22/16 0527 03/23/16 0520  NA 130* 131* 133* 131* 132*  K 3.5 3.8 4.0 4.4 4.2  CL 93* 95* 94* 93* 96*  CO2 30 31 29 30 27   GLUCOSE 119* 98 93 94 98  BUN 14 18 21* 19 21*  CREATININE 0.88 0.88 0.95 0.93 0.88  CALCIUM 8.1* 8.3* 8.9 9.0 8.7*  MG  --   --   --  2.1  --    Liver Function Tests:  Recent Labs Lab 03/17/16 0210  AST 51*  ALT 59  ALKPHOS 53  BILITOT 0.7  PROT 5.2*  ALBUMIN 2.5*   No results for input(s): LIPASE, AMYLASE in the last 168 hours. No results for input(s): AMMONIA in the last 168 hours. CBC:  Recent Labs Lab 03/17/16 0210 03/18/16 0225 03/19/16 0458 03/21/16 0712 03/22/16 0527  WBC 7.3 6.5 5.5 6.3 8.4  HGB 10.3* 9.9* 9.5* 9.4* 10.3*  HCT 30.5* 29.7* 29.3* 29.2* 31.2*  MCV 86.9 87.9 88.5 89.6 87.9  PLT 231 226 272 316 400   Cardiac Enzymes: No results for input(s): CKTOTAL, CKMB, CKMBINDEX, TROPONINI in the last 168 hours. BNP: BNP (last 3 results) No results for input(s): BNP in the last 8760 hours.  ProBNP (last 3 results) No results for input(s): PROBNP in the last 8760 hours.  CBG:  Recent Labs Lab 03/17/16 0815  GLUCAP 115*       Signed:  THOMPSON,DANIEL MD.  [facility] 03/23/2016, 2:49 PM

## 2016-03-23 DIAGNOSIS — G40909 Epilepsy, unspecified, not intractable, without status epilepticus: Secondary | ICD-10-CM

## 2016-03-23 DIAGNOSIS — I5033 Acute on chronic diastolic (congestive) heart failure: Secondary | ICD-10-CM

## 2016-03-23 DIAGNOSIS — I480 Paroxysmal atrial fibrillation: Secondary | ICD-10-CM

## 2016-03-23 LAB — BASIC METABOLIC PANEL
ANION GAP: 9 (ref 5–15)
BUN: 21 mg/dL — ABNORMAL HIGH (ref 6–20)
CALCIUM: 8.7 mg/dL — AB (ref 8.9–10.3)
CO2: 27 mmol/L (ref 22–32)
Chloride: 96 mmol/L — ABNORMAL LOW (ref 101–111)
Creatinine, Ser: 0.88 mg/dL (ref 0.61–1.24)
GFR calc Af Amer: >60 mL/min (ref >60)
GFR calc non Af Amer: >60 mL/min (ref >60)
GLUCOSE: 98 mg/dL (ref 65–99)
POTASSIUM: 4.2 mmol/L (ref 3.5–5.1)
Sodium: 132 mmol/L — ABNORMAL LOW (ref 135–145)

## 2016-03-23 NOTE — Progress Notes (Signed)
PROGRESS NOTE    Barry Dean  [id number] DOB: 1924-03-27 DOA: 03/12/2016 PCP: [name], MD    Brief Narrative:  82 y.o. M Hx Anxiety, Depression, Parkinson's, BPH, MV Regurg, HTN, A. Fib, and GERD who presented from his assisted living facility due to acute encephalopathy. Initial evaluation revealed acute respiratory failure with hypoxia, hypotension, acute kidney injury, elevated lactic acid, and chest x-ray was concerning for pneumonia. Patient transferred from stepdown on 7/24   Assessment & Plan:   Principal Problem:   Acute respiratory failure with hypoxia (HCC) Active Problems:   Acute on chronic diastolic heart failure (HCC)   HCAP (healthcare-associated pneumonia)   Seizure disorder (HCC)   Hyponatremia   Benign essential HTN   Parkinson's disease (HCC)   PAF (paroxysmal atrial fibrillation) (HCC)   Sepsis (HCC)   Nausea & vomiting   Diarrhea   Acute encephalopathy   Acute kidney injury ([place])   Sepsis due to pneumonia (HCC)   Sepsis, unspecified organism ([place])   Hypokalemia   Hypomagnesemia   SIRS With lactic acidosis- resolved  -clinically appears to have been SIRS related to severe dehydration in the setting of ongoing watery diarrhea rather than an acute infection/sepsis   Acute hypoxic resp failure  -suspect the patient likely had an initial episode of aspiration in the setting of recurrent vomiting prior to his admit  - f/u CXR 7/20 was profoundly worse but in the setting of aggressive volume resuscitation, antibiotics were discontinued after 3 days of treatment, pt was initially improving  - Currently O2 sats 92% on 2 L, continued to wean O2 as tolerated, chest x-ray showed persistent diffuse right lung infiltrate and left lower lobe infiltrates, small left pleural effusion however no fevers or chills or leukocytosis - Still 7.8 L positive on I/O's, patient received 2 doses of IV Lasix on 7/23, 7/24. IV Lasix 03/22/2016. BP borderline. -  Low-grade fevers resolved on Augmentin, chest x-ray improved.  Acute kidney injury - resolved -Cr 1.54 on admission, related to extreme dehydration in setting of nausea vomiting diarrhea  Acute encephalopathy - Much more alert and awake today, oriented to self -CT head without acute abnormalities - [id number] and folic acid normal   Acute on Chronic Grade 2 Diastolic CHF likely due to pulmonary edema from aggressive volume resuscitation - 2-D echo showed EF of 60-65% with grade 2 diastolic dysfunction on [id number] - Still 7.8 L positive on I/O's, patient received 2 doses of IV Lasix on 7/23, 7/24. Some clinical improvement however still with O2 requirements. Blood pressure soft. Discontinued Norvasc for now. BP borderline. Hold lasix.  Watery Diarrhea - Diarrhea has improved significantly. Rectal tube in place, C. difficile negative, GI pathogen panel negative - Placed on cholestyramine for 3 doses, continue loperamide. Discontinue rectal tube.  Nausea and vomiting- resolved  -appears to have resolved for now   Hypokalemia  -Due to GI losses and diureses, repleted.  Hypertension -Blood pressure stable. Hold Norvasc.   DVT prophylaxis: Lovenox Code Status: DO NOT RESUSCITATE Family Communication: Updated patient and wife at bedside. Disposition Plan: ALF today.   Consultants:   PCCM: Dr Elsworth Soho 03/12/2016  Procedures:   CT Abd/Pelvis 03/12/2016  CT Head 03/12/2016  Echo 03/14/2016  Chest x-ray 03/21/2016, 03/19/2016, 03/17/2016, 03/15/2016, 03/13/2016  Antimicrobials:   Augmentin 03/20/2016  IV Cefepime 03/12/16>>>>>03/14/2016  IV Vancomycin 03/12/2016>>>>03/14/2016   Subjective: Patient states shortness of breath has improved. Patient denies any chest pain. Patient still with hypoxia on room air.  Objective: Vitals:  03/22/16 0505 03/22/16 1406 03/22/16 2205 03/23/16 0533  BP: 136/63 (!) 130/54 (!) 137/51 111/64  Pulse: 68 92 76 72  Resp: 16 18 20 20   Temp: 98 F  (36.7 C) 98.8 F (37.1 C) 97.9 F (36.6 C) 97.5 F (36.4 C)  TempSrc: Oral Oral  Oral  SpO2: 94% 91% 93% 92%  Weight: 92.2 kg (203 lb 4.8 oz)   89.6 kg (197 lb 8 oz)  Height:        Intake/Output Summary (Last 24 hours) at 03/23/16 1440 Last data filed at 03/23/16 0900  Gross per 24 hour  Intake              153 ml  Output              950 ml  Net             -797 ml   Filed Weights   03/21/16 0540 03/22/16 0505 03/23/16 0533  Weight: 91.3 kg (201 lb 4.5 oz) 92.2 kg (203 lb 4.8 oz) 89.6 kg (197 lb 8 oz)    Examination:  General exam: Appears calm and comfortable  Respiratory system: Clear to auscultation anterior lung fields. Respiratory effort normal. Cardiovascular system: S1 & S2 heard, RRR. No JVD, murmurs, rubs, gallops or clicks. Trace bilateral lower extremity edema. Gastrointestinal system: Abdomen is nondistended, soft and nontender. No organomegaly or masses felt. Normal bowel sounds heard. Central nervous system: Alert and oriented. No focal neurological deficits. Extremities: Symmetric 5 x 5 power. Skin: No rashes, lesions or ulcers Psychiatry: Judgement and insight appear normal. Mood & affect appropriate.     Data Reviewed: I have personally reviewed following labs and imaging studies  CBC:  Recent Labs Lab 03/17/16 0210 03/18/16 0225 03/19/16 0458 03/21/16 0712 03/22/16 0527  WBC 7.3 6.5 5.5 6.3 8.4  HGB 10.3* 9.9* 9.5* 9.4* 10.3*  HCT 30.5* 29.7* 29.3* 29.2* 31.2*  MCV 86.9 87.9 88.5 89.6 87.9  PLT 231 226 272 316 [id number]   Basic Metabolic Panel:  Recent Labs Lab 03/18/16 0225 03/19/16 0458 03/21/16 0712 03/22/16 0527 03/23/16 0520  NA 130* 131* 133* 131* 132*  K 3.5 3.8 4.0 4.4 4.2  CL 93* 95* 94* 93* 96*  CO2 30 31 29 30 27   GLUCOSE 119* 98 93 94 98  BUN 14 18 21* 19 21*  CREATININE 0.88 0.88 0.95 0.93 0.88  CALCIUM 8.1* 8.3* 8.9 9.0 8.7*  MG  --   --   --  2.1  --    GFR: Estimated Creatinine Clearance: 63.1 mL/min (by C-G  formula based on SCr of 0.88 mg/dL). Liver Function Tests:  Recent Labs Lab 03/17/16 0210  AST 51*  ALT 59  ALKPHOS 53  BILITOT 0.7  PROT 5.2*  ALBUMIN 2.5*   No results for input(s): LIPASE, AMYLASE in the last 168 hours. No results for input(s): AMMONIA in the last 168 hours. Coagulation Profile: No results for input(s): INR, PROTIME in the last 168 hours. Cardiac Enzymes: No results for input(s): CKTOTAL, CKMB, CKMBINDEX, TROPONINI in the last 168 hours. BNP (last 3 results) No results for input(s): PROBNP in the last 8760 hours. HbA1C: No results for input(s): HGBA1C in the last 72 hours. CBG:  Recent Labs Lab 03/17/16 0815  GLUCAP 115*   Lipid Profile: No results for input(s): CHOL, HDL, LDLCALC, TRIG, CHOLHDL, LDLDIRECT in the last 72 hours. Thyroid Function Tests: No results for input(s): TSH, T4TOTAL, FREET4, T3FREE, THYROIDAB in the last 72 hours. Anemia  Panel: No results for input(s): VITAMINB12, FOLATE, FERRITIN, TIBC, IRON, RETICCTPCT in the last 72 hours. Sepsis Labs: No results for input(s): PROCALCITON, LATICACIDVEN in the last 168 hours.  No results found for this or any previous visit (from the past 240 hour(s)).       Radiology Studies: No results found.      Scheduled Meds: . amoxicillin-clavulanate  1 tablet Oral Q12H  . aspirin  81 mg Oral QPM  . enoxaparin (LOVENOX) injection  40 mg Subcutaneous Q24H  . feeding supplement (ENSURE ENLIVE)  237 mL Oral Q24H  . lacosamide  150 mg Oral QHS  . lacosamide  50 mg Oral Daily  . lamoTRIgine  75 mg Oral BID  . loperamide  4 mg Oral Q8H  . sodium chloride flush  3 mL Intravenous Q12H   Continuous Infusions:    LOS: 11 days    Time spent: 35 minutes    Joven Mom, MD [facility] Pager [phone]  If 7PM-7AM, please contact night-coverage [email or web address] Password [email or web address] 03/23/2016, 2:40 PM

## 2016-03-23 NOTE — Progress Notes (Signed)
[name] Anticipated DC date: 03/23/16 Family notified: Spouse Transport by: Corey Harold   Per MD patient ready for DC to [facility]. RN, patient, patient's family, and facility notified of DC. RN given number for report. DC packet on chart. Ambulance transport requested for patient.   CSW signing off.  [name], [place] Social Worker [phone]

## 2016-03-23 NOTE — NC FL2 (Signed)
[place] MEDICAID FL2 LEVEL OF CARE SCREENING TOOL     IDENTIFICATION  Patient Name: Barry Dean Birthdate: 06-Mar-1924 Sex: male Admission Date (Current Location): 03/12/2016  [facility] [place] Number:  Herbalist and Address:  [facility] [place] [address], [place], [place] [address]      Provider Number: [id number]  Attending Physician Name and Address:  Eugenie Filler, MD  Relative Name and Phone Number:  Arzola,Susan [phone] or [phone]    Current Level of Care: Hospital Recommended Level of Care: [place] Prior Approval Number:    Date Approved/Denied:   PASRR Number: [id number]  Discharge Plan: Other (Comment) (ALF)    Current Diagnoses: Patient Active Problem List   Diagnosis Date Noted  . Acute on chronic diastolic heart failure ([place]) 03/22/2016  . Sepsis, unspecified organism ([place])   . Hypokalemia   . Hypomagnesemia   . Sepsis due to pneumonia ([place])   . Sepsis ([place]) 03/12/2016  . Nausea & vomiting 03/12/2016  . Diarrhea 03/12/2016  . Acute encephalopathy 03/12/2016  . Acute kidney injury ([place]) 03/12/2016  . Acute respiratory failure with hypoxia ([place]) 03/12/2016  . Septic shock ([place])   . Abnormality of gait 09/27/2015  . Tremor 09/27/2015  . Undiagnosed cardiac murmurs 03/06/2015  . Syncope and collapse 03/05/2015  . Parkinson's disease ([place]) 03/05/2015  . PAF (paroxysmal atrial fibrillation) ([place]) 03/05/2015  . Bradycardia 03/05/2015  . Deafness 03/05/2015  . Mild mitral regurgitation 03/05/2015  . Emesis, persistent 09/14/2014  . Benign essential HTN 09/14/2014  . Syncope 09/13/2013  . ?? Respiratory failure 09/13/2013  . Noninfectious gastroenteritis and colitis 11/12/2012  . Acute and chronic respiratory failure 11/12/2012  . Community acquired pneumonia 11/12/2012  . HCAP (healthcare-associated pneumonia) 07/18/2011  . Seizure disorder ([place]) 07/18/2011  . HTN  (hypertension) 07/18/2011  . Hyponatremia 07/18/2011    Orientation RESPIRATION BLADDER Height & Weight     Self, Place, Situation, Time  O2 (Nasal Cannula 2L) Incontinent Weight: 89.6 kg (197 lb 8 oz) Height:  6\' 1"  (185.4 cm)  BEHAVIORAL SYMPTOMS/MOOD NEUROLOGICAL BOWEL NUTRITION STATUS    Convulsions/Seizures Continent Diet (Mechanical Soft)  AMBULATORY STATUS COMMUNICATION OF NEEDS Skin   Extensive Assist Verbally Normal                       Personal Care Assistance Level of Assistance  Dressing, Bathing, Feeding Bathing Assistance: Maximum assistance Feeding assistance: Limited assistance Dressing Assistance: Maximum assistance     Functional Limitations Info  Sight, Hearing, Speech Sight Info: Adequate Hearing Info: Impaired Speech Info: Adequate    SPECIAL CARE FACTORS FREQUENCY  PT (By licensed PT), OT (By licensed OT), Speech therapy     PT Frequency: 5x/weel OT Frequency: 3x/week            Contractures Contractures Info: Not present    Additional Factors Info  Code Status, Allergies, Isolation Precautions Code Status Info: DNR Allergies Info: Topamax, Uroxatral Alfuzosin Hydrochloride, Valium     Isolation Precautions Info: MRSA     Current Medications (03/23/2016):   Discharge Medications: START taking these medications   Details  amoxicillin-clavulanate (AUGMENTIN) 875-125 MG tablet Take 1 tablet by mouth every 12 (twelve) hours. Qty: 10 tablet, Refills: 0    dextromethorphan-guaiFENesin (MUCINEX DM) 30-600 MG 12hr tablet Take 1 tablet by mouth 2 (two) times daily as needed for cough. Qty: 10 tablet, Refills: 0    enoxaparin (LOVENOX) 40 MG/0.4ML injection Inject  0.4 mLs (40 mg total) into the skin daily. Qty: 0 Syringe    feeding supplement, ENSURE ENLIVE, (ENSURE ENLIVE) LIQD Take 237 mLs by mouth daily. Qty: 237 mL, Refills: 0    ipratropium-albuterol (DUONEB) 0.5-2.5 (3) MG/3ML SOLN Take 3 mLs by nebulization every 6 (six)  hours as needed. Qty: 360 mL, Refills: 0         CONTINUE these medications which have CHANGED   Details  amLODipine-benazepril (LOTREL) 5-20 MG capsule Take 1 capsule by mouth daily. Resume in 4 days.    HYDROcodone-acetaminophen (NORCO/VICODIN) 5-325 MG tablet Take 1 tablet by mouth every 8 (eight) hours as needed for moderate pain. Qty: 10 tablet, Refills: 0    lamoTRIgine (LAMICTAL) 25 MG tablet Take 3 tablets (75 mg total) by mouth 2 (two) times daily.    pantoprazole (PROTONIX) 40 MG tablet Take 1 tablet (40 mg total) by mouth daily.         CONTINUE these medications which have NOT CHANGED   Details  aspirin 81 MG chewable tablet Chew 1 tablet (81 mg total) by mouth every evening.    lacosamide (VIMPAT) 50 MG TABS tablet Take 1 tablet in the AM (50 mg) and 3 tablets (150 mg ) in the PM. Qty: 120 tablet, Refills: 4    mineral oil external liquid Place 3 drops into both ears every 7 (seven) days.    vitamin B-12 (CYANOCOBALAMIN) 1000 MCG tablet Take 2,000 mcg by mouth daily.    acetaminophen (TYLENOL) 500 MG tablet Take 500 mg by mouth every 6 (six) hours as needed for pain. Not to exceed 3 gms of acetaminophen in 24 hours    sennosides-docusate sodium (SENOKOT-S) 8.6-50 MG tablet Take 1 tablet by mouth at bedtime as needed for constipation.     Relevant Imaging Results:  Relevant Lab Results:   Additional Information SSN [id number]  Benard Halsted, LCSWA

## 2016-03-23 NOTE — Care Management Important Message (Signed)
Important Message  Patient Details  Name: Barry Dean MRN: [id number] Date of Birth: 12-02-1923   Medicare Important Message Given:  Yes    Carles Collet, RN 03/23/2016, 9:11 AM

## 2016-03-24 DIAGNOSIS — J189 Pneumonia, unspecified organism: Secondary | ICD-10-CM | POA: Diagnosis not present

## 2016-03-24 DIAGNOSIS — M1990 Unspecified osteoarthritis, unspecified site: Secondary | ICD-10-CM | POA: Diagnosis not present

## 2016-03-24 DIAGNOSIS — I5033 Acute on chronic diastolic (congestive) heart failure: Secondary | ICD-10-CM | POA: Diagnosis not present

## 2016-03-24 DIAGNOSIS — I110 Hypertensive heart disease with heart failure: Secondary | ICD-10-CM | POA: Diagnosis not present

## 2016-03-24 DIAGNOSIS — G20 Parkinson's disease: Secondary | ICD-10-CM | POA: Diagnosis not present

## 2016-03-24 DIAGNOSIS — L89610 Pressure ulcer of right heel, unstageable: Secondary | ICD-10-CM | POA: Diagnosis not present

## 2016-03-26 DIAGNOSIS — J189 Pneumonia, unspecified organism: Secondary | ICD-10-CM | POA: Diagnosis not present

## 2016-03-26 DIAGNOSIS — G20 Parkinson's disease: Secondary | ICD-10-CM | POA: Diagnosis not present

## 2016-03-26 DIAGNOSIS — M1990 Unspecified osteoarthritis, unspecified site: Secondary | ICD-10-CM | POA: Diagnosis not present

## 2016-03-26 DIAGNOSIS — I110 Hypertensive heart disease with heart failure: Secondary | ICD-10-CM | POA: Diagnosis not present

## 2016-03-26 DIAGNOSIS — L89610 Pressure ulcer of right heel, unstageable: Secondary | ICD-10-CM | POA: Diagnosis not present

## 2016-03-26 DIAGNOSIS — I5033 Acute on chronic diastolic (congestive) heart failure: Secondary | ICD-10-CM | POA: Diagnosis not present

## 2016-03-27 DIAGNOSIS — Z993 Dependence on wheelchair: Secondary | ICD-10-CM | POA: Diagnosis not present

## 2016-03-27 DIAGNOSIS — E639 Nutritional deficiency, unspecified: Secondary | ICD-10-CM | POA: Diagnosis not present

## 2016-03-27 DIAGNOSIS — E878 Other disorders of electrolyte and fluid balance, not elsewhere classified: Secondary | ICD-10-CM | POA: Diagnosis not present

## 2016-03-27 DIAGNOSIS — Z79899 Other long term (current) drug therapy: Secondary | ICD-10-CM | POA: Diagnosis not present

## 2016-03-27 DIAGNOSIS — R944 Abnormal results of kidney function studies: Secondary | ICD-10-CM | POA: Diagnosis not present

## 2016-03-27 DIAGNOSIS — G20 Parkinson's disease: Secondary | ICD-10-CM | POA: Diagnosis not present

## 2016-03-27 DIAGNOSIS — I10 Essential (primary) hypertension: Secondary | ICD-10-CM | POA: Diagnosis not present

## 2016-03-27 DIAGNOSIS — G40909 Epilepsy, unspecified, not intractable, without status epilepticus: Secondary | ICD-10-CM | POA: Diagnosis not present

## 2016-03-27 DIAGNOSIS — J9600 Acute respiratory failure, unspecified whether with hypoxia or hypercapnia: Secondary | ICD-10-CM | POA: Diagnosis not present

## 2016-03-27 DIAGNOSIS — J189 Pneumonia, unspecified organism: Secondary | ICD-10-CM | POA: Diagnosis not present

## 2016-03-27 DIAGNOSIS — C439 Malignant melanoma of skin, unspecified: Secondary | ICD-10-CM | POA: Diagnosis not present

## 2016-03-28 DIAGNOSIS — I5033 Acute on chronic diastolic (congestive) heart failure: Secondary | ICD-10-CM | POA: Diagnosis not present

## 2016-03-28 DIAGNOSIS — G20 Parkinson's disease: Secondary | ICD-10-CM | POA: Diagnosis not present

## 2016-03-28 DIAGNOSIS — I110 Hypertensive heart disease with heart failure: Secondary | ICD-10-CM | POA: Diagnosis not present

## 2016-03-28 DIAGNOSIS — M1990 Unspecified osteoarthritis, unspecified site: Secondary | ICD-10-CM | POA: Diagnosis not present

## 2016-03-28 DIAGNOSIS — J189 Pneumonia, unspecified organism: Secondary | ICD-10-CM | POA: Diagnosis not present

## 2016-03-28 DIAGNOSIS — L89610 Pressure ulcer of right heel, unstageable: Secondary | ICD-10-CM | POA: Diagnosis not present

## 2016-03-29 DIAGNOSIS — J189 Pneumonia, unspecified organism: Secondary | ICD-10-CM | POA: Diagnosis not present

## 2016-03-29 DIAGNOSIS — G20 Parkinson's disease: Secondary | ICD-10-CM | POA: Diagnosis not present

## 2016-03-29 DIAGNOSIS — M1990 Unspecified osteoarthritis, unspecified site: Secondary | ICD-10-CM | POA: Diagnosis not present

## 2016-03-29 DIAGNOSIS — I110 Hypertensive heart disease with heart failure: Secondary | ICD-10-CM | POA: Diagnosis not present

## 2016-03-29 DIAGNOSIS — I5033 Acute on chronic diastolic (congestive) heart failure: Secondary | ICD-10-CM | POA: Diagnosis not present

## 2016-03-29 DIAGNOSIS — L89610 Pressure ulcer of right heel, unstageable: Secondary | ICD-10-CM | POA: Diagnosis not present

## 2016-03-30 DIAGNOSIS — L89610 Pressure ulcer of right heel, unstageable: Secondary | ICD-10-CM | POA: Diagnosis not present

## 2016-03-30 DIAGNOSIS — M1990 Unspecified osteoarthritis, unspecified site: Secondary | ICD-10-CM | POA: Diagnosis not present

## 2016-03-30 DIAGNOSIS — I5033 Acute on chronic diastolic (congestive) heart failure: Secondary | ICD-10-CM | POA: Diagnosis not present

## 2016-03-30 DIAGNOSIS — J189 Pneumonia, unspecified organism: Secondary | ICD-10-CM | POA: Diagnosis not present

## 2016-03-30 DIAGNOSIS — I110 Hypertensive heart disease with heart failure: Secondary | ICD-10-CM | POA: Diagnosis not present

## 2016-03-30 DIAGNOSIS — G20 Parkinson's disease: Secondary | ICD-10-CM | POA: Diagnosis not present

## 2016-04-02 DIAGNOSIS — G20 Parkinson's disease: Secondary | ICD-10-CM | POA: Diagnosis not present

## 2016-04-02 DIAGNOSIS — I110 Hypertensive heart disease with heart failure: Secondary | ICD-10-CM | POA: Diagnosis not present

## 2016-04-02 DIAGNOSIS — I5033 Acute on chronic diastolic (congestive) heart failure: Secondary | ICD-10-CM | POA: Diagnosis not present

## 2016-04-02 DIAGNOSIS — M1990 Unspecified osteoarthritis, unspecified site: Secondary | ICD-10-CM | POA: Diagnosis not present

## 2016-04-02 DIAGNOSIS — J189 Pneumonia, unspecified organism: Secondary | ICD-10-CM | POA: Diagnosis not present

## 2016-04-02 DIAGNOSIS — L89610 Pressure ulcer of right heel, unstageable: Secondary | ICD-10-CM | POA: Diagnosis not present

## 2016-04-03 DIAGNOSIS — L821 Other seborrheic keratosis: Secondary | ICD-10-CM | POA: Diagnosis not present

## 2016-04-03 DIAGNOSIS — I110 Hypertensive heart disease with heart failure: Secondary | ICD-10-CM | POA: Diagnosis not present

## 2016-04-03 DIAGNOSIS — G20 Parkinson's disease: Secondary | ICD-10-CM | POA: Diagnosis not present

## 2016-04-03 DIAGNOSIS — M1990 Unspecified osteoarthritis, unspecified site: Secondary | ICD-10-CM | POA: Diagnosis not present

## 2016-04-03 DIAGNOSIS — Z79899 Other long term (current) drug therapy: Secondary | ICD-10-CM | POA: Diagnosis not present

## 2016-04-03 DIAGNOSIS — I5033 Acute on chronic diastolic (congestive) heart failure: Secondary | ICD-10-CM | POA: Diagnosis not present

## 2016-04-03 DIAGNOSIS — L89610 Pressure ulcer of right heel, unstageable: Secondary | ICD-10-CM | POA: Diagnosis not present

## 2016-04-03 DIAGNOSIS — R2231 Localized swelling, mass and lump, right upper limb: Secondary | ICD-10-CM | POA: Diagnosis not present

## 2016-04-03 DIAGNOSIS — J189 Pneumonia, unspecified organism: Secondary | ICD-10-CM | POA: Diagnosis not present

## 2016-04-03 DIAGNOSIS — L989 Disorder of the skin and subcutaneous tissue, unspecified: Secondary | ICD-10-CM | POA: Diagnosis not present

## 2016-04-04 DIAGNOSIS — M1990 Unspecified osteoarthritis, unspecified site: Secondary | ICD-10-CM | POA: Diagnosis not present

## 2016-04-04 DIAGNOSIS — G20 Parkinson's disease: Secondary | ICD-10-CM | POA: Diagnosis not present

## 2016-04-04 DIAGNOSIS — I110 Hypertensive heart disease with heart failure: Secondary | ICD-10-CM | POA: Diagnosis not present

## 2016-04-04 DIAGNOSIS — L89610 Pressure ulcer of right heel, unstageable: Secondary | ICD-10-CM | POA: Diagnosis not present

## 2016-04-04 DIAGNOSIS — I5033 Acute on chronic diastolic (congestive) heart failure: Secondary | ICD-10-CM | POA: Diagnosis not present

## 2016-04-04 DIAGNOSIS — J189 Pneumonia, unspecified organism: Secondary | ICD-10-CM | POA: Diagnosis not present

## 2016-04-05 DIAGNOSIS — I5033 Acute on chronic diastolic (congestive) heart failure: Secondary | ICD-10-CM | POA: Diagnosis not present

## 2016-04-05 DIAGNOSIS — G20 Parkinson's disease: Secondary | ICD-10-CM | POA: Diagnosis not present

## 2016-04-05 DIAGNOSIS — I110 Hypertensive heart disease with heart failure: Secondary | ICD-10-CM | POA: Diagnosis not present

## 2016-04-05 DIAGNOSIS — L89610 Pressure ulcer of right heel, unstageable: Secondary | ICD-10-CM | POA: Diagnosis not present

## 2016-04-05 DIAGNOSIS — J189 Pneumonia, unspecified organism: Secondary | ICD-10-CM | POA: Diagnosis not present

## 2016-04-05 DIAGNOSIS — M1990 Unspecified osteoarthritis, unspecified site: Secondary | ICD-10-CM | POA: Diagnosis not present

## 2016-04-09 DIAGNOSIS — M1990 Unspecified osteoarthritis, unspecified site: Secondary | ICD-10-CM | POA: Diagnosis not present

## 2016-04-09 DIAGNOSIS — I5033 Acute on chronic diastolic (congestive) heart failure: Secondary | ICD-10-CM | POA: Diagnosis not present

## 2016-04-09 DIAGNOSIS — J189 Pneumonia, unspecified organism: Secondary | ICD-10-CM | POA: Diagnosis not present

## 2016-04-09 DIAGNOSIS — L89610 Pressure ulcer of right heel, unstageable: Secondary | ICD-10-CM | POA: Diagnosis not present

## 2016-04-09 DIAGNOSIS — I110 Hypertensive heart disease with heart failure: Secondary | ICD-10-CM | POA: Diagnosis not present

## 2016-04-09 DIAGNOSIS — G20 Parkinson's disease: Secondary | ICD-10-CM | POA: Diagnosis not present

## 2016-04-10 DIAGNOSIS — Z9981 Dependence on supplemental oxygen: Secondary | ICD-10-CM | POA: Diagnosis not present

## 2016-04-10 DIAGNOSIS — K5901 Slow transit constipation: Secondary | ICD-10-CM | POA: Diagnosis not present

## 2016-04-10 DIAGNOSIS — G4089 Other seizures: Secondary | ICD-10-CM | POA: Diagnosis not present

## 2016-04-10 DIAGNOSIS — Z79899 Other long term (current) drug therapy: Secondary | ICD-10-CM | POA: Diagnosis not present

## 2016-04-10 DIAGNOSIS — R944 Abnormal results of kidney function studies: Secondary | ICD-10-CM | POA: Diagnosis not present

## 2016-04-10 DIAGNOSIS — Z85820 Personal history of malignant melanoma of skin: Secondary | ICD-10-CM | POA: Diagnosis not present

## 2016-04-11 DIAGNOSIS — G20 Parkinson's disease: Secondary | ICD-10-CM | POA: Diagnosis not present

## 2016-04-11 DIAGNOSIS — M1990 Unspecified osteoarthritis, unspecified site: Secondary | ICD-10-CM | POA: Diagnosis not present

## 2016-04-11 DIAGNOSIS — I110 Hypertensive heart disease with heart failure: Secondary | ICD-10-CM | POA: Diagnosis not present

## 2016-04-11 DIAGNOSIS — I5033 Acute on chronic diastolic (congestive) heart failure: Secondary | ICD-10-CM | POA: Diagnosis not present

## 2016-04-11 DIAGNOSIS — L89610 Pressure ulcer of right heel, unstageable: Secondary | ICD-10-CM | POA: Diagnosis not present

## 2016-04-11 DIAGNOSIS — J189 Pneumonia, unspecified organism: Secondary | ICD-10-CM | POA: Diagnosis not present

## 2016-04-12 DIAGNOSIS — I110 Hypertensive heart disease with heart failure: Secondary | ICD-10-CM | POA: Diagnosis not present

## 2016-04-12 DIAGNOSIS — G20 Parkinson's disease: Secondary | ICD-10-CM | POA: Diagnosis not present

## 2016-04-12 DIAGNOSIS — L89610 Pressure ulcer of right heel, unstageable: Secondary | ICD-10-CM | POA: Diagnosis not present

## 2016-04-12 DIAGNOSIS — M1990 Unspecified osteoarthritis, unspecified site: Secondary | ICD-10-CM | POA: Diagnosis not present

## 2016-04-12 DIAGNOSIS — I5033 Acute on chronic diastolic (congestive) heart failure: Secondary | ICD-10-CM | POA: Diagnosis not present

## 2016-04-12 DIAGNOSIS — J189 Pneumonia, unspecified organism: Secondary | ICD-10-CM | POA: Diagnosis not present

## 2016-04-16 DIAGNOSIS — M1990 Unspecified osteoarthritis, unspecified site: Secondary | ICD-10-CM | POA: Diagnosis not present

## 2016-04-16 DIAGNOSIS — I5033 Acute on chronic diastolic (congestive) heart failure: Secondary | ICD-10-CM | POA: Diagnosis not present

## 2016-04-16 DIAGNOSIS — I110 Hypertensive heart disease with heart failure: Secondary | ICD-10-CM | POA: Diagnosis not present

## 2016-04-16 DIAGNOSIS — L89610 Pressure ulcer of right heel, unstageable: Secondary | ICD-10-CM | POA: Diagnosis not present

## 2016-04-16 DIAGNOSIS — J189 Pneumonia, unspecified organism: Secondary | ICD-10-CM | POA: Diagnosis not present

## 2016-04-16 DIAGNOSIS — G20 Parkinson's disease: Secondary | ICD-10-CM | POA: Diagnosis not present

## 2016-04-17 DIAGNOSIS — J189 Pneumonia, unspecified organism: Secondary | ICD-10-CM | POA: Diagnosis not present

## 2016-04-17 DIAGNOSIS — L89610 Pressure ulcer of right heel, unstageable: Secondary | ICD-10-CM | POA: Diagnosis not present

## 2016-04-17 DIAGNOSIS — M1990 Unspecified osteoarthritis, unspecified site: Secondary | ICD-10-CM | POA: Diagnosis not present

## 2016-04-17 DIAGNOSIS — I110 Hypertensive heart disease with heart failure: Secondary | ICD-10-CM | POA: Diagnosis not present

## 2016-04-17 DIAGNOSIS — G20 Parkinson's disease: Secondary | ICD-10-CM | POA: Diagnosis not present

## 2016-04-17 DIAGNOSIS — I5033 Acute on chronic diastolic (congestive) heart failure: Secondary | ICD-10-CM | POA: Diagnosis not present

## 2016-04-19 DIAGNOSIS — I5033 Acute on chronic diastolic (congestive) heart failure: Secondary | ICD-10-CM | POA: Diagnosis not present

## 2016-04-19 DIAGNOSIS — G20 Parkinson's disease: Secondary | ICD-10-CM | POA: Diagnosis not present

## 2016-04-19 DIAGNOSIS — J189 Pneumonia, unspecified organism: Secondary | ICD-10-CM | POA: Diagnosis not present

## 2016-04-19 DIAGNOSIS — L89610 Pressure ulcer of right heel, unstageable: Secondary | ICD-10-CM | POA: Diagnosis not present

## 2016-04-19 DIAGNOSIS — M1990 Unspecified osteoarthritis, unspecified site: Secondary | ICD-10-CM | POA: Diagnosis not present

## 2016-04-19 DIAGNOSIS — I110 Hypertensive heart disease with heart failure: Secondary | ICD-10-CM | POA: Diagnosis not present

## 2016-04-20 DIAGNOSIS — I5033 Acute on chronic diastolic (congestive) heart failure: Secondary | ICD-10-CM | POA: Diagnosis not present

## 2016-04-20 DIAGNOSIS — I110 Hypertensive heart disease with heart failure: Secondary | ICD-10-CM | POA: Diagnosis not present

## 2016-04-20 DIAGNOSIS — M1990 Unspecified osteoarthritis, unspecified site: Secondary | ICD-10-CM | POA: Diagnosis not present

## 2016-04-20 DIAGNOSIS — G20 Parkinson's disease: Secondary | ICD-10-CM | POA: Diagnosis not present

## 2016-04-20 DIAGNOSIS — J189 Pneumonia, unspecified organism: Secondary | ICD-10-CM | POA: Diagnosis not present

## 2016-04-20 DIAGNOSIS — L89610 Pressure ulcer of right heel, unstageable: Secondary | ICD-10-CM | POA: Diagnosis not present

## 2016-04-23 DIAGNOSIS — L89610 Pressure ulcer of right heel, unstageable: Secondary | ICD-10-CM | POA: Diagnosis not present

## 2016-04-23 DIAGNOSIS — G20 Parkinson's disease: Secondary | ICD-10-CM | POA: Diagnosis not present

## 2016-04-23 DIAGNOSIS — I110 Hypertensive heart disease with heart failure: Secondary | ICD-10-CM | POA: Diagnosis not present

## 2016-04-23 DIAGNOSIS — I5033 Acute on chronic diastolic (congestive) heart failure: Secondary | ICD-10-CM | POA: Diagnosis not present

## 2016-04-23 DIAGNOSIS — J189 Pneumonia, unspecified organism: Secondary | ICD-10-CM | POA: Diagnosis not present

## 2016-04-23 DIAGNOSIS — M1990 Unspecified osteoarthritis, unspecified site: Secondary | ICD-10-CM | POA: Diagnosis not present

## 2016-04-24 DIAGNOSIS — J189 Pneumonia, unspecified organism: Secondary | ICD-10-CM | POA: Diagnosis not present

## 2016-04-24 DIAGNOSIS — M1990 Unspecified osteoarthritis, unspecified site: Secondary | ICD-10-CM | POA: Diagnosis not present

## 2016-04-24 DIAGNOSIS — I5033 Acute on chronic diastolic (congestive) heart failure: Secondary | ICD-10-CM | POA: Diagnosis not present

## 2016-04-24 DIAGNOSIS — G20 Parkinson's disease: Secondary | ICD-10-CM | POA: Diagnosis not present

## 2016-04-24 DIAGNOSIS — I110 Hypertensive heart disease with heart failure: Secondary | ICD-10-CM | POA: Diagnosis not present

## 2016-04-24 DIAGNOSIS — L89610 Pressure ulcer of right heel, unstageable: Secondary | ICD-10-CM | POA: Diagnosis not present

## 2016-04-26 DIAGNOSIS — G20 Parkinson's disease: Secondary | ICD-10-CM | POA: Diagnosis not present

## 2016-04-26 DIAGNOSIS — J189 Pneumonia, unspecified organism: Secondary | ICD-10-CM | POA: Diagnosis not present

## 2016-04-26 DIAGNOSIS — L89610 Pressure ulcer of right heel, unstageable: Secondary | ICD-10-CM | POA: Diagnosis not present

## 2016-04-26 DIAGNOSIS — I110 Hypertensive heart disease with heart failure: Secondary | ICD-10-CM | POA: Diagnosis not present

## 2016-04-26 DIAGNOSIS — M1990 Unspecified osteoarthritis, unspecified site: Secondary | ICD-10-CM | POA: Diagnosis not present

## 2016-04-26 DIAGNOSIS — I5033 Acute on chronic diastolic (congestive) heart failure: Secondary | ICD-10-CM | POA: Diagnosis not present

## 2016-04-27 DIAGNOSIS — L89610 Pressure ulcer of right heel, unstageable: Secondary | ICD-10-CM | POA: Diagnosis not present

## 2016-04-27 DIAGNOSIS — M1990 Unspecified osteoarthritis, unspecified site: Secondary | ICD-10-CM | POA: Diagnosis not present

## 2016-04-27 DIAGNOSIS — I5033 Acute on chronic diastolic (congestive) heart failure: Secondary | ICD-10-CM | POA: Diagnosis not present

## 2016-04-27 DIAGNOSIS — I110 Hypertensive heart disease with heart failure: Secondary | ICD-10-CM | POA: Diagnosis not present

## 2016-04-27 DIAGNOSIS — G20 Parkinson's disease: Secondary | ICD-10-CM | POA: Diagnosis not present

## 2016-04-27 DIAGNOSIS — J189 Pneumonia, unspecified organism: Secondary | ICD-10-CM | POA: Diagnosis not present

## 2016-05-01 DIAGNOSIS — I5033 Acute on chronic diastolic (congestive) heart failure: Secondary | ICD-10-CM | POA: Diagnosis not present

## 2016-05-01 DIAGNOSIS — L89610 Pressure ulcer of right heel, unstageable: Secondary | ICD-10-CM | POA: Diagnosis not present

## 2016-05-01 DIAGNOSIS — J189 Pneumonia, unspecified organism: Secondary | ICD-10-CM | POA: Diagnosis not present

## 2016-05-01 DIAGNOSIS — I110 Hypertensive heart disease with heart failure: Secondary | ICD-10-CM | POA: Diagnosis not present

## 2016-05-01 DIAGNOSIS — M1990 Unspecified osteoarthritis, unspecified site: Secondary | ICD-10-CM | POA: Diagnosis not present

## 2016-05-01 DIAGNOSIS — G20 Parkinson's disease: Secondary | ICD-10-CM | POA: Diagnosis not present

## 2016-05-02 DIAGNOSIS — J189 Pneumonia, unspecified organism: Secondary | ICD-10-CM | POA: Diagnosis not present

## 2016-05-02 DIAGNOSIS — I110 Hypertensive heart disease with heart failure: Secondary | ICD-10-CM | POA: Diagnosis not present

## 2016-05-02 DIAGNOSIS — G20 Parkinson's disease: Secondary | ICD-10-CM | POA: Diagnosis not present

## 2016-05-02 DIAGNOSIS — M1990 Unspecified osteoarthritis, unspecified site: Secondary | ICD-10-CM | POA: Diagnosis not present

## 2016-05-02 DIAGNOSIS — I5033 Acute on chronic diastolic (congestive) heart failure: Secondary | ICD-10-CM | POA: Diagnosis not present

## 2016-05-02 DIAGNOSIS — L89610 Pressure ulcer of right heel, unstageable: Secondary | ICD-10-CM | POA: Diagnosis not present

## 2016-05-03 DIAGNOSIS — L89610 Pressure ulcer of right heel, unstageable: Secondary | ICD-10-CM | POA: Diagnosis not present

## 2016-05-03 DIAGNOSIS — G20 Parkinson's disease: Secondary | ICD-10-CM | POA: Diagnosis not present

## 2016-05-03 DIAGNOSIS — J189 Pneumonia, unspecified organism: Secondary | ICD-10-CM | POA: Diagnosis not present

## 2016-05-03 DIAGNOSIS — I110 Hypertensive heart disease with heart failure: Secondary | ICD-10-CM | POA: Diagnosis not present

## 2016-05-03 DIAGNOSIS — I5033 Acute on chronic diastolic (congestive) heart failure: Secondary | ICD-10-CM | POA: Diagnosis not present

## 2016-05-03 DIAGNOSIS — M1990 Unspecified osteoarthritis, unspecified site: Secondary | ICD-10-CM | POA: Diagnosis not present

## 2016-05-08 DIAGNOSIS — R197 Diarrhea, unspecified: Secondary | ICD-10-CM | POA: Diagnosis not present

## 2016-05-08 DIAGNOSIS — Z9981 Dependence on supplemental oxygen: Secondary | ICD-10-CM | POA: Diagnosis not present

## 2016-05-08 DIAGNOSIS — Z79899 Other long term (current) drug therapy: Secondary | ICD-10-CM | POA: Diagnosis not present

## 2016-05-08 DIAGNOSIS — I10 Essential (primary) hypertension: Secondary | ICD-10-CM | POA: Diagnosis not present

## 2016-05-08 DIAGNOSIS — L989 Disorder of the skin and subcutaneous tissue, unspecified: Secondary | ICD-10-CM | POA: Diagnosis not present

## 2016-05-11 DIAGNOSIS — I5033 Acute on chronic diastolic (congestive) heart failure: Secondary | ICD-10-CM | POA: Diagnosis not present

## 2016-05-11 DIAGNOSIS — M1990 Unspecified osteoarthritis, unspecified site: Secondary | ICD-10-CM | POA: Diagnosis not present

## 2016-05-11 DIAGNOSIS — I110 Hypertensive heart disease with heart failure: Secondary | ICD-10-CM | POA: Diagnosis not present

## 2016-05-11 DIAGNOSIS — G20 Parkinson's disease: Secondary | ICD-10-CM | POA: Diagnosis not present

## 2016-05-11 DIAGNOSIS — J189 Pneumonia, unspecified organism: Secondary | ICD-10-CM | POA: Diagnosis not present

## 2016-05-11 DIAGNOSIS — L89610 Pressure ulcer of right heel, unstageable: Secondary | ICD-10-CM | POA: Diagnosis not present

## 2016-05-14 DIAGNOSIS — I5033 Acute on chronic diastolic (congestive) heart failure: Secondary | ICD-10-CM | POA: Diagnosis not present

## 2016-05-14 DIAGNOSIS — G20 Parkinson's disease: Secondary | ICD-10-CM | POA: Diagnosis not present

## 2016-05-14 DIAGNOSIS — M1990 Unspecified osteoarthritis, unspecified site: Secondary | ICD-10-CM | POA: Diagnosis not present

## 2016-05-14 DIAGNOSIS — L89610 Pressure ulcer of right heel, unstageable: Secondary | ICD-10-CM | POA: Diagnosis not present

## 2016-05-14 DIAGNOSIS — J189 Pneumonia, unspecified organism: Secondary | ICD-10-CM | POA: Diagnosis not present

## 2016-05-14 DIAGNOSIS — I110 Hypertensive heart disease with heart failure: Secondary | ICD-10-CM | POA: Diagnosis not present

## 2016-05-15 DIAGNOSIS — I10 Essential (primary) hypertension: Secondary | ICD-10-CM | POA: Diagnosis not present

## 2016-05-15 DIAGNOSIS — E639 Nutritional deficiency, unspecified: Secondary | ICD-10-CM | POA: Diagnosis not present

## 2016-05-15 DIAGNOSIS — Z993 Dependence on wheelchair: Secondary | ICD-10-CM | POA: Diagnosis not present

## 2016-05-15 DIAGNOSIS — Z79899 Other long term (current) drug therapy: Secondary | ICD-10-CM | POA: Diagnosis not present

## 2016-05-15 DIAGNOSIS — R32 Unspecified urinary incontinence: Secondary | ICD-10-CM | POA: Diagnosis not present

## 2016-05-15 DIAGNOSIS — G4089 Other seizures: Secondary | ICD-10-CM | POA: Diagnosis not present

## 2016-05-15 DIAGNOSIS — R197 Diarrhea, unspecified: Secondary | ICD-10-CM | POA: Diagnosis not present

## 2016-05-21 DIAGNOSIS — I5033 Acute on chronic diastolic (congestive) heart failure: Secondary | ICD-10-CM | POA: Diagnosis not present

## 2016-05-21 DIAGNOSIS — L89610 Pressure ulcer of right heel, unstageable: Secondary | ICD-10-CM | POA: Diagnosis not present

## 2016-05-21 DIAGNOSIS — I110 Hypertensive heart disease with heart failure: Secondary | ICD-10-CM | POA: Diagnosis not present

## 2016-05-21 DIAGNOSIS — G20 Parkinson's disease: Secondary | ICD-10-CM | POA: Diagnosis not present

## 2016-05-21 DIAGNOSIS — J189 Pneumonia, unspecified organism: Secondary | ICD-10-CM | POA: Diagnosis not present

## 2016-05-21 DIAGNOSIS — M1990 Unspecified osteoarthritis, unspecified site: Secondary | ICD-10-CM | POA: Diagnosis not present

## 2016-05-23 DIAGNOSIS — I5033 Acute on chronic diastolic (congestive) heart failure: Secondary | ICD-10-CM | POA: Diagnosis not present

## 2016-05-23 DIAGNOSIS — K219 Gastro-esophageal reflux disease without esophagitis: Secondary | ICD-10-CM | POA: Diagnosis not present

## 2016-05-23 DIAGNOSIS — G20 Parkinson's disease: Secondary | ICD-10-CM | POA: Diagnosis not present

## 2016-05-23 DIAGNOSIS — G40909 Epilepsy, unspecified, not intractable, without status epilepticus: Secondary | ICD-10-CM | POA: Diagnosis not present

## 2016-05-23 DIAGNOSIS — I110 Hypertensive heart disease with heart failure: Secondary | ICD-10-CM | POA: Diagnosis not present

## 2016-05-23 DIAGNOSIS — M1991 Primary osteoarthritis, unspecified site: Secondary | ICD-10-CM | POA: Diagnosis not present

## 2016-05-28 DIAGNOSIS — I110 Hypertensive heart disease with heart failure: Secondary | ICD-10-CM | POA: Diagnosis not present

## 2016-05-28 DIAGNOSIS — G40909 Epilepsy, unspecified, not intractable, without status epilepticus: Secondary | ICD-10-CM | POA: Diagnosis not present

## 2016-05-28 DIAGNOSIS — M1991 Primary osteoarthritis, unspecified site: Secondary | ICD-10-CM | POA: Diagnosis not present

## 2016-05-28 DIAGNOSIS — K219 Gastro-esophageal reflux disease without esophagitis: Secondary | ICD-10-CM | POA: Diagnosis not present

## 2016-05-28 DIAGNOSIS — I5033 Acute on chronic diastolic (congestive) heart failure: Secondary | ICD-10-CM | POA: Diagnosis not present

## 2016-05-28 DIAGNOSIS — G20 Parkinson's disease: Secondary | ICD-10-CM | POA: Diagnosis not present

## 2016-06-05 ENCOUNTER — Ambulatory Visit: Payer: Medicare Other | Admitting: Adult Health

## 2016-06-06 DIAGNOSIS — I110 Hypertensive heart disease with heart failure: Secondary | ICD-10-CM | POA: Diagnosis not present

## 2016-06-06 DIAGNOSIS — K219 Gastro-esophageal reflux disease without esophagitis: Secondary | ICD-10-CM | POA: Diagnosis not present

## 2016-06-06 DIAGNOSIS — G40909 Epilepsy, unspecified, not intractable, without status epilepticus: Secondary | ICD-10-CM | POA: Diagnosis not present

## 2016-06-06 DIAGNOSIS — I5033 Acute on chronic diastolic (congestive) heart failure: Secondary | ICD-10-CM | POA: Diagnosis not present

## 2016-06-06 DIAGNOSIS — G20 Parkinson's disease: Secondary | ICD-10-CM | POA: Diagnosis not present

## 2016-06-06 DIAGNOSIS — M1991 Primary osteoarthritis, unspecified site: Secondary | ICD-10-CM | POA: Diagnosis not present

## 2016-06-12 ENCOUNTER — Ambulatory Visit (INDEPENDENT_AMBULATORY_CARE_PROVIDER_SITE_OTHER): Payer: Medicare Other | Admitting: Adult Health

## 2016-06-12 ENCOUNTER — Encounter: Payer: Self-pay | Admitting: Adult Health

## 2016-06-12 VITALS — BP 157/81 | HR 85 | Temp 98.1°F | Ht 73.0 in | Wt 202.0 lb

## 2016-06-12 DIAGNOSIS — G20 Parkinson's disease: Secondary | ICD-10-CM | POA: Diagnosis not present

## 2016-06-12 DIAGNOSIS — R569 Unspecified convulsions: Secondary | ICD-10-CM | POA: Diagnosis not present

## 2016-06-12 DIAGNOSIS — Z5181 Encounter for therapeutic drug level monitoring: Secondary | ICD-10-CM

## 2016-06-12 NOTE — Progress Notes (Signed)
PATIENT: Barry Dean DOB: 1924-03-03  REASON FOR VISIT: follow up-seizures, Parkinson's disease HISTORY FROM: patient  HISTORY OF PRESENT ILLNESS: Barry Dean is a 82 year old male with a history of Parkinson's disease and seizures. He returns today for follow-up. The patient is currently taking Vimpat 50 mg in morning and 150 mg in the evening. He is on Lamictal 50 mg twice a day. His daughter is with him reports that he still has the "vocalizations" that she has been identifying as seizures. In the past it has been unclear if he is these really represent seizure events. She states that the events last for seconds-she describes it as if the patient is laughing. No other symptoms are present with these events. The patient uses a wheelchairprimarily. He will use a walker for transfers. He continues to [facility] at [facility]. He requires assistance with ADLs. Denies any new medical issues. Returns today for an evaluation.  HISTORY 01/31/16: Barry Dean is an 82 year old male with a history of gait disorder and seizures. He returns today for follow-up. The patient's daughter is with him today. She states that his seizure frequency has definitely decreased since the last visit. She notes that his last seizure was on Sunday. She describes it as a laughing episode tjat lasted for a few seconds. She states that she is not with him every day and the facility has not made her aware of any additional seizures. She reports that his sleepiness has improved since stopping Keppra. He still has days where he sleeps throughout the day. He does require assistance with ADLs. He is [facility] at [facility]. There has been some mixup with his medication. He has remained on Lamictal 25 mg twice a day instead of titrating this medication up. He is also on Vimpat 50 mg in the morning and 150 mg in the evening. He returns today for an evaluation.    HISTORY 09/27/15: Barry Dean is an 82 year old  left-handed white male with a history of problems with a significant gait disorder with a tendency to fall backwards. The patient has episodes of laughing, then staring off felt secondary to seizures. The patient is been on Vimpat and Keppra. The patient has had significant drowsiness on this medication. The patient has had episodes of syncope that occurred during the summer 2016, the patient may have had a fall from bed that was unwitnessed on 08/22/2015. The patient has had no definite episodes of syncope that occurred since last seen. The patient is quite drowsy on the medication however, he sleeps most of the day. He has had brief episodes where he may vocalize, no significant problems with staring or unresponsiveness is noted. The patient returns to the office today for an evaluation   REVIEW OF SYSTEMS: Out of a complete 14 system review of symptoms, the patient complains only of the following symptoms, and all other reviewed systems are negative.  Incontinence of bladder, frequency of urination, seizure, behavior problem, confusion, moles, snoring, daytime sleepiness, incontinence of bowels, diarrhea, black stools, shortness of breath, drooling  ALLERGIES: Allergies  Allergen Reactions  . Topamax Other (See Comments)    Unknown reaction  . Uroxatral [Alfuzosin Hydrochloride] Other (See Comments)    Unknown reaction  . Valium Other (See Comments)    Unknown reaction    HOME MEDICATIONS: Outpatient Medications Prior to Visit  Medication Sig Dispense Refill  . acetaminophen (TYLENOL) 500 MG tablet Take 500 mg by mouth every 6 (six) hours as needed for pain. Not  to exceed 3 gms of acetaminophen in 24 hours    . amLODipine-benazepril (LOTREL) 5-20 MG capsule Take 1 capsule by mouth daily. Resume in 4 days.    Marland Kitchen aspirin 81 MG chewable tablet Chew 1 tablet (81 mg total) by mouth every evening.    Marland Kitchen dextromethorphan-guaiFENesin (MUCINEX DM) 30-600 MG 12hr tablet Take 1 tablet by mouth 2 (two)  times daily as needed for cough. 10 tablet 0  . feeding supplement, ENSURE ENLIVE, (ENSURE ENLIVE) LIQD Take 237 mLs by mouth daily. 237 mL 0  . HYDROcodone-acetaminophen (NORCO/VICODIN) 5-325 MG tablet Take 1 tablet by mouth every 8 (eight) hours as needed for moderate pain. 10 tablet 0  . ipratropium-albuterol (DUONEB) 0.5-2.5 (3) MG/3ML SOLN Take 3 mLs by nebulization every 6 (six) hours as needed. 360 mL 0  . lacosamide (VIMPAT) 50 MG TABS tablet Take 1 tablet in the AM (50 mg) and 3 tablets (150 mg ) in the PM. (Patient taking differently: Take 50-150 mg by mouth 2 (two) times daily. Take 1 tablet in the AM (50 mg) and 3 tablets (150 mg ) in the PM.) 120 tablet 4  . lamoTRIgine (LAMICTAL) 25 MG tablet Take 3 tablets (75 mg total) by mouth 2 (two) times daily.    . mineral oil external liquid Place 3 drops into both ears every 7 (seven) days.    . sennosides-docusate sodium (SENOKOT-S) 8.6-50 MG tablet Take 1 tablet by mouth at bedtime as needed for constipation.    . vitamin B-12 (CYANOCOBALAMIN) 1000 MCG tablet Take 2,000 mcg by mouth daily.    . pantoprazole (PROTONIX) 40 MG tablet Take 1 tablet (40 mg total) by mouth daily.     No facility-administered medications prior to visit.     PAST MEDICAL HISTORY: Past Medical History:  Diagnosis Date  . Abnormality of gait   . Anxiety   . Arthritis   . Atrial fibrillation ([place])   . BPH (benign prostatic hyperplasia)   . Deafness   . Dehydration   . Depression   . GERD (gastroesophageal reflux disease)   . Hypertension   . Melanoma ([place])   . Mitral regurgitation   . Reflux   . Seizure ([place])   . Seizures ([place])   . Syncope   . Tremor 09/27/2015  . Tremor, essential     PAST SURGICAL HISTORY: Past Surgical History:  Procedure Laterality Date  . APPENDECTOMY    . CATARACT EXTRACTION W/ INTRAOCULAR LENS  IMPLANT, BILATERAL    . ESOPHAGOGASTRODUODENOSCOPY (EGD) WITH PROPOFOL N/A 09/15/2014   Procedure: ESOPHAGOGASTRODUODENOSCOPY  (EGD) WITH PROPOFOL;  Surgeon: [name], MD;  Location: [facility] ENDOSCOPY;  Service: Endoscopy;  Laterality: N/A;  . unknown      FAMILY HISTORY: Family History  Problem Relation Age of Onset  . Cancer Mother     Skin cancer  . Stroke Father   . Parkinsonism Sister   . Diabetes Brother     SOCIAL HISTORY: Social History   Social History  . Marital status: Unknown    Spouse name: N/A  . Number of children: N/A  . Years of education: N/A   Occupational History  . Not on file.   Social History Main Topics  . Smoking status: Never Smoker  . Smokeless tobacco: Never Used  . Alcohol use No  . Drug use: No  . Sexual activity: Not on file   Other Topics Concern  . Not on file   Social History Narrative   ** Merged  History Encounter **      Patient drinks about 2 cups of caffeine daily.   Patient is left handed.          PHYSICAL EXAM  Vitals:   06/12/16 1403  BP: (!) 157/81  Pulse: 85  Temp: 98.1 F (36.7 C)  TempSrc: Oral  Weight: 202 lb (91.6 kg)  Height: 6\' 1"  (1.854 m)   Body mass index is 26.65 kg/m.  Generalized: Well developed, in no acute distress   Neurological examination  Mentation: Alert oriented to time, place, history taking. Follows all commands speech and language fluent. Slight masking of the face. Cranial nerve II-XII: Pupils were equal round reactive to light. Extraocular movements were full, visual field were full on confrontational test. Facial sensation and strength were normal. Uvula tongue midline. Head turning and shoulder shrug  were normal and symmetric. Motor: The motor testing reveals 5 over 5 strength of all 4 extremities. Good symmetric motor tone is noted throughout. Resting tremor noted in hands right greater than left. Sensory: Sensory testing is intact to soft touch on all 4 extremities. No evidence of extinction is noted.  Coordination: Cerebellar testing reveals good finger-nose-finger difficulty with heel to shin  bilaterally Gait and station: Patient is in a wheelchair. Reflexes: Deep tendon reflexes are symmetric and normal bilaterally.   DIAGNOSTIC DATA (LABS, IMAGING, TESTING) - I reviewed patient records, labs, notes, testing and imaging myself where available.  Lab Results  Component Value Date   WBC 8.4 03/22/2016   HGB 10.3 (L) 03/22/2016   HCT 31.2 (L) 03/22/2016   MCV 87.9 03/22/2016   PLT 400 03/22/2016      Component Value Date/Time   NA 132 (L) 03/23/2016 0520   NA 133 (L) 01/31/2016 1044   K 4.2 03/23/2016 0520   CL 96 (L) 03/23/2016 0520   CO2 27 03/23/2016 0520   GLUCOSE 98 03/23/2016 0520   BUN 21 (H) 03/23/2016 0520   BUN 12 01/31/2016 1044   CREATININE 0.88 03/23/2016 0520   CALCIUM 8.7 (L) 03/23/2016 0520   PROT 5.2 (L) 03/17/2016 0210   PROT 6.6 01/31/2016 1044   ALBUMIN 2.5 (L) 03/17/2016 0210   ALBUMIN 4.2 01/31/2016 1044   AST 51 (H) 03/17/2016 0210   ALT 59 03/17/2016 0210   ALKPHOS 53 03/17/2016 0210   BILITOT 0.7 03/17/2016 0210   BILITOT 0.3 01/31/2016 1044   GFRNONAA >60 03/23/2016 0520   GFRAA >60 03/23/2016 0520       ASSESSMENT AND PLAN 82 y.o. year old male  has a past medical history of Abnormality of gait; Anxiety; Arthritis; Atrial fibrillation ([place]); BPH (benign prostatic hyperplasia); Deafness; Dehydration; Depression; GERD (gastroesophageal reflux disease); Hypertension; Melanoma ([place]); Mitral regurgitation; Reflux; Seizure ([place]); Seizures ([place]); Syncope; Tremor (09/27/2015); and Tremor, essential. here with :  1. Seizures 2. Parkinson's disease  Overall the patient has remained stable. He will continue on Vimpat and Lamictal. I will check blood work today. Patient and his daughter advised that if his symptoms worsen or he develops any new symptoms he should let us know. Follow-up in 6 months with Dr. [name].     Ward Givens, MSN, NP-C 06/12/2016, 2:14 PM [facility] [address] [place], [place]  [address] [phone]

## 2016-06-12 NOTE — Patient Instructions (Signed)
Continue Vimpat and Lamictal Blood work today If your symptoms worsen or you develop new symptoms please let us know.

## 2016-06-12 NOTE — Progress Notes (Signed)
I have read the note, and I agree with the clinical assessment and plan.  Tabitha Tupper A. Piper Hassebrock, MD, PhD Certified in Neurology, Clinical Neurophysiology, Sleep Medicine, Pain Medicine and Neuroimaging  [facility] [address], [address] [place], [place] [address] [phone]  

## 2016-06-13 DIAGNOSIS — K219 Gastro-esophageal reflux disease without esophagitis: Secondary | ICD-10-CM | POA: Diagnosis not present

## 2016-06-13 DIAGNOSIS — M1991 Primary osteoarthritis, unspecified site: Secondary | ICD-10-CM | POA: Diagnosis not present

## 2016-06-13 DIAGNOSIS — G40909 Epilepsy, unspecified, not intractable, without status epilepticus: Secondary | ICD-10-CM | POA: Diagnosis not present

## 2016-06-13 DIAGNOSIS — I110 Hypertensive heart disease with heart failure: Secondary | ICD-10-CM | POA: Diagnosis not present

## 2016-06-13 DIAGNOSIS — I5033 Acute on chronic diastolic (congestive) heart failure: Secondary | ICD-10-CM | POA: Diagnosis not present

## 2016-06-13 DIAGNOSIS — G20 Parkinson's disease: Secondary | ICD-10-CM | POA: Diagnosis not present

## 2016-06-14 ENCOUNTER — Telehealth: Payer: Self-pay | Admitting: *Deleted

## 2016-06-14 LAB — COMPREHENSIVE METABOLIC PANEL WITH GFR
ALT: 13 IU/L (ref 0–44)
AST: 21 IU/L (ref 0–40)
Albumin/Globulin Ratio: 1.6 (ref 1.2–2.2)
Albumin: 4.1 g/dL (ref 3.2–4.6)
Alkaline Phosphatase: 79 IU/L (ref 39–117)
BUN/Creatinine Ratio: 16 (ref 10–24)
BUN: 14 mg/dL (ref 10–36)
Bilirubin Total: <0.2 mg/dL (ref 0.0–1.2)
CO2: 24 mmol/L (ref 18–29)
Calcium: 9.4 mg/dL (ref 8.6–10.2)
Chloride: 98 mmol/L (ref 96–106)
Creatinine, Ser: 0.87 mg/dL (ref 0.76–1.27)
GFR calc Af Amer: 88 mL/min/1.73
GFR calc non Af Amer: 76 mL/min/1.73
Globulin, Total: 2.5 g/dL (ref 1.5–4.5)
Glucose: 110 mg/dL — ABNORMAL HIGH (ref 65–99)
Potassium: 4.8 mmol/L (ref 3.5–5.2)
Sodium: 136 mmol/L (ref 134–144)
Total Protein: 6.6 g/dL (ref 6.0–8.5)

## 2016-06-14 LAB — CBC WITH DIFFERENTIAL/PLATELET
BASOS: 0 %
Basophils Absolute: 0 10*3/uL (ref 0.0–0.2)
EOS (ABSOLUTE): 0.2 10*3/uL (ref 0.0–0.4)
EOS: 3 %
HEMATOCRIT: 28.5 % — AB (ref 37.5–51.0)
HEMOGLOBIN: 9.3 g/dL — AB (ref 12.6–17.7)
IMMATURE GRANS (ABS): 0 10*3/uL (ref 0.0–0.1)
Immature Granulocytes: 0 %
LYMPHS: 34 %
Lymphocytes Absolute: 2.1 10*3/uL (ref 0.7–3.1)
MCH: 26 pg — ABNORMAL LOW (ref 26.6–33.0)
MCHC: 32.6 g/dL (ref 31.5–35.7)
MCV: 80 fL (ref 79–97)
MONOCYTES: 13 %
Monocytes Absolute: 0.8 10*3/uL (ref 0.1–0.9)
NEUTROS PCT: 50 %
Neutrophils Absolute: 3.2 10*3/uL (ref 1.4–7.0)
Platelets: 387 10*3/uL — ABNORMAL HIGH (ref 150–379)
RBC: 3.58 x10E6/uL — ABNORMAL LOW (ref 4.14–5.80)
RDW: 16.6 % — ABNORMAL HIGH (ref 12.3–15.4)
WBC: 6.3 10*3/uL (ref 3.4–10.8)

## 2016-06-14 LAB — LAMOTRIGINE LEVEL: Lamotrigine Lvl: 2.3 ug/mL (ref 2.0–20.0)

## 2016-06-14 NOTE — Telephone Encounter (Signed)
Per Edman Circle, NP spoke with daughter and informed her that patient's Lab work is ok. Results are consistent with previous lab work.  She verbalized understanding, appreciation.

## 2016-06-22 DIAGNOSIS — G20 Parkinson's disease: Secondary | ICD-10-CM | POA: Diagnosis not present

## 2016-06-22 DIAGNOSIS — I110 Hypertensive heart disease with heart failure: Secondary | ICD-10-CM | POA: Diagnosis not present

## 2016-06-22 DIAGNOSIS — K219 Gastro-esophageal reflux disease without esophagitis: Secondary | ICD-10-CM | POA: Diagnosis not present

## 2016-06-22 DIAGNOSIS — I5033 Acute on chronic diastolic (congestive) heart failure: Secondary | ICD-10-CM | POA: Diagnosis not present

## 2016-06-22 DIAGNOSIS — M1991 Primary osteoarthritis, unspecified site: Secondary | ICD-10-CM | POA: Diagnosis not present

## 2016-06-22 DIAGNOSIS — G40909 Epilepsy, unspecified, not intractable, without status epilepticus: Secondary | ICD-10-CM | POA: Diagnosis not present

## 2016-06-27 DIAGNOSIS — G20 Parkinson's disease: Secondary | ICD-10-CM | POA: Diagnosis not present

## 2016-06-27 DIAGNOSIS — K219 Gastro-esophageal reflux disease without esophagitis: Secondary | ICD-10-CM | POA: Diagnosis not present

## 2016-06-27 DIAGNOSIS — M1991 Primary osteoarthritis, unspecified site: Secondary | ICD-10-CM | POA: Diagnosis not present

## 2016-06-27 DIAGNOSIS — I110 Hypertensive heart disease with heart failure: Secondary | ICD-10-CM | POA: Diagnosis not present

## 2016-06-27 DIAGNOSIS — G40909 Epilepsy, unspecified, not intractable, without status epilepticus: Secondary | ICD-10-CM | POA: Diagnosis not present

## 2016-06-27 DIAGNOSIS — I5033 Acute on chronic diastolic (congestive) heart failure: Secondary | ICD-10-CM | POA: Diagnosis not present

## 2016-07-03 DIAGNOSIS — E559 Vitamin D deficiency, unspecified: Secondary | ICD-10-CM | POA: Diagnosis not present

## 2016-07-03 DIAGNOSIS — E039 Hypothyroidism, unspecified: Secondary | ICD-10-CM | POA: Diagnosis not present

## 2016-07-03 DIAGNOSIS — E119 Type 2 diabetes mellitus without complications: Secondary | ICD-10-CM | POA: Diagnosis not present

## 2016-07-03 DIAGNOSIS — Z79899 Other long term (current) drug therapy: Secondary | ICD-10-CM | POA: Diagnosis not present

## 2016-07-03 DIAGNOSIS — E782 Mixed hyperlipidemia: Secondary | ICD-10-CM | POA: Diagnosis not present

## 2016-07-07 DIAGNOSIS — R42 Dizziness and giddiness: Secondary | ICD-10-CM | POA: Diagnosis not present

## 2016-07-07 DIAGNOSIS — R404 Transient alteration of awareness: Secondary | ICD-10-CM | POA: Diagnosis not present

## 2016-07-08 ENCOUNTER — Emergency Department (HOSPITAL_COMMUNITY): Payer: Medicare Other

## 2016-07-08 ENCOUNTER — Emergency Department (HOSPITAL_COMMUNITY)
Admission: EM | Admit: 2016-07-08 | Discharge: 2016-07-08 | Disposition: A | Discharge status: Home / Self Care | Payer: Medicare Other | Attending: Emergency Medicine | Admitting: Emergency Medicine

## 2016-07-08 ENCOUNTER — Encounter (HOSPITAL_COMMUNITY): Payer: Self-pay | Admitting: Emergency Medicine

## 2016-07-08 DIAGNOSIS — I5033 Acute on chronic diastolic (congestive) heart failure: Secondary | ICD-10-CM | POA: Diagnosis not present

## 2016-07-08 DIAGNOSIS — Y999 Unspecified external cause status: Secondary | ICD-10-CM | POA: Diagnosis not present

## 2016-07-08 DIAGNOSIS — Y92009 Unspecified place in unspecified non-institutional (private) residence as the place of occurrence of the external cause: Secondary | ICD-10-CM | POA: Insufficient documentation

## 2016-07-08 DIAGNOSIS — W1839XA Other fall on same level, initial encounter: Secondary | ICD-10-CM | POA: Diagnosis not present

## 2016-07-08 DIAGNOSIS — S0101XA Laceration without foreign body of scalp, initial encounter: Secondary | ICD-10-CM | POA: Diagnosis not present

## 2016-07-08 DIAGNOSIS — G20 Parkinson's disease: Secondary | ICD-10-CM | POA: Diagnosis not present

## 2016-07-08 DIAGNOSIS — I110 Hypertensive heart disease with heart failure: Secondary | ICD-10-CM | POA: Diagnosis not present

## 2016-07-08 DIAGNOSIS — S0001XA Abrasion of scalp, initial encounter: Secondary | ICD-10-CM | POA: Diagnosis not present

## 2016-07-08 DIAGNOSIS — Y939 Activity, unspecified: Secondary | ICD-10-CM | POA: Diagnosis not present

## 2016-07-08 DIAGNOSIS — W19XXXA Unspecified fall, initial encounter: Secondary | ICD-10-CM

## 2016-07-08 DIAGNOSIS — S0990XA Unspecified injury of head, initial encounter: Secondary | ICD-10-CM | POA: Diagnosis present

## 2016-07-08 DIAGNOSIS — R42 Dizziness and giddiness: Secondary | ICD-10-CM | POA: Diagnosis not present

## 2016-07-08 DIAGNOSIS — Z7982 Long term (current) use of aspirin: Secondary | ICD-10-CM | POA: Insufficient documentation

## 2016-07-08 DIAGNOSIS — S199XXA Unspecified injury of neck, initial encounter: Secondary | ICD-10-CM | POA: Diagnosis not present

## 2016-07-08 NOTE — ED Notes (Signed)
Wound care to small laceration on top of head.

## 2016-07-08 NOTE — ED Notes (Signed)
CALLED [facility] PER RN CALLIE TO TRANSPORT PT TO [facility] [place]/NO SPECIAL EQUIPMENT--Barry Dean

## 2016-07-08 NOTE — ED Provider Notes (Signed)
[facility] DEPT Provider Note   CSN: [id number] Arrival date & time: 07/08/16  0033   By signing my name below, I, Camillo Flaming, attest that this documentation has been prepared under the direction and in the presence of Delora Fuel, MD. Electronically Signed: Camillo Flaming, Scribe. 07/08/16. 1:03 AM.   History   Chief Complaint No chief complaint on file.   The history is provided by the patient. No language interpreter was used.    HPI Comments: Barry Dean is a 82 y.o. male with a PMHx of arthritis who presents to the Emergency Department by EMS for fall PTA at his living community. He reports trying to get up on own volition and fell on floor. Per EMS, pt stated that he did become dizzy prior to falling. He denies any pain, LOC, trauma with fall. Pt has hearing aid in both ears.   Past Medical History:  Diagnosis Date  . Abnormality of gait   . Anxiety   . Arthritis   . Atrial fibrillation ([place])   . BPH (benign prostatic hyperplasia)   . Deafness   . Dehydration   . Depression   . GERD (gastroesophageal reflux disease)   . Hypertension   . Melanoma ([place])   . Mitral regurgitation   . Reflux   . Seizure ([place])   . Seizures ([place])   . Syncope   . Tremor 09/27/2015  . Tremor, essential     Patient Active Problem List   Diagnosis Date Noted  . Acute on chronic diastolic heart failure ([place]) 03/22/2016  . Sepsis, unspecified organism ([place])   . Hypokalemia   . Hypomagnesemia   . Sepsis due to pneumonia ([place])   . Sepsis ([place]) 03/12/2016  . Nausea & vomiting 03/12/2016  . Diarrhea 03/12/2016  . Acute encephalopathy 03/12/2016  . Acute kidney injury ([place]) 03/12/2016  . Acute respiratory failure with hypoxia ([place]) 03/12/2016  . Septic shock ([place])   . Abnormality of gait 09/27/2015  . Tremor 09/27/2015  . Undiagnosed cardiac murmurs 03/06/2015  . Syncope and collapse 03/05/2015  . Parkinson's disease ([place]) 03/05/2015  . PAF (paroxysmal atrial fibrillation) ([place])  03/05/2015  . Bradycardia 03/05/2015  . Deafness 03/05/2015  . Mild mitral regurgitation 03/05/2015  . Emesis, persistent 09/14/2014  . Benign essential HTN 09/14/2014  . Syncope 09/13/2013  . ?? Respiratory failure 09/13/2013  . Noninfectious gastroenteritis and colitis 11/12/2012  . Acute and chronic respiratory failure 11/12/2012  . Community acquired pneumonia 11/12/2012  . HCAP (healthcare-associated pneumonia) 07/18/2011  . Seizure disorder ([place]) 07/18/2011  . HTN (hypertension) 07/18/2011  . Hyponatremia 07/18/2011    Past Surgical History:  Procedure Laterality Date  . APPENDECTOMY    . CATARACT EXTRACTION W/ INTRAOCULAR LENS  IMPLANT, BILATERAL    . ESOPHAGOGASTRODUODENOSCOPY (EGD) WITH PROPOFOL N/A 09/15/2014   Procedure: ESOPHAGOGASTRODUODENOSCOPY (EGD) WITH PROPOFOL;  Surgeon: [name], MD;  Location: [name] ENDOSCOPY;  Service: Endoscopy;  Laterality: N/A;  . unknown         Home Medications    Prior to Admission medications   Medication Sig Start Date End Date Taking? Authorizing Provider  acetaminophen (TYLENOL) 500 MG tablet Take 500 mg by mouth every 6 (six) hours as needed for pain. Not to exceed 3 gms of acetaminophen in 24 hours    Historical Provider, MD  amLODipine-benazepril (LOTREL) 5-20 MG capsule Take 1 capsule by mouth daily. Resume in 4 days. 03/26/16   Eugenie Filler, MD  aspirin 81 MG chewable tablet Sarina Ser  1 tablet (81 mg total) by mouth every evening. 09/22/14   Thurnell Lose, MD  cholestyramine (QUESTRAN) 4 g packet Take 4 g by mouth 2 (two) times daily.    Historical Provider, MD  dextromethorphan-guaiFENesin (MUCINEX DM) 30-600 MG 12hr tablet Take 1 tablet by mouth 2 (two) times daily as needed for cough. Patient not taking: Reported on 06/12/2016 03/22/16   Eugenie Filler, MD  feeding supplement, ENSURE ENLIVE, (ENSURE ENLIVE) LIQD Take 237 mLs by mouth daily. 03/22/16   Eugenie Filler, MD  HYDROcodone-acetaminophen (NORCO/VICODIN)  5-325 MG tablet Take 1 tablet by mouth every 8 (eight) hours as needed for moderate pain. Patient not taking: Reported on 06/12/2016 03/22/16   Eugenie Filler, MD  ipratropium-albuterol (DUONEB) 0.5-2.5 (3) MG/3ML SOLN Take 3 mLs by nebulization every 6 (six) hours as needed. 03/22/16   Eugenie Filler, MD  ketoconazole (NIZORAL) 2 % cream Apply 1 application topically daily. Apply to inner thigh and groin topically two times a day for yeast. Apply to inner thigh, groin and bottock BID until resolved    Historical Provider, MD  lacosamide (VIMPAT) 50 MG TABS tablet Take 1 tablet in the AM (50 mg) and 3 tablets (150 mg ) in the PM. Patient taking differently: Take 50-150 mg by mouth 2 (two) times daily. Take 1 tablet in the AM (50 mg) and 3 tablets (150 mg ) in the PM. 01/31/16   Ward Givens, NP  lamoTRIgine (LAMICTAL) 25 MG tablet Take 3 tablets (75 mg total) by mouth 2 (two) times daily. 03/22/16   Eugenie Filler, MD  loperamide (IMODIUM) 2 MG capsule Take 2 mg by mouth every 6 (six) hours as needed for diarrhea or loose stools.    Historical Provider, MD  mineral oil external liquid Place 3 drops into both ears every 7 (seven) days.    Historical Provider, MD  pantoprazole (PROTONIX) 20 MG tablet Take 20 mg by mouth daily.    Historical Provider, MD  sennosides-docusate sodium (SENOKOT-S) 8.6-50 MG tablet Take 1 tablet by mouth at bedtime as needed for constipation.    Historical Provider, MD  vitamin B-12 (CYANOCOBALAMIN) 1000 MCG tablet Take 2,000 mcg by mouth daily.    Historical Provider, MD    Family History Family History  Problem Relation Age of Onset  . Cancer Mother     Skin cancer  . Stroke Father   . Parkinsonism Sister   . Diabetes Brother     Social History Social History  Substance Use Topics  . Smoking status: Never Smoker  . Smokeless tobacco: Never Used  . Alcohol use No     Allergies   Topamax; Uroxatral [alfuzosin hydrochloride]; and Valium   Review of  Systems Review of Systems  Constitutional: Negative for chills and fever.  Neurological: Positive for dizziness.  All other systems reviewed and are negative.    Physical Exam Updated Vital Signs BP 157/84 (BP Location: Right Arm)   Pulse 101   Temp 97.9 F (36.6 C) (Oral)   Resp 19   SpO2 99%   Physical Exam  Constitutional: He is oriented to person, place, and time. He appears well-developed and well-nourished.  HENT:  Head: Normocephalic.  Small abrasion of vertex of scalp  Eyes: EOM are normal. Pupils are equal, round, and reactive to light.  Neck: Normal range of motion. No JVD present.  Non-tenderness  Cardiovascular: Normal rate, regular rhythm and normal heart sounds.   No murmur heard. Pulmonary/Chest: Effort normal  and breath sounds normal. He has no wheezes. He has no rales. He exhibits no tenderness.  Abdominal: Soft. Bowel sounds are normal. He exhibits no distension and no mass. There is no tenderness.  Musculoskeletal: Normal range of motion. He exhibits no edema.  Lymphadenopathy:    He has no cervical adenopathy.  Neurological: He is alert and oriented to person, place, and time. No cranial nerve deficit. He exhibits normal muscle tone. Coordination normal.  Moderate to severe cogwheel rigidity  Skin: Skin is warm and dry. No rash noted.  Psychiatric: He has a normal mood and affect. His behavior is normal. Judgment and thought content normal.  Nursing note and vitals reviewed.    ED Treatments / Results  DIAGNOSTIC STUDIES: Oxygen Saturation is 97% on RA, normal by my interpretation.    COORDINATION OF CARE: 12:45 AM Discussed treatment plan with pt at bedside and pt agreed to plan.  Radiology Ct Head Wo Contrast  Result Date: 07/08/2016 CLINICAL DATA:  Fall with abrasion to the top of head. Mild confusion. Dizziness without loss of consciousness. History of syncope and Parkinson's. EXAM: CT HEAD WITHOUT CONTRAST CT CERVICAL SPINE WITHOUT CONTRAST  TECHNIQUE: Multidetector CT imaging of the head and cervical spine was performed following the standard protocol without intravenous contrast. Multiplanar CT image reconstructions of the cervical spine were also generated. COMPARISON:  CT head 03/12/2016.  CT cervical spine 01/01/2015. FINDINGS: CT HEAD FINDINGS Brain: Mild cerebral atrophy. Mild ventricular dilatation consistent with central atrophy. Low-attenuation changes in the deep white matter consistent with small vessel ischemia. No evidence of acute infarction, hemorrhage, hydrocephalus, extra-axial collection or mass lesion/mass effect. Vascular: Vascular calcifications are present. Skull: Normal. Negative for fracture or focal lesion. Sinuses/Orbits: No acute finding. Other: Congenital nonunion of the posterior arch of C1. CT CERVICAL SPINE FINDINGS Alignment: Mild convexity at the cervical thoracic junction towards the left, consistent with chronic scoliosis. Normal alignment of the cervical spine. Skull base and vertebrae: No acute fracture. No primary bone lesion or focal pathologic process. Soft tissues and spinal canal: No prevertebral fluid or swelling. No visible canal hematoma. Disc levels: Degenerative changes throughout the cervical spine with disc space narrowing and hypertrophic changes most prominent at C4-5, C5-6, C6-7, and C7-T1 levels. Degenerative changes throughout the facet joints. Congenital nonunion of the posterior arch of C1. Upper chest: Negative. Other: Vascular calcifications. IMPRESSION: No acute intracranial abnormalities. Chronic atrophy and small vessel ischemic changes. Normal alignment of the cervical spine. Diffuse degenerative change. No acute displaced fractures identified. Electronically Signed   By: Lucienne Capers M.D.   On: 07/08/2016 02:03   Ct Cervical Spine Wo Contrast  Result Date: 07/08/2016 CLINICAL DATA:  Fall with abrasion to the top of head. Mild confusion. Dizziness without loss of consciousness.  History of syncope and Parkinson's. EXAM: CT HEAD WITHOUT CONTRAST CT CERVICAL SPINE WITHOUT CONTRAST TECHNIQUE: Multidetector CT imaging of the head and cervical spine was performed following the standard protocol without intravenous contrast. Multiplanar CT image reconstructions of the cervical spine were also generated. COMPARISON:  CT head 03/12/2016.  CT cervical spine 01/01/2015. FINDINGS: CT HEAD FINDINGS Brain: Mild cerebral atrophy. Mild ventricular dilatation consistent with central atrophy. Low-attenuation changes in the deep white matter consistent with small vessel ischemia. No evidence of acute infarction, hemorrhage, hydrocephalus, extra-axial collection or mass lesion/mass effect. Vascular: Vascular calcifications are present. Skull: Normal. Negative for fracture or focal lesion. Sinuses/Orbits: No acute finding. Other: Congenital nonunion of the posterior arch of C1. CT CERVICAL SPINE FINDINGS  Alignment: Mild convexity at the cervical thoracic junction towards the left, consistent with chronic scoliosis. Normal alignment of the cervical spine. Skull base and vertebrae: No acute fracture. No primary bone lesion or focal pathologic process. Soft tissues and spinal canal: No prevertebral fluid or swelling. No visible canal hematoma. Disc levels: Degenerative changes throughout the cervical spine with disc space narrowing and hypertrophic changes most prominent at C4-5, C5-6, C6-7, and C7-T1 levels. Degenerative changes throughout the facet joints. Congenital nonunion of the posterior arch of C1. Upper chest: Negative. Other: Vascular calcifications. IMPRESSION: No acute intracranial abnormalities. Chronic atrophy and small vessel ischemic changes. Normal alignment of the cervical spine. Diffuse degenerative change. No acute displaced fractures identified. Electronically Signed   By: Lucienne Capers M.D.   On: 07/08/2016 02:03    Procedures Procedures (including critical care time)  Medications  Ordered in ED Medications - No data to display   Initial Impression / Assessment and Plan / ED Course  I have reviewed the triage vital signs and the nursing notes.  Pertinent imaging results that were available during my care of the patient were reviewed by me and considered in my medical decision making (see chart for details).  Clinical Course    Fall at home. Apparently, he has severe Parkinson's disease and is supposed to ask for assistance to get up from a chair and did not wait for assistance and fell. Minimal signs of trauma. He is sent for CT of head and cervical spine which showed no acute changes. He is discharged back to his assisted living facility.  Final Clinical Impressions(s) / ED Diagnoses   Final diagnoses:  Fall in home, initial encounter    New Prescriptions New Prescriptions   No medications on file   I personally performed the services described in this documentation, which was scribed in my presence. The recorded information has been reviewed and is accurate.       Delora Fuel, MD [id number]

## 2016-07-08 NOTE — ED Triage Notes (Addendum)
Pt. Comes to ED by [facility] from [name]. EMS reports pt. Fell on floor and has small abrasion to top of head. Pt. Was alert after the fall. Pt. Has mild confusion, but is his baseline. Pt. Felt dizzy but did not loose consciousness. No neck or back pain & cleared c-spine. Hx of syncope & parkinsons. Pt. Has hearing aids in both ears. Upon arrival to ED, pt. Is A&O x 4. BP 160/88, P 90, RR 20, SPO2 98% on 2L, CBG 132. 20 Gauge in L AC.

## 2016-07-08 NOTE — ED Notes (Signed)
Patient transported to CT 

## 2016-07-20 DIAGNOSIS — G20 Parkinson's disease: Secondary | ICD-10-CM | POA: Diagnosis not present

## 2016-07-20 DIAGNOSIS — G4089 Other seizures: Secondary | ICD-10-CM | POA: Diagnosis not present

## 2016-07-20 DIAGNOSIS — Z993 Dependence on wheelchair: Secondary | ICD-10-CM | POA: Diagnosis not present

## 2016-07-20 DIAGNOSIS — I10 Essential (primary) hypertension: Secondary | ICD-10-CM | POA: Diagnosis not present

## 2016-10-09 DIAGNOSIS — I10 Essential (primary) hypertension: Secondary | ICD-10-CM | POA: Diagnosis not present

## 2016-10-09 DIAGNOSIS — G4089 Other seizures: Secondary | ICD-10-CM | POA: Diagnosis not present

## 2016-10-09 DIAGNOSIS — E639 Nutritional deficiency, unspecified: Secondary | ICD-10-CM | POA: Diagnosis not present

## 2016-10-09 DIAGNOSIS — D649 Anemia, unspecified: Secondary | ICD-10-CM | POA: Diagnosis not present

## 2016-10-09 DIAGNOSIS — K219 Gastro-esophageal reflux disease without esophagitis: Secondary | ICD-10-CM | POA: Diagnosis not present

## 2016-10-09 DIAGNOSIS — Z993 Dependence on wheelchair: Secondary | ICD-10-CM | POA: Diagnosis not present

## 2016-10-16 DIAGNOSIS — R6889 Other general symptoms and signs: Secondary | ICD-10-CM | POA: Diagnosis not present

## 2016-10-16 DIAGNOSIS — R718 Other abnormality of red blood cells: Secondary | ICD-10-CM | POA: Diagnosis not present

## 2016-12-04 DIAGNOSIS — G20 Parkinson's disease: Secondary | ICD-10-CM | POA: Diagnosis not present

## 2016-12-04 DIAGNOSIS — R197 Diarrhea, unspecified: Secondary | ICD-10-CM | POA: Diagnosis not present

## 2016-12-04 DIAGNOSIS — Z993 Dependence on wheelchair: Secondary | ICD-10-CM | POA: Diagnosis not present

## 2016-12-04 DIAGNOSIS — I10 Essential (primary) hypertension: Secondary | ICD-10-CM | POA: Diagnosis not present

## 2016-12-04 DIAGNOSIS — G4089 Other seizures: Secondary | ICD-10-CM | POA: Diagnosis not present

## 2016-12-04 DIAGNOSIS — K219 Gastro-esophageal reflux disease without esophagitis: Secondary | ICD-10-CM | POA: Diagnosis not present

## 2016-12-04 DIAGNOSIS — J449 Chronic obstructive pulmonary disease, unspecified: Secondary | ICD-10-CM | POA: Diagnosis not present

## 2016-12-04 DIAGNOSIS — Z9981 Dependence on supplemental oxygen: Secondary | ICD-10-CM | POA: Diagnosis not present

## 2016-12-11 DIAGNOSIS — I10 Essential (primary) hypertension: Secondary | ICD-10-CM | POA: Diagnosis not present

## 2016-12-11 DIAGNOSIS — R269 Unspecified abnormalities of gait and mobility: Secondary | ICD-10-CM | POA: Diagnosis not present

## 2016-12-11 DIAGNOSIS — G20 Parkinson's disease: Secondary | ICD-10-CM | POA: Diagnosis not present

## 2016-12-11 DIAGNOSIS — G4700 Insomnia, unspecified: Secondary | ICD-10-CM | POA: Diagnosis not present

## 2016-12-11 DIAGNOSIS — G4089 Other seizures: Secondary | ICD-10-CM | POA: Diagnosis not present

## 2016-12-13 ENCOUNTER — Encounter (INDEPENDENT_AMBULATORY_CARE_PROVIDER_SITE_OTHER): Payer: Self-pay

## 2016-12-13 ENCOUNTER — Encounter: Payer: Self-pay | Admitting: Neurology

## 2016-12-13 ENCOUNTER — Ambulatory Visit (INDEPENDENT_AMBULATORY_CARE_PROVIDER_SITE_OTHER): Payer: Medicare Other | Admitting: Neurology

## 2016-12-13 VITALS — BP 107/54 | HR 94 | Ht 73.0 in | Wt 194.5 lb

## 2016-12-13 DIAGNOSIS — G40909 Epilepsy, unspecified, not intractable, without status epilepticus: Secondary | ICD-10-CM

## 2016-12-13 MED ORDER — LAMOTRIGINE 100 MG PO TABS: 100.0000 mg | ORAL_TABLET | Freq: Two times a day (BID) | ORAL | 5 refills | Status: AC

## 2016-12-13 NOTE — Patient Instructions (Signed)
   We will go up on the Lamictal to 100 mg twice a day.

## 2016-12-13 NOTE — Progress Notes (Signed)
Reason for visit: Seizures  Barry Dean is an 83 y.o. male  History of present illness:  Barry Dean is a 83 year old left-handed white male with a history of gelastic seizures. The patient continues to have some seizure-type events once or twice a week, the episodes are very brief and do not result in any injury to the patient. The patient will laugh briefly for a few seconds, then the event will be over. The patient is on Vimpat and Lamictal. He seems to be tolerating his medications well. The patient does have tremors of both arms, both resting and intentional, and he has problems with drooling. The patient does have a gait disorder. He will be getting physical and occupational therapy in the near future. He is on a mechanically soft diet, he denies any significant issues with swallowing. He did fall about 2 weeks ago without injury. He has a tendency to lean backwards, he requires assistance with walking.  Past Medical History:  Diagnosis Date  . Abnormality of gait   . Anxiety   . Arthritis   . Atrial fibrillation ([place])   . BPH (benign prostatic hyperplasia)   . Deafness   . Dehydration   . Depression   . GERD (gastroesophageal reflux disease)   . Hypertension   . Melanoma ([place])   . Mitral regurgitation   . Reflux   . Seizure ([place])   . Seizures ([place])   . Syncope   . Tremor 09/27/2015  . Tremor, essential     Past Surgical History:  Procedure Laterality Date  . APPENDECTOMY    . CATARACT EXTRACTION W/ INTRAOCULAR LENS  IMPLANT, BILATERAL    . ESOPHAGOGASTRODUODENOSCOPY (EGD) WITH PROPOFOL N/A 09/15/2014   Procedure: ESOPHAGOGASTRODUODENOSCOPY (EGD) WITH PROPOFOL;  Surgeon: [name], MD;  Location: [facility] ENDOSCOPY;  Service: Endoscopy;  Laterality: N/A;  . unknown      Family History  Problem Relation Age of Onset  . Cancer Mother     Skin cancer  . Stroke Father   . Parkinsonism Sister   . Diabetes Brother     Social history:  reports that he has never  smoked. He has never used smokeless tobacco. He reports that he does not drink alcohol or use drugs.    Allergies  Allergen Reactions  . Topamax Other (See Comments)    Unknown reaction  . Uroxatral [Alfuzosin Hydrochloride] Other (See Comments)    Unknown reaction  . Valium Other (See Comments)    Unknown reaction    Medications:  Prior to Admission medications   Medication Sig Start Date End Date Taking? Authorizing Provider  amLODipine-benazepril (LOTREL) 5-20 MG capsule Take 1 capsule by mouth daily. Resume in 4 days. 03/26/16  Yes Eugenie Filler, MD  aspirin 81 MG chewable tablet Chew 1 tablet (81 mg total) by mouth every evening. 09/22/14  Yes Thurnell Lose, MD  cholestyramine (QUESTRAN) 4 g packet Take 4 g by mouth 2 (two) times daily.   Yes Historical Provider, MD  feeding supplement, ENSURE ENLIVE, (ENSURE ENLIVE) LIQD Take 237 mLs by mouth daily. 03/22/16  Yes Eugenie Filler, MD  ipratropium-albuterol (DUONEB) 0.5-2.5 (3) MG/3ML SOLN Take 3 mLs by nebulization every 6 (six) hours as needed. Patient taking differently: Take 3 mLs by nebulization every 6 (six) hours as needed (shortness of breath).  03/22/16  Yes Eugenie Filler, MD  lacosamide (VIMPAT) 50 MG TABS tablet Take 1 tablet in the AM (50 mg) and 3 tablets (150  mg ) in the PM. Patient taking differently: Take 50-150 mg by mouth 2 (two) times daily. Take 1 tablet in the AM (50 mg) and 3 tablets (150 mg ) in the PM. 01/31/16  Yes Ward Givens, NP  lamoTRIgine (LAMICTAL) 25 MG tablet Take 3 tablets (75 mg total) by mouth 2 (two) times daily. 03/22/16  Yes Eugenie Filler, MD  mineral oil external liquid Place 3 drops into both ears every 7 (seven) days.   Yes Historical Provider, MD  pantoprazole (PROTONIX) 20 MG tablet Take 20 mg by mouth daily.   Yes Historical Provider, MD  sennosides-docusate sodium (SENOKOT-S) 8.6-50 MG tablet Take 1 tablet by mouth daily as needed for constipation.   Yes Historical Provider, MD   vitamin B-12 (CYANOCOBALAMIN) 1000 MCG tablet Take 2,000 mcg by mouth daily.   Yes Historical Provider, MD  ketoconazole (NIZORAL) 2 % cream Apply 1 application topically 2 (two) times daily. Apply to inner thigh and groin topically two times a day for yeast. Apply to inner thigh, groin and bottock BID until resolved     Historical Provider, MD    ROS:  Out of a complete 14 system review of symptoms, the patient complains only of the following symptoms, and all other reviewed systems are negative.  Decreased appetite, weight loss  Hearing loss, drooling Daytime sleepiness, snoring Seizures, weakness   Blood pressure (!) 107/54, pulse 94, height 6\' 1"  (1.854 m), weight 194 lb 8 oz (88.2 kg).  Physical Exam  General: The patient is alert and cooperative at the time of the examination.  Skin: No significant peripheral edema is noted.   Neurologic Exam  Mental status: The patient is alert and oriented x 3 at the time of the examination. The patient has apparent normal recent and remote memory, with an apparently normal attention span and concentration ability.   Cranial nerves: Facial symmetry is present. Speech is normal, no aphasia or dysarthria is noted. Extraocular movements are full. Visual fields are full.The patient is hard of hearing.  Motor: The patient has good strength in all 4 extremities.  Sensory examination: Soft touch sensation is symmetric on the face, arms, and legs.  Coordination: The patient has good finger-nose-finger and heel-to-shin bilaterally.Intention tremors are seen with finger-nose-finger bilaterally, resting tremors are also noted bilaterally.  Gait and station: The patient requires assistance with standing. Once up, he is able to ambulate a short distance with assistance, he has a tendency to lean backwards. Tandem gait was not attempted.  No drift is seen.  Reflexes: Deep tendon reflexes are symmetric.   Assessment/Plan:  1. Gelastic seizures     2. Gait disorder  The patient still continues to have brief seizure events, we will go up on the Lamictal taking 100 mg twice daily, he will continue the Vimpat taking 50 mg in the morning and 150 mg in the evening. He will follow-up in about 6 months. The patient may have features of parkinsonism, I will not start medications at this time. He will be getting some physical and occupational therapy in the near future.   Jill Alexanders MD 12/13/2016 1:45 PM  [facility] [address] [place], [place] [address]  Phone [phone] Fax [phone]

## 2016-12-19 ENCOUNTER — Emergency Department (HOSPITAL_COMMUNITY): Payer: Medicare Other

## 2016-12-19 ENCOUNTER — Encounter (HOSPITAL_COMMUNITY): Payer: Self-pay | Admitting: Emergency Medicine

## 2016-12-19 ENCOUNTER — Inpatient Hospital Stay (HOSPITAL_COMMUNITY): Payer: Medicare Other

## 2016-12-19 ENCOUNTER — Inpatient Hospital Stay (HOSPITAL_COMMUNITY)
Admission: EM | Admit: 2016-12-19 | Discharge: 2016-12-23 | DRG: 177 | Disposition: A | Discharge status: Home Health | Payer: Medicare Other | Attending: Internal Medicine | Admitting: Internal Medicine

## 2016-12-19 DIAGNOSIS — Z79899 Other long term (current) drug therapy: Secondary | ICD-10-CM | POA: Diagnosis not present

## 2016-12-19 DIAGNOSIS — D62 Acute posthemorrhagic anemia: Secondary | ICD-10-CM | POA: Diagnosis present

## 2016-12-19 DIAGNOSIS — D649 Anemia, unspecified: Secondary | ICD-10-CM | POA: Diagnosis present

## 2016-12-19 DIAGNOSIS — J9601 Acute respiratory failure with hypoxia: Secondary | ICD-10-CM | POA: Diagnosis not present

## 2016-12-19 DIAGNOSIS — I4891 Unspecified atrial fibrillation: Secondary | ICD-10-CM | POA: Diagnosis not present

## 2016-12-19 DIAGNOSIS — Z961 Presence of intraocular lens: Secondary | ICD-10-CM | POA: Diagnosis present

## 2016-12-19 DIAGNOSIS — Z7982 Long term (current) use of aspirin: Secondary | ICD-10-CM | POA: Diagnosis not present

## 2016-12-19 DIAGNOSIS — R778 Other specified abnormalities of plasma proteins: Secondary | ICD-10-CM | POA: Diagnosis present

## 2016-12-19 DIAGNOSIS — J189 Pneumonia, unspecified organism: Secondary | ICD-10-CM

## 2016-12-19 DIAGNOSIS — D500 Iron deficiency anemia secondary to blood loss (chronic): Secondary | ICD-10-CM | POA: Diagnosis present

## 2016-12-19 DIAGNOSIS — G20 Parkinson's disease: Secondary | ICD-10-CM | POA: Diagnosis present

## 2016-12-19 DIAGNOSIS — Z9842 Cataract extraction status, left eye: Secondary | ICD-10-CM

## 2016-12-19 DIAGNOSIS — R0602 Shortness of breath: Secondary | ICD-10-CM | POA: Diagnosis not present

## 2016-12-19 DIAGNOSIS — S199XXA Unspecified injury of neck, initial encounter: Secondary | ICD-10-CM | POA: Diagnosis not present

## 2016-12-19 DIAGNOSIS — Y92091 Bathroom in other non-institutional residence as the place of occurrence of the external cause: Secondary | ICD-10-CM

## 2016-12-19 DIAGNOSIS — I110 Hypertensive heart disease with heart failure: Secondary | ICD-10-CM | POA: Diagnosis present

## 2016-12-19 DIAGNOSIS — L8990 Pressure ulcer of unspecified site, unspecified stage: Secondary | ICD-10-CM | POA: Insufficient documentation

## 2016-12-19 DIAGNOSIS — E876 Hypokalemia: Secondary | ICD-10-CM | POA: Diagnosis present

## 2016-12-19 DIAGNOSIS — Z888 Allergy status to other drugs, medicaments and biological substances status: Secondary | ICD-10-CM

## 2016-12-19 DIAGNOSIS — J811 Chronic pulmonary edema: Secondary | ICD-10-CM

## 2016-12-19 DIAGNOSIS — N400 Enlarged prostate without lower urinary tract symptoms: Secondary | ICD-10-CM | POA: Diagnosis present

## 2016-12-19 DIAGNOSIS — Z66 Do not resuscitate: Secondary | ICD-10-CM | POA: Diagnosis present

## 2016-12-19 DIAGNOSIS — R4182 Altered mental status, unspecified: Secondary | ICD-10-CM | POA: Diagnosis not present

## 2016-12-19 DIAGNOSIS — W19XXXA Unspecified fall, initial encounter: Secondary | ICD-10-CM | POA: Diagnosis not present

## 2016-12-19 DIAGNOSIS — G40909 Epilepsy, unspecified, not intractable, without status epilepticus: Secondary | ICD-10-CM | POA: Diagnosis not present

## 2016-12-19 DIAGNOSIS — J690 Pneumonitis due to inhalation of food and vomit: Principal | ICD-10-CM

## 2016-12-19 DIAGNOSIS — F329 Major depressive disorder, single episode, unspecified: Secondary | ICD-10-CM | POA: Diagnosis present

## 2016-12-19 DIAGNOSIS — R748 Abnormal levels of other serum enzymes: Secondary | ICD-10-CM

## 2016-12-19 DIAGNOSIS — F419 Anxiety disorder, unspecified: Secondary | ICD-10-CM | POA: Diagnosis present

## 2016-12-19 DIAGNOSIS — R7989 Other specified abnormal findings of blood chemistry: Secondary | ICD-10-CM | POA: Diagnosis present

## 2016-12-19 DIAGNOSIS — I340 Nonrheumatic mitral (valve) insufficiency: Secondary | ICD-10-CM | POA: Diagnosis present

## 2016-12-19 DIAGNOSIS — I10 Essential (primary) hypertension: Secondary | ICD-10-CM | POA: Diagnosis not present

## 2016-12-19 DIAGNOSIS — J9600 Acute respiratory failure, unspecified whether with hypoxia or hypercapnia: Secondary | ICD-10-CM | POA: Diagnosis not present

## 2016-12-19 DIAGNOSIS — L89151 Pressure ulcer of sacral region, stage 1: Secondary | ICD-10-CM | POA: Diagnosis present

## 2016-12-19 DIAGNOSIS — I480 Paroxysmal atrial fibrillation: Secondary | ICD-10-CM | POA: Diagnosis present

## 2016-12-19 DIAGNOSIS — I5033 Acute on chronic diastolic (congestive) heart failure: Secondary | ICD-10-CM | POA: Diagnosis present

## 2016-12-19 DIAGNOSIS — I248 Other forms of acute ischemic heart disease: Secondary | ICD-10-CM | POA: Diagnosis present

## 2016-12-19 DIAGNOSIS — K219 Gastro-esophageal reflux disease without esophagitis: Secondary | ICD-10-CM | POA: Diagnosis present

## 2016-12-19 DIAGNOSIS — S3991XA Unspecified injury of abdomen, initial encounter: Secondary | ICD-10-CM | POA: Diagnosis not present

## 2016-12-19 DIAGNOSIS — J810 Acute pulmonary edema: Secondary | ICD-10-CM | POA: Diagnosis not present

## 2016-12-19 DIAGNOSIS — Z9841 Cataract extraction status, right eye: Secondary | ICD-10-CM | POA: Diagnosis not present

## 2016-12-19 DIAGNOSIS — S0990XA Unspecified injury of head, initial encounter: Secondary | ICD-10-CM | POA: Diagnosis not present

## 2016-12-19 DIAGNOSIS — R531 Weakness: Secondary | ICD-10-CM | POA: Diagnosis not present

## 2016-12-19 DIAGNOSIS — I509 Heart failure, unspecified: Secondary | ICD-10-CM | POA: Diagnosis not present

## 2016-12-19 DIAGNOSIS — K5900 Constipation, unspecified: Secondary | ICD-10-CM | POA: Diagnosis present

## 2016-12-19 LAB — CBC
HCT: 15.1 % — ABNORMAL LOW (ref 39.0–52.0)
HCT: 15.1 % — ABNORMAL LOW (ref 39.0–52.0)
HEMOGLOBIN: 4.2 g/dL — AB (ref 13.0–17.0)
HEMOGLOBIN: 4.2 g/dL — AB (ref 13.0–17.0)
MCH: 18.4 pg — AB (ref 26.0–34.0)
MCH: 18.4 pg — ABNORMAL LOW (ref 26.0–34.0)
MCHC: 27.8 g/dL — AB (ref 30.0–36.0)
MCHC: 27.8 g/dL — AB (ref 30.0–36.0)
MCV: 66.2 fL — ABNORMAL LOW (ref 78.0–100.0)
MCV: 66.2 fL — ABNORMAL LOW (ref 78.0–100.0)
PLATELETS: 362 10*3/uL (ref 150–400)
Platelets: 308 10*3/uL (ref 150–400)
RBC: 2.28 MIL/uL — AB (ref 4.22–5.81)
RBC: 2.28 MIL/uL — ABNORMAL LOW (ref 4.22–5.81)
RDW: 21.2 % — ABNORMAL HIGH (ref 11.5–15.5)
RDW: 21 % — AB (ref 11.5–15.5)
WBC: 6.2 10*3/uL (ref 4.0–10.5)
WBC: 7.5 10*3/uL (ref 4.0–10.5)

## 2016-12-19 LAB — URINALYSIS, ROUTINE W REFLEX MICROSCOPIC
BILIRUBIN URINE: NEGATIVE
Glucose, UA: NEGATIVE mg/dL
Hgb urine dipstick: NEGATIVE
KETONES UR: NEGATIVE mg/dL
LEUKOCYTES UA: NEGATIVE
NITRITE: NEGATIVE
PROTEIN: NEGATIVE mg/dL
Specific Gravity, Urine: 1.016 (ref 1.005–1.030)
pH: 5 (ref 5.0–8.0)

## 2016-12-19 LAB — I-STAT TROPONIN, ED: TROPONIN I, POC: 0.17 ng/mL — AB (ref 0.00–0.08)

## 2016-12-19 LAB — APTT: aPTT: 29 seconds (ref 24–36)

## 2016-12-19 LAB — SAVE SMEAR

## 2016-12-19 LAB — HEPATIC FUNCTION PANEL
ALT: 18 U/L (ref 17–63)
AST: 31 U/L (ref 15–41)
Albumin: 3.7 g/dL (ref 3.5–5.0)
Alkaline Phosphatase: 72 U/L (ref 38–126)
BILIRUBIN TOTAL: 0.4 mg/dL (ref 0.3–1.2)
Total Protein: 6.3 g/dL — ABNORMAL LOW (ref 6.5–8.1)

## 2016-12-19 LAB — CBG MONITORING, ED: GLUCOSE-CAPILLARY: 105 mg/dL — AB (ref 65–99)

## 2016-12-19 LAB — BASIC METABOLIC PANEL
ANION GAP: 9 (ref 5–15)
BUN: 26 mg/dL — ABNORMAL HIGH (ref 6–20)
CALCIUM: 9.1 mg/dL (ref 8.9–10.3)
CO2: 22 mmol/L (ref 22–32)
Chloride: 106 mmol/L (ref 101–111)
Creatinine, Ser: 1.06 mg/dL (ref 0.61–1.24)
GFR, EST NON AFRICAN AMERICAN: 59 mL/min — AB (ref >60)
GLUCOSE: 108 mg/dL — AB (ref 65–99)
Potassium: 4.8 mmol/L (ref 3.5–5.1)
Sodium: 137 mmol/L (ref 135–145)

## 2016-12-19 LAB — LIPASE, BLOOD: LIPASE: 20 U/L (ref 11–51)

## 2016-12-19 LAB — BRAIN NATRIURETIC PEPTIDE: B Natriuretic Peptide: 343.5 pg/mL — ABNORMAL HIGH (ref 0.0–100.0)

## 2016-12-19 LAB — POC OCCULT BLOOD, ED: Fecal Occult Bld: NEGATIVE

## 2016-12-19 LAB — D-DIMER, QUANTITATIVE: D-Dimer, Quant: 0.42 ug/mL-FEU (ref 0.00–0.50)

## 2016-12-19 LAB — PREPARE RBC (CROSSMATCH)

## 2016-12-19 LAB — TROPONIN I: Troponin I: 0.17 ng/mL (ref <0.03)

## 2016-12-19 LAB — PROTIME-INR
INR: 1.13
Prothrombin Time: 14.6 seconds (ref 11.4–15.2)

## 2016-12-19 LAB — I-STAT CG4 LACTIC ACID, ED: Lactic Acid, Venous: 1.84 mmol/L (ref 0.5–1.9)

## 2016-12-19 LAB — LACTATE DEHYDROGENASE: LDH: 151 U/L (ref 98–192)

## 2016-12-19 MED ORDER — ONDANSETRON HCL 4 MG PO TABS: 4.0000 mg | ORAL_TABLET | Freq: Four times a day (QID) | ORAL | Status: DC | PRN

## 2016-12-19 MED ORDER — LACOSAMIDE 50 MG PO TABS: 50.0000 mg | ORAL_TABLET | Freq: Two times a day (BID) | ORAL | Status: DC

## 2016-12-19 MED ORDER — LOPERAMIDE HCL 2 MG PO CAPS: 2.0000 mg | ORAL_CAPSULE | Freq: Four times a day (QID) | ORAL | Status: DC | PRN

## 2016-12-19 MED ORDER — SODIUM CHLORIDE 0.9 % IV SOLN: Freq: Once | INTRAVENOUS | Status: DC

## 2016-12-19 MED ORDER — HYDRALAZINE HCL 20 MG/ML IJ SOLN: 5.0000 mg | INTRAMUSCULAR | Status: DC | PRN

## 2016-12-19 MED ORDER — SENNOSIDES-DOCUSATE SODIUM 8.6-50 MG PO TABS: 1.0000 | ORAL_TABLET | Freq: Every evening | ORAL | Status: DC | PRN

## 2016-12-19 MED ORDER — FUROSEMIDE 10 MG/ML IJ SOLN
40.0000 mg | Freq: Once | INTRAMUSCULAR | Status: AC
  Administered 2016-12-19: 40 mg via INTRAVENOUS
  Filled 2016-12-19: qty 4

## 2016-12-19 MED ORDER — IPRATROPIUM-ALBUTEROL 0.5-2.5 (3) MG/3ML IN SOLN
3.0000 mL | Freq: Four times a day (QID) | RESPIRATORY_TRACT | Status: DC
  Administered 2016-12-19 – 2016-12-20 (×3): 3 mL via RESPIRATORY_TRACT
  Filled 2016-12-19 (×5): qty 3

## 2016-12-19 MED ORDER — DEXTROSE 5 % IV SOLN: 500.0000 mg | Freq: Once | INTRAVENOUS | Status: DC

## 2016-12-19 MED ORDER — CHOLESTYRAMINE 4 G PO PACK: 4.0000 g | PACK | ORAL | Status: DC

## 2016-12-19 MED ORDER — DEXTROSE 5 % IV SOLN: 1.0000 g | Freq: Once | INTRAVENOUS | Status: DC

## 2016-12-19 MED ORDER — PANTOPRAZOLE SODIUM 20 MG PO TBEC
20.0000 mg | DELAYED_RELEASE_TABLET | Freq: Every day | ORAL | Status: DC
Start: 2016-12-20 — End: 2016-12-23
  Administered 2016-12-20 – 2016-12-23 (×4): 20 mg via ORAL
  Filled 2016-12-19 (×4): qty 1

## 2016-12-19 MED ORDER — LACOSAMIDE 50 MG PO TABS
150.0000 mg | ORAL_TABLET | Freq: Every day | ORAL | Status: DC
  Administered 2016-12-19 – 2016-12-22 (×4): 150 mg via ORAL
  Filled 2016-12-19 (×4): qty 3

## 2016-12-19 MED ORDER — LAMOTRIGINE 100 MG PO TABS
100.0000 mg | ORAL_TABLET | Freq: Two times a day (BID) | ORAL | Status: DC
  Administered 2016-12-19 – 2016-12-23 (×8): 100 mg via ORAL
  Filled 2016-12-19 (×8): qty 1

## 2016-12-19 MED ORDER — MINERAL OIL LIGHT OIL: 3.0000 [drp] | TOPICAL_OIL | Status: DC

## 2016-12-19 MED ORDER — LACOSAMIDE 50 MG PO TABS
50.0000 mg | ORAL_TABLET | Freq: Every day | ORAL | Status: DC
  Administered 2016-12-20 – 2016-12-23 (×4): 50 mg via ORAL
  Filled 2016-12-19 (×5): qty 1

## 2016-12-19 MED ORDER — ONDANSETRON HCL 4 MG/2ML IJ SOLN: 4.0000 mg | Freq: Four times a day (QID) | INTRAMUSCULAR | Status: DC | PRN

## 2016-12-19 MED ORDER — NITROGLYCERIN 0.4 MG SL SUBL: 0.4000 mg | SUBLINGUAL_TABLET | SUBLINGUAL | Status: DC | PRN

## 2016-12-19 MED ORDER — ACETAMINOPHEN 325 MG PO TABS
650.0000 mg | ORAL_TABLET | Freq: Four times a day (QID) | ORAL | Status: DC | PRN
  Administered 2016-12-20: 650 mg via ORAL
  Filled 2016-12-19: qty 2

## 2016-12-19 MED ORDER — ACETAMINOPHEN 650 MG RE SUPP: 650.0000 mg | Freq: Four times a day (QID) | RECTAL | Status: DC | PRN

## 2016-12-19 MED ORDER — SODIUM CHLORIDE 0.9 % IV SOLN
Freq: Once | INTRAVENOUS | Status: AC
Start: 2016-12-19 — End: 2016-12-19
  Administered 2016-12-19: 22:00:00 via INTRAVENOUS

## 2016-12-19 MED ORDER — FUROSEMIDE 10 MG/ML IJ SOLN
40.0000 mg | Freq: Once | INTRAMUSCULAR | Status: AC
  Administered 2016-12-20: 40 mg via INTRAVENOUS
  Filled 2016-12-19: qty 4

## 2016-12-19 MED ORDER — FUROSEMIDE 10 MG/ML IJ SOLN
40.0000 mg | Freq: Every day | INTRAMUSCULAR | Status: DC
  Administered 2016-12-20 – 2016-12-21 (×2): 40 mg via INTRAVENOUS
  Filled 2016-12-19 (×2): qty 4

## 2016-12-19 MED ORDER — SODIUM CHLORIDE 0.9% FLUSH
3.0000 mL | Freq: Two times a day (BID) | INTRAVENOUS | Status: DC
  Administered 2016-12-19 – 2016-12-23 (×6): 3 mL via INTRAVENOUS

## 2016-12-19 MED ORDER — VITAMIN B-12 1000 MCG PO TABS
2000.0000 ug | ORAL_TABLET | Freq: Every day | ORAL | Status: DC
  Administered 2016-12-20 – 2016-12-23 (×4): 2000 ug via ORAL
  Filled 2016-12-19 (×4): qty 2

## 2016-12-19 NOTE — H&P (Signed)
History and Physical    Barry Dean PPI:[id number] DOB: 08/23/1924 DOA: 12/19/2016  Referring MD/NP/PA:   PCP: [name], MD   Patient coming from:  The patient is coming from SNF  At baseline, pt is dependent for most of ADL.  Chief Complaint: fall, SOB  HPI: Barry Dean is a 83 y.o. male with medical history significant of hypertension, GERD, depression, anxiety, seizure, Parkinson's disease, mitral valve regurgitation, BPH, atrial fibrillation not on anticoagulants, dCHF, who presents with fall, shortness breath.  Pt is not oriented x3, and is unable to provide accurate medical history, therefore, most of the history is obtained by discussing the case with ED physician, per EMS report, and with the nursing staff. Not sure about his baseline mental status.  Per EDP, pt patient had unwitnessed fall in bathoom. Pt is unsure if he had a loss of consciousness. Patient is complaining of generalized pain. He has SOB per EDP. No obvious signs of injury noted. Patient has hx of the same per report. When I saw pt in ED, he does not have active cough, nausea, vomiting or diarrhea. He is not oriented x 3. He moves all extremities. He has some mild leg edema.   ED Course: pt was found to have hemoglobin decreased from 9.3 on 06/12/16 to 4.2 on admission, negative FOBT, negative d-dimer, lactic acid 1.84, troponin 0.17, BNP 343.5, lipase 20, electrolytes and renal function okay, temperature normal, oxygen saturation 85% on room air, which improved to 91 on 2L nasal cannula oxygen. Chest x-ray showed increasing interstitial and alveolar airspace opacities associated with pulmonary edema and/or pneumonia. Negative CT-head for acute intracranial abnormalities. Negative CT of C-spine for acute bony abnormalities. Patient is admitted to stepdown as inpatient. Pending UA.  Review of Systems: Could not be reviewed accurately due to altered mental status.  Allergy:  Allergies  Allergen Reactions  .  Topamax Other (See Comments)    Reaction:  Unknown   . Uroxatral [Alfuzosin Hydrochloride] Other (See Comments)    Reaction:  Unknown   . Valium Other (See Comments)    Reaction:  Unknown     Past Medical History:  Diagnosis Date  . Abnormality of gait   . Anxiety   . Arthritis   . Atrial fibrillation ([place])   . BPH (benign prostatic hyperplasia)   . Deafness   . Dehydration   . Depression   . GERD (gastroesophageal reflux disease)   . Hypertension   . Melanoma ([place])   . Mitral regurgitation   . Reflux   . Seizure ([place])   . Seizures ([place])   . Syncope   . Tremor 09/27/2015  . Tremor, essential     Past Surgical History:  Procedure Laterality Date  . APPENDECTOMY    . CATARACT EXTRACTION W/ INTRAOCULAR LENS  IMPLANT, BILATERAL    . ESOPHAGOGASTRODUODENOSCOPY (EGD) WITH PROPOFOL N/A 09/15/2014   Procedure: ESOPHAGOGASTRODUODENOSCOPY (EGD) WITH PROPOFOL;  Surgeon: [name], MD;  Location: [name] ENDOSCOPY;  Service: Endoscopy;  Laterality: N/A;  . unknown      Social History:  reports that he has never smoked. He has never used smokeless tobacco. He reports that he does not drink alcohol or use drugs.  Family History:  Family History  Problem Relation Age of Onset  . Cancer Mother     Skin cancer  . Stroke Father   . Parkinsonism Sister   . Diabetes Brother      Prior to Admission medications   Medication Sig  Start Date End Date Taking? Authorizing Provider  amLODipine-benazepril (LOTREL) 5-20 MG capsule Take 1 capsule by mouth daily. Resume in 4 days. 03/26/16  Yes Eugenie Filler, MD  aspirin 81 MG chewable tablet Chew 1 tablet (81 mg total) by mouth every evening. 09/22/14  Yes Thurnell Lose, MD  cholestyramine (QUESTRAN) 4 g packet Take 4 g by mouth 2 (two) times daily.   Yes Historical Provider, MD  feeding supplement, ENSURE ENLIVE, (ENSURE ENLIVE) LIQD Take 237 mLs by mouth daily. 03/22/16  Yes Eugenie Filler, MD  ipratropium-albuterol (DUONEB) 0.5-2.5  (3) MG/3ML SOLN Take 3 mLs by nebulization every 6 (six) hours as needed. Patient taking differently: Take 3 mLs by nebulization every 6 (six) hours as needed (for shortness of breath).  03/22/16  Yes Eugenie Filler, MD  lacosamide (VIMPAT) 50 MG TABS tablet Take 1 tablet in the AM (50 mg) and 3 tablets (150 mg ) in the PM. Patient taking differently: Take 50-150 mg by mouth 2 (two) times daily. Pt takes one tablet in the morning and three at bedtime. 01/31/16  Yes Ward Givens, NP  lamoTRIgine (LAMICTAL) 100 MG tablet Take 1 tablet (100 mg total) by mouth 2 (two) times daily. 12/13/16  Yes Kathrynn Ducking, MD  loperamide (IMODIUM) 2 MG capsule Take 2 mg by mouth every 6 (six) hours as needed for diarrhea or loose stools.   Yes Historical Provider, MD  mineral oil external liquid Place 3 drops into both ears every Friday.    Yes Historical Provider, MD  pantoprazole (PROTONIX) 20 MG tablet Take 20 mg by mouth daily.   Yes Historical Provider, MD  sennosides-docusate sodium (SENOKOT-S) 8.6-50 MG tablet Take 1 tablet by mouth at bedtime as needed for constipation.    Yes Historical Provider, MD  vitamin B-12 (CYANOCOBALAMIN) 1000 MCG tablet Take 2,000 mcg by mouth daily.   Yes Historical Provider, MD    Physical Exam: Vitals:   12/19/16 1844 12/19/16 1845 12/19/16 1900 12/19/16 1930  BP: (!) 117/53  (!) 129/56 (!) 111/45  Pulse: 83 85 89   Resp: (!) 24 (!) 22 (!) 22 (!) 27  Temp:      TempSrc:      SpO2: 94% 91% (!) 88%   Weight:      Height:       General: Not in acute distress HEENT:       Eyes: PERRL, EOMI, no scleral icterus.       ENT: No discharge from the ears and nose, no pharynx injection, no tonsillar enlargement.        Neck: No JVD, no bruit, no mass felt. Heme: No neck lymph node enlargement. Cardiac: S1/S2, RRR, No murmurs, No gallops or rubs. Respiratory: No rales, wheezing, rhonchi or rubs. GI: mildly distended, nontender, no organomegaly, BS present. GU: No  hematuria Ext: 1+ pitting leg edema bilaterally. 2+DP/PT pulse bilaterally. Musculoskeletal: No joint deformities, No joint redness or warmth, no limitation of ROM in spin. Skin: No rashes.  Neuro: confused, not oriented X3, cranial nerves II-XII grossly intact, moves all extremities normally.  Psych: Patient is not psychotic, no suicidal or hemocidal ideation.  Labs on Admission: I have personally reviewed following labs and imaging studies  CBC:  Recent Labs Lab 12/19/16 1733  WBC 6.2  HGB 4.2*  HCT 15.1*  MCV 66.2*  PLT 774   Basic Metabolic Panel:  Recent Labs Lab 12/19/16 1733  NA 137  K 4.8  CL 106  CO2  22  GLUCOSE 108*  BUN 26*  CREATININE 1.06  CALCIUM 9.1   GFR: Estimated Creatinine Clearance: 51.3 mL/min (by C-G formula based on SCr of 1.06 mg/dL). Liver Function Tests:  Recent Labs Lab 12/19/16 1758  AST 31  ALT 18  ALKPHOS 72  BILITOT 0.4  PROT 6.3*  ALBUMIN 3.7    Recent Labs Lab 12/19/16 1758  LIPASE 20   No results for input(s): AMMONIA in the last 168 hours. Coagulation Profile: No results for input(s): INR, PROTIME in the last 168 hours. Cardiac Enzymes: No results for input(s): CKTOTAL, CKMB, CKMBINDEX, TROPONINI in the last 168 hours. BNP (last 3 results) No results for input(s): PROBNP in the last 8760 hours. HbA1C: No results for input(s): HGBA1C in the last 72 hours. CBG:  Recent Labs Lab 12/19/16 1728  GLUCAP 105*   Lipid Profile: No results for input(s): CHOL, HDL, LDLCALC, TRIG, CHOLHDL, LDLDIRECT in the last 72 hours. Thyroid Function Tests: No results for input(s): TSH, T4TOTAL, FREET4, T3FREE, THYROIDAB in the last 72 hours. Anemia Panel: No results for input(s): VITAMINB12, FOLATE, FERRITIN, TIBC, IRON, RETICCTPCT in the last 72 hours. Urine analysis:    Component Value Date/Time   COLORURINE YELLOW 03/12/2016 1414   APPEARANCEUR CLEAR 03/12/2016 1414   LABSPEC 1.018 03/12/2016 1414   PHURINE 6.5 03/12/2016  1414   GLUCOSEU NEGATIVE 03/12/2016 1414   HGBUR NEGATIVE 03/12/2016 1414   BILIRUBINUR NEGATIVE 03/12/2016 1414   KETONESUR NEGATIVE 03/12/2016 1414   PROTEINUR NEGATIVE 03/12/2016 1414   UROBILINOGEN 1.0 11/12/2014 1132   NITRITE NEGATIVE 03/12/2016 1414   LEUKOCYTESUR NEGATIVE 03/12/2016 1414   Sepsis Labs: @LABRCNTIP (procalcitonin:4,lacticidven:4) )No results found for this or any previous visit (from the past 240 hour(s)).   Radiological Exams on Admission: Dg Chest 2 View  Result Date: 12/19/2016 CLINICAL DATA:  Unwitnessed fall in bathroom. Shortness of breath and hypoxia. EXAM: CHEST  2 VIEW COMPARISON:  Chest radiograph March 21, 2016 FINDINGS: Cardiac silhouette is mildly enlarged. Tortuous calcified aorta. Interstitial and alveolar airspace opacities for 7 prior imaging. Small probable LEFT pleural effusion. No pneumothorax. Patient rotated LEFT. Old LEFT humerus fracture. Large hiatal hernia. IMPRESSION: Increasing interstitial and alveolar airspace opacities associated with pulmonary edema and/or pneumonia. Stable cardiomegaly. Electronically Signed   By: Elon Alas M.D.   On: 12/19/2016 18:34   Ct Head Wo Contrast  Result Date: 12/19/2016 CLINICAL DATA:  Initial evaluation for acute trauma, unwitnessed fall. EXAM: CT HEAD WITHOUT CONTRAST CT CERVICAL SPINE WITHOUT CONTRAST TECHNIQUE: Multidetector CT imaging of the head and cervical spine was performed following the standard protocol without intravenous contrast. Multiplanar CT image reconstructions of the cervical spine were also generated. COMPARISON:  Prior CT from 07/08/2016. FINDINGS: CT HEAD FINDINGS Brain: Mild for age cerebral atrophy with moderate chronic microvascular ischemic disease, stable. No acute intracranial hemorrhage. No evidence for acute large vessel territory infarct. No mass lesion, midline shift or mass effect. No hydrocephalus. No extra-axial fluid collection. Vascular: No hyperdense vessel. Scattered  vascular calcifications noted within the carotid siphons. Skull: Scalp soft tissues demonstrate no acute abnormality. Calvarium intact. Sinuses/Orbits: Globes and orbital soft tissues within normal limits. Paranasal sinuses and mastoids are clear. CT CERVICAL SPINE FINDINGS Alignment: Straightening of the normal cervical lordosis. Trace anterolisthesis of C3 on C4, with trace retrolisthesis of C4 on C5. Skull base and vertebrae: Skullbase intact. C1-2 articulations are unchanged. Rotation of C1 on C2 likely positional. Dens is intact. Vertebral body heights maintained. The no acute fracture. Soft tissues and  spinal canal: Visualized soft tissues of the neck demonstrate no acute abnormality. No prevertebral edema. Prominent vascular calcifications about the carotid bifurcations. Disc levels: Moderate to advanced multilevel degenerative spondylolysis, greatest at C4-5 and C5-6. Predominant right-sided facet arthrosis. Upper chest: Visualized upper chest demonstrates no acute abnormality. Partially visualized lung apices are grossly clear. Other: IMPRESSION: CT BRAIN: 1. No acute intracranial process. 2. Stable atrophy with chronic microvascular ischemic disease. CT CERVICAL SPINE: 1. No acute traumatic injury within cervical spine. 2. Stable alignment with advanced multilevel degenerative disc disease. Electronically Signed   By: Jeannine Boga M.D.   On: 12/19/2016 18:47   Ct Cervical Spine Wo Contrast  Result Date: 12/19/2016 CLINICAL DATA:  Initial evaluation for acute trauma, unwitnessed fall. EXAM: CT HEAD WITHOUT CONTRAST CT CERVICAL SPINE WITHOUT CONTRAST TECHNIQUE: Multidetector CT imaging of the head and cervical spine was performed following the standard protocol without intravenous contrast. Multiplanar CT image reconstructions of the cervical spine were also generated. COMPARISON:  Prior CT from 07/08/2016. FINDINGS: CT HEAD FINDINGS Brain: Mild for age cerebral atrophy with moderate chronic  microvascular ischemic disease, stable. No acute intracranial hemorrhage. No evidence for acute large vessel territory infarct. No mass lesion, midline shift or mass effect. No hydrocephalus. No extra-axial fluid collection. Vascular: No hyperdense vessel. Scattered vascular calcifications noted within the carotid siphons. Skull: Scalp soft tissues demonstrate no acute abnormality. Calvarium intact. Sinuses/Orbits: Globes and orbital soft tissues within normal limits. Paranasal sinuses and mastoids are clear. CT CERVICAL SPINE FINDINGS Alignment: Straightening of the normal cervical lordosis. Trace anterolisthesis of C3 on C4, with trace retrolisthesis of C4 on C5. Skull base and vertebrae: Skullbase intact. C1-2 articulations are unchanged. Rotation of C1 on C2 likely positional. Dens is intact. Vertebral body heights maintained. The no acute fracture. Soft tissues and spinal canal: Visualized soft tissues of the neck demonstrate no acute abnormality. No prevertebral edema. Prominent vascular calcifications about the carotid bifurcations. Disc levels: Moderate to advanced multilevel degenerative spondylolysis, greatest at C4-5 and C5-6. Predominant right-sided facet arthrosis. Upper chest: Visualized upper chest demonstrates no acute abnormality. Partially visualized lung apices are grossly clear. Other: IMPRESSION: CT BRAIN: 1. No acute intracranial process. 2. Stable atrophy with chronic microvascular ischemic disease. CT CERVICAL SPINE: 1. No acute traumatic injury within cervical spine. 2. Stable alignment with advanced multilevel degenerative disc disease. Electronically Signed   By: Jeannine Boga M.D.   On: 12/19/2016 18:47     EKG: Independently reviewed.  Sinus rhythm, QTC 445, delayed R-wave progression.   Assessment/Plan Principal Problem:   Symptomatic anemia Active Problems:   Seizure disorder (HCC)   HTN (hypertension)   Benign essential HTN   PAF (paroxysmal atrial fibrillation)  (HCC)   Acute respiratory failure with hypoxia (HCC)   Acute on chronic diastolic heart failure (HCC)   Fall   Elevated troponin   Symptomatic anemia: Hemoglobin decreased from 9.3 on 06/12/16 to 4.2 on admission. He has SOB and positive trop. Etiology is not clear. FOBT negative. Since pt had fall, will need to rule out internal bleeding.  -will admit to SDU as inpt. -will transfuse 3 units of blood -will check LDH, peripheral smear, haptoglobin -Ct-abdomen/pelvis without contrast to rule out internal bleeding -2 large born IV -cbc q6h, transfuse if Hgb<7.0  Acute respiratory failure with hypoxia: Most likely due to severe anemia. Patient may also have mildly exacerbated dCHF given elevated BNP 343, bilateral leg edema. Chest x-ray showed increasing interstitial and alveolar airspace opacities associated with pulmonary edema and/or  pneumonia, but pt does not have fever or leukocytosis, lactate is normal. Clinically patient does not seem to have pneumonia. Will discontinue antibiotics which were ordered by EDP physician. -will treat acute on chronic diastolic heart failure as below -treat symptomatic anemia as above  Acute on chronic diastolic heart failure: 2-D echo on 03/14/16 showed EF of 60-65% with grade 2 diastolic dysfunction. Patient has bilateral leg edema and elevated BNP 343. Chest x-ray showea possible pulmonary edema, clinically consistent with CHF exacerbation. -will give 40 mg of lasix after finishing first unit of blood transfusion -Lasix 40 mg daily starting in morning -f/u 2d echo and trop x 3  Elevated troponin: Troponin 0.17. Patient does not seem to have chest pain though not very sure. Most likely due to demand ischemia secondary to severe anemia and CHF exacerbation. - cycle CE q6 x3 and repeat EKG in the am  - Nitroglycerin prn - hold ASA due to severe anemia with unclear etiology - Risk factor stratification: will check FLP and A1C  - 2d echo  HTN: -hold  Lotrel given severe anemia -IV hydralazine when necessary  GERD: -Protonix  Hx of PAF: CHA2DS2-VASc Score is 4, needs oral anticoagulation, but patient is not on [facility], possibly due to history of seizure, old age and high risk for fall. Heart rate is well controlled without using CCB or BB  -tele monitoring.   Seizure disorder [facility]):  -continue Lamictal and Vimpat -Seizure precaution  Fall: No obvious injury. CT head and CT C-spine is negative for acute abnormalities. Most like due to multi-factoral etiology, including seizure, history of Parkinson's disease, severe anemia, CHF exacerbation. -PT/OT (when able to. Not ordered yet. Pt is on bed rest now) -Frequent neuro check    DVT ppx: SCD Code Status: DNR (pt has yellow paper with "DNR" on it) Family Communication: None at bed side.   Disposition Plan:  Anticipate discharge back to previous home environment Consults called:  none Admission status: SDU/inpation       Date of Service 12/19/2016    Ryley Teater, [facility] Pager [phone]  If 7PM-7AM, please contact night-coverage [email or web address] Password [facility] 12/19/2016, 7:58 PM

## 2016-12-19 NOTE — ED Notes (Signed)
Patient transported to radiology

## 2016-12-19 NOTE — ED Notes (Signed)
Bed: WA21 Expected date:  Expected time:  Means of arrival:  Comments: EMS  

## 2016-12-19 NOTE — ED Notes (Signed)
Occult card at bedside 

## 2016-12-19 NOTE — ED Notes (Signed)
Lab called with a critical Hemoglobin 4.2

## 2016-12-19 NOTE — ED Notes (Signed)
ED Provider at bedside. 

## 2016-12-19 NOTE — ED Triage Notes (Signed)
Per EMS, patient had unwitnessed fall in bathoom. Pt unsure if he had a loss of consciousness. Patient is complaining of generalized pain and patient has hx of the same. No obvious signs of injury noted. Patient placed in modified c-collar by EMS. Denies blood thinner use. Patient is alert and oriented at baseline. Patient is from [name]. DNR at bedside.

## 2016-12-19 NOTE — ED Provider Notes (Signed)
[facility] DEPT Provider Note   CSN: [id number] Arrival date & time: 12/19/16  1709     History   Chief Complaint Chief Complaint  Patient presents with  . Fall    HPI Barry Dean is a 83 y.o. male.  The history is provided by the patient, a relative and medical records.  Fall  This is a recurrent problem. The current episode started less than 1 hour ago. The problem occurs constantly. The problem has not changed since onset.Associated symptoms include shortness of breath. Pertinent negatives include no chest pain, no abdominal pain and no headaches. Nothing aggravates the symptoms. Nothing relieves the symptoms. The treatment provided no relief.    Past Medical History:  Diagnosis Date  . Abnormality of gait   . Anxiety   . Arthritis   . Atrial fibrillation ([place])   . BPH (benign prostatic hyperplasia)   . Deafness   . Dehydration   . Depression   . GERD (gastroesophageal reflux disease)   . Hypertension   . Melanoma ([place])   . Mitral regurgitation   . Reflux   . Seizure ([place])   . Seizures ([place])   . Syncope   . Tremor 09/27/2015  . Tremor, essential     Patient Active Problem List   Diagnosis Date Noted  . Acute on chronic diastolic heart failure ([place]) 03/22/2016  . Sepsis, unspecified organism ([place])   . Hypokalemia   . Hypomagnesemia   . Sepsis due to pneumonia ([place])   . Sepsis ([place]) 03/12/2016  . Nausea & vomiting 03/12/2016  . Diarrhea 03/12/2016  . Acute encephalopathy 03/12/2016  . Acute kidney injury ([place]) 03/12/2016  . Acute respiratory failure with hypoxia ([place]) 03/12/2016  . Septic shock ([place])   . Abnormality of gait 09/27/2015  . Tremor 09/27/2015  . Undiagnosed cardiac murmurs 03/06/2015  . Syncope and collapse 03/05/2015  . Parkinson's disease ([place]) 03/05/2015  . PAF (paroxysmal atrial fibrillation) ([place]) 03/05/2015  . Bradycardia 03/05/2015  . Deafness 03/05/2015  . Mild mitral regurgitation 03/05/2015  . Emesis, persistent  09/14/2014  . Benign essential HTN 09/14/2014  . Syncope 09/13/2013  . ?? Respiratory failure 09/13/2013  . Noninfectious gastroenteritis and colitis 11/12/2012  . Acute and chronic respiratory failure 11/12/2012  . Community acquired pneumonia 11/12/2012  . HCAP (healthcare-associated pneumonia) 07/18/2011  . Seizure disorder ([place]) 07/18/2011  . HTN (hypertension) 07/18/2011  . Hyponatremia 07/18/2011    Past Surgical History:  Procedure Laterality Date  . APPENDECTOMY    . CATARACT EXTRACTION W/ INTRAOCULAR LENS  IMPLANT, BILATERAL    . ESOPHAGOGASTRODUODENOSCOPY (EGD) WITH PROPOFOL N/A 09/15/2014   Procedure: ESOPHAGOGASTRODUODENOSCOPY (EGD) WITH PROPOFOL;  Surgeon: [name], MD;  Location: [facility] ENDOSCOPY;  Service: Endoscopy;  Laterality: N/A;  . unknown         Home Medications    Prior to Admission medications   Medication Sig Start Date End Date Taking? Authorizing Provider  amLODipine-benazepril (LOTREL) 5-20 MG capsule Take 1 capsule by mouth daily. Resume in 4 days. 03/26/16   Eugenie Filler, MD  aspirin 81 MG chewable tablet Chew 1 tablet (81 mg total) by mouth every evening. 09/22/14   Thurnell Lose, MD  cholestyramine (QUESTRAN) 4 g packet Take 4 g by mouth 2 (two) times daily.    Historical Provider, MD  feeding supplement, ENSURE ENLIVE, (ENSURE ENLIVE) LIQD Take 237 mLs by mouth daily. 03/22/16   Eugenie Filler, MD  ipratropium-albuterol (DUONEB) 0.5-2.5 (3) MG/3ML SOLN Take 3 mLs  by nebulization every 6 (six) hours as needed. Patient taking differently: Take 3 mLs by nebulization every 6 (six) hours as needed (shortness of breath).  03/22/16   Eugenie Filler, MD  ketoconazole (NIZORAL) 2 % cream Apply 1 application topically 2 (two) times daily. Apply to inner thigh and groin topically two times a day for yeast. Apply to inner thigh, groin and bottock BID until resolved     Historical Provider, MD  lacosamide (VIMPAT) 50 MG TABS tablet Take 1 tablet in  the AM (50 mg) and 3 tablets (150 mg ) in the PM. Patient taking differently: Take 50-150 mg by mouth 2 (two) times daily. Take 1 tablet in the AM (50 mg) and 3 tablets (150 mg ) in the PM. 01/31/16   Ward Givens, NP  lamoTRIgine (LAMICTAL) 100 MG tablet Take 1 tablet (100 mg total) by mouth 2 (two) times daily. 12/13/16   Kathrynn Ducking, MD  mineral oil external liquid Place 3 drops into both ears every 7 (seven) days.    Historical Provider, MD  pantoprazole (PROTONIX) 20 MG tablet Take 20 mg by mouth daily.    Historical Provider, MD  sennosides-docusate sodium (SENOKOT-S) 8.6-50 MG tablet Take 1 tablet by mouth daily as needed for constipation.    Historical Provider, MD  vitamin B-12 (CYANOCOBALAMIN) 1000 MCG tablet Take 2,000 mcg by mouth daily.    Historical Provider, MD    Family History Family History  Problem Relation Age of Onset  . Cancer Mother     Skin cancer  . Stroke Father   . Parkinsonism Sister   . Diabetes Brother     Social History Social History  Substance Use Topics  . Smoking status: Never Smoker  . Smokeless tobacco: Never Used  . Alcohol use No     Allergies   Topamax; Uroxatral [alfuzosin hydrochloride]; and Valium   Review of Systems Review of Systems  Constitutional: Positive for fatigue. Negative for chills, diaphoresis, fever and unexpected weight change.  HENT: Negative for congestion and rhinorrhea.   Respiratory: Positive for cough and shortness of breath. Negative for chest tightness, wheezing and stridor.   Cardiovascular: Negative for chest pain, palpitations and leg swelling.  Gastrointestinal: Negative for abdominal pain, diarrhea and nausea.  Genitourinary: Negative for dysuria and flank pain.  Musculoskeletal: Negative for back pain, neck pain and neck stiffness.  Skin: Negative for rash and wound.  Neurological: Negative for light-headedness and headaches.  Psychiatric/Behavioral: Positive for confusion (at baseline per family).  Negative for agitation.  All other systems reviewed and are negative.    Physical Exam Updated Vital Signs BP (!) 129/48 (BP Location: Right Arm)   Pulse 97   Temp 98.7 F (37.1 C) (Oral)   Resp 18   Ht 6\' 1"  (1.854 m)   Wt 194 lb (88 kg)   SpO2 (!) 88%   BMI 25.60 kg/m   Physical Exam  Constitutional: He is oriented to person, place, and time. He appears well-developed and well-nourished. No distress.  HENT:  Head: Normocephalic and atraumatic.  Right Ear: External ear normal.  Left Ear: External ear normal.  Nose: Nose normal.  Mouth/Throat: Oropharynx is clear and moist. No oropharyngeal exudate.  Eyes: Conjunctivae and EOM are normal. Pupils are equal, round, and reactive to light.  Neck: Normal range of motion. Neck supple.  Cardiovascular: Normal rate, regular rhythm and intact distal pulses.   No murmur heard. Pulmonary/Chest: No stridor. Tachypnea noted. He has decreased breath sounds. He  has no wheezes. He has no rhonchi. He has no rales. He exhibits no tenderness.  Abdominal: Soft. There is no tenderness. There is no rebound and no guarding.  Musculoskeletal: He exhibits no edema or tenderness.  Neurological: He is alert and oriented to person, place, and time. No cranial nerve deficit or sensory deficit. He exhibits normal muscle tone.  Skin: Skin is warm. Capillary refill takes less than 2 seconds. No rash noted. He is not diaphoretic. No erythema. There is pallor.  Psychiatric: He has a normal mood and affect.  Nursing note and vitals reviewed.    ED Treatments / Results  Labs (all labs ordered are listed, but only abnormal results are displayed) Labs Reviewed  BASIC METABOLIC PANEL - Abnormal; Notable for the following:       Result Value   Glucose, Bld 108 (*)    BUN 26 (*)    GFR calc non Af Amer 59 (*)    All other components within normal limits  CBC - Abnormal; Notable for the following:    RBC 2.28 (*)    Hemoglobin 4.2 (*)    HCT 15.1 (*)     MCV 66.2 (*)    MCH 18.4 (*)    MCHC 27.8 (*)    RDW 21.2 (*)    All other components within normal limits  BRAIN NATRIURETIC PEPTIDE - Abnormal; Notable for the following:    B Natriuretic Peptide 343.5 (*)    All other components within normal limits  HEPATIC FUNCTION PANEL - Abnormal; Notable for the following:    Total Protein 6.3 (*)    Bilirubin, Direct <0.1 (*)    All other components within normal limits  CBC - Abnormal; Notable for the following:    RBC 2.28 (*)    Hemoglobin 4.2 (*)    HCT 15.1 (*)    MCV 66.2 (*)    MCH 18.4 (*)    MCHC 27.8 (*)    RDW 21.0 (*)    All other components within normal limits  TROPONIN I - Abnormal; Notable for the following:    Troponin I 0.17 (*)    All other components within normal limits  TROPONIN I - Abnormal; Notable for the following:    Troponin I 0.22 (*)    All other components within normal limits  BASIC METABOLIC PANEL - Abnormal; Notable for the following:    Glucose, Bld 106 (*)    BUN 23 (*)    Calcium 8.5 (*)    All other components within normal limits  RETICULOCYTES - Abnormal; Notable for the following:    RBC. 3.26 (*)    All other components within normal limits  IRON AND TIBC - Abnormal; Notable for the following:    Iron 234 (*)    Saturation Ratios 67 (*)    All other components within normal limits  FERRITIN - Abnormal; Notable for the following:    Ferritin 11 (*)    All other components within normal limits  GLUCOSE, CAPILLARY - Abnormal; Notable for the following:    Glucose-Capillary 118 (*)    All other components within normal limits  CBG MONITORING, ED - Abnormal; Notable for the following:    Glucose-Capillary 105 (*)    All other components within normal limits  I-STAT TROPOININ, ED - Abnormal; Notable for the following:    Troponin i, poc 0.17 (*)    All other components within normal limits  CULTURE, BLOOD (ROUTINE X 2)  CULTURE,  BLOOD (ROUTINE X 2)  MRSA PCR SCREENING  URINALYSIS,  ROUTINE W REFLEX MICROSCOPIC  D-DIMER, QUANTITATIVE (NOT AT [facility])  LIPASE, BLOOD  LACTATE DEHYDROGENASE  SAVE SMEAR  PROTIME-INR  APTT  FOLATE  PROCALCITONIN  VITAMIN B12  PATHOLOGIST SMEAR REVIEW  HAPTOGLOBIN  HEMOGLOBIN A1C  LIPID PANEL  I-STAT CG4 LACTIC ACID, ED  POC OCCULT BLOOD, ED  I-STAT CG4 LACTIC ACID, ED  PREPARE RBC (CROSSMATCH)  TYPE AND SCREEN  PREPARE RBC (CROSSMATCH)  ABO/RH    EKG  EKG Interpretation  Date/Time:  Wednesday December 19 2016 17:30:28 EDT Ventricular Rate:  90 PR Interval:    QRS Duration: 88 QT Interval:  363 QTC Calculation: 445 R Axis:   76 Text Interpretation:  Sinus rhythm Short PR interval Low voltage, extremity leads When compared to prior, no significant changes. No STEMI.  Confirmed by Sherry Ruffing MD, [place] [id number]) on 12/20/2016 12:33:58 PM       Radiology Ct Abdomen Pelvis Wo Contrast  Result Date: 12/19/2016 CLINICAL DATA:  Fall.  Anemia. EXAM: CT ABDOMEN AND PELVIS WITHOUT CONTRAST TECHNIQUE: Multidetector CT imaging of the abdomen and pelvis was performed following the standard protocol without IV contrast. COMPARISON:  03/12/2016 FINDINGS: Lower chest: Large hiatal hernia. Heart is normal size. Trace bilateral pleural effusions. Left lower lobe atelectasis. Hepatobiliary: No focal hepatic abnormality. Gallbladder unremarkable. Pancreas: No focal abnormality or ductal dilatation. Spleen: No focal abnormality.  Normal size. Adrenals/Urinary Tract: No adrenal abnormality. No focal renal abnormality. No stones or hydronephrosis. Urinary bladder is unremarkable. Stomach/Bowel: Moderate stool burden throughout the colon. No evidence of obstruction. Vascular/Lymphatic: Aortic and iliac calcifications. No aneurysm or adenopathy. Reproductive: No visible focal abnormality. Other: No free fluid or free air.  No retroperitoneal hematoma. Musculoskeletal: No acute bony abnormality. IMPRESSION: Moderate stool burden throughout the colon. Trace  bilateral pleural effusions.  Left lower lobe atelectasis. Large hiatal hernia. No acute findings in the abdomen or pelvis. No retroperitoneal hematoma. Electronically Signed   By: Rolm Baptise M.D.   On: 12/19/2016 20:06   Dg Chest 2 View  Result Date: 12/19/2016 CLINICAL DATA:  Unwitnessed fall in bathroom. Shortness of breath and hypoxia. EXAM: CHEST  2 VIEW COMPARISON:  Chest radiograph March 21, 2016 FINDINGS: Cardiac silhouette is mildly enlarged. Tortuous calcified aorta. Interstitial and alveolar airspace opacities for 7 prior imaging. Small probable LEFT pleural effusion. No pneumothorax. Patient rotated LEFT. Old LEFT humerus fracture. Large hiatal hernia. IMPRESSION: Increasing interstitial and alveolar airspace opacities associated with pulmonary edema and/or pneumonia. Stable cardiomegaly. Electronically Signed   By: Elon Alas M.D.   On: 12/19/2016 18:34   Ct Head Wo Contrast  Result Date: 12/19/2016 CLINICAL DATA:  Initial evaluation for acute trauma, unwitnessed fall. EXAM: CT HEAD WITHOUT CONTRAST CT CERVICAL SPINE WITHOUT CONTRAST TECHNIQUE: Multidetector CT imaging of the head and cervical spine was performed following the standard protocol without intravenous contrast. Multiplanar CT image reconstructions of the cervical spine were also generated. COMPARISON:  Prior CT from 07/08/2016. FINDINGS: CT HEAD FINDINGS Brain: Mild for age cerebral atrophy with moderate chronic microvascular ischemic disease, stable. No acute intracranial hemorrhage. No evidence for acute large vessel territory infarct. No mass lesion, midline shift or mass effect. No hydrocephalus. No extra-axial fluid collection. Vascular: No hyperdense vessel. Scattered vascular calcifications noted within the carotid siphons. Skull: Scalp soft tissues demonstrate no acute abnormality. Calvarium intact. Sinuses/Orbits: Globes and orbital soft tissues within normal limits. Paranasal sinuses and mastoids are clear. CT  CERVICAL SPINE FINDINGS Alignment: Straightening of  the normal cervical lordosis. Trace anterolisthesis of C3 on C4, with trace retrolisthesis of C4 on C5. Skull base and vertebrae: Skullbase intact. C1-2 articulations are unchanged. Rotation of C1 on C2 likely positional. Dens is intact. Vertebral body heights maintained. The no acute fracture. Soft tissues and spinal canal: Visualized soft tissues of the neck demonstrate no acute abnormality. No prevertebral edema. Prominent vascular calcifications about the carotid bifurcations. Disc levels: Moderate to advanced multilevel degenerative spondylolysis, greatest at C4-5 and C5-6. Predominant right-sided facet arthrosis. Upper chest: Visualized upper chest demonstrates no acute abnormality. Partially visualized lung apices are grossly clear. Other: IMPRESSION: CT BRAIN: 1. No acute intracranial process. 2. Stable atrophy with chronic microvascular ischemic disease. CT CERVICAL SPINE: 1. No acute traumatic injury within cervical spine. 2. Stable alignment with advanced multilevel degenerative disc disease. Electronically Signed   By: Jeannine Boga M.D.   On: 12/19/2016 18:47   Ct Cervical Spine Wo Contrast  Result Date: 12/19/2016 CLINICAL DATA:  Initial evaluation for acute trauma, unwitnessed fall. EXAM: CT HEAD WITHOUT CONTRAST CT CERVICAL SPINE WITHOUT CONTRAST TECHNIQUE: Multidetector CT imaging of the head and cervical spine was performed following the standard protocol without intravenous contrast. Multiplanar CT image reconstructions of the cervical spine were also generated. COMPARISON:  Prior CT from 07/08/2016. FINDINGS: CT HEAD FINDINGS Brain: Mild for age cerebral atrophy with moderate chronic microvascular ischemic disease, stable. No acute intracranial hemorrhage. No evidence for acute large vessel territory infarct. No mass lesion, midline shift or mass effect. No hydrocephalus. No extra-axial fluid collection. Vascular: No hyperdense  vessel. Scattered vascular calcifications noted within the carotid siphons. Skull: Scalp soft tissues demonstrate no acute abnormality. Calvarium intact. Sinuses/Orbits: Globes and orbital soft tissues within normal limits. Paranasal sinuses and mastoids are clear. CT CERVICAL SPINE FINDINGS Alignment: Straightening of the normal cervical lordosis. Trace anterolisthesis of C3 on C4, with trace retrolisthesis of C4 on C5. Skull base and vertebrae: Skullbase intact. C1-2 articulations are unchanged. Rotation of C1 on C2 likely positional. Dens is intact. Vertebral body heights maintained. The no acute fracture. Soft tissues and spinal canal: Visualized soft tissues of the neck demonstrate no acute abnormality. No prevertebral edema. Prominent vascular calcifications about the carotid bifurcations. Disc levels: Moderate to advanced multilevel degenerative spondylolysis, greatest at C4-5 and C5-6. Predominant right-sided facet arthrosis. Upper chest: Visualized upper chest demonstrates no acute abnormality. Partially visualized lung apices are grossly clear. Other: IMPRESSION: CT BRAIN: 1. No acute intracranial process. 2. Stable atrophy with chronic microvascular ischemic disease. CT CERVICAL SPINE: 1. No acute traumatic injury within cervical spine. 2. Stable alignment with advanced multilevel degenerative disc disease. Electronically Signed   By: Jeannine Boga M.D.   On: 12/19/2016 18:47   Dg Chest Port 1 View  Result Date: 12/20/2016 CLINICAL DATA:  Pulmonary edema EXAM: PORTABLE CHEST 1 VIEW COMPARISON:  December 19, 2016 pain March 21, 2016 FINDINGS: There is new airspace consolidation in the right upper lobe, right perihilar region common right base, concerning for pneumonia. There is underlying diffuse interstitial prominence with nodular interstitial disease throughout the lungs diffusely. This appearance is stable. Heart is mildly enlarged with pulmonary vascular within normal limits. No adenopathy.  There is aortic atherosclerosis. No bone lesions. IMPRESSION: Suspect multifocal pneumonia on the right. Areas of consolidation are new compared to 1 day prior on the right. There is underlying nodular interstitial disease and diffuse interstitial prominence. While there may be a degree of pulmonary edema, the appearance suggests noncardiogenic etiology for these changes. A degree  of underlying diffuse pulmonary fibrosis is felt to be more likely. Heart remains slightly prominent with pulmonary vascularity within normal limits. There is aortic atherosclerosis. Electronically Signed   By: Lowella Grip III M.D.   On: 12/20/2016 08:59    Procedures Procedures (including critical care time)  CRITICAL CARE Performed by: Gwenyth Allegra Tegeler Total critical care time: 65 minutes Critical care time was exclusive of separately billable procedures and treating other patients. Critical care was necessary to treat or prevent imminent or life-threatening deterioration. Critical care was time spent personally by me on the following activities: development of treatment plan with patient and/or surrogate as well as nursing, discussions with consultants, evaluation of patient's response to treatment, examination of patient, obtaining history from patient or surrogate, ordering and performing treatments and interventions, ordering and review of laboratory studies, ordering and review of radiographic studies, pulse oximetry and re-evaluation of patient's condition.   Medications Ordered in ED Medications  loperamide (IMODIUM) capsule 2 mg (not administered)  lamoTRIgine (LAMICTAL) tablet 100 mg (100 mg Oral Given 12/20/16 0920)  senna-docusate (Senokot-S) tablet 1 tablet (not administered)  pantoprazole (PROTONIX) EC tablet 20 mg (20 mg Oral Given 12/20/16 0920)  ipratropium-albuterol (DUONEB) 0.5-2.5 (3) MG/3ML nebulizer solution 3 mL (3 mLs Nebulization Given 12/20/16 0931)  vitamin B-12 (CYANOCOBALAMIN)  tablet 2,000 mcg (2,000 mcg Oral Given 12/20/16 0920)  hydrALAZINE (APRESOLINE) injection 5 mg (not administered)  nitroGLYCERIN (NITROSTAT) SL tablet 0.4 mg (not administered)  sodium chloride flush (NS) 0.9 % injection 3 mL (3 mLs Intravenous Given 12/19/16 2141)  acetaminophen (TYLENOL) tablet 650 mg (not administered)    Or  acetaminophen (TYLENOL) suppository 650 mg (not administered)  ondansetron (ZOFRAN) tablet 4 mg (not administered)    Or  ondansetron (ZOFRAN) injection 4 mg (not administered)  furosemide (LASIX) injection 40 mg (40 mg Intravenous Given 12/20/16 0919)  lacosamide (VIMPAT) tablet 50 mg (50 mg Oral Given 12/20/16 0920)    And  lacosamide (VIMPAT) tablet 150 mg (150 mg Oral Given 12/19/16 2152)  piperacillin-tazobactam (ZOSYN) IVPB 3.375 g (3.375 g Intravenous New Bag/Given 12/20/16 0921)  docusate sodium (COLACE) capsule 100 mg (100 mg Oral Given 12/20/16 0920)  bisacodyl (DULCOLAX) suppository 10 mg (not administered)  0.9 %  sodium chloride infusion ( Intravenous New Bag/Given 12/19/16 2139)  furosemide (LASIX) injection 40 mg (40 mg Intravenous Given 12/19/16 2153)  furosemide (LASIX) injection 40 mg (40 mg Intravenous Given 12/20/16 0022)     Initial Impression / Assessment and Plan / ED Course  I have reviewed the triage vital signs and the nursing notes.  Pertinent labs & imaging results that were available during my care of the patient were reviewed by me and considered in my medical decision making (see chart for details).     Barry Dean is a 83 y.o. male A past medical history significant for Parkinson's, hypertension, seizures, atrial fibrillation, and frequent falls who presents with a fall. Patient brought in by EMS for an unwitnessed fall in the bathroom. Patient does not remember the fall but denies preceding symptoms. Patient is accompanied by family who reports that patient has been feeling generalized weakness. Patient also reports he has had some  shortness of breath and a dry cough. Patient denies fevers, chills, chest pain, nausea, vomiting, or any urinary symptoms. He denies changes in bowel movements.  History and exam are seen above.   On exam, patient had course breath sounds. Patient's abdomen was nontender. Patient is only alert to himself. Fecal occult  test was positive after hemoglobin returned at 4.2. Patient also found during workup to have evidence of pneumonia.  No evidence of had or C-spine injury on CT imaging.  Suspect patient has G.I. bleed. Patient given protonic's and will be given three units of blood in the emergency department. G.I. was called who will see the patient. Patient given broad-spectrum antibiotics for pneumonia.  Patient admitted for further management of somatic anemia, pneumonia, and his falls. Patient admitted in stable condition.   Final Clinical Impressions(s) / ED Diagnoses   Final diagnoses:  Symptomatic anemia  Community acquired pneumonia, unspecified laterality  Anemia     Clinical Impression: 1. Symptomatic anemia   2. Community acquired pneumonia, unspecified laterality   3. Anemia   4. Pulmonary edema     Disposition: Admit to Hospitalist service    Courtney Paris, MD 12/21/16 [phone]

## 2016-12-19 NOTE — Progress Notes (Signed)
CRITICAL VALUE ALERT  Critical value received: Troponin 0.17 previous 0.17   Date of notification:  12/19/2016  Time of notification:  2130  Critical value read back:Yes.    Nurse who received alert:  J.Khang Hannum  MD notified (1st page):  N/A  Time of first page:  N/A  MD notified (2nd page):  Time of second page:  Responding MD:  N/A  Time MD responded:  N/A

## 2016-12-20 ENCOUNTER — Inpatient Hospital Stay (HOSPITAL_COMMUNITY): Payer: Medicare Other

## 2016-12-20 DIAGNOSIS — J690 Pneumonitis due to inhalation of food and vomit: Secondary | ICD-10-CM

## 2016-12-20 DIAGNOSIS — I4891 Unspecified atrial fibrillation: Secondary | ICD-10-CM

## 2016-12-20 DIAGNOSIS — R7989 Other specified abnormal findings of blood chemistry: Secondary | ICD-10-CM

## 2016-12-20 DIAGNOSIS — J9600 Acute respiratory failure, unspecified whether with hypoxia or hypercapnia: Secondary | ICD-10-CM

## 2016-12-20 LAB — LIPID PANEL
CHOL/HDL RATIO: 2.6 ratio
Cholesterol: 90 mg/dL (ref 0–200)
HDL: 34 mg/dL — AB (ref >40)
LDL CALC: 43 mg/dL (ref 0–99)
Triglycerides: 64 mg/dL (ref <150)
VLDL: 13 mg/dL (ref 0–40)

## 2016-12-20 LAB — VITAMIN B12: Vitamin B-12: 497 pg/mL (ref 180–914)

## 2016-12-20 LAB — IRON AND TIBC
IRON: 234 ug/dL — AB (ref 45–182)
Saturation Ratios: 67 % — ABNORMAL HIGH (ref 17.9–39.5)
TIBC: 347 ug/dL (ref 250–450)
UIBC: 113 ug/dL

## 2016-12-20 LAB — GLUCOSE, CAPILLARY: GLUCOSE-CAPILLARY: 118 mg/dL — AB (ref 65–99)

## 2016-12-20 LAB — BASIC METABOLIC PANEL
Anion gap: 9 (ref 5–15)
BUN: 23 mg/dL — AB (ref 6–20)
CALCIUM: 8.5 mg/dL — AB (ref 8.9–10.3)
CHLORIDE: 102 mmol/L (ref 101–111)
CO2: 25 mmol/L (ref 22–32)
CREATININE: 1.04 mg/dL (ref 0.61–1.24)
GFR calc Af Amer: >60 mL/min (ref >60)
GFR calc non Af Amer: >60 mL/min (ref >60)
GLUCOSE: 106 mg/dL — AB (ref 65–99)
Potassium: 3.8 mmol/L (ref 3.5–5.1)
Sodium: 136 mmol/L (ref 135–145)

## 2016-12-20 LAB — RETICULOCYTES
RBC.: 3.26 MIL/uL — ABNORMAL LOW (ref 4.22–5.81)
RETIC COUNT ABSOLUTE: 71.7 10*3/uL (ref 19.0–186.0)
Retic Ct Pct: 2.2 % (ref 0.4–3.1)

## 2016-12-20 LAB — CBC
HCT: 23.5 % — ABNORMAL LOW (ref 39.0–52.0)
Hemoglobin: 7.7 g/dL — ABNORMAL LOW (ref 13.0–17.0)
MCH: 24.1 pg — ABNORMAL LOW (ref 26.0–34.0)
MCHC: 32.8 g/dL (ref 30.0–36.0)
MCV: 73.4 fL — ABNORMAL LOW (ref 78.0–100.0)
PLATELETS: 300 10*3/uL (ref 150–400)
RBC: 3.2 MIL/uL — AB (ref 4.22–5.81)
RDW: 23.3 % — ABNORMAL HIGH (ref 11.5–15.5)
WBC: 8.3 10*3/uL (ref 4.0–10.5)

## 2016-12-20 LAB — PROCALCITONIN: Procalcitonin: <0.1 ng/mL

## 2016-12-20 LAB — FOLATE: Folate: 20.9 ng/mL (ref >5.9)

## 2016-12-20 LAB — TROPONIN I: TROPONIN I: 0.22 ng/mL — AB (ref <0.03)

## 2016-12-20 LAB — ABO/RH: ABO/RH(D): A POS

## 2016-12-20 LAB — MRSA PCR SCREENING: MRSA by PCR: NEGATIVE

## 2016-12-20 LAB — FERRITIN: FERRITIN: 11 ng/mL — AB (ref 24–336)

## 2016-12-20 MED ORDER — DOCUSATE SODIUM 100 MG PO CAPS
100.0000 mg | ORAL_CAPSULE | Freq: Two times a day (BID) | ORAL | Status: DC
  Administered 2016-12-20 – 2016-12-23 (×7): 100 mg via ORAL
  Filled 2016-12-20 (×7): qty 1

## 2016-12-20 MED ORDER — IPRATROPIUM-ALBUTEROL 0.5-2.5 (3) MG/3ML IN SOLN
3.0000 mL | Freq: Three times a day (TID) | RESPIRATORY_TRACT | Status: DC
  Administered 2016-12-21: 3 mL via RESPIRATORY_TRACT
  Filled 2016-12-20: qty 3

## 2016-12-20 MED ORDER — PIPERACILLIN-TAZOBACTAM 3.375 G IVPB
3.3750 g | Freq: Three times a day (TID) | INTRAVENOUS | Status: DC
  Administered 2016-12-20 – 2016-12-22 (×7): 3.375 g via INTRAVENOUS
  Filled 2016-12-20 (×7): qty 50

## 2016-12-20 MED ORDER — BISACODYL 10 MG RE SUPP
10.0000 mg | Freq: Once | RECTAL | Status: AC
  Administered 2016-12-20: 10 mg via RECTAL
  Filled 2016-12-20: qty 1

## 2016-12-20 NOTE — Progress Notes (Signed)
PROGRESS NOTE  Barry Dean HUD:[id number] DOB: [birth year] DOA: 12/19/2016 PCP: [name], MD  Brief History54  83 year old male with a history of dementia, hypertension, paroxysmal atrial fibrillation, essential tremor, gelastic seizure disorder, and depression presents from skilled nursing facility secondary to an unwitnessed fall in the bathroom. The patient's seizure medications was just recently increased by his neurologist, Dr. [name] on 12/13/2016. His Lamictal was increased to 100 mg twice a day. Secondary to the patient's dementia, he is unable to provide any history. Apparently, he was found in the bathroom by the nursing staff at the skilled nursing facility. He was noted to be hypoxic. He was brought to the emergency department for further evaluation. Chest x-ray showed alveolar and interstitial opacities with lactic acid 1.4 and WBC 7.5. He had a hemoglobin of 4.2. The patient was transfused 3 units PRBC. The patient was not hypoxic and saturation 85% on room air.  Assessment/Plan: Acute respiratory failure with hypoxia -Presently stable on 55% Venturi mask-- oxygen saturation 96-98 percent -Secondary to pulmonary edema and aspiration pneumonia  -Wean oxygen for saturation greater than 92%   Acute on chronic diastolic CHF  -In part precipitated by the patient's blood loss anemia with hemoglobin 4.2  -The patient has received 2 doses of furosemide 40 mg IV  -He appears clinically euvolemic a.m. 12/20/2016  -Daily weights  -I/O not accurate as his condom catheter has dislodged  -Changed to oral furosemide  -Repeat chest x-ray  -03/14/2016 echo EF 60-65%, grade 2 DD, no WMA -Repeat echo  Aspiration pneumonia/Lobar Pneumonia -Start Zosyn  -There is a question whether patient truly had syncope  -Chek procalcitonin   Symptomatic anemia/acute blood loss anemia  -FOBT negative  -Baseline hemoglobin approximately 9-10  -CT abdomen and pelvis negative for  retroperitoneal hematoma  -LDH not suggestive of hemolysis  -Check iron studies   Gelastic seizures -follows Dr. [name] -continue home doses lamictal and vimpat -EEG -lamictal dose just increased on 12/13/16  PAF -CHADS-VASc = 4 -not AC candidate due to age, multiple comorbidities, and high fall risk  Hypertension -BP acceptable off amlodipine/benazepril -monitor  Constipation -4/25 CT abd--moderate stool burden -Discontinue Questran -Start Colace and Senokot    Disposition Plan:   SNF in 2-3days  Family Communication:   No Family at bedside  Consultants:  none  Code Status:   DNR  DVT Prophylaxis:  SCDs   Procedures: As Listed in Progress Note Above  Antibiotics: None    Subjective:  [place] not obtainable secondary to patient's dementia. He is not answering questions appropriately. No reported respiratory distress, vomiting, diarrhea. No reports of uncontrolled pain.  Objective: Vitals:   12/20/16 0500 12/20/16 0523 12/20/16 0600 12/20/16 0652  BP: (!) 116/44 134/65 (!) 105/43 (!) 151/99  Pulse: 77 82 71 99  Resp: 19 20 18    Temp:  98.9 F (37.2 C)    TempSrc:  Oral    SpO2: 97% 97% 98% 94%  Weight:      Height:        Intake/Output Summary (Last 24 hours) at 12/20/16 0758 Last data filed at 12/20/16 0523  Gross per 24 hour  Intake             1115 ml  Output             2200 ml  Net            -1085 ml   Weight change:  Exam:  General:  Pt is alert, follows commands appropriately, not in acute distress  HEENT: No icterus, No thrush, No neck mass, Barry/AT  Cardiovascular: RRR, S1/S2, no rubs, no gallops  Respiratory: CTA bilaterally, no wheezing, no crackles, no rhonchi  Abdomen: Soft/+BS, non tender, non distended, no guarding  Extremities: No edema, No lymphangitis, No petechiae, No rashes, no synovitis   Data Reviewed: I have personally reviewed following labs and imaging studies Basic Metabolic Panel:  Recent Labs Lab  12/19/16 1733  NA 137  K 4.8  CL 106  CO2 22  GLUCOSE 108*  BUN 26*  CREATININE 1.06  CALCIUM 9.1   Liver Function Tests:  Recent Labs Lab 12/19/16 1758  AST 31  ALT 18  ALKPHOS 72  BILITOT 0.4  PROT 6.3*  ALBUMIN 3.7    Recent Labs Lab 12/19/16 1758  LIPASE 20   No results for input(s): AMMONIA in the last 168 hours. Coagulation Profile:  Recent Labs Lab 12/19/16 2045  INR 1.13   CBC:  Recent Labs Lab 12/19/16 1733 12/19/16 2045  WBC 6.2 7.5  HGB 4.2* 4.2*  HCT 15.1* 15.1*  MCV 66.2* 66.2*  PLT 362 308   Cardiac Enzymes:  Recent Labs Lab 12/19/16 2045  TROPONINI 0.17*   BNP: Invalid input(s): POCBNP CBG:  Recent Labs Lab 12/19/16 1728  GLUCAP 105*   HbA1C: No results for input(s): HGBA1C in the last 72 hours. Urine analysis:    Component Value Date/Time   COLORURINE YELLOW 12/19/2016 2208   APPEARANCEUR CLEAR 12/19/2016 2208   LABSPEC 1.016 12/19/2016 2208   PHURINE 5.0 12/19/2016 2208   GLUCOSEU NEGATIVE 12/19/2016 2208   HGBUR NEGATIVE 12/19/2016 2208   BILIRUBINUR NEGATIVE 12/19/2016 2208   KETONESUR NEGATIVE 12/19/2016 2208   PROTEINUR NEGATIVE 12/19/2016 2208   UROBILINOGEN 1.0 11/12/2014 1132   NITRITE NEGATIVE 12/19/2016 2208   LEUKOCYTESUR NEGATIVE 12/19/2016 2208   Sepsis Labs: @LABRCNTIP (procalcitonin:4,lacticidven:4) ) Recent Results (from the past 240 hour(s))  Blood Culture (routine x 2)     Status: None (Preliminary result)   Collection Time: 12/19/16  5:58 PM  Result Value Ref Range Status   Specimen Description BLOOD LEFT ANTECUBITAL  Final   Special Requests   Final    BOTTLES DRAWN AEROBIC AND ANAEROBIC Blood Culture adequate volume Performed at [facility], [place] [address], [place], [place] [address]    Culture PENDING  Incomplete   Report Status PENDING  Incomplete  Blood Culture (routine x 2)     Status: None (Preliminary result)   Collection Time: 12/19/16  6:26 PM  Result Value Ref Range  Status   Specimen Description BLOOD BLOOD RIGHT FOREARM  Final   Special Requests   Final    BOTTLES DRAWN AEROBIC AND ANAEROBIC Blood Culture adequate volume Performed at [facility], [place] [address], [place], [place] [address]    Culture PENDING  Incomplete   Report Status PENDING  Incomplete  MRSA PCR Screening     Status: None   Collection Time: 12/19/16  8:46 PM  Result Value Ref Range Status   MRSA by PCR NEGATIVE NEGATIVE Final    Comment:        The GeneXpert MRSA Assay (FDA approved for NASAL specimens only), is one component of a comprehensive MRSA colonization surveillance program. It is not intended to diagnose MRSA infection nor to guide or monitor treatment for MRSA infections.      Scheduled Meds: . cholestyramine  4 g Oral 2 times  per day  . furosemide  40 mg Intravenous Daily  . ipratropium-albuterol  3 mL Nebulization Q6H  . lacosamide  50 mg Oral Daily   And  . lacosamide  150 mg Oral QHS  . lamoTRIgine  100 mg Oral BID  . pantoprazole  20 mg Oral Daily  . sodium chloride flush  3 mL Intravenous Q12H  . vitamin B-12  2,000 mcg Oral Daily   Continuous Infusions:  Procedures/Studies: Ct Abdomen Pelvis Wo Contrast  Result Date: 12/19/2016 CLINICAL DATA:  Fall.  Anemia. EXAM: CT ABDOMEN AND PELVIS WITHOUT CONTRAST TECHNIQUE: Multidetector CT imaging of the abdomen and pelvis was performed following the standard protocol without IV contrast. COMPARISON:  03/12/2016 FINDINGS: Lower chest: Large hiatal hernia. Heart is normal size. Trace bilateral pleural effusions. Left lower lobe atelectasis. Hepatobiliary: No focal hepatic abnormality. Gallbladder unremarkable. Pancreas: No focal abnormality or ductal dilatation. Spleen: No focal abnormality.  Normal size. Adrenals/Urinary Tract: No adrenal abnormality. No focal renal abnormality. No stones or hydronephrosis. Urinary bladder is unremarkable. Stomach/Bowel: Moderate stool burden throughout the colon. No  evidence of obstruction. Vascular/Lymphatic: Aortic and iliac calcifications. No aneurysm or adenopathy. Reproductive: No visible focal abnormality. Other: No free fluid or free air.  No retroperitoneal hematoma. Musculoskeletal: No acute bony abnormality. IMPRESSION: Moderate stool burden throughout the colon. Trace bilateral pleural effusions.  Left lower lobe atelectasis. Large hiatal hernia. No acute findings in the abdomen or pelvis. No retroperitoneal hematoma. Electronically Signed   By: Rolm Baptise M.D.   On: 12/19/2016 20:06   Dg Chest 2 View  Result Date: 12/19/2016 CLINICAL DATA:  Unwitnessed fall in bathroom. Shortness of breath and hypoxia. EXAM: CHEST  2 VIEW COMPARISON:  Chest radiograph March 21, 2016 FINDINGS: Cardiac silhouette is mildly enlarged. Tortuous calcified aorta. Interstitial and alveolar airspace opacities for 7 prior imaging. Small probable LEFT pleural effusion. No pneumothorax. Patient rotated LEFT. Old LEFT humerus fracture. Large hiatal hernia. IMPRESSION: Increasing interstitial and alveolar airspace opacities associated with pulmonary edema and/or pneumonia. Stable cardiomegaly. Electronically Signed   By: Elon Alas M.D.   On: 12/19/2016 18:34   Ct Head Wo Contrast  Result Date: 12/19/2016 CLINICAL DATA:  Initial evaluation for acute trauma, unwitnessed fall. EXAM: CT HEAD WITHOUT CONTRAST CT CERVICAL SPINE WITHOUT CONTRAST TECHNIQUE: Multidetector CT imaging of the head and cervical spine was performed following the standard protocol without intravenous contrast. Multiplanar CT image reconstructions of the cervical spine were also generated. COMPARISON:  Prior CT from 07/08/2016. FINDINGS: CT HEAD FINDINGS Brain: Mild for age cerebral atrophy with moderate chronic microvascular ischemic disease, stable. No acute intracranial hemorrhage. No evidence for acute large vessel territory infarct. No mass lesion, midline shift or mass effect. No hydrocephalus. No  extra-axial fluid collection. Vascular: No hyperdense vessel. Scattered vascular calcifications noted within the carotid siphons. Skull: Scalp soft tissues demonstrate no acute abnormality. Calvarium intact. Sinuses/Orbits: Globes and orbital soft tissues within normal limits. Paranasal sinuses and mastoids are clear. CT CERVICAL SPINE FINDINGS Alignment: Straightening of the normal cervical lordosis. Trace anterolisthesis of C3 on C4, with trace retrolisthesis of C4 on C5. Skull base and vertebrae: Skullbase intact. C1-2 articulations are unchanged. Rotation of C1 on C2 likely positional. Dens is intact. Vertebral body heights maintained. The no acute fracture. Soft tissues and spinal canal: Visualized soft tissues of the neck demonstrate no acute abnormality. No prevertebral edema. Prominent vascular calcifications about the carotid bifurcations. Disc levels: Moderate to advanced multilevel degenerative spondylolysis, greatest at C4-5 and C5-6. Predominant right-sided  facet arthrosis. Upper chest: Visualized upper chest demonstrates no acute abnormality. Partially visualized lung apices are grossly clear. Other: IMPRESSION: CT BRAIN: 1. No acute intracranial process. 2. Stable atrophy with chronic microvascular ischemic disease. CT CERVICAL SPINE: 1. No acute traumatic injury within cervical spine. 2. Stable alignment with advanced multilevel degenerative disc disease. Electronically Signed   By: Jeannine Boga M.D.   On: 12/19/2016 18:47   Ct Cervical Spine Wo Contrast  Result Date: 12/19/2016 CLINICAL DATA:  Initial evaluation for acute trauma, unwitnessed fall. EXAM: CT HEAD WITHOUT CONTRAST CT CERVICAL SPINE WITHOUT CONTRAST TECHNIQUE: Multidetector CT imaging of the head and cervical spine was performed following the standard protocol without intravenous contrast. Multiplanar CT image reconstructions of the cervical spine were also generated. COMPARISON:  Prior CT from 07/08/2016. FINDINGS: CT HEAD  FINDINGS Brain: Mild for age cerebral atrophy with moderate chronic microvascular ischemic disease, stable. No acute intracranial hemorrhage. No evidence for acute large vessel territory infarct. No mass lesion, midline shift or mass effect. No hydrocephalus. No extra-axial fluid collection. Vascular: No hyperdense vessel. Scattered vascular calcifications noted within the carotid siphons. Skull: Scalp soft tissues demonstrate no acute abnormality. Calvarium intact. Sinuses/Orbits: Globes and orbital soft tissues within normal limits. Paranasal sinuses and mastoids are clear. CT CERVICAL SPINE FINDINGS Alignment: Straightening of the normal cervical lordosis. Trace anterolisthesis of C3 on C4, with trace retrolisthesis of C4 on C5. Skull base and vertebrae: Skullbase intact. C1-2 articulations are unchanged. Rotation of C1 on C2 likely positional. Dens is intact. Vertebral body heights maintained. The no acute fracture. Soft tissues and spinal canal: Visualized soft tissues of the neck demonstrate no acute abnormality. No prevertebral edema. Prominent vascular calcifications about the carotid bifurcations. Disc levels: Moderate to advanced multilevel degenerative spondylolysis, greatest at C4-5 and C5-6. Predominant right-sided facet arthrosis. Upper chest: Visualized upper chest demonstrates no acute abnormality. Partially visualized lung apices are grossly clear. Other: IMPRESSION: CT BRAIN: 1. No acute intracranial process. 2. Stable atrophy with chronic microvascular ischemic disease. CT CERVICAL SPINE: 1. No acute traumatic injury within cervical spine. 2. Stable alignment with advanced multilevel degenerative disc disease. Electronically Signed   By: Jeannine Boga M.D.   On: 12/19/2016 18:47    Claudie Rathbone, DO  [facility] Pager [phone]  If 7PM-7AM, please contact night-coverage [email or web address] Password [email or web address] 12/20/2016, 7:58 AM   LOS: 1 day

## 2016-12-20 NOTE — Progress Notes (Signed)
Patient been pulling , moving, and shaking while getting EKG. Unable to get accurate EKG at this time.

## 2016-12-20 NOTE — Clinical Social Work Note (Signed)
Clinical Social Work Assessment  Patient Details  Name: Barry Dean MRN: [id number] Date of Birth: 07-28-1924  Date of referral:  12/20/16               Reason for consult:  Facility Placement, Discharge Planning                Permission sought to share information with:  Facility Art therapist granted to share information::  Yes, Verbal Permission Granted  Name::        Agency::     Relationship::     Contact Information:     Housing/Transportation Living arrangements for the past 2 months:  [place] of Information:  Adult Children Patient Interpreter Needed:  None Criminal Activity/Legal Involvement Pertinent to Current Situation/Hospitalization:  No - Comment as needed Significant Relationships:  Adult Children Lives with:  Facility Resident Do you feel safe going back to the place where you live?  Yes Need for family participation in patient care:  Yes (Comment)  Care giving concerns: No concerns reported by daughter at this time.   Social Worker assessment / plan:  Pt hospitalized on 12/19/16 from [facility]F with Symptomatic anemia. CSW met with pt's daughter at bedside to assist with d/c planning. Pt was sleeping during CSW visit. Daughter would like pt to return to [facility] at d/c. CSW has contacted ALF and clinicals sent for review. CSW will continue to follow to assist with d/c planning.  Employment status:  Retired Forensic scientist:  Medicare PT Recommendations:  Not assessed at this time Information / Referral to community resources:     Patient/Family's Response to care:  Daughter would like pt to return to [employer] [facility] at d/c.  Patient/Family's Understanding of and Emotional Response to Diagnosis, Current Treatment, and Prognosis: Daughter is hopeful that pt will return to baseline and return to ALF at d/c. Daughter appreciates CSW assistance with d/c planning.  Emotional  Assessment Appearance:  Appears stated age Attitude/Demeanor/Rapport:  Unable to Assess Affect (typically observed):  Unable to Assess Orientation:  Oriented to Self, Oriented to Place Alcohol / Substance use:  Not Applicable Psych involvement (Current and /or in the community):  No (Comment)  Discharge Needs  Concerns to be addressed:  Discharge Planning Concerns Readmission within the last 30 days:  No Current discharge risk:  None Barriers to Discharge:  No Barriers Identified   Luretha Rued, [place] 12/20/2016, 11:59 AM

## 2016-12-20 NOTE — Progress Notes (Signed)
Patient desat to 70-80% with venturi mask at 50% after the 1st unit was given. N.P. Notified ordered Lasik 40mg  IV. RN changed venturi to Non rebreather. Patient o2sat 90-95% sustaining with non rebreather. Will continue to monitor.

## 2016-12-20 NOTE — Care Management Note (Signed)
Case Management Note  Patient Details  Name: Barry Dean MRN: [id number] Date of Birth: Aug 09, 1924  Subjective/Objective:      PNA with resp failure/fi02 at 50%              Action/Plan: From [facility] Date:  December 20, 2016 Chart reviewed for concurrent status and case management needs. Will continue to follow patient progress. Discharge Planning: following for needs Expected discharge date: [id number] Velva Harman, BSN, [employer], [place]   Expected Discharge Date:   (unknown)               Expected Discharge Plan:  Assisted Living / Rest Home  In-House Referral:  Clinical Social Work  Discharge planning Services  CM Consult  Post Acute Care Choice:    Choice offered to:     DME Arranged:    DME Agency:     HH Arranged:    [place] Agency:     Status of Service:  In process, will continue to follow  If discussed at Long Length of Stay Meetings, dates discussed:    Additional Comments:  Leeroy Cha, RN 12/20/2016, 9:55 AM

## 2016-12-21 ENCOUNTER — Inpatient Hospital Stay (HOSPITAL_COMMUNITY)
Admit: 2016-12-21 | Discharge: 2016-12-21 | Disposition: A | Discharge status: Home / Self Care | Payer: Medicare Other | Attending: Internal Medicine | Admitting: Internal Medicine

## 2016-12-21 DIAGNOSIS — G40909 Epilepsy, unspecified, not intractable, without status epilepticus: Secondary | ICD-10-CM

## 2016-12-21 LAB — BPAM RBC
BLOOD PRODUCT EXPIRATION DATE: 201805112359
Blood Product Expiration Date: 201805102359
Blood Product Expiration Date: 201805102359
ISSUE DATE / TIME: 201804252110
ISSUE DATE / TIME: 201804260037
ISSUE DATE / TIME: 201804260303
UNIT TYPE AND RH: 6200
Unit Type and Rh: 6200
Unit Type and Rh: 6200

## 2016-12-21 LAB — CBC
HCT: 26.5 % — ABNORMAL LOW (ref 39.0–52.0)
HEMOGLOBIN: 8.2 g/dL — AB (ref 13.0–17.0)
MCH: 22.9 pg — ABNORMAL LOW (ref 26.0–34.0)
MCHC: 30.9 g/dL (ref 30.0–36.0)
MCV: 74 fL — ABNORMAL LOW (ref 78.0–100.0)
PLATELETS: 314 10*3/uL (ref 150–400)
RBC: 3.58 MIL/uL — AB (ref 4.22–5.81)
RDW: 23.6 % — ABNORMAL HIGH (ref 11.5–15.5)
WBC: 7.1 10*3/uL (ref 4.0–10.5)

## 2016-12-21 LAB — TYPE AND SCREEN
ABO/RH(D): A POS
ANTIBODY SCREEN: NEGATIVE
Unit division: 0
Unit division: 0
Unit division: 0

## 2016-12-21 LAB — BASIC METABOLIC PANEL
ANION GAP: 8 (ref 5–15)
BUN: 20 mg/dL (ref 6–20)
CALCIUM: 8.6 mg/dL — AB (ref 8.9–10.3)
CO2: 26 mmol/L (ref 22–32)
Chloride: 103 mmol/L (ref 101–111)
Creatinine, Ser: 1 mg/dL (ref 0.61–1.24)
Glucose, Bld: 95 mg/dL (ref 65–99)
POTASSIUM: 3.3 mmol/L — AB (ref 3.5–5.1)
SODIUM: 137 mmol/L (ref 135–145)

## 2016-12-21 LAB — HEMOGLOBIN A1C
HEMOGLOBIN A1C: 5.8 % — AB (ref 4.8–5.6)
MEAN PLASMA GLUCOSE: 120 mg/dL

## 2016-12-21 LAB — PATHOLOGIST SMEAR REVIEW

## 2016-12-21 LAB — HAPTOGLOBIN: Haptoglobin: 159 mg/dL (ref 34–200)

## 2016-12-21 LAB — GLUCOSE, CAPILLARY: GLUCOSE-CAPILLARY: 95 mg/dL (ref 65–99)

## 2016-12-21 MED ORDER — IPRATROPIUM-ALBUTEROL 0.5-2.5 (3) MG/3ML IN SOLN
3.0000 mL | Freq: Two times a day (BID) | RESPIRATORY_TRACT | Status: DC
  Administered 2016-12-21 – 2016-12-22 (×2): 3 mL via RESPIRATORY_TRACT
  Filled 2016-12-21 (×2): qty 3

## 2016-12-21 MED ORDER — POTASSIUM CHLORIDE CRYS ER 20 MEQ PO TBCR
20.0000 meq | EXTENDED_RELEASE_TABLET | Freq: Once | ORAL | Status: AC
  Administered 2016-12-21: 20 meq via ORAL
  Filled 2016-12-21: qty 1

## 2016-12-21 MED ORDER — FERROUS SULFATE 325 (65 FE) MG PO TABS
325.0000 mg | ORAL_TABLET | Freq: Two times a day (BID) | ORAL | Status: DC
  Administered 2016-12-21 – 2016-12-23 (×3): 325 mg via ORAL
  Filled 2016-12-21 (×3): qty 1

## 2016-12-21 NOTE — NC FL2 (Deleted)
[place] LEVEL OF CARE SCREENING TOOL     IDENTIFICATION  Patient Name: Barry Dean Birthdate: 06/23/1924 Sex: male Admission Date (Current Location): 12/19/2016  [facility] and [place] Number:  Herbalist and Address:  [facility],  [place] [address], [place]      Provider Number: [id number]  Attending Physician Name and Address:  Orson Eva, MD  Relative Name and Phone Number:       Current Level of Care: Hospital Recommended Level of Care: [place] Prior Approval Number:    Date Approved/Denied:   PASRR Number:    Discharge Plan: Other (Comment) (ALF)    Current Diagnoses: Patient Active Problem List   Diagnosis Date Noted  . Aspiration pneumonia of both lower lobes due to gastric secretions ([place]) 12/20/2016  . Symptomatic anemia 12/19/2016  . Fall 12/19/2016  . Elevated troponin 12/19/2016  . Acute on chronic diastolic heart failure ([place]) 03/22/2016  . Sepsis, unspecified organism ([place])   . Hypokalemia   . Hypomagnesemia   . Sepsis due to pneumonia ([place])   . Sepsis ([place]) 03/12/2016  . Nausea & vomiting 03/12/2016  . Diarrhea 03/12/2016  . Acute encephalopathy 03/12/2016  . Acute kidney injury ([place]) 03/12/2016  . Acute respiratory failure with hypoxia ([place]) 03/12/2016  . Septic shock ([place])   . Abnormality of gait 09/27/2015  . Tremor 09/27/2015  . Undiagnosed cardiac murmurs 03/06/2015  . Syncope and collapse 03/05/2015  . Parkinson's disease ([place]) 03/05/2015  . PAF (paroxysmal atrial fibrillation) ([place]) 03/05/2015  . Bradycardia 03/05/2015  . Deafness 03/05/2015  . Mild mitral regurgitation 03/05/2015  . Emesis, persistent 09/14/2014  . Benign essential HTN 09/14/2014  . Syncope 09/13/2013  . ?? Respiratory failure 09/13/2013  . Noninfectious gastroenteritis and colitis 11/12/2012  . Acute and chronic respiratory failure 11/12/2012  . Community acquired pneumonia 11/12/2012  . HCAP  (healthcare-associated pneumonia) 07/18/2011  . Seizure disorder ([place]) 07/18/2011  . HTN (hypertension) 07/18/2011  . Hyponatremia 07/18/2011    Orientation RESPIRATION BLADDER Height & Weight     Self  Normal Incontinent Weight: 180 lb 12.4 oz (82 kg) Height:  6\' 2"  (188 cm)  BEHAVIORAL SYMPTOMS/MOOD NEUROLOGICAL BOWEL NUTRITION STATUS    Convulsions/Seizures Incontinent Diet (NPO)  AMBULATORY STATUS COMMUNICATION OF NEEDS Skin   Limited Assist Verbally PU Stage and Appropriate Care                       Personal Care Assistance Level of Assistance  Bathing, Feeding, Dressing Bathing Assistance: Limited assistance Feeding assistance: Limited assistance Dressing Assistance: Limited assistance     Functional Limitations Info  Sight, Hearing Sight Info: Impaired Hearing Info: Impaired      SPECIAL CARE FACTORS FREQUENCY                       Contractures Contractures Info: Not present    Additional Factors Info  Code Status, Allergies Code Status Info: DNR Allergies Info: Topamax, Uroxatral Alfuzosin Hydrochloride, Valium           Current Medications (12/21/2016):  This is the current hospital active medication list Current Facility-Administered Medications  Medication Dose Route Frequency Provider Last Rate Last Dose  . acetaminophen (TYLENOL) tablet 650 mg  650 mg Oral Q6H PRN Ivor Costa, MD   650 mg at 12/20/16 1533   Or  . acetaminophen (TYLENOL) suppository 650 mg  650 mg Rectal Q6H PRN Ivor Costa, MD      .  docusate sodium (COLACE) capsule 100 mg  100 mg Oral BID Orson Eva, MD   100 mg at 12/21/16 1015  . furosemide (LASIX) injection 40 mg  40 mg Intravenous Daily Ivor Costa, MD   40 mg at 12/21/16 1015  . hydrALAZINE (APRESOLINE) injection 5 mg  5 mg Intravenous Q2H PRN Ivor Costa, MD      . ipratropium-albuterol (DUONEB) 0.5-2.5 (3) MG/3ML nebulizer solution 3 mL  3 mL Nebulization BID Orson Eva, MD      . lacosamide (VIMPAT) tablet 50 mg  50 mg  Oral Daily Minda Ditto, RPH   50 mg at 12/21/16 1015   And  . lacosamide (VIMPAT) tablet 150 mg  150 mg Oral QHS Minda Ditto, RPH   150 mg at 12/20/16 2250  . lamoTRIgine (LAMICTAL) tablet 100 mg  100 mg Oral BID Ivor Costa, MD   100 mg at 12/21/16 1015  . loperamide (IMODIUM) capsule 2 mg  2 mg Oral Q6H PRN Ivor Costa, MD      . nitroGLYCERIN (NITROSTAT) SL tablet 0.4 mg  0.4 mg Sublingual Q5 min PRN Ivor Costa, MD      . ondansetron [facility]) tablet 4 mg  4 mg Oral Q6H PRN Ivor Costa, MD       Or  . ondansetron [facility]) injection 4 mg  4 mg Intravenous Q6H PRN Ivor Costa, MD      . pantoprazole (PROTONIX) EC tablet 20 mg  20 mg Oral Daily Ivor Costa, MD   20 mg at 12/21/16 1015  . piperacillin-tazobactam (ZOSYN) IVPB 3.375 g  3.375 g Intravenous Q8H David Tat, MD 12.5 mL/hr at 12/21/16 0809 3.375 g at 12/21/16 0809  . senna-docusate (Senokot-S) tablet 1 tablet  1 tablet Oral QHS PRN Ivor Costa, MD      . sodium chloride flush (NS) 0.9 % injection 3 mL  3 mL Intravenous Q12H Ivor Costa, MD   3 mL at 12/20/16 2250  . vitamin B-12 (CYANOCOBALAMIN) tablet 2,000 mcg  2,000 mcg Oral Daily Ivor Costa, MD   2,000 mcg at 12/21/16 1015     Discharge Medications: Please see discharge summary for a list of discharge medications.  Relevant Imaging Results:  Relevant Lab Results:   Additional Information SSN [id number]  Princes Finger, Carmela Hurt D, [place]

## 2016-12-21 NOTE — Procedures (Signed)
ELECTROENCEPHALOGRAM REPORT  Date of Study: 12/21/2016  Patient's Name: Barry Dean MRN: [id number] Date of Birth: 07-31-1924  Referring Provider: Dr. Shanon Brow Tat  Clinical History: This is a 83 year old man with a history of gelastic seizures with unwitnessed fall.  Medications: lacosamide  50 mg Oral Daily lacosamide  150 mg Oral QHS lamoTRIgine  100 mg Oral BID ferrous sulfate  325 mg Oral BID WC ipratropium-albuterol  3 mL Nebulization BID pantoprazole  20 mg Oral Daily potassium chloride  20 mEq Oral Once sodium chloride flush  3 mL Intravenous Q12H vitamin B-12  2,000 mcg Oral Daily piperacillin-tazobactam  Technical Summary: A multichannel digital EEG recording measured by the international 10-20 system with electrodes applied with paste and impedances below 5000 ohms performed in our laboratory with EKG monitoring in an awake and drowsy patient.  Hyperventilation and photic stimulation were not performed.  The digital EEG was referentially recorded, reformatted, and digitally filtered in a variety of bipolar and referential montages for optimal display.    Description: The patient is awake and drowsy during the recording.  During brief period of wakefulness, there is a symmetric, medium voltage 7 Hz posterior dominant rhythm that attenuates with eye opening.  The record is symmetric.  During drowsiness, there is an increase in theta and delta slowing of the background. Deeper stages of sleep were not seen.  Hyperventilation and photic stimulation were not performed.  There were no epileptiform discharges or electrographic seizures seen.    EKG lead was unremarkable.  Impression: This awake and drowsy EEG is mildly abnormal due to slowing of the posterior dominant rhythm.  Clinical Correlation of the above findings indicates mild diffuse cerebral dysfunction that is nonspecific in etiology and may be seen with hypoxic/ischemic injury, toxic/metabolic encephalopathy,  neurodegenerative disorders, or medication effect. The absence of epileptiform discharges does not exclude a clinical diagnosis of epilepsy. Clinical correlation is advised.   Ellouise Newer, M.D.

## 2016-12-21 NOTE — Progress Notes (Signed)
PROGRESS NOTE  Barry Dean CZY:[id number] DOB: 22-Oct-1923 DOA: 12/19/2016 PCP: [name], MD  Brief History86  83 year old male with a history of dementia, hypertension, paroxysmal atrial fibrillation, essential tremor, gelastic seizure disorder, and depression presents from skilled nursing facility secondary to an unwitnessed fall in the bathroom. The patient's seizure medications was just recently increased by his neurologist, Dr. [name] on 12/13/2016. His Lamictal was increased to 100 mg twice a day. Secondary to the patient's dementia, he is unable to provide any history. Apparently, he was found in the bathroom by the nursing staff at the skilled nursing facility. He was noted to be hypoxic. He was brought to the emergency department for further evaluation. Chest x-ray showed alveolar and interstitial opacities with lactic acid 1.4 and WBC 7.5. He had a hemoglobin of 4.2. The patient was transfused 3 units PRBC. The patient was hypoxic and saturation 85% on room air in ED.  He was started on IV lasix and IV zosyn with clinical improvement  Assessment/Plan: Acute respiratory failure with hypoxia -Presently stable on 55% Venturi mask-- oxygen saturation 96-98 percent -weaned to Nasal cannula -Secondary to pulmonary edema and aspiration pneumonia  -Wean oxygen for saturation greater than 92%   Acute on chronic diastolic CHF  -In part precipitated by the patient's blood loss anemia with hemoglobin 4.2  -He appears clinically euvolemic a.m. 12/20/2016  -Daily weights  -I/O not accurate as his condom catheter has dislodged  -pt appears clinically euvolemic 4/27 -d/c IV lasix -12/20/16 personally reviewed chest x-ray--new RUL, RLL consolidation with persistent intersititial markings -03/14/2016 echo EF 60-65%, grade 2 DD, no WMA -Repeat echo  Aspiration pneumonia/Lobar Pneumonia -Continue Zosyn  -There is a question whether patient truly had syncope  -Check  procalcitonin--<0.10 -speech therapy eval -he is on "chopped" diet at [facility] -12/20/16 personally reviewed chest x-ray--new RUL, RLL consolidation with persistent -suspect he may have underlying ILD  Symptomatic anemia/acute blood loss anemia  -FOBT negative  -Baseline hemoglobin approximately 9-10  -CT abdomen and pelvis negative for retroperitoneal hematoma  -LDH not suggestive of hemolysis  -Check iron studies--suggest anemia of chronic disease although done after blood transfusion -start oral iron  Gelastic seizures -follows Dr. [name] -continue home doses lamictal and vimpat -EEG -lamictal dose just increased on 12/13/16  PAF -CHADS-VASc = 4 -not AC candidate due to age, multiple comorbidities, and high fall risk  Hypertension -BP acceptable off amlodipine/benazepril -monitor  Constipation -4/25 CT abd--moderate stool burden -Discontinue Questran and imodium -Continue Colace and Senokot  Hypokalemia -replete -check mag -am BMP  Elevated troponin -secondary to demand ischemia -no anginal symptoms -personally reviewed EKG-sinus with nonspecfic ST changes    Disposition Plan:   [name] 4/28 if stable Family Communication:   Daughter updated at bedside 4/27--Total time spent 35 minutes.  Greater than 50% spent face to face counseling and coordinating care.   Consultants:  none  Code Status:   DNR  DVT Prophylaxis:  SCDs   Procedures: As Listed in Progress Note Above  Antibiotics: Zosyn 4/26>>>    Subjective: Patient denies fevers, chills, headache, chest pain, dyspnea, nausea, vomiting, diarrhea, abdominal pain, dysuria, hematuria, hematochezia, and melena.   Objective: Vitals:   12/20/16 2140 12/21/16 0500 12/21/16 0917 12/21/16 1013  BP: 134/65 (!) 144/66  (!) 132/43  Pulse: 77 94  84  Resp: 18 18    Temp: 98.5 F (36.9 C) 97.8 F (36.6 C)    TempSrc: Oral Oral  SpO2: 95% 95% 98% 92%  Weight: 82 kg (180  lb 12.4 oz)     Height:        Intake/Output Summary (Last 24 hours) at 12/21/16 1139 Last data filed at 12/21/16 1110  Gross per 24 hour  Intake              100 ml  Output              100 ml  Net                0 ml   Weight change: -5.998 kg (-13 lb 3.6 oz) Exam:   General:  Pt is alert, follows commands appropriately, not in acute distress  HEENT: No icterus, No thrush, No neck mass, [place]/AT  Cardiovascular: RRR, S1/S2, no rubs, no gallops  Respiratory: Scattered bilateral crackles without wheezing. Good air movement.  Abdomen: Soft/+BS, non tender, non distended, no guarding  Extremities: No edema, No lymphangitis, No petechiae, No rashes, no synovitis   Data Reviewed: I have personally reviewed following labs and imaging studies Basic Metabolic Panel:  Recent Labs Lab 12/19/16 1733 12/20/16 0815 12/21/16 0543  NA 137 136 137  K 4.8 3.8 3.3*  CL 106 102 103  CO2 22 25 26   GLUCOSE 108* 106* 95  BUN 26* 23* 20  CREATININE 1.06 1.04 1.00  CALCIUM 9.1 8.5* 8.6*   Liver Function Tests:  Recent Labs Lab 12/19/16 1758  AST 31  ALT 18  ALKPHOS 72  BILITOT 0.4  PROT 6.3*  ALBUMIN 3.7    Recent Labs Lab 12/19/16 1758  LIPASE 20   No results for input(s): AMMONIA in the last 168 hours. Coagulation Profile:  Recent Labs Lab 12/19/16 2045  INR 1.13   CBC:  Recent Labs Lab 12/19/16 1733 12/19/16 2045 12/20/16 0800  WBC 6.2 7.5 8.3  HGB 4.2* 4.2* 7.7*  HCT 15.1* 15.1* 23.5*  MCV 66.2* 66.2* 73.4*  PLT 362 308 300   Cardiac Enzymes:  Recent Labs Lab 12/19/16 2045 12/20/16 0815  TROPONINI 0.17* 0.22*   BNP: Invalid input(s): POCBNP CBG:  Recent Labs Lab 12/19/16 1728 12/20/16 0739 12/21/16 0756  GLUCAP 105* 118* 95   HbA1C: No results for input(s): HGBA1C in the last 72 hours. Urine analysis:    Component Value Date/Time   COLORURINE YELLOW 12/19/2016 2208   APPEARANCEUR CLEAR 12/19/2016 2208   LABSPEC 1.016 12/19/2016  2208   PHURINE 5.0 12/19/2016 2208   GLUCOSEU NEGATIVE 12/19/2016 2208   HGBUR NEGATIVE 12/19/2016 2208   BILIRUBINUR NEGATIVE 12/19/2016 2208   KETONESUR NEGATIVE 12/19/2016 2208   PROTEINUR NEGATIVE 12/19/2016 2208   UROBILINOGEN 1.0 11/12/2014 1132   NITRITE NEGATIVE 12/19/2016 2208   LEUKOCYTESUR NEGATIVE 12/19/2016 2208   Sepsis Labs: @LABRCNTIP (procalcitonin:4,lacticidven:4) ) Recent Results (from the past 240 hour(s))  Blood Culture (routine x 2)     Status: None (Preliminary result)   Collection Time: 12/19/16  5:58 PM  Result Value Ref Range Status   Specimen Description BLOOD LEFT ANTECUBITAL  Final   Special Requests   Final    BOTTLES DRAWN AEROBIC AND ANAEROBIC Blood Culture adequate volume   Culture   Final    NO GROWTH 2 DAYS Performed at [facility], [place] [address], [place], [place] [address]    Report Status PENDING  Incomplete  Blood Culture (routine x 2)     Status: None (Preliminary result)   Collection Time: 12/19/16  6:26 PM  Result Value Ref  Range Status   Specimen Description BLOOD BLOOD RIGHT FOREARM  Final   Special Requests   Final    BOTTLES DRAWN AEROBIC AND ANAEROBIC Blood Culture adequate volume   Culture   Final    NO GROWTH 2 DAYS Performed at [facility], [address] [place], [place] [address]    Report Status PENDING  Incomplete  MRSA PCR Screening     Status: None   Collection Time: 12/19/16  8:46 PM  Result Value Ref Range Status   MRSA by PCR NEGATIVE NEGATIVE Final    Comment:        The GeneXpert MRSA Assay (FDA approved for NASAL specimens only), is one component of a comprehensive MRSA colonization surveillance program. It is not intended to diagnose MRSA infection nor to guide or monitor treatment for MRSA infections.      Scheduled Meds: . docusate sodium  100 mg Oral BID  . ferrous sulfate  325 mg Oral BID WC  . ipratropium-albuterol  3 mL Nebulization BID  . lacosamide  50 mg Oral Daily   And    . lacosamide  150 mg Oral QHS  . lamoTRIgine  100 mg Oral BID  . pantoprazole  20 mg Oral Daily  . potassium chloride  20 mEq Oral Once  . sodium chloride flush  3 mL Intravenous Q12H  . vitamin B-12  2,000 mcg Oral Daily   Continuous Infusions: . piperacillin-tazobactam 3.375 g (12/21/16 0809)    Procedures/Studies: Ct Abdomen Pelvis Wo Contrast  Result Date: 12/19/2016 CLINICAL DATA:  Fall.  Anemia. EXAM: CT ABDOMEN AND PELVIS WITHOUT CONTRAST TECHNIQUE: Multidetector CT imaging of the abdomen and pelvis was performed following the standard protocol without IV contrast. COMPARISON:  03/12/2016 FINDINGS: Lower chest: Large hiatal hernia. Heart is normal size. Trace bilateral pleural effusions. Left lower lobe atelectasis. Hepatobiliary: No focal hepatic abnormality. Gallbladder unremarkable. Pancreas: No focal abnormality or ductal dilatation. Spleen: No focal abnormality.  Normal size. Adrenals/Urinary Tract: No adrenal abnormality. No focal renal abnormality. No stones or hydronephrosis. Urinary bladder is unremarkable. Stomach/Bowel: Moderate stool burden throughout the colon. No evidence of obstruction. Vascular/Lymphatic: Aortic and iliac calcifications. No aneurysm or adenopathy. Reproductive: No visible focal abnormality. Other: No free fluid or free air.  No retroperitoneal hematoma. Musculoskeletal: No acute bony abnormality. IMPRESSION: Moderate stool burden throughout the colon. Trace bilateral pleural effusions.  Left lower lobe atelectasis. Large hiatal hernia. No acute findings in the abdomen or pelvis. No retroperitoneal hematoma. Electronically Signed   By: Rolm Baptise M.D.   On: 12/19/2016 20:06   Dg Chest 2 View  Result Date: 12/19/2016 CLINICAL DATA:  Unwitnessed fall in bathroom. Shortness of breath and hypoxia. EXAM: CHEST  2 VIEW COMPARISON:  Chest radiograph March 21, 2016 FINDINGS: Cardiac silhouette is mildly enlarged. Tortuous calcified aorta. Interstitial and alveolar  airspace opacities for 7 prior imaging. Small probable LEFT pleural effusion. No pneumothorax. Patient rotated LEFT. Old LEFT humerus fracture. Large hiatal hernia. IMPRESSION: Increasing interstitial and alveolar airspace opacities associated with pulmonary edema and/or pneumonia. Stable cardiomegaly. Electronically Signed   By: Elon Alas M.D.   On: 12/19/2016 18:34   Ct Head Wo Contrast  Result Date: 12/19/2016 CLINICAL DATA:  Initial evaluation for acute trauma, unwitnessed fall. EXAM: CT HEAD WITHOUT CONTRAST CT CERVICAL SPINE WITHOUT CONTRAST TECHNIQUE: Multidetector CT imaging of the head and cervical spine was performed following the standard protocol without intravenous contrast. Multiplanar CT image reconstructions of the cervical spine were also generated.  COMPARISON:  Prior CT from 07/08/2016. FINDINGS: CT HEAD FINDINGS Brain: Mild for age cerebral atrophy with moderate chronic microvascular ischemic disease, stable. No acute intracranial hemorrhage. No evidence for acute large vessel territory infarct. No mass lesion, midline shift or mass effect. No hydrocephalus. No extra-axial fluid collection. Vascular: No hyperdense vessel. Scattered vascular calcifications noted within the carotid siphons. Skull: Scalp soft tissues demonstrate no acute abnormality. Calvarium intact. Sinuses/Orbits: Globes and orbital soft tissues within normal limits. Paranasal sinuses and mastoids are clear. CT CERVICAL SPINE FINDINGS Alignment: Straightening of the normal cervical lordosis. Trace anterolisthesis of C3 on C4, with trace retrolisthesis of C4 on C5. Skull base and vertebrae: Skullbase intact. C1-2 articulations are unchanged. Rotation of C1 on C2 likely positional. Dens is intact. Vertebral body heights maintained. The no acute fracture. Soft tissues and spinal canal: Visualized soft tissues of the neck demonstrate no acute abnormality. No prevertebral edema. Prominent vascular calcifications about the  carotid bifurcations. Disc levels: Moderate to advanced multilevel degenerative spondylolysis, greatest at C4-5 and C5-6. Predominant right-sided facet arthrosis. Upper chest: Visualized upper chest demonstrates no acute abnormality. Partially visualized lung apices are grossly clear. Other: IMPRESSION: CT BRAIN: 1. No acute intracranial process. 2. Stable atrophy with chronic microvascular ischemic disease. CT CERVICAL SPINE: 1. No acute traumatic injury within cervical spine. 2. Stable alignment with advanced multilevel degenerative disc disease. Electronically Signed   By: Jeannine Boga M.D.   On: 12/19/2016 18:47   Ct Cervical Spine Wo Contrast  Result Date: 12/19/2016 CLINICAL DATA:  Initial evaluation for acute trauma, unwitnessed fall. EXAM: CT HEAD WITHOUT CONTRAST CT CERVICAL SPINE WITHOUT CONTRAST TECHNIQUE: Multidetector CT imaging of the head and cervical spine was performed following the standard protocol without intravenous contrast. Multiplanar CT image reconstructions of the cervical spine were also generated. COMPARISON:  Prior CT from 07/08/2016. FINDINGS: CT HEAD FINDINGS Brain: Mild for age cerebral atrophy with moderate chronic microvascular ischemic disease, stable. No acute intracranial hemorrhage. No evidence for acute large vessel territory infarct. No mass lesion, midline shift or mass effect. No hydrocephalus. No extra-axial fluid collection. Vascular: No hyperdense vessel. Scattered vascular calcifications noted within the carotid siphons. Skull: Scalp soft tissues demonstrate no acute abnormality. Calvarium intact. Sinuses/Orbits: Globes and orbital soft tissues within normal limits. Paranasal sinuses and mastoids are clear. CT CERVICAL SPINE FINDINGS Alignment: Straightening of the normal cervical lordosis. Trace anterolisthesis of C3 on C4, with trace retrolisthesis of C4 on C5. Skull base and vertebrae: Skullbase intact. C1-2 articulations are unchanged. Rotation of C1 on  C2 likely positional. Dens is intact. Vertebral body heights maintained. The no acute fracture. Soft tissues and spinal canal: Visualized soft tissues of the neck demonstrate no acute abnormality. No prevertebral edema. Prominent vascular calcifications about the carotid bifurcations. Disc levels: Moderate to advanced multilevel degenerative spondylolysis, greatest at C4-5 and C5-6. Predominant right-sided facet arthrosis. Upper chest: Visualized upper chest demonstrates no acute abnormality. Partially visualized lung apices are grossly clear. Other: IMPRESSION: CT BRAIN: 1. No acute intracranial process. 2. Stable atrophy with chronic microvascular ischemic disease. CT CERVICAL SPINE: 1. No acute traumatic injury within cervical spine. 2. Stable alignment with advanced multilevel degenerative disc disease. Electronically Signed   By: Jeannine Boga M.D.   On: 12/19/2016 18:47   Dg Chest Port 1 View  Result Date: 12/20/2016 CLINICAL DATA:  Pulmonary edema EXAM: PORTABLE CHEST 1 VIEW COMPARISON:  December 19, 2016 pain March 21, 2016 FINDINGS: There is new airspace consolidation in the right upper lobe, right perihilar  region common right base, concerning for pneumonia. There is underlying diffuse interstitial prominence with nodular interstitial disease throughout the lungs diffusely. This appearance is stable. Heart is mildly enlarged with pulmonary vascular within normal limits. No adenopathy. There is aortic atherosclerosis. No bone lesions. IMPRESSION: Suspect multifocal pneumonia on the right. Areas of consolidation are new compared to 1 day prior on the right. There is underlying nodular interstitial disease and diffuse interstitial prominence. While there may be a degree of pulmonary edema, the appearance suggests noncardiogenic etiology for these changes. A degree of underlying diffuse pulmonary fibrosis is felt to be more likely. Heart remains slightly prominent with pulmonary vascularity within  normal limits. There is aortic atherosclerosis. Electronically Signed   By: Lowella Grip III M.D.   On: 12/20/2016 08:59    Habeeb Puertas, DO  [facility] Pager [phone]  If 7PM-7AM, please contact night-coverage [email or web address] Password [facility] 12/21/2016, 11:39 AM   LOS: 2 days

## 2016-12-21 NOTE — NC FL2 (Signed)
[place] LEVEL OF CARE SCREENING TOOL     IDENTIFICATION  Patient Name: Barry Dean Birthdate: 1924/01/11 Sex: male Admission Date (Current Location): 12/19/2016  [name] and [place] Number:  Herbalist and Address:  [facility],  [place] [address], [place]      Provider Number: [id number]  Attending Physician Name and Address:  Orson Eva, MD  Relative Name and Phone Number:       Current Level of Care: Hospital Recommended Level of Care: [place] Prior Approval Number:    Date Approved/Denied:   PASRR Number:    Discharge Plan: Other (Comment) (ALF)    Current Diagnoses: Patient Active Problem List   Diagnosis Date Noted  . Aspiration pneumonia of both lower lobes due to gastric secretions ([place]) 12/20/2016  . Symptomatic anemia 12/19/2016  . Fall 12/19/2016  . Elevated troponin 12/19/2016  . Acute on chronic diastolic heart failure ([place]) 03/22/2016  . Sepsis, unspecified organism ([place])   . Hypokalemia   . Hypomagnesemia   . Sepsis due to pneumonia ([place])   . Sepsis ([place]) 03/12/2016  . Nausea & vomiting 03/12/2016  . Diarrhea 03/12/2016  . Acute encephalopathy 03/12/2016  . Acute kidney injury ([place]) 03/12/2016  . Acute respiratory failure with hypoxia ([place]) 03/12/2016  . Septic shock ([place])   . Abnormality of gait 09/27/2015  . Tremor 09/27/2015  . Undiagnosed cardiac murmurs 03/06/2015  . Syncope and collapse 03/05/2015  . Parkinson's disease ([place]) 03/05/2015  . PAF (paroxysmal atrial fibrillation) ([place]) 03/05/2015  . Bradycardia 03/05/2015  . Deafness 03/05/2015  . Mild mitral regurgitation 03/05/2015  . Emesis, persistent 09/14/2014  . Benign essential HTN 09/14/2014  . Syncope 09/13/2013  . ?? Respiratory failure 09/13/2013  . Noninfectious gastroenteritis and colitis 11/12/2012  . Acute and chronic respiratory failure 11/12/2012  . Community acquired pneumonia 11/12/2012  . HCAP  (healthcare-associated pneumonia) 07/18/2011  . Seizure disorder ([place]) 07/18/2011  . HTN (hypertension) 07/18/2011  . Hyponatremia 07/18/2011    Orientation RESPIRATION BLADDER Height & Weight     Self  Normal Incontinent Weight: 180 lb 12.4 oz (82 kg) Height:  6\' 2"  (188 cm)  BEHAVIORAL SYMPTOMS/MOOD NEUROLOGICAL BOWEL NUTRITION STATUS    Convulsions/Seizures Incontinent Diet (NPO)  AMBULATORY STATUS COMMUNICATION OF NEEDS Skin   Limited Assist Verbally PU Stage and Appropriate Care                       Personal Care Assistance Level of Assistance  Bathing, Feeding, Dressing Bathing Assistance: Limited assistance Feeding assistance: Limited assistance Dressing Assistance: Limited assistance     Functional Limitations Info  Sight, Hearing Sight Info: Impaired Hearing Info: Impaired      SPECIAL CARE FACTORS FREQUENCY                       Contractures Contractures Info: Not present    Additional Factors Info  Code Status, Allergies  Contact Precautions Code Status Info: DNR Allergies Info: Topamax, Uroxatral Alfuzosin Hydrochloride, Valium         MRSA   Current Medications (12/21/2016):  This is the current hospital active medication list Current Facility-Administered Medications  Medication Dose Route Frequency Provider Last Rate Last Dose  . acetaminophen (TYLENOL) tablet 650 mg  650 mg Oral Q6H PRN Ivor Costa, MD   650 mg at 12/20/16 1533   Or  . acetaminophen (TYLENOL) suppository 650 mg  650 mg Rectal Q6H  PRN Ivor Costa, MD      . docusate sodium (COLACE) capsule 100 mg  100 mg Oral BID Orson Eva, MD   100 mg at 12/21/16 1015  . furosemide (LASIX) injection 40 mg  40 mg Intravenous Daily Ivor Costa, MD   40 mg at 12/21/16 1015  . hydrALAZINE (APRESOLINE) injection 5 mg  5 mg Intravenous Q2H PRN Ivor Costa, MD      . ipratropium-albuterol (DUONEB) 0.5-2.5 (3) MG/3ML nebulizer solution 3 mL  3 mL Nebulization BID Orson Eva, MD      . lacosamide  (VIMPAT) tablet 50 mg  50 mg Oral Daily Minda Ditto, RPH   50 mg at 12/21/16 1015   And  . lacosamide (VIMPAT) tablet 150 mg  150 mg Oral QHS Minda Ditto, RPH   150 mg at 12/20/16 2250  . lamoTRIgine (LAMICTAL) tablet 100 mg  100 mg Oral BID Ivor Costa, MD   100 mg at 12/21/16 1015  . loperamide (IMODIUM) capsule 2 mg  2 mg Oral Q6H PRN Ivor Costa, MD      . nitroGLYCERIN (NITROSTAT) SL tablet 0.4 mg  0.4 mg Sublingual Q5 min PRN Ivor Costa, MD      . ondansetron [facility]) tablet 4 mg  4 mg Oral Q6H PRN Ivor Costa, MD       Or  . ondansetron [facility]) injection 4 mg  4 mg Intravenous Q6H PRN Ivor Costa, MD      . pantoprazole (PROTONIX) EC tablet 20 mg  20 mg Oral Daily Ivor Costa, MD   20 mg at 12/21/16 1015  . piperacillin-tazobactam (ZOSYN) IVPB 3.375 g  3.375 g Intravenous Q8H David Tat, MD 12.5 mL/hr at 12/21/16 0809 3.375 g at 12/21/16 0809  . senna-docusate (Senokot-S) tablet 1 tablet  1 tablet Oral QHS PRN Ivor Costa, MD      . sodium chloride flush (NS) 0.9 % injection 3 mL  3 mL Intravenous Q12H Ivor Costa, MD   3 mL at 12/20/16 2250  . vitamin B-12 (CYANOCOBALAMIN) tablet 2,000 mcg  2,000 mcg Oral Daily Ivor Costa, MD   2,000 mcg at 12/21/16 1015     Discharge Medications: Please see discharge summary for a list of discharge medications.  Relevant Imaging Results:  Relevant Lab Results:   Additional Information SSN [id number]  Farah Benish, Carmela Hurt D, [place]

## 2016-12-21 NOTE — Progress Notes (Signed)
Offsite EEG completed at [facility]. Results pending. 

## 2016-12-21 NOTE — Evaluation (Signed)
Clinical/Bedside Swallow Evaluation Patient Details  Name: Barry Dean MRN: [id number] Date of Birth: December 23, 1923  Today's Date: 12/21/2016 Time: SLP Start Time (ACUTE ONLY): 1420 SLP Stop Time (ACUTE ONLY): 1446 SLP Time Calculation (min) (ACUTE ONLY): 26 min  Past Medical History:  Past Medical History:  Diagnosis Date  . Abnormality of gait   . Anxiety   . Arthritis   . Atrial fibrillation ([place])   . BPH (benign prostatic hyperplasia)   . Deafness   . Dehydration   . Depression   . GERD (gastroesophageal reflux disease)   . Hypertension   . Melanoma ([place])   . Mitral regurgitation   . Reflux   . Seizure ([place])   . Seizures ([place])   . Syncope   . Tremor 09/27/2015  . Tremor, essential    Past Surgical History:  Past Surgical History:  Procedure Laterality Date  . APPENDECTOMY    . CATARACT EXTRACTION W/ INTRAOCULAR LENS  IMPLANT, BILATERAL    . ESOPHAGOGASTRODUODENOSCOPY (EGD) WITH PROPOFOL N/A 09/15/2014   Procedure: ESOPHAGOGASTRODUODENOSCOPY (EGD) WITH PROPOFOL;  Surgeon: [name], MD;  Location: [facility] ENDOSCOPY;  Service: Endoscopy;  Laterality: N/A;  . unknown     HPI:  83 year old male, HOH,  with a history of dementia, hypertension, paroxysmal atrial fibrillation, essential tremor, gelastic seizure disorder, and depression presents from skilled nursing facility secondary to an unwitnessed fall in the bathroom. Dx acute respiratory failure with hypoxia; acute on chronic CHF, aspiration/lobar pna. Pt is well known to SLP services, having been seen for multiple swallow assessments in the last five years. Last MBS was 03/13/16, which revealed mild dysphagia with age-related changes but no penetration to vocal folds nor aspiration.    Assessment / Plan / Recommendation Clinical Impression  Pt presents with functional oropharyngeal swallow with adequate masticaiton, brisk swallow response, no s/s of aspiration despite large successive thin liquid boluses mixed with solids.   Recommend continuing current diet - mechanical soft, thin liquids - no SLP f/u is warranted.  Our services will sign off.  SLP Visit Diagnosis: Dysphagia, unspecified (R13.10)    Aspiration Risk  No limitations    Diet Recommendation     Medication Administration: Whole meds with puree    Other  Recommendations Oral Care Recommendations: Oral care BID   Follow up Recommendations None      Frequency and Duration            Prognosis        Swallow Study   General Date of Onset: 12/19/16 HPI: 83 year old male with a history of dementia, hypertension, paroxysmal atrial fibrillation, essential tremor, gelastic seizure disorder, and depression presents from skilled nursing facility secondary to an unwitnessed fall in the bathroom. Dx acute respiratory failure with hypoxia; acute on chronic CHF, aspiration/lobar pna. Pt is well known to SLP services, having been seen for multiple swallow assessments in the last five years. Last MBS was 03/13/16, which revealed mild dysphagia with age-related changes but no penetration to vocal folds nor aspiration.  Type of Study: Bedside Swallow Evaluation Previous Swallow Assessment: see HPI Diet Prior to this Study: Dysphagia 3 (soft);Thin liquids Temperature Spikes Noted: No Respiratory Status: Room air History of Recent Intubation: No Behavior/Cognition: Alert;Confused Oral Cavity Assessment: Within Functional Limits Oral Care Completed by SLP: No Oral Cavity - Dentition: Adequate natural dentition Vision: Functional for self-feeding Self-Feeding Abilities: Able to feed self Patient Positioning: Upright in bed Baseline Vocal Quality: Normal Volitional Cough: Strong Volitional Swallow: Able to elicit  Oral/Motor/Sensory Function Overall Oral Motor/Sensory Function: Within functional limits   Ice Chips Ice chips: Within functional limits   Thin Liquid Thin Liquid: Within functional limits    Nectar Thick Nectar Thick Liquid: Not tested    Honey Thick Honey Thick Liquid: Not tested   Puree Puree: Within functional limits   Solid   GO   Solid: Within functional limits        Barry Dean 12/21/2016,2:48 PM

## 2016-12-22 ENCOUNTER — Inpatient Hospital Stay (HOSPITAL_COMMUNITY): Payer: Medicare Other

## 2016-12-22 DIAGNOSIS — I509 Heart failure, unspecified: Secondary | ICD-10-CM

## 2016-12-22 DIAGNOSIS — L8990 Pressure ulcer of unspecified site, unspecified stage: Secondary | ICD-10-CM | POA: Insufficient documentation

## 2016-12-22 LAB — BASIC METABOLIC PANEL WITH GFR
Anion gap: 10 (ref 5–15)
BUN: 21 mg/dL — ABNORMAL HIGH (ref 6–20)
CO2: 26 mmol/L (ref 22–32)
Calcium: 8.6 mg/dL — ABNORMAL LOW (ref 8.9–10.3)
Chloride: 101 mmol/L (ref 101–111)
Creatinine, Ser: 1.02 mg/dL (ref 0.61–1.24)
GFR calc Af Amer: >60 mL/min
GFR calc non Af Amer: >60 mL/min
Glucose, Bld: 80 mg/dL (ref 65–99)
Potassium: 3.1 mmol/L — ABNORMAL LOW (ref 3.5–5.1)
Sodium: 137 mmol/L (ref 135–145)

## 2016-12-22 LAB — ECHOCARDIOGRAM COMPLETE
Height: 74 in
Weight: 2892.44 [oz_av]

## 2016-12-22 LAB — GLUCOSE, CAPILLARY: Glucose-Capillary: 69 mg/dL (ref 65–99)

## 2016-12-22 LAB — MAGNESIUM: Magnesium: 2.1 mg/dL (ref 1.7–2.4)

## 2016-12-22 LAB — PROCALCITONIN: Procalcitonin: <0.1 ng/mL

## 2016-12-22 MED ORDER — IPRATROPIUM-ALBUTEROL 0.5-2.5 (3) MG/3ML IN SOLN: 3.0000 mL | RESPIRATORY_TRACT | Status: DC | PRN

## 2016-12-22 MED ORDER — AMOXICILLIN-POT CLAVULANATE 875-125 MG PO TABS: 1.0000 | ORAL_TABLET | Freq: Two times a day (BID) | ORAL | 0 refills | Status: DC

## 2016-12-22 MED ORDER — AMOXICILLIN-POT CLAVULANATE 875-125 MG PO TABS
1.0000 | ORAL_TABLET | Freq: Two times a day (BID) | ORAL | Status: DC
  Administered 2016-12-22 – 2016-12-23 (×3): 1 via ORAL
  Filled 2016-12-22 (×2): qty 1

## 2016-12-22 MED ORDER — POTASSIUM CHLORIDE CRYS ER 20 MEQ PO TBCR
40.0000 meq | EXTENDED_RELEASE_TABLET | Freq: Once | ORAL | Status: AC
  Administered 2016-12-22: 40 meq via ORAL
  Filled 2016-12-22: qty 2

## 2016-12-22 MED ORDER — FERROUS SULFATE 325 (65 FE) MG PO TABS: 325.0000 mg | ORAL_TABLET | Freq: Two times a day (BID) | ORAL | 0 refills | Status: AC

## 2016-12-22 MED ORDER — AMLODIPINE BESYLATE 5 MG PO TABS: 5.0000 mg | ORAL_TABLET | Freq: Every day | ORAL | 0 refills | Status: AC

## 2016-12-22 MED ORDER — POLYETHYLENE GLYCOL 3350 17 G PO PACK: 17.0000 g | PACK | Freq: Every day | ORAL | 0 refills | Status: DC

## 2016-12-22 NOTE — Progress Notes (Signed)
*  PRELIMINARY RESULTS* Echocardiogram Echo has been performed.  Barry Dean 12/22/2016, 1:54 PM

## 2016-12-22 NOTE — Discharge Summary (Signed)
Physician Discharge Summary  Barry Dean HYW:[id number] DOB: 12-Jan-1924 DOA: 12/19/2016  PCP: [name], MD  Admit date: 12/19/2016 Discharge date: 12/22/2016  Admitted From: [facility] Disposition:  [name]  Recommendations for Outpatient Follow-up:  1. Follow up with PCP in 1-2 weeks 2. Please obtain BMP/CBC in one week 3. Then check CBC every 30 days x 3 months 4. Please weigh patient daily and give lasix 40 mg po if weight gain 3 or more pounds  Home Health: YES Equipment/Devices: PT/OT  Discharge Condition: Stable CODE STATUS: DNR Diet recommendation: Dysphagia 3 with thin liquid  Brief/Interim Summary: 83 year old male with a history of dementia, hypertension, paroxysmal atrial fibrillation, essential tremor, gelastic seizure disorder, and depression presents from skilled nursing facility secondary to an unwitnessed fall in the bathroom. The patient's seizure medications was just recently increased by his neurologist, Dr. [name] on 12/13/2016. His Lamictal was increased to 100 mg twice a day. Secondary to the patient's dementia, he is unable to provide any history. Apparently, he was found in the bathroom by the nursing staff at the skilled nursing facility. He was noted to be hypoxic. He was brought to the emergency department for further evaluation. Chest x-ray showed alveolar and interstitial opacities with lactic acid 1.4 and WBC 7.5. He had a hemoglobin of 4.2. The patient was transfused 3 units PRBC. The patient was hypoxic and saturation 85% on room air in ED.  He was started on IV lasix and IV zosyn with clinical improvement   Discharge Diagnoses:  Acute respiratory failure with hypoxia -Presently stable on 55% Venturi mask--oxygen saturation 96-98percent -weaned to Nasal cannula -Secondary to pulmonary edema and aspiration pneumonia  -Wean oxygen for saturation greater than 92%  -stable on RA at the time of d/c with saturation 96%  Acute on  chronic diastolic CHF  -In part precipitated by the patient's blood loss anemia with hemoglobin 4.2  -He appears clinically euvolemic a.m. 12/20/2016  -Daily weights  -I/O not accurate as his condom catheter has dislodged  -pt appears clinically euvolemic 4/27 -d/c IV lasix -12/20/16 personally reviewed chest x-ray--new RUL, RLL consolidation with persistent intersititial markings -03/14/2016 echo EF 60-65%, grade 2 DD, no WMA -lasix 40 mg po for weight gain of 3 or more pounds -weight 180 on day of d/c  Aspiration pneumonia/Lobar Pneumonia -Continue Zosyn during the hospitalization -discharge with amox/clav x 4 more days to complete one week of therpye -There is a question whether patient truly had syncope  -Check procalcitonin--<0.10 -speech therapy eval-->continue dysphagia 3 diet with thin liquids -he is on "chopped" diet at [facility] -12/20/16 personally reviewed chest x-ray--new RUL, RLL consolidation with persistent -suspect he may have underlying ILD  Symptomatic anemia/acute blood loss anemia  -FOBT negative  -transfused 3 units PRBC for the admission -Baseline hemoglobin approximately 9-10  -CT abdomen and pelvis negative for retroperitoneal hematoma  -LDH not suggestive of hemolysis  -Check iron studies--suggest anemia of chronic disease although done after blood transfusion -start oral iron--325 bid -Hgb remained stable after transfusion--Hgb 8.2 at time of d/c  Gelastic seizures -follows Dr. [name] -continue home doses lamictal and vimpat -EEG--no epileptiform discharges, nonspecific cerebral dysfunction -lamictal dose just increased on 12/13/16  PAF -CHADS-VASc = 4 -not AC candidate due to age, multiple comorbidities, and high fall risk  Hypertension - off amlodipine/benazepril during hospitalization -will not continue benazepril -restart amlodipine 5 mg daily after d/c  Constipation -4/25 CT abd--moderate stool burden -Discontinue Questran and  imodium -Continue Colace and Senokot  Hypokalemia -repleted -check mag--2.1 -am BMP  Elevated troponin -secondary to demand ischemia -no anginal symptoms -personally reviewed EKG-sinus with nonspecfic ST changes    Discharge Instructions  Discharge Instructions    Increase activity slowly    Complete by:  As directed      Allergies as of 12/22/2016      Reactions   Topamax Other (See Comments)   Reaction:  Unknown    Uroxatral [alfuzosin Hydrochloride] Other (See Comments)   Reaction:  Unknown    Valium Other (See Comments)   Reaction:  Unknown       Medication List    STOP taking these medications   amLODipine-benazepril 5-20 MG capsule Commonly known as:  LOTREL   cholestyramine 4 g packet Commonly known as:  QUESTRAN   loperamide 2 MG capsule Commonly known as:  IMODIUM     TAKE these medications   amLODipine 5 MG tablet Commonly known as:  NORVASC Take 1 tablet (5 mg total) by mouth daily. Start taking on:  12/23/2016   amoxicillin-clavulanate 875-125 MG tablet Commonly known as:  AUGMENTIN Take 1 tablet by mouth every 12 (twelve) hours.   aspirin 81 MG chewable tablet Chew 1 tablet (81 mg total) by mouth every evening.   feeding supplement (ENSURE ENLIVE) Liqd Take 237 mLs by mouth daily.   ferrous sulfate 325 (65 FE) MG tablet Take 1 tablet (325 mg total) by mouth 2 (two) times daily with a meal.   ipratropium-albuterol 0.5-2.5 (3) MG/3ML Soln Commonly known as:  DUONEB Take 3 mLs by nebulization every 6 (six) hours as needed. What changed:  reasons to take this   lacosamide 50 MG Tabs tablet Commonly known as:  VIMPAT Take 1 tablet in the AM (50 mg) and 3 tablets (150 mg ) in the PM. What changed:  how much to take  how to take this  when to take this  additional instructions   lamoTRIgine 100 MG tablet Commonly known as:  LAMICTAL Take 1 tablet (100 mg total) by mouth 2 (two) times daily.   mineral oil external  liquid Place 3 drops into both ears every Friday.   pantoprazole 20 MG tablet Commonly known as:  PROTONIX Take 20 mg by mouth daily.   polyethylene glycol packet Commonly known as:  MIRALAX Take 17 g by mouth daily.   sennosides-docusate sodium 8.6-50 MG tablet Commonly known as:  SENOKOT-S Take 1 tablet by mouth at bedtime as needed for constipation.   vitamin B-12 1000 MCG tablet Commonly known as:  CYANOCOBALAMIN Take 2,000 mcg by mouth daily.       Allergies  Allergen Reactions  . Topamax Other (See Comments)    Reaction:  Unknown   . Uroxatral [Alfuzosin Hydrochloride] Other (See Comments)    Reaction:  Unknown   . Valium Other (See Comments)    Reaction:  Unknown     Consultations:  none   Procedures/Studies: Ct Abdomen Pelvis Wo Contrast  Result Date: 12/19/2016 CLINICAL DATA:  Fall.  Anemia. EXAM: CT ABDOMEN AND PELVIS WITHOUT CONTRAST TECHNIQUE: Multidetector CT imaging of the abdomen and pelvis was performed following the standard protocol without IV contrast. COMPARISON:  03/12/2016 FINDINGS: Lower chest: Large hiatal hernia. Heart is normal size. Trace bilateral pleural effusions. Left lower lobe atelectasis. Hepatobiliary: No focal hepatic abnormality. Gallbladder unremarkable. Pancreas: No focal abnormality or ductal dilatation. Spleen: No focal abnormality.  Normal size. Adrenals/Urinary Tract: No adrenal abnormality. No focal renal abnormality. No stones or hydronephrosis. Urinary bladder is  unremarkable. Stomach/Bowel: Moderate stool burden throughout the colon. No evidence of obstruction. Vascular/Lymphatic: Aortic and iliac calcifications. No aneurysm or adenopathy. Reproductive: No visible focal abnormality. Other: No free fluid or free air.  No retroperitoneal hematoma. Musculoskeletal: No acute bony abnormality. IMPRESSION: Moderate stool burden throughout the colon. Trace bilateral pleural effusions.  Left lower lobe atelectasis. Large hiatal hernia. No  acute findings in the abdomen or pelvis. No retroperitoneal hematoma. Electronically Signed   By: Rolm Baptise M.D.   On: 12/19/2016 20:06   Dg Chest 2 View  Result Date: 12/19/2016 CLINICAL DATA:  Unwitnessed fall in bathroom. Shortness of breath and hypoxia. EXAM: CHEST  2 VIEW COMPARISON:  Chest radiograph March 21, 2016 FINDINGS: Cardiac silhouette is mildly enlarged. Tortuous calcified aorta. Interstitial and alveolar airspace opacities for 7 prior imaging. Small probable LEFT pleural effusion. No pneumothorax. Patient rotated LEFT. Old LEFT humerus fracture. Large hiatal hernia. IMPRESSION: Increasing interstitial and alveolar airspace opacities associated with pulmonary edema and/or pneumonia. Stable cardiomegaly. Electronically Signed   By: Elon Alas M.D.   On: 12/19/2016 18:34   Ct Head Wo Contrast  Result Date: 12/19/2016 CLINICAL DATA:  Initial evaluation for acute trauma, unwitnessed fall. EXAM: CT HEAD WITHOUT CONTRAST CT CERVICAL SPINE WITHOUT CONTRAST TECHNIQUE: Multidetector CT imaging of the head and cervical spine was performed following the standard protocol without intravenous contrast. Multiplanar CT image reconstructions of the cervical spine were also generated. COMPARISON:  Prior CT from 07/08/2016. FINDINGS: CT HEAD FINDINGS Brain: Mild for age cerebral atrophy with moderate chronic microvascular ischemic disease, stable. No acute intracranial hemorrhage. No evidence for acute large vessel territory infarct. No mass lesion, midline shift or mass effect. No hydrocephalus. No extra-axial fluid collection. Vascular: No hyperdense vessel. Scattered vascular calcifications noted within the carotid siphons. Skull: Scalp soft tissues demonstrate no acute abnormality. Calvarium intact. Sinuses/Orbits: Globes and orbital soft tissues within normal limits. Paranasal sinuses and mastoids are clear. CT CERVICAL SPINE FINDINGS Alignment: Straightening of the normal cervical lordosis. Trace  anterolisthesis of C3 on C4, with trace retrolisthesis of C4 on C5. Skull base and vertebrae: Skullbase intact. C1-2 articulations are unchanged. Rotation of C1 on C2 likely positional. Dens is intact. Vertebral body heights maintained. The no acute fracture. Soft tissues and spinal canal: Visualized soft tissues of the neck demonstrate no acute abnormality. No prevertebral edema. Prominent vascular calcifications about the carotid bifurcations. Disc levels: Moderate to advanced multilevel degenerative spondylolysis, greatest at C4-5 and C5-6. Predominant right-sided facet arthrosis. Upper chest: Visualized upper chest demonstrates no acute abnormality. Partially visualized lung apices are grossly clear. Other: IMPRESSION: CT BRAIN: 1. No acute intracranial process. 2. Stable atrophy with chronic microvascular ischemic disease. CT CERVICAL SPINE: 1. No acute traumatic injury within cervical spine. 2. Stable alignment with advanced multilevel degenerative disc disease. Electronically Signed   By: Jeannine Boga M.D.   On: 12/19/2016 18:47   Ct Cervical Spine Wo Contrast  Result Date: 12/19/2016 CLINICAL DATA:  Initial evaluation for acute trauma, unwitnessed fall. EXAM: CT HEAD WITHOUT CONTRAST CT CERVICAL SPINE WITHOUT CONTRAST TECHNIQUE: Multidetector CT imaging of the head and cervical spine was performed following the standard protocol without intravenous contrast. Multiplanar CT image reconstructions of the cervical spine were also generated. COMPARISON:  Prior CT from 07/08/2016. FINDINGS: CT HEAD FINDINGS Brain: Mild for age cerebral atrophy with moderate chronic microvascular ischemic disease, stable. No acute intracranial hemorrhage. No evidence for acute large vessel territory infarct. No mass lesion, midline shift or mass effect. No hydrocephalus. No  extra-axial fluid collection. Vascular: No hyperdense vessel. Scattered vascular calcifications noted within the carotid siphons. Skull: Scalp soft  tissues demonstrate no acute abnormality. Calvarium intact. Sinuses/Orbits: Globes and orbital soft tissues within normal limits. Paranasal sinuses and mastoids are clear. CT CERVICAL SPINE FINDINGS Alignment: Straightening of the normal cervical lordosis. Trace anterolisthesis of C3 on C4, with trace retrolisthesis of C4 on C5. Skull base and vertebrae: Skullbase intact. C1-2 articulations are unchanged. Rotation of C1 on C2 likely positional. Dens is intact. Vertebral body heights maintained. The no acute fracture. Soft tissues and spinal canal: Visualized soft tissues of the neck demonstrate no acute abnormality. No prevertebral edema. Prominent vascular calcifications about the carotid bifurcations. Disc levels: Moderate to advanced multilevel degenerative spondylolysis, greatest at C4-5 and C5-6. Predominant right-sided facet arthrosis. Upper chest: Visualized upper chest demonstrates no acute abnormality. Partially visualized lung apices are grossly clear. Other: IMPRESSION: CT BRAIN: 1. No acute intracranial process. 2. Stable atrophy with chronic microvascular ischemic disease. CT CERVICAL SPINE: 1. No acute traumatic injury within cervical spine. 2. Stable alignment with advanced multilevel degenerative disc disease. Electronically Signed   By: Jeannine Boga M.D.   On: 12/19/2016 18:47   Dg Chest Port 1 View  Result Date: 12/20/2016 CLINICAL DATA:  Pulmonary edema EXAM: PORTABLE CHEST 1 VIEW COMPARISON:  December 19, 2016 pain March 21, 2016 FINDINGS: There is new airspace consolidation in the right upper lobe, right perihilar region common right base, concerning for pneumonia. There is underlying diffuse interstitial prominence with nodular interstitial disease throughout the lungs diffusely. This appearance is stable. Heart is mildly enlarged with pulmonary vascular within normal limits. No adenopathy. There is aortic atherosclerosis. No bone lesions. IMPRESSION: Suspect multifocal pneumonia on the  right. Areas of consolidation are new compared to 1 day prior on the right. There is underlying nodular interstitial disease and diffuse interstitial prominence. While there may be a degree of pulmonary edema, the appearance suggests noncardiogenic etiology for these changes. A degree of underlying diffuse pulmonary fibrosis is felt to be more likely. Heart remains slightly prominent with pulmonary vascularity within normal limits. There is aortic atherosclerosis. Electronically Signed   By: Lowella Grip III M.D.   On: 12/20/2016 08:59        Discharge Exam: Vitals:   12/21/16 2050 12/22/16 0626  BP: (!) 141/45 (!) 149/53  Pulse: 75 72  Resp: 18 18  Temp: 99.2 F (37.3 C) 98 F (36.7 C)   Vitals:   12/21/16 2019 12/21/16 2050 12/22/16 0626 12/22/16 0813  BP:  (!) 141/45 (!) 149/53   Pulse:  75 72   Resp:  18 18   Temp:  99.2 F (37.3 C) 98 F (36.7 C)   TempSrc:  Oral Oral   SpO2: 95% 96% 93% 98%  Weight:      Height:        General: Pt is alert, awake, not in acute distress Cardiovascular: RRR, S1/S2 +, no rubs, no gallops Respiratory: bibasilar rales, no wheeze Abdominal: Soft, NT, ND, bowel sounds + Extremities: no edema, no cyanosis   The results of significant diagnostics from this hospitalization (including imaging, microbiology, ancillary and laboratory) are listed below for reference.    Significant Diagnostic Studies: Ct Abdomen Pelvis Wo Contrast  Result Date: 12/19/2016 CLINICAL DATA:  Fall.  Anemia. EXAM: CT ABDOMEN AND PELVIS WITHOUT CONTRAST TECHNIQUE: Multidetector CT imaging of the abdomen and pelvis was performed following the standard protocol without IV contrast. COMPARISON:  03/12/2016 FINDINGS: Lower chest: Large hiatal  hernia. Heart is normal size. Trace bilateral pleural effusions. Left lower lobe atelectasis. Hepatobiliary: No focal hepatic abnormality. Gallbladder unremarkable. Pancreas: No focal abnormality or ductal dilatation. Spleen: No  focal abnormality.  Normal size. Adrenals/Urinary Tract: No adrenal abnormality. No focal renal abnormality. No stones or hydronephrosis. Urinary bladder is unremarkable. Stomach/Bowel: Moderate stool burden throughout the colon. No evidence of obstruction. Vascular/Lymphatic: Aortic and iliac calcifications. No aneurysm or adenopathy. Reproductive: No visible focal abnormality. Other: No free fluid or free air.  No retroperitoneal hematoma. Musculoskeletal: No acute bony abnormality. IMPRESSION: Moderate stool burden throughout the colon. Trace bilateral pleural effusions.  Left lower lobe atelectasis. Large hiatal hernia. No acute findings in the abdomen or pelvis. No retroperitoneal hematoma. Electronically Signed   By: Rolm Baptise M.D.   On: 12/19/2016 20:06   Dg Chest 2 View  Result Date: 12/19/2016 CLINICAL DATA:  Unwitnessed fall in bathroom. Shortness of breath and hypoxia. EXAM: CHEST  2 VIEW COMPARISON:  Chest radiograph March 21, 2016 FINDINGS: Cardiac silhouette is mildly enlarged. Tortuous calcified aorta. Interstitial and alveolar airspace opacities for 7 prior imaging. Small probable LEFT pleural effusion. No pneumothorax. Patient rotated LEFT. Old LEFT humerus fracture. Large hiatal hernia. IMPRESSION: Increasing interstitial and alveolar airspace opacities associated with pulmonary edema and/or pneumonia. Stable cardiomegaly. Electronically Signed   By: Elon Alas M.D.   On: 12/19/2016 18:34   Ct Head Wo Contrast  Result Date: 12/19/2016 CLINICAL DATA:  Initial evaluation for acute trauma, unwitnessed fall. EXAM: CT HEAD WITHOUT CONTRAST CT CERVICAL SPINE WITHOUT CONTRAST TECHNIQUE: Multidetector CT imaging of the head and cervical spine was performed following the standard protocol without intravenous contrast. Multiplanar CT image reconstructions of the cervical spine were also generated. COMPARISON:  Prior CT from 07/08/2016. FINDINGS: CT HEAD FINDINGS Brain: Mild for age cerebral  atrophy with moderate chronic microvascular ischemic disease, stable. No acute intracranial hemorrhage. No evidence for acute large vessel territory infarct. No mass lesion, midline shift or mass effect. No hydrocephalus. No extra-axial fluid collection. Vascular: No hyperdense vessel. Scattered vascular calcifications noted within the carotid siphons. Skull: Scalp soft tissues demonstrate no acute abnormality. Calvarium intact. Sinuses/Orbits: Globes and orbital soft tissues within normal limits. Paranasal sinuses and mastoids are clear. CT CERVICAL SPINE FINDINGS Alignment: Straightening of the normal cervical lordosis. Trace anterolisthesis of C3 on C4, with trace retrolisthesis of C4 on C5. Skull base and vertebrae: Skullbase intact. C1-2 articulations are unchanged. Rotation of C1 on C2 likely positional. Dens is intact. Vertebral body heights maintained. The no acute fracture. Soft tissues and spinal canal: Visualized soft tissues of the neck demonstrate no acute abnormality. No prevertebral edema. Prominent vascular calcifications about the carotid bifurcations. Disc levels: Moderate to advanced multilevel degenerative spondylolysis, greatest at C4-5 and C5-6. Predominant right-sided facet arthrosis. Upper chest: Visualized upper chest demonstrates no acute abnormality. Partially visualized lung apices are grossly clear. Other: IMPRESSION: CT BRAIN: 1. No acute intracranial process. 2. Stable atrophy with chronic microvascular ischemic disease. CT CERVICAL SPINE: 1. No acute traumatic injury within cervical spine. 2. Stable alignment with advanced multilevel degenerative disc disease. Electronically Signed   By: Jeannine Boga M.D.   On: 12/19/2016 18:47   Ct Cervical Spine Wo Contrast  Result Date: 12/19/2016 CLINICAL DATA:  Initial evaluation for acute trauma, unwitnessed fall. EXAM: CT HEAD WITHOUT CONTRAST CT CERVICAL SPINE WITHOUT CONTRAST TECHNIQUE: Multidetector CT imaging of the head and  cervical spine was performed following the standard protocol without intravenous contrast. Multiplanar CT image reconstructions of the  cervical spine were also generated. COMPARISON:  Prior CT from 07/08/2016. FINDINGS: CT HEAD FINDINGS Brain: Mild for age cerebral atrophy with moderate chronic microvascular ischemic disease, stable. No acute intracranial hemorrhage. No evidence for acute large vessel territory infarct. No mass lesion, midline shift or mass effect. No hydrocephalus. No extra-axial fluid collection. Vascular: No hyperdense vessel. Scattered vascular calcifications noted within the carotid siphons. Skull: Scalp soft tissues demonstrate no acute abnormality. Calvarium intact. Sinuses/Orbits: Globes and orbital soft tissues within normal limits. Paranasal sinuses and mastoids are clear. CT CERVICAL SPINE FINDINGS Alignment: Straightening of the normal cervical lordosis. Trace anterolisthesis of C3 on C4, with trace retrolisthesis of C4 on C5. Skull base and vertebrae: Skullbase intact. C1-2 articulations are unchanged. Rotation of C1 on C2 likely positional. Dens is intact. Vertebral body heights maintained. The no acute fracture. Soft tissues and spinal canal: Visualized soft tissues of the neck demonstrate no acute abnormality. No prevertebral edema. Prominent vascular calcifications about the carotid bifurcations. Disc levels: Moderate to advanced multilevel degenerative spondylolysis, greatest at C4-5 and C5-6. Predominant right-sided facet arthrosis. Upper chest: Visualized upper chest demonstrates no acute abnormality. Partially visualized lung apices are grossly clear. Other: IMPRESSION: CT BRAIN: 1. No acute intracranial process. 2. Stable atrophy with chronic microvascular ischemic disease. CT CERVICAL SPINE: 1. No acute traumatic injury within cervical spine. 2. Stable alignment with advanced multilevel degenerative disc disease. Electronically Signed   By: Jeannine Boga M.D.   On:  12/19/2016 18:47   Dg Chest Port 1 View  Result Date: 12/20/2016 CLINICAL DATA:  Pulmonary edema EXAM: PORTABLE CHEST 1 VIEW COMPARISON:  December 19, 2016 pain March 21, 2016 FINDINGS: There is new airspace consolidation in the right upper lobe, right perihilar region common right base, concerning for pneumonia. There is underlying diffuse interstitial prominence with nodular interstitial disease throughout the lungs diffusely. This appearance is stable. Heart is mildly enlarged with pulmonary vascular within normal limits. No adenopathy. There is aortic atherosclerosis. No bone lesions. IMPRESSION: Suspect multifocal pneumonia on the right. Areas of consolidation are new compared to 1 day prior on the right. There is underlying nodular interstitial disease and diffuse interstitial prominence. While there may be a degree of pulmonary edema, the appearance suggests noncardiogenic etiology for these changes. A degree of underlying diffuse pulmonary fibrosis is felt to be more likely. Heart remains slightly prominent with pulmonary vascularity within normal limits. There is aortic atherosclerosis. Electronically Signed   By: Lowella Grip III M.D.   On: 12/20/2016 08:59     Microbiology: Recent Results (from the past 240 hour(s))  Blood Culture (routine x 2)     Status: None (Preliminary result)   Collection Time: 12/19/16  5:58 PM  Result Value Ref Range Status   Specimen Description BLOOD LEFT ANTECUBITAL  Final   Special Requests   Final    BOTTLES DRAWN AEROBIC AND ANAEROBIC Blood Culture adequate volume   Culture   Final    NO GROWTH 2 DAYS Performed at [facility], [address], [place], [place] [address]    Report Status PENDING  Incomplete  Blood Culture (routine x 2)     Status: None (Preliminary result)   Collection Time: 12/19/16  6:26 PM  Result Value Ref Range Status   Specimen Description BLOOD BLOOD RIGHT FOREARM  Final   Special Requests   Final    BOTTLES DRAWN  AEROBIC AND ANAEROBIC Blood Culture adequate volume   Culture   Final    NO GROWTH  2 DAYS Performed at [facility], [place] [address] [place], [place] [address]    Report Status PENDING  Incomplete  MRSA PCR Screening     Status: None   Collection Time: 12/19/16  8:46 PM  Result Value Ref Range Status   MRSA by PCR NEGATIVE NEGATIVE Final    Comment:        The GeneXpert MRSA Assay (FDA approved for NASAL specimens only), is one component of a comprehensive MRSA colonization surveillance program. It is not intended to diagnose MRSA infection nor to guide or monitor treatment for MRSA infections.      Labs: Basic Metabolic Panel:  Recent Labs Lab 12/19/16 1733 12/20/16 0815 12/21/16 0543 12/22/16 0527  NA 137 136 137 137  K 4.8 3.8 3.3* 3.1*  CL 106 102 103 101  CO2 22 25 26 26   GLUCOSE 108* 106* 95 80  BUN 26* 23* 20 21*  CREATININE 1.06 1.04 1.00 1.02  CALCIUM 9.1 8.5* 8.6* 8.6*  MG  --   --   --  2.1   Liver Function Tests:  Recent Labs Lab 12/19/16 1758  AST 31  ALT 18  ALKPHOS 72  BILITOT 0.4  PROT 6.3*  ALBUMIN 3.7    Recent Labs Lab 12/19/16 1758  LIPASE 20   No results for input(s): AMMONIA in the last 168 hours. CBC:  Recent Labs Lab 12/19/16 1733 12/19/16 2045 12/20/16 0800 12/21/16 1249  WBC 6.2 7.5 8.3 7.1  HGB 4.2* 4.2* 7.7* 8.2*  HCT 15.1* 15.1* 23.5* 26.5*  MCV 66.2* 66.2* 73.4* 74.0*  PLT 362 308 300 314   Cardiac Enzymes:  Recent Labs Lab 12/19/16 2045 12/20/16 0815  TROPONINI 0.17* 0.22*   BNP: Invalid input(s): POCBNP CBG:  Recent Labs Lab 12/19/16 1728 12/20/16 0739 12/21/16 0756 12/22/16 0749  GLUCAP 105* 118* 95 69    Time coordinating discharge:  Greater than 30 minutes  Signed:  Mayce Noyes, DO [facility] Pager: [phone] 12/22/2016, 11:44 AM

## 2016-12-22 NOTE — Evaluation (Signed)
Physical Therapy Evaluation Patient Details Name: Barry Dean MRN: [id number] DOB: [birth year] Today's Date: 12/22/2016   History of Present Illness  83 yo male admitted with symptomatic anemia, fall. Hx of Sz, A fib, deafness, arthritis, Parkinson's.   Clinical Impression  On eval, pt required Min assist for mobility. He was able to perform a stand pivot, bed to recliner, with a RW. Per chart, pt is nonambulatory at baseline for safety reasons. No family present during session. Recommend return to ALF with HHPT f/u as long as staff at facility can provide current level of care. Will follow.     Follow Up Recommendations Home health PT;Supervision/Assistance - 24 hour (at ALF as long as facility can provide current level of care)    Equipment Recommendations  None recommended by PT    Recommendations for Other Services       Precautions / Restrictions Precautions Precautions: Fall Restrictions Weight Bearing Restrictions: No      Mobility  Bed Mobility Overal bed mobility: Needs Assistance Bed Mobility: Supine to Sit     Supine to sit: Supervision;HOB elevated     General bed mobility comments: for lines, safety. Increased time.   Transfers Overall transfer level: Needs assistance Equipment used: Rolling walker (2 wheeled) Transfers: Sit to/from [facility] Sit to Stand: Min assist Stand pivot transfers: Min assist       General transfer comment: Assist to rise, stabilize, control descent. Stand pivot, bed to recliner, with RW.   Ambulation/Gait             General Gait Details: NT  Stairs            Wheelchair Mobility    Modified Rankin (Stroke Patients Only)       Balance Overall balance assessment: Needs assistance;History of Falls           Standing balance-Leahy Scale: Poor                               Pertinent Vitals/Pain Pain Assessment: No/denies pain    Home Living Family/patient expects  to be discharged to:: Unsure     Type of Home: Assisted living [facility]) Home Access: Level entry     Home Layout: One level Home Equipment: Environmental consultant - 4 wheels      Prior Function Level of Independence: Needs assistance   Gait / Transfers Assistance Needed: per chart (previous entries), pt is nonambulatory for safety reasons (due to falls). Pivot to/from [facility] with assist.           Hand Dominance        Extremity/Trunk Assessment   Upper Extremity Assessment Upper Extremity Assessment: Generalized weakness    Lower Extremity Assessment Lower Extremity Assessment: Generalized weakness    Cervical / Trunk Assessment Cervical / Trunk Assessment: Kyphotic  Communication   Communication: HOH  Cognition Arousal/Alertness: Awake/alert Behavior During Therapy: WFL for tasks assessed/performed Overall Cognitive Status: Difficult to assess                                        General Comments      Exercises     Assessment/Plan    PT Assessment Patient needs continued PT services  PT Problem List Decreased strength;Decreased mobility;Decreased activity tolerance;Decreased balance;Decreased knowledge of use of DME       PT  Treatment Interventions DME instruction;Gait training;Therapeutic activities;Therapeutic exercise;Patient/family education;Functional mobility training;Balance training    PT Goals (Current goals can be found in the Care Plan section)  Acute Rehab PT Goals Patient Stated Goal: none stated PT Goal Formulation: With patient Time For Goal Achievement: 01/05/17 Potential to Achieve Goals: Good    Frequency Min 3X/week   Barriers to discharge        Co-evaluation               End of Session   Activity Tolerance: Patient tolerated treatment well Patient left: in chair;with call bell/phone within reach;with chair alarm set   PT Visit Diagnosis: Muscle weakness (generalized) (M62.81);Difficulty in walking,  not elsewhere classified (R26.2)    Time: 6967-8938 PT Time Calculation (min) (ACUTE ONLY): 23 min   Charges:   PT Evaluation $PT Eval Low Complexity: 1 Procedure     PT G Codes:          Weston Anna, MPT Pager: [phone]

## 2016-12-22 NOTE — Clinical Social Work Note (Signed)
SW notified by nursing this afternoon that a d/c order was in place for patient to return to [facility].  SW contacted facility and spoke with 2 different people who indicated that they would not accept patient back "this late" as the nurses who were on duty have gone home for the day and there is no one to review to d/c material. SW was unable to convince facility to accept patient and notified Dr. Carles Collet and patient's daughter who verbalized understanding.  Discussed with nursing and will ask CSW for tomorrow to contact ALF early in day to complete d/c plan.  Lorie Phenix. Pauline Good, [place]

## 2016-12-23 LAB — GLUCOSE, CAPILLARY: Glucose-Capillary: 101 mg/dL — ABNORMAL HIGH (ref 65–99)

## 2016-12-23 NOTE — Progress Notes (Signed)
Discharge on 12/22/16 was cancelled/postponed due to insurance problems Discharge now planned on 12/23/16  Shanon Brow Burak Zerbe

## 2016-12-23 NOTE — Discharge Summary (Signed)
Physician Discharge Summary  Barry Dean EQA:[id number] DOB: 1923/11/05 DOA: 12/19/2016  PCP: [name], MD  Admit date: 12/19/2016 Discharge date: 12/23/2016  Admitted From:[facility] Disposition:  [facility]  Recommendations for Outpatient Follow-up:  1. Follow up with PCP in 1-2 weeks 2. Please obtain BMP/CBC in one week 3. Then check CBC every 30 days x 3 months 4. Please weigh patient daily and give lasix 40 mg po if weight gain 3 or more pounds  Home Health: YES Equipment/Devices:PT/OT  Discharge Condition: Stable CODE STATUS: FULL Diet recommendation: Dysphagia 3 with thin liquid   Brief/Interim Summary: 83 year old male with a history of dementia, hypertension, paroxysmal atrial fibrillation, essential tremor, gelastic seizure disorder, and depression presents from skilled nursing facility secondary to an unwitnessed fall in the bathroom. The patient's seizure medications was just recently increased by his neurologist, Dr. [name] on 12/13/2016. His Lamictal was increased to 100 mg twice a day. Secondary to the patient's dementia, he is unable to provide any history. Apparently, he was found in the bathroom by the nursing staff at the skilled nursing facility. He was noted to be hypoxic. He was brought to the emergency department for further evaluation. Chest x-ray showed alveolar and interstitial opacities with lactic acid 1.4 and WBC 7.5. He had a hemoglobin of 4.2. The patient was transfused 3 units PRBC. The patient was hypoxic and saturation 85% on room air in ED. He was started on IV lasix and IV zosyn with clinical improvement  Discharge Diagnoses:  Acute respiratory failure with hypoxia -Presently stable on 55% Venturi mask--oxygen saturation 96-98percent -weaned to Nasal cannula -Secondary to pulmonary edema and aspiration pneumonia  -Wean oxygen for saturation greater than 92%  -stable on RA at the time of d/c with saturation 96%  Acute on  chronic diastolic CHF  -In part precipitated by the patient's blood loss anemia with hemoglobin 4.2  -He appears clinically euvolemic a.m. 12/20/2016  -Daily weights  -I/O not accurate as his condom catheter has dislodged  -pt appears clinically euvolemic 4/27 -d/c IV lasix -12/20/16 personally reviewedchest x-ray--new RUL, RLL consolidation with persistent intersititial markings -03/14/2016 echo EF 60-65%, grade 2 DD, no WMA -lasix 40 mg po for weight gain of 3 or more pounds -weight 180 on day of d/c  Aspiration pneumonia/Lobar Pneumonia -ContinueZosyn during the hospitalization -discharge with amox/clav x 4 more days to complete one week of therpye -There is a question whether patient truly had syncope  -Check procalcitonin--<0.10 -speech therapy eval-->continue dysphagia 3 diet with thin liquids -he is on "chopped" diet at [facility] -12/20/16 personally reviewedchest x-ray--new RUL, RLL consolidation with persistent -suspect he may have underlying ILD  Symptomatic anemia/acute blood loss anemia  -FOBT negative  -transfused 3 units PRBC for the admission -Baseline hemoglobin approximately 9-10  -CT abdomen and pelvis negative for retroperitoneal hematoma  -LDH not suggestive of hemolysis  -Check iron studies--suggest anemia of chronic disease although done after blood transfusion -start oral iron--325 bid -Hgb remained stable after transfusion--Hgb 8.2 at time of d/c  Gelastic seizures -follows Dr. [name] -continue home doses lamictal and vimpat -EEG--no epileptiform discharges, nonspecific cerebral dysfunction -lamictal dose just increased on 12/13/16  PAF -CHADS-VASc = 4 -not AC candidate due to age, multiple comorbidities, and high fall risk  Hypertension - off amlodipine/benazepril during hospitalization -will not continue benazepril -restart amlodipine 5 mg daily after d/c  Constipation -4/25 CT abd--moderate stool burden -Discontinue Questran and  imodium -ContinueColace and Senokot  Hypokalemia -repleted -check mag--2.1 -am BMP  Elevated troponin -secondary to demand ischemia -no anginal symptoms -personally reviewed EKG-sinus with nonspecfic ST changes  Stage I sacral decubitus -present at time of admission -air mattress -turn frequently    Discharge Instructions  Discharge Instructions    Increase activity slowly    Complete by:  As directed      Allergies as of 12/23/2016      Reactions   Topamax Other (See Comments)   Reaction:  Unknown    Uroxatral [alfuzosin Hydrochloride] Other (See Comments)   Reaction:  Unknown    Valium Other (See Comments)   Reaction:  Unknown       Medication List    STOP taking these medications   amLODipine-benazepril 5-20 MG capsule Commonly known as:  LOTREL   cholestyramine 4 g packet Commonly known as:  QUESTRAN   loperamide 2 MG capsule Commonly known as:  IMODIUM     TAKE these medications   amLODipine 5 MG tablet Commonly known as:  NORVASC Take 1 tablet (5 mg total) by mouth daily.   amoxicillin-clavulanate 875-125 MG tablet Commonly known as:  AUGMENTIN Take 1 tablet by mouth every 12 (twelve) hours.   aspirin 81 MG chewable tablet Chew 1 tablet (81 mg total) by mouth every evening.   feeding supplement (ENSURE ENLIVE) Liqd Take 237 mLs by mouth daily.   ferrous sulfate 325 (65 FE) MG tablet Take 1 tablet (325 mg total) by mouth 2 (two) times daily with a meal.   ipratropium-albuterol 0.5-2.5 (3) MG/3ML Soln Commonly known as:  DUONEB Take 3 mLs by nebulization every 6 (six) hours as needed. What changed:  reasons to take this   lacosamide 50 MG Tabs tablet Commonly known as:  VIMPAT Take 1 tablet in the AM (50 mg) and 3 tablets (150 mg ) in the PM. What changed:  how much to take  how to take this  when to take this  additional instructions   lamoTRIgine 100 MG tablet Commonly known as:  LAMICTAL Take 1 tablet (100 mg total) by  mouth 2 (two) times daily.   mineral oil external liquid Place 3 drops into both ears every Friday.   pantoprazole 20 MG tablet Commonly known as:  PROTONIX Take 20 mg by mouth daily.   polyethylene glycol packet Commonly known as:  MIRALAX Take 17 g by mouth daily.   sennosides-docusate sodium 8.6-50 MG tablet Commonly known as:  SENOKOT-S Take 1 tablet by mouth at bedtime as needed for constipation.   vitamin B-12 1000 MCG tablet Commonly known as:  CYANOCOBALAMIN Take 2,000 mcg by mouth daily.       Allergies  Allergen Reactions  . Topamax Other (See Comments)    Reaction:  Unknown   . Uroxatral [Alfuzosin Hydrochloride] Other (See Comments)    Reaction:  Unknown   . Valium Other (See Comments)    Reaction:  Unknown     Consultations:  none   Procedures/Studies: Ct Abdomen Pelvis Wo Contrast  Result Date: 12/19/2016 CLINICAL DATA:  Fall.  Anemia. EXAM: CT ABDOMEN AND PELVIS WITHOUT CONTRAST TECHNIQUE: Multidetector CT imaging of the abdomen and pelvis was performed following the standard protocol without IV contrast. COMPARISON:  03/12/2016 FINDINGS: Lower chest: Large hiatal hernia. Heart is normal size. Trace bilateral pleural effusions. Left lower lobe atelectasis. Hepatobiliary: No focal hepatic abnormality. Gallbladder unremarkable. Pancreas: No focal abnormality or ductal dilatation. Spleen: No focal abnormality.  Normal size. Adrenals/Urinary Tract: No adrenal abnormality. No focal renal abnormality. No stones or hydronephrosis. Urinary  bladder is unremarkable. Stomach/Bowel: Moderate stool burden throughout the colon. No evidence of obstruction. Vascular/Lymphatic: Aortic and iliac calcifications. No aneurysm or adenopathy. Reproductive: No visible focal abnormality. Other: No free fluid or free air.  No retroperitoneal hematoma. Musculoskeletal: No acute bony abnormality. IMPRESSION: Moderate stool burden throughout the colon. Trace bilateral pleural effusions.   Left lower lobe atelectasis. Large hiatal hernia. No acute findings in the abdomen or pelvis. No retroperitoneal hematoma. Electronically Signed   By: Rolm Baptise M.D.   On: 12/19/2016 20:06   Dg Chest 2 View  Result Date: 12/19/2016 CLINICAL DATA:  Unwitnessed fall in bathroom. Shortness of breath and hypoxia. EXAM: CHEST  2 VIEW COMPARISON:  Chest radiograph March 21, 2016 FINDINGS: Cardiac silhouette is mildly enlarged. Tortuous calcified aorta. Interstitial and alveolar airspace opacities for 7 prior imaging. Small probable LEFT pleural effusion. No pneumothorax. Patient rotated LEFT. Old LEFT humerus fracture. Large hiatal hernia. IMPRESSION: Increasing interstitial and alveolar airspace opacities associated with pulmonary edema and/or pneumonia. Stable cardiomegaly. Electronically Signed   By: Elon Alas M.D.   On: 12/19/2016 18:34   Ct Head Wo Contrast  Result Date: 12/19/2016 CLINICAL DATA:  Initial evaluation for acute trauma, unwitnessed fall. EXAM: CT HEAD WITHOUT CONTRAST CT CERVICAL SPINE WITHOUT CONTRAST TECHNIQUE: Multidetector CT imaging of the head and cervical spine was performed following the standard protocol without intravenous contrast. Multiplanar CT image reconstructions of the cervical spine were also generated. COMPARISON:  Prior CT from 07/08/2016. FINDINGS: CT HEAD FINDINGS Brain: Mild for age cerebral atrophy with moderate chronic microvascular ischemic disease, stable. No acute intracranial hemorrhage. No evidence for acute large vessel territory infarct. No mass lesion, midline shift or mass effect. No hydrocephalus. No extra-axial fluid collection. Vascular: No hyperdense vessel. Scattered vascular calcifications noted within the carotid siphons. Skull: Scalp soft tissues demonstrate no acute abnormality. Calvarium intact. Sinuses/Orbits: Globes and orbital soft tissues within normal limits. Paranasal sinuses and mastoids are clear. CT CERVICAL SPINE FINDINGS Alignment:  Straightening of the normal cervical lordosis. Trace anterolisthesis of C3 on C4, with trace retrolisthesis of C4 on C5. Skull base and vertebrae: Skullbase intact. C1-2 articulations are unchanged. Rotation of C1 on C2 likely positional. Dens is intact. Vertebral body heights maintained. The no acute fracture. Soft tissues and spinal canal: Visualized soft tissues of the neck demonstrate no acute abnormality. No prevertebral edema. Prominent vascular calcifications about the carotid bifurcations. Disc levels: Moderate to advanced multilevel degenerative spondylolysis, greatest at C4-5 and C5-6. Predominant right-sided facet arthrosis. Upper chest: Visualized upper chest demonstrates no acute abnormality. Partially visualized lung apices are grossly clear. Other: IMPRESSION: CT BRAIN: 1. No acute intracranial process. 2. Stable atrophy with chronic microvascular ischemic disease. CT CERVICAL SPINE: 1. No acute traumatic injury within cervical spine. 2. Stable alignment with advanced multilevel degenerative disc disease. Electronically Signed   By: Jeannine Boga M.D.   On: 12/19/2016 18:47   Ct Cervical Spine Wo Contrast  Result Date: 12/19/2016 CLINICAL DATA:  Initial evaluation for acute trauma, unwitnessed fall. EXAM: CT HEAD WITHOUT CONTRAST CT CERVICAL SPINE WITHOUT CONTRAST TECHNIQUE: Multidetector CT imaging of the head and cervical spine was performed following the standard protocol without intravenous contrast. Multiplanar CT image reconstructions of the cervical spine were also generated. COMPARISON:  Prior CT from 07/08/2016. FINDINGS: CT HEAD FINDINGS Brain: Mild for age cerebral atrophy with moderate chronic microvascular ischemic disease, stable. No acute intracranial hemorrhage. No evidence for acute large vessel territory infarct. No mass lesion, midline shift or mass effect. No  hydrocephalus. No extra-axial fluid collection. Vascular: No hyperdense vessel. Scattered vascular calcifications  noted within the carotid siphons. Skull: Scalp soft tissues demonstrate no acute abnormality. Calvarium intact. Sinuses/Orbits: Globes and orbital soft tissues within normal limits. Paranasal sinuses and mastoids are clear. CT CERVICAL SPINE FINDINGS Alignment: Straightening of the normal cervical lordosis. Trace anterolisthesis of C3 on C4, with trace retrolisthesis of C4 on C5. Skull base and vertebrae: Skullbase intact. C1-2 articulations are unchanged. Rotation of C1 on C2 likely positional. Dens is intact. Vertebral body heights maintained. The no acute fracture. Soft tissues and spinal canal: Visualized soft tissues of the neck demonstrate no acute abnormality. No prevertebral edema. Prominent vascular calcifications about the carotid bifurcations. Disc levels: Moderate to advanced multilevel degenerative spondylolysis, greatest at C4-5 and C5-6. Predominant right-sided facet arthrosis. Upper chest: Visualized upper chest demonstrates no acute abnormality. Partially visualized lung apices are grossly clear. Other: IMPRESSION: CT BRAIN: 1. No acute intracranial process. 2. Stable atrophy with chronic microvascular ischemic disease. CT CERVICAL SPINE: 1. No acute traumatic injury within cervical spine. 2. Stable alignment with advanced multilevel degenerative disc disease. Electronically Signed   By: Jeannine Boga M.D.   On: 12/19/2016 18:47   Dg Chest Port 1 View  Result Date: 12/20/2016 CLINICAL DATA:  Pulmonary edema EXAM: PORTABLE CHEST 1 VIEW COMPARISON:  December 19, 2016 pain March 21, 2016 FINDINGS: There is new airspace consolidation in the right upper lobe, right perihilar region common right base, concerning for pneumonia. There is underlying diffuse interstitial prominence with nodular interstitial disease throughout the lungs diffusely. This appearance is stable. Heart is mildly enlarged with pulmonary vascular within normal limits. No adenopathy. There is aortic atherosclerosis. No bone  lesions. IMPRESSION: Suspect multifocal pneumonia on the right. Areas of consolidation are new compared to 1 day prior on the right. There is underlying nodular interstitial disease and diffuse interstitial prominence. While there may be a degree of pulmonary edema, the appearance suggests noncardiogenic etiology for these changes. A degree of underlying diffuse pulmonary fibrosis is felt to be more likely. Heart remains slightly prominent with pulmonary vascularity within normal limits. There is aortic atherosclerosis. Electronically Signed   By: Lowella Grip III M.D.   On: 12/20/2016 08:59        Discharge Exam: Vitals:   12/22/16 2240 12/23/16 0502  BP: (!) 117/56 (!) 154/66  Pulse: 78 73  Resp: 20 18  Temp: 98.2 F (36.8 C) 98.2 F (36.8 C)   Vitals:   12/22/16 0813 12/22/16 1356 12/22/16 2240 12/23/16 0502  BP:  (!) 128/54 (!) 117/56 (!) 154/66  Pulse:  80 78 73  Resp:  18 20 18   Temp:  97.6 F (36.4 C) 98.2 F (36.8 C) 98.2 F (36.8 C)  TempSrc:  Oral Oral Oral  SpO2: 98% 95% 95% 96%  Weight:      Height:        General: Pt is alert, awake, not in acute distress Cardiovascular: RRR, S1/S2 +, no rubs, no gallops Respiratory: bibasilar rales, R>L; no wheezing, no rhonchi Abdominal: Soft, NT, ND, bowel sounds + Extremities: no edema, no cyanosis   The results of significant diagnostics from this hospitalization (including imaging, microbiology, ancillary and laboratory) are listed below for reference.    Significant Diagnostic Studies: Ct Abdomen Pelvis Wo Contrast  Result Date: 12/19/2016 CLINICAL DATA:  Fall.  Anemia. EXAM: CT ABDOMEN AND PELVIS WITHOUT CONTRAST TECHNIQUE: Multidetector CT imaging of the abdomen and pelvis was performed following the standard protocol without IV  contrast. COMPARISON:  03/12/2016 FINDINGS: Lower chest: Large hiatal hernia. Heart is normal size. Trace bilateral pleural effusions. Left lower lobe atelectasis. Hepatobiliary: No focal  hepatic abnormality. Gallbladder unremarkable. Pancreas: No focal abnormality or ductal dilatation. Spleen: No focal abnormality.  Normal size. Adrenals/Urinary Tract: No adrenal abnormality. No focal renal abnormality. No stones or hydronephrosis. Urinary bladder is unremarkable. Stomach/Bowel: Moderate stool burden throughout the colon. No evidence of obstruction. Vascular/Lymphatic: Aortic and iliac calcifications. No aneurysm or adenopathy. Reproductive: No visible focal abnormality. Other: No free fluid or free air.  No retroperitoneal hematoma. Musculoskeletal: No acute bony abnormality. IMPRESSION: Moderate stool burden throughout the colon. Trace bilateral pleural effusions.  Left lower lobe atelectasis. Large hiatal hernia. No acute findings in the abdomen or pelvis. No retroperitoneal hematoma. Electronically Signed   By: Rolm Baptise M.D.   On: 12/19/2016 20:06   Dg Chest 2 View  Result Date: 12/19/2016 CLINICAL DATA:  Unwitnessed fall in bathroom. Shortness of breath and hypoxia. EXAM: CHEST  2 VIEW COMPARISON:  Chest radiograph March 21, 2016 FINDINGS: Cardiac silhouette is mildly enlarged. Tortuous calcified aorta. Interstitial and alveolar airspace opacities for 7 prior imaging. Small probable LEFT pleural effusion. No pneumothorax. Patient rotated LEFT. Old LEFT humerus fracture. Large hiatal hernia. IMPRESSION: Increasing interstitial and alveolar airspace opacities associated with pulmonary edema and/or pneumonia. Stable cardiomegaly. Electronically Signed   By: Elon Alas M.D.   On: 12/19/2016 18:34   Ct Head Wo Contrast  Result Date: 12/19/2016 CLINICAL DATA:  Initial evaluation for acute trauma, unwitnessed fall. EXAM: CT HEAD WITHOUT CONTRAST CT CERVICAL SPINE WITHOUT CONTRAST TECHNIQUE: Multidetector CT imaging of the head and cervical spine was performed following the standard protocol without intravenous contrast. Multiplanar CT image reconstructions of the cervical spine were  also generated. COMPARISON:  Prior CT from 07/08/2016. FINDINGS: CT HEAD FINDINGS Brain: Mild for age cerebral atrophy with moderate chronic microvascular ischemic disease, stable. No acute intracranial hemorrhage. No evidence for acute large vessel territory infarct. No mass lesion, midline shift or mass effect. No hydrocephalus. No extra-axial fluid collection. Vascular: No hyperdense vessel. Scattered vascular calcifications noted within the carotid siphons. Skull: Scalp soft tissues demonstrate no acute abnormality. Calvarium intact. Sinuses/Orbits: Globes and orbital soft tissues within normal limits. Paranasal sinuses and mastoids are clear. CT CERVICAL SPINE FINDINGS Alignment: Straightening of the normal cervical lordosis. Trace anterolisthesis of C3 on C4, with trace retrolisthesis of C4 on C5. Skull base and vertebrae: Skullbase intact. C1-2 articulations are unchanged. Rotation of C1 on C2 likely positional. Dens is intact. Vertebral body heights maintained. The no acute fracture. Soft tissues and spinal canal: Visualized soft tissues of the neck demonstrate no acute abnormality. No prevertebral edema. Prominent vascular calcifications about the carotid bifurcations. Disc levels: Moderate to advanced multilevel degenerative spondylolysis, greatest at C4-5 and C5-6. Predominant right-sided facet arthrosis. Upper chest: Visualized upper chest demonstrates no acute abnormality. Partially visualized lung apices are grossly clear. Other: IMPRESSION: CT BRAIN: 1. No acute intracranial process. 2. Stable atrophy with chronic microvascular ischemic disease. CT CERVICAL SPINE: 1. No acute traumatic injury within cervical spine. 2. Stable alignment with advanced multilevel degenerative disc disease. Electronically Signed   By: Jeannine Boga M.D.   On: 12/19/2016 18:47   Ct Cervical Spine Wo Contrast  Result Date: 12/19/2016 CLINICAL DATA:  Initial evaluation for acute trauma, unwitnessed fall. EXAM: CT  HEAD WITHOUT CONTRAST CT CERVICAL SPINE WITHOUT CONTRAST TECHNIQUE: Multidetector CT imaging of the head and cervical spine was performed following the standard protocol  without intravenous contrast. Multiplanar CT image reconstructions of the cervical spine were also generated. COMPARISON:  Prior CT from 07/08/2016. FINDINGS: CT HEAD FINDINGS Brain: Mild for age cerebral atrophy with moderate chronic microvascular ischemic disease, stable. No acute intracranial hemorrhage. No evidence for acute large vessel territory infarct. No mass lesion, midline shift or mass effect. No hydrocephalus. No extra-axial fluid collection. Vascular: No hyperdense vessel. Scattered vascular calcifications noted within the carotid siphons. Skull: Scalp soft tissues demonstrate no acute abnormality. Calvarium intact. Sinuses/Orbits: Globes and orbital soft tissues within normal limits. Paranasal sinuses and mastoids are clear. CT CERVICAL SPINE FINDINGS Alignment: Straightening of the normal cervical lordosis. Trace anterolisthesis of C3 on C4, with trace retrolisthesis of C4 on C5. Skull base and vertebrae: Skullbase intact. C1-2 articulations are unchanged. Rotation of C1 on C2 likely positional. Dens is intact. Vertebral body heights maintained. The no acute fracture. Soft tissues and spinal canal: Visualized soft tissues of the neck demonstrate no acute abnormality. No prevertebral edema. Prominent vascular calcifications about the carotid bifurcations. Disc levels: Moderate to advanced multilevel degenerative spondylolysis, greatest at C4-5 and C5-6. Predominant right-sided facet arthrosis. Upper chest: Visualized upper chest demonstrates no acute abnormality. Partially visualized lung apices are grossly clear. Other: IMPRESSION: CT BRAIN: 1. No acute intracranial process. 2. Stable atrophy with chronic microvascular ischemic disease. CT CERVICAL SPINE: 1. No acute traumatic injury within cervical spine. 2. Stable alignment with  advanced multilevel degenerative disc disease. Electronically Signed   By: Jeannine Boga M.D.   On: 12/19/2016 18:47   Dg Chest Port 1 View  Result Date: 12/20/2016 CLINICAL DATA:  Pulmonary edema EXAM: PORTABLE CHEST 1 VIEW COMPARISON:  December 19, 2016 pain March 21, 2016 FINDINGS: There is new airspace consolidation in the right upper lobe, right perihilar region common right base, concerning for pneumonia. There is underlying diffuse interstitial prominence with nodular interstitial disease throughout the lungs diffusely. This appearance is stable. Heart is mildly enlarged with pulmonary vascular within normal limits. No adenopathy. There is aortic atherosclerosis. No bone lesions. IMPRESSION: Suspect multifocal pneumonia on the right. Areas of consolidation are new compared to 1 day prior on the right. There is underlying nodular interstitial disease and diffuse interstitial prominence. While there may be a degree of pulmonary edema, the appearance suggests noncardiogenic etiology for these changes. A degree of underlying diffuse pulmonary fibrosis is felt to be more likely. Heart remains slightly prominent with pulmonary vascularity within normal limits. There is aortic atherosclerosis. Electronically Signed   By: Lowella Grip III M.D.   On: 12/20/2016 08:59     Microbiology: Recent Results (from the past 240 hour(s))  Blood Culture (routine x 2)     Status: None (Preliminary result)   Collection Time: 12/19/16  5:58 PM  Result Value Ref Range Status   Specimen Description BLOOD LEFT ANTECUBITAL  Final   Special Requests   Final    BOTTLES DRAWN AEROBIC AND ANAEROBIC Blood Culture adequate volume   Culture   Final    NO GROWTH 3 DAYS Performed at [facility], [address] [place], [place] [address]    Report Status PENDING  Incomplete  Blood Culture (routine x 2)     Status: None (Preliminary result)   Collection Time: 12/19/16  6:26 PM  Result Value Ref Range Status     Specimen Description BLOOD BLOOD RIGHT FOREARM  Final   Special Requests   Final    BOTTLES DRAWN AEROBIC AND ANAEROBIC Blood Culture adequate volume  Culture   Final    NO GROWTH 3 DAYS Performed at [facility], [place] [address], [place], [place] [address]    Report Status PENDING  Incomplete  MRSA PCR Screening     Status: None   Collection Time: 12/19/16  8:46 PM  Result Value Ref Range Status   MRSA by PCR NEGATIVE NEGATIVE Final    Comment:        The GeneXpert MRSA Assay (FDA approved for NASAL specimens only), is one component of a comprehensive MRSA colonization surveillance program. It is not intended to diagnose MRSA infection nor to guide or monitor treatment for MRSA infections.      Labs: Basic Metabolic Panel:  Recent Labs Lab 12/19/16 1733 12/20/16 0815 12/21/16 0543 12/22/16 0527  NA 137 136 137 137  K 4.8 3.8 3.3* 3.1*  CL 106 102 103 101  CO2 22 25 26 26   GLUCOSE 108* 106* 95 80  BUN 26* 23* 20 21*  CREATININE 1.06 1.04 1.00 1.02  CALCIUM 9.1 8.5* 8.6* 8.6*  MG  --   --   --  2.1   Liver Function Tests:  Recent Labs Lab 12/19/16 1758  AST 31  ALT 18  ALKPHOS 72  BILITOT 0.4  PROT 6.3*  ALBUMIN 3.7    Recent Labs Lab 12/19/16 1758  LIPASE 20   No results for input(s): AMMONIA in the last 168 hours. CBC:  Recent Labs Lab 12/19/16 1733 12/19/16 2045 12/20/16 0800 12/21/16 1249  WBC 6.2 7.5 8.3 7.1  HGB 4.2* 4.2* 7.7* 8.2*  HCT 15.1* 15.1* 23.5* 26.5*  MCV 66.2* 66.2* 73.4* 74.0*  PLT 362 308 300 314   Cardiac Enzymes:  Recent Labs Lab 12/19/16 2045 12/20/16 0815  TROPONINI 0.17* 0.22*   BNP: Invalid input(s): POCBNP CBG:  Recent Labs Lab 12/19/16 1728 12/20/16 0739 12/21/16 0756 12/22/16 0749 12/23/16 0747  GLUCAP 105* 118* 95 69 101*    Time coordinating discharge:  Greater than 30 minutes  Signed:  Aydin Hink, DO [facility] Pager: [phone] 12/23/2016, 9:52 AM

## 2016-12-23 NOTE — Progress Notes (Addendum)
Patient is set to discharge back to [facility] today. Patient & daughter, Barry Dean made aware. Discharge packet given to RN, Ginger. [facility] called for transport.     Raynaldo Opitz, [facility] Clinical Social Worker cell #: [phone]

## 2016-12-23 NOTE — Progress Notes (Signed)
CSW left message for [name] re: patient returning today. CSW will await returned call.   Raynaldo Opitz, LCSW Clinical Social Worker cell #: [phone] (weekend coverage)

## 2016-12-23 NOTE — Progress Notes (Signed)
CSW received call back from [place] at [name], faxed over [place] discharge summary at her request - will await response re: return.    Raynaldo Opitz, LCSW [facility]  Clinical Social Worker cell #: [phone]

## 2016-12-23 NOTE — NC FL2 (Signed)
[place] LEVEL OF CARE SCREENING TOOL     IDENTIFICATION  Patient Name: Barry Dean Birthdate: September 09, 1923 Sex: male Admission Date (Current Location): 12/19/2016  [name] and [place] Number:  Herbalist and Address:  [facility],  [place] [address], [place]      Provider Number: [id number]  Attending Physician Name and Address:  Orson Eva, MD  Relative Name and Phone Number:       Current Level of Care: Hospital Recommended Level of Care: [place] Prior Approval Number:    Date Approved/Denied:   PASRR Number:    Discharge Plan: Other (Comment) (ALF)    Current Diagnoses: Patient Active Problem List   Diagnosis Date Noted  . Pressure injury of skin 12/22/2016  . Aspiration pneumonia of both lower lobes due to gastric secretions ([place]) 12/20/2016  . Symptomatic anemia 12/19/2016  . Fall 12/19/2016  . Elevated troponin 12/19/2016  . Acute on chronic diastolic heart failure ([place]) 03/22/2016  . Sepsis, unspecified organism ([place])   . Hypokalemia   . Hypomagnesemia   . Sepsis due to pneumonia ([place])   . Sepsis ([place]) 03/12/2016  . Nausea & vomiting 03/12/2016  . Diarrhea 03/12/2016  . Acute encephalopathy 03/12/2016  . Acute kidney injury ([place]) 03/12/2016  . Acute respiratory failure with hypoxia ([place]) 03/12/2016  . Septic shock ([place])   . Abnormality of gait 09/27/2015  . Tremor 09/27/2015  . Undiagnosed cardiac murmurs 03/06/2015  . Syncope and collapse 03/05/2015  . Parkinson's disease ([place]) 03/05/2015  . PAF (paroxysmal atrial fibrillation) ([place]) 03/05/2015  . Bradycardia 03/05/2015  . Deafness 03/05/2015  . Mild mitral regurgitation 03/05/2015  . Emesis, persistent 09/14/2014  . Benign essential HTN 09/14/2014  . Syncope 09/13/2013  . ?? Respiratory failure 09/13/2013  . Noninfectious gastroenteritis and colitis 11/12/2012  . Acute and chronic respiratory failure 11/12/2012  . Community  acquired pneumonia 11/12/2012  . HCAP (healthcare-associated pneumonia) 07/18/2011  . Seizure disorder ([place]) 07/18/2011  . HTN (hypertension) 07/18/2011  . Hyponatremia 07/18/2011    Orientation RESPIRATION BLADDER Height & Weight     Self  Normal Incontinent Weight: 180 lb 12.4 oz (82 kg) Height:  6\' 2"  (188 cm)  BEHAVIORAL SYMPTOMS/MOOD NEUROLOGICAL BOWEL NUTRITION STATUS    Convulsions/Seizures Incontinent Mechanical Soft  AMBULATORY STATUS COMMUNICATION OF NEEDS Skin   Limited Assist Verbally PU Stage and Appropriate Care                       Personal Care Assistance Level of Assistance  Bathing, Feeding, Dressing Bathing Assistance: Limited assistance Feeding assistance: Limited assistance Dressing Assistance: Limited assistance     Functional Limitations Info  Sight, Hearing Sight Info: Impaired Hearing Info: Impaired      SPECIAL CARE FACTORS FREQUENCY                       Contractures Contractures Info: Not present    Additional Factors Info  Code Status, Allergies Code Status Info: DNR Allergies Info: Topamax, Uroxatral Alfuzosin Hydrochloride, Valium           Current Medications (12/23/2016):  This is the current hospital active medication list Current Facility-Administered Medications  Medication Dose Route Frequency Provider Last Rate Last Dose  . acetaminophen (TYLENOL) tablet 650 mg  650 mg Oral Q6H PRN Ivor Costa, MD   650 mg at 12/20/16 1533   Or  . acetaminophen (TYLENOL) suppository 650 mg  650  mg Rectal Q6H PRN Ivor Costa, MD      . amoxicillin-clavulanate (AUGMENTIN) 875-125 MG per tablet 1 tablet  1 tablet Oral Q12H Orson Eva, MD   1 tablet at 12/23/16 0847  . docusate sodium (COLACE) capsule 100 mg  100 mg Oral BID Orson Eva, MD   100 mg at 12/23/16 0848  . ferrous sulfate tablet 325 mg  325 mg Oral BID WC Orson Eva, MD   325 mg at 12/23/16 0847  . hydrALAZINE (APRESOLINE) injection 5 mg  5 mg Intravenous Q2H PRN Ivor Costa, MD       . ipratropium-albuterol (DUONEB) 0.5-2.5 (3) MG/3ML nebulizer solution 3 mL  3 mL Nebulization Q4H PRN Orson Eva, MD      . lacosamide (VIMPAT) tablet 50 mg  50 mg Oral Daily Minda Ditto, RPH   50 mg at 12/23/16 0848   And  . lacosamide (VIMPAT) tablet 150 mg  150 mg Oral QHS Minda Ditto, RPH   150 mg at 12/22/16 2239  . lamoTRIgine (LAMICTAL) tablet 100 mg  100 mg Oral BID Ivor Costa, MD   100 mg at 12/23/16 0847  . nitroGLYCERIN (NITROSTAT) SL tablet 0.4 mg  0.4 mg Sublingual Q5 min PRN Ivor Costa, MD      . ondansetron [facility]) tablet 4 mg  4 mg Oral Q6H PRN Ivor Costa, MD       Or  . ondansetron [facility]) injection 4 mg  4 mg Intravenous Q6H PRN Ivor Costa, MD      . pantoprazole (PROTONIX) EC tablet 20 mg  20 mg Oral Daily Ivor Costa, MD   20 mg at 12/23/16 0847  . senna-docusate (Senokot-S) tablet 1 tablet  1 tablet Oral QHS PRN Ivor Costa, MD      . sodium chloride flush (NS) 0.9 % injection 3 mL  3 mL Intravenous Q12H Ivor Costa, MD   3 mL at 12/23/16 0849  . vitamin B-12 (CYANOCOBALAMIN) tablet 2,000 mcg  2,000 mcg Oral Daily Ivor Costa, MD   2,000 mcg at 12/23/16 0211     Discharge Medications: Please see discharge summary for a list of discharge medications.  Relevant Imaging Results:  Relevant Lab Results:   Additional Information SSN [id number]  Standley Brooking, LCSW

## 2016-12-24 DIAGNOSIS — R2681 Unsteadiness on feet: Secondary | ICD-10-CM | POA: Diagnosis not present

## 2016-12-24 DIAGNOSIS — M6281 Muscle weakness (generalized): Secondary | ICD-10-CM | POA: Diagnosis not present

## 2016-12-24 LAB — CULTURE, BLOOD (ROUTINE X 2)
CULTURE: NO GROWTH
CULTURE: NO GROWTH
SPECIAL REQUESTS: ADEQUATE
Special Requests: ADEQUATE

## 2016-12-25 DIAGNOSIS — Z993 Dependence on wheelchair: Secondary | ICD-10-CM | POA: Diagnosis not present

## 2016-12-25 DIAGNOSIS — R269 Unspecified abnormalities of gait and mobility: Secondary | ICD-10-CM | POA: Diagnosis not present

## 2016-12-25 DIAGNOSIS — I10 Essential (primary) hypertension: Secondary | ICD-10-CM | POA: Diagnosis not present

## 2016-12-25 DIAGNOSIS — G4089 Other seizures: Secondary | ICD-10-CM | POA: Diagnosis not present

## 2016-12-25 DIAGNOSIS — J189 Pneumonia, unspecified organism: Secondary | ICD-10-CM | POA: Diagnosis not present

## 2016-12-28 DIAGNOSIS — M6281 Muscle weakness (generalized): Secondary | ICD-10-CM | POA: Diagnosis not present

## 2016-12-28 DIAGNOSIS — R2681 Unsteadiness on feet: Secondary | ICD-10-CM | POA: Diagnosis not present

## 2017-01-01 DIAGNOSIS — M6281 Muscle weakness (generalized): Secondary | ICD-10-CM | POA: Diagnosis not present

## 2017-01-01 DIAGNOSIS — R6889 Other general symptoms and signs: Secondary | ICD-10-CM | POA: Diagnosis not present

## 2017-01-01 DIAGNOSIS — R269 Unspecified abnormalities of gait and mobility: Secondary | ICD-10-CM | POA: Diagnosis not present

## 2017-01-01 DIAGNOSIS — D649 Anemia, unspecified: Secondary | ICD-10-CM | POA: Diagnosis not present

## 2017-01-01 DIAGNOSIS — J449 Chronic obstructive pulmonary disease, unspecified: Secondary | ICD-10-CM | POA: Diagnosis not present

## 2017-01-01 DIAGNOSIS — G4089 Other seizures: Secondary | ICD-10-CM | POA: Diagnosis not present

## 2017-01-01 DIAGNOSIS — R7989 Other specified abnormal findings of blood chemistry: Secondary | ICD-10-CM | POA: Diagnosis not present

## 2017-01-01 DIAGNOSIS — R2681 Unsteadiness on feet: Secondary | ICD-10-CM | POA: Diagnosis not present

## 2017-01-02 DIAGNOSIS — M6281 Muscle weakness (generalized): Secondary | ICD-10-CM | POA: Diagnosis not present

## 2017-01-02 DIAGNOSIS — R2681 Unsteadiness on feet: Secondary | ICD-10-CM | POA: Diagnosis not present

## 2017-01-03 DIAGNOSIS — M6281 Muscle weakness (generalized): Secondary | ICD-10-CM | POA: Diagnosis not present

## 2017-01-03 DIAGNOSIS — R2681 Unsteadiness on feet: Secondary | ICD-10-CM | POA: Diagnosis not present

## 2017-01-08 DIAGNOSIS — R2681 Unsteadiness on feet: Secondary | ICD-10-CM | POA: Diagnosis not present

## 2017-01-08 DIAGNOSIS — M6281 Muscle weakness (generalized): Secondary | ICD-10-CM | POA: Diagnosis not present

## 2017-01-09 DIAGNOSIS — M6281 Muscle weakness (generalized): Secondary | ICD-10-CM | POA: Diagnosis not present

## 2017-01-09 DIAGNOSIS — R2681 Unsteadiness on feet: Secondary | ICD-10-CM | POA: Diagnosis not present

## 2017-01-10 DIAGNOSIS — R2681 Unsteadiness on feet: Secondary | ICD-10-CM | POA: Diagnosis not present

## 2017-01-10 DIAGNOSIS — M6281 Muscle weakness (generalized): Secondary | ICD-10-CM | POA: Diagnosis not present

## 2017-01-15 DIAGNOSIS — M6281 Muscle weakness (generalized): Secondary | ICD-10-CM | POA: Diagnosis not present

## 2017-01-15 DIAGNOSIS — R2681 Unsteadiness on feet: Secondary | ICD-10-CM | POA: Diagnosis not present

## 2017-01-16 DIAGNOSIS — M6281 Muscle weakness (generalized): Secondary | ICD-10-CM | POA: Diagnosis not present

## 2017-01-16 DIAGNOSIS — R2681 Unsteadiness on feet: Secondary | ICD-10-CM | POA: Diagnosis not present

## 2017-01-17 DIAGNOSIS — R2681 Unsteadiness on feet: Secondary | ICD-10-CM | POA: Diagnosis not present

## 2017-01-17 DIAGNOSIS — M6281 Muscle weakness (generalized): Secondary | ICD-10-CM | POA: Diagnosis not present

## 2017-01-23 DIAGNOSIS — M6281 Muscle weakness (generalized): Secondary | ICD-10-CM | POA: Diagnosis not present

## 2017-01-23 DIAGNOSIS — R2681 Unsteadiness on feet: Secondary | ICD-10-CM | POA: Diagnosis not present

## 2017-01-24 DIAGNOSIS — R2681 Unsteadiness on feet: Secondary | ICD-10-CM | POA: Diagnosis not present

## 2017-01-24 DIAGNOSIS — M6281 Muscle weakness (generalized): Secondary | ICD-10-CM | POA: Diagnosis not present

## 2017-01-25 DIAGNOSIS — M6281 Muscle weakness (generalized): Secondary | ICD-10-CM | POA: Diagnosis not present

## 2017-01-25 DIAGNOSIS — R2681 Unsteadiness on feet: Secondary | ICD-10-CM | POA: Diagnosis not present

## 2017-01-27 DIAGNOSIS — R2681 Unsteadiness on feet: Secondary | ICD-10-CM | POA: Diagnosis not present

## 2017-01-27 DIAGNOSIS — M6281 Muscle weakness (generalized): Secondary | ICD-10-CM | POA: Diagnosis not present

## 2017-01-28 DIAGNOSIS — R2681 Unsteadiness on feet: Secondary | ICD-10-CM | POA: Diagnosis not present

## 2017-01-28 DIAGNOSIS — M6281 Muscle weakness (generalized): Secondary | ICD-10-CM | POA: Diagnosis not present

## 2017-01-29 DIAGNOSIS — M6281 Muscle weakness (generalized): Secondary | ICD-10-CM | POA: Diagnosis not present

## 2017-01-29 DIAGNOSIS — R2681 Unsteadiness on feet: Secondary | ICD-10-CM | POA: Diagnosis not present

## 2017-01-30 DIAGNOSIS — R2681 Unsteadiness on feet: Secondary | ICD-10-CM | POA: Diagnosis not present

## 2017-01-30 DIAGNOSIS — M6281 Muscle weakness (generalized): Secondary | ICD-10-CM | POA: Diagnosis not present

## 2017-02-05 DIAGNOSIS — R799 Abnormal finding of blood chemistry, unspecified: Secondary | ICD-10-CM | POA: Diagnosis not present

## 2017-02-05 DIAGNOSIS — R2681 Unsteadiness on feet: Secondary | ICD-10-CM | POA: Diagnosis not present

## 2017-02-05 DIAGNOSIS — M6281 Muscle weakness (generalized): Secondary | ICD-10-CM | POA: Diagnosis not present

## 2017-02-06 DIAGNOSIS — R2681 Unsteadiness on feet: Secondary | ICD-10-CM | POA: Diagnosis not present

## 2017-02-06 DIAGNOSIS — M6281 Muscle weakness (generalized): Secondary | ICD-10-CM | POA: Diagnosis not present

## 2017-02-07 DIAGNOSIS — M6281 Muscle weakness (generalized): Secondary | ICD-10-CM | POA: Diagnosis not present

## 2017-02-07 DIAGNOSIS — R2681 Unsteadiness on feet: Secondary | ICD-10-CM | POA: Diagnosis not present

## 2017-02-08 DIAGNOSIS — R2681 Unsteadiness on feet: Secondary | ICD-10-CM | POA: Diagnosis not present

## 2017-02-08 DIAGNOSIS — M6281 Muscle weakness (generalized): Secondary | ICD-10-CM | POA: Diagnosis not present

## 2017-02-11 DIAGNOSIS — R2681 Unsteadiness on feet: Secondary | ICD-10-CM | POA: Diagnosis not present

## 2017-02-11 DIAGNOSIS — M6281 Muscle weakness (generalized): Secondary | ICD-10-CM | POA: Diagnosis not present

## 2017-02-13 DIAGNOSIS — M6281 Muscle weakness (generalized): Secondary | ICD-10-CM | POA: Diagnosis not present

## 2017-02-13 DIAGNOSIS — R2681 Unsteadiness on feet: Secondary | ICD-10-CM | POA: Diagnosis not present

## 2017-02-14 DIAGNOSIS — R2681 Unsteadiness on feet: Secondary | ICD-10-CM | POA: Diagnosis not present

## 2017-02-14 DIAGNOSIS — M6281 Muscle weakness (generalized): Secondary | ICD-10-CM | POA: Diagnosis not present

## 2017-02-15 DIAGNOSIS — M6281 Muscle weakness (generalized): Secondary | ICD-10-CM | POA: Diagnosis not present

## 2017-02-15 DIAGNOSIS — R2681 Unsteadiness on feet: Secondary | ICD-10-CM | POA: Diagnosis not present

## 2017-02-18 DIAGNOSIS — R2681 Unsteadiness on feet: Secondary | ICD-10-CM | POA: Diagnosis not present

## 2017-02-18 DIAGNOSIS — M6281 Muscle weakness (generalized): Secondary | ICD-10-CM | POA: Diagnosis not present

## 2017-02-19 DIAGNOSIS — M6281 Muscle weakness (generalized): Secondary | ICD-10-CM | POA: Diagnosis not present

## 2017-02-19 DIAGNOSIS — R2681 Unsteadiness on feet: Secondary | ICD-10-CM | POA: Diagnosis not present

## 2017-02-20 DIAGNOSIS — R2681 Unsteadiness on feet: Secondary | ICD-10-CM | POA: Diagnosis not present

## 2017-02-20 DIAGNOSIS — M6281 Muscle weakness (generalized): Secondary | ICD-10-CM | POA: Diagnosis not present

## 2017-02-21 DIAGNOSIS — M6281 Muscle weakness (generalized): Secondary | ICD-10-CM | POA: Diagnosis not present

## 2017-02-21 DIAGNOSIS — R2681 Unsteadiness on feet: Secondary | ICD-10-CM | POA: Diagnosis not present

## 2017-02-22 DIAGNOSIS — R2681 Unsteadiness on feet: Secondary | ICD-10-CM | POA: Diagnosis not present

## 2017-02-22 DIAGNOSIS — M6281 Muscle weakness (generalized): Secondary | ICD-10-CM | POA: Diagnosis not present

## 2017-02-25 DIAGNOSIS — R2681 Unsteadiness on feet: Secondary | ICD-10-CM | POA: Diagnosis not present

## 2017-02-25 DIAGNOSIS — M6281 Muscle weakness (generalized): Secondary | ICD-10-CM | POA: Diagnosis not present

## 2017-02-26 DIAGNOSIS — M6281 Muscle weakness (generalized): Secondary | ICD-10-CM | POA: Diagnosis not present

## 2017-02-26 DIAGNOSIS — R2681 Unsteadiness on feet: Secondary | ICD-10-CM | POA: Diagnosis not present

## 2017-02-28 DIAGNOSIS — R2681 Unsteadiness on feet: Secondary | ICD-10-CM | POA: Diagnosis not present

## 2017-02-28 DIAGNOSIS — M6281 Muscle weakness (generalized): Secondary | ICD-10-CM | POA: Diagnosis not present

## 2017-03-05 DIAGNOSIS — M6281 Muscle weakness (generalized): Secondary | ICD-10-CM | POA: Diagnosis not present

## 2017-03-05 DIAGNOSIS — R2681 Unsteadiness on feet: Secondary | ICD-10-CM | POA: Diagnosis not present

## 2017-03-06 DIAGNOSIS — M6281 Muscle weakness (generalized): Secondary | ICD-10-CM | POA: Diagnosis not present

## 2017-03-06 DIAGNOSIS — R2681 Unsteadiness on feet: Secondary | ICD-10-CM | POA: Diagnosis not present

## 2017-03-07 DIAGNOSIS — M6281 Muscle weakness (generalized): Secondary | ICD-10-CM | POA: Diagnosis not present

## 2017-03-07 DIAGNOSIS — R2681 Unsteadiness on feet: Secondary | ICD-10-CM | POA: Diagnosis not present

## 2017-03-08 DIAGNOSIS — R2681 Unsteadiness on feet: Secondary | ICD-10-CM | POA: Diagnosis not present

## 2017-03-08 DIAGNOSIS — M6281 Muscle weakness (generalized): Secondary | ICD-10-CM | POA: Diagnosis not present

## 2017-03-11 DIAGNOSIS — M6281 Muscle weakness (generalized): Secondary | ICD-10-CM | POA: Diagnosis not present

## 2017-03-11 DIAGNOSIS — R2681 Unsteadiness on feet: Secondary | ICD-10-CM | POA: Diagnosis not present

## 2017-03-12 DIAGNOSIS — Z9981 Dependence on supplemental oxygen: Secondary | ICD-10-CM | POA: Diagnosis not present

## 2017-03-12 DIAGNOSIS — H6061 Unspecified chronic otitis externa, right ear: Secondary | ICD-10-CM | POA: Diagnosis not present

## 2017-03-12 DIAGNOSIS — I10 Essential (primary) hypertension: Secondary | ICD-10-CM | POA: Diagnosis not present

## 2017-03-12 DIAGNOSIS — J449 Chronic obstructive pulmonary disease, unspecified: Secondary | ICD-10-CM | POA: Diagnosis not present

## 2017-03-12 DIAGNOSIS — R2681 Unsteadiness on feet: Secondary | ICD-10-CM | POA: Diagnosis not present

## 2017-03-12 DIAGNOSIS — D6489 Other specified anemias: Secondary | ICD-10-CM | POA: Diagnosis not present

## 2017-03-12 DIAGNOSIS — G4089 Other seizures: Secondary | ICD-10-CM | POA: Diagnosis not present

## 2017-03-12 DIAGNOSIS — R799 Abnormal finding of blood chemistry, unspecified: Secondary | ICD-10-CM | POA: Diagnosis not present

## 2017-03-12 DIAGNOSIS — H6121 Impacted cerumen, right ear: Secondary | ICD-10-CM | POA: Diagnosis not present

## 2017-03-12 DIAGNOSIS — M6281 Muscle weakness (generalized): Secondary | ICD-10-CM | POA: Diagnosis not present

## 2017-03-12 DIAGNOSIS — R1310 Dysphagia, unspecified: Secondary | ICD-10-CM | POA: Diagnosis not present

## 2017-03-12 DIAGNOSIS — Z993 Dependence on wheelchair: Secondary | ICD-10-CM | POA: Diagnosis not present

## 2017-03-13 DIAGNOSIS — M6281 Muscle weakness (generalized): Secondary | ICD-10-CM | POA: Diagnosis not present

## 2017-03-13 DIAGNOSIS — R2681 Unsteadiness on feet: Secondary | ICD-10-CM | POA: Diagnosis not present

## 2017-03-14 DIAGNOSIS — M6281 Muscle weakness (generalized): Secondary | ICD-10-CM | POA: Diagnosis not present

## 2017-03-14 DIAGNOSIS — R2681 Unsteadiness on feet: Secondary | ICD-10-CM | POA: Diagnosis not present

## 2017-03-15 DIAGNOSIS — R2681 Unsteadiness on feet: Secondary | ICD-10-CM | POA: Diagnosis not present

## 2017-03-15 DIAGNOSIS — M6281 Muscle weakness (generalized): Secondary | ICD-10-CM | POA: Diagnosis not present

## 2017-03-18 DIAGNOSIS — R2681 Unsteadiness on feet: Secondary | ICD-10-CM | POA: Diagnosis not present

## 2017-03-18 DIAGNOSIS — M6281 Muscle weakness (generalized): Secondary | ICD-10-CM | POA: Diagnosis not present

## 2017-03-19 DIAGNOSIS — R2681 Unsteadiness on feet: Secondary | ICD-10-CM | POA: Diagnosis not present

## 2017-03-19 DIAGNOSIS — M6281 Muscle weakness (generalized): Secondary | ICD-10-CM | POA: Diagnosis not present

## 2017-03-20 DIAGNOSIS — M6281 Muscle weakness (generalized): Secondary | ICD-10-CM | POA: Diagnosis not present

## 2017-03-20 DIAGNOSIS — R2681 Unsteadiness on feet: Secondary | ICD-10-CM | POA: Diagnosis not present

## 2017-03-21 DIAGNOSIS — R2681 Unsteadiness on feet: Secondary | ICD-10-CM | POA: Diagnosis not present

## 2017-03-21 DIAGNOSIS — M6281 Muscle weakness (generalized): Secondary | ICD-10-CM | POA: Diagnosis not present

## 2017-03-26 DIAGNOSIS — M6281 Muscle weakness (generalized): Secondary | ICD-10-CM | POA: Diagnosis not present

## 2017-03-26 DIAGNOSIS — R2681 Unsteadiness on feet: Secondary | ICD-10-CM | POA: Diagnosis not present

## 2017-04-09 DIAGNOSIS — R799 Abnormal finding of blood chemistry, unspecified: Secondary | ICD-10-CM | POA: Diagnosis not present

## 2017-05-07 DIAGNOSIS — Z993 Dependence on wheelchair: Secondary | ICD-10-CM | POA: Diagnosis not present

## 2017-05-07 DIAGNOSIS — G20 Parkinson's disease: Secondary | ICD-10-CM | POA: Diagnosis not present

## 2017-05-07 DIAGNOSIS — G4089 Other seizures: Secondary | ICD-10-CM | POA: Diagnosis not present

## 2017-05-07 DIAGNOSIS — K5901 Slow transit constipation: Secondary | ICD-10-CM | POA: Diagnosis not present

## 2017-05-07 DIAGNOSIS — R0902 Hypoxemia: Secondary | ICD-10-CM | POA: Diagnosis not present

## 2017-05-07 DIAGNOSIS — I10 Essential (primary) hypertension: Secondary | ICD-10-CM | POA: Diagnosis not present

## 2017-05-14 DIAGNOSIS — G4089 Other seizures: Secondary | ICD-10-CM | POA: Diagnosis not present

## 2017-05-14 DIAGNOSIS — G20 Parkinson's disease: Secondary | ICD-10-CM | POA: Diagnosis not present

## 2017-05-14 DIAGNOSIS — R6889 Other general symptoms and signs: Secondary | ICD-10-CM | POA: Diagnosis not present

## 2017-05-14 DIAGNOSIS — I159 Secondary hypertension, unspecified: Secondary | ICD-10-CM | POA: Diagnosis not present

## 2017-05-14 DIAGNOSIS — J9600 Acute respiratory failure, unspecified whether with hypoxia or hypercapnia: Secondary | ICD-10-CM | POA: Diagnosis not present

## 2017-05-18 ENCOUNTER — Emergency Department (HOSPITAL_COMMUNITY)
Admission: EM | Admit: 2017-05-18 | Discharge: 2017-05-18 | Disposition: A | Discharge status: Home / Self Care | Payer: Medicare Other | Attending: Emergency Medicine | Admitting: Emergency Medicine

## 2017-05-18 ENCOUNTER — Emergency Department (HOSPITAL_COMMUNITY): Payer: Medicare Other

## 2017-05-18 ENCOUNTER — Encounter (HOSPITAL_COMMUNITY): Payer: Self-pay | Admitting: Emergency Medicine

## 2017-05-18 DIAGNOSIS — Z7982 Long term (current) use of aspirin: Secondary | ICD-10-CM | POA: Diagnosis not present

## 2017-05-18 DIAGNOSIS — I6789 Other cerebrovascular disease: Secondary | ICD-10-CM | POA: Diagnosis not present

## 2017-05-18 DIAGNOSIS — Z79899 Other long term (current) drug therapy: Secondary | ICD-10-CM | POA: Diagnosis not present

## 2017-05-18 DIAGNOSIS — I5033 Acute on chronic diastolic (congestive) heart failure: Secondary | ICD-10-CM | POA: Diagnosis not present

## 2017-05-18 DIAGNOSIS — G92 Toxic encephalopathy: Secondary | ICD-10-CM | POA: Diagnosis not present

## 2017-05-18 DIAGNOSIS — I110 Hypertensive heart disease with heart failure: Secondary | ICD-10-CM | POA: Diagnosis not present

## 2017-05-18 DIAGNOSIS — R404 Transient alteration of awareness: Secondary | ICD-10-CM | POA: Diagnosis not present

## 2017-05-18 DIAGNOSIS — T670XXA Heatstroke and sunstroke, initial encounter: Secondary | ICD-10-CM | POA: Diagnosis not present

## 2017-05-18 DIAGNOSIS — F0390 Unspecified dementia without behavioral disturbance: Secondary | ICD-10-CM | POA: Insufficient documentation

## 2017-05-18 DIAGNOSIS — G459 Transient cerebral ischemic attack, unspecified: Secondary | ICD-10-CM | POA: Diagnosis not present

## 2017-05-18 DIAGNOSIS — R4182 Altered mental status, unspecified: Secondary | ICD-10-CM | POA: Diagnosis not present

## 2017-05-18 LAB — URINALYSIS, ROUTINE W REFLEX MICROSCOPIC
Bilirubin Urine: NEGATIVE
Glucose, UA: NEGATIVE mg/dL
Hgb urine dipstick: NEGATIVE
Ketones, ur: NEGATIVE mg/dL
Leukocytes, UA: NEGATIVE
NITRITE: NEGATIVE
Protein, ur: NEGATIVE mg/dL
SPECIFIC GRAVITY, URINE: 1.01 (ref 1.005–1.030)
pH: 8 (ref 5.0–8.0)

## 2017-05-18 LAB — COMPREHENSIVE METABOLIC PANEL
ALBUMIN: 4.4 g/dL (ref 3.5–5.0)
ALK PHOS: 78 U/L (ref 38–126)
ALT: 19 U/L (ref 17–63)
ANION GAP: 12 (ref 5–15)
AST: 28 U/L (ref 15–41)
BUN: 10 mg/dL (ref 6–20)
CALCIUM: 9.5 mg/dL (ref 8.9–10.3)
CO2: 23 mmol/L (ref 22–32)
Chloride: 96 mmol/L — ABNORMAL LOW (ref 101–111)
Creatinine, Ser: 0.83 mg/dL (ref 0.61–1.24)
GFR calc Af Amer: >60 mL/min (ref >60)
GFR calc non Af Amer: >60 mL/min (ref >60)
GLUCOSE: 109 mg/dL — AB (ref 65–99)
POTASSIUM: 4.1 mmol/L (ref 3.5–5.1)
SODIUM: 131 mmol/L — AB (ref 135–145)
Total Bilirubin: 0.7 mg/dL (ref 0.3–1.2)
Total Protein: 7.7 g/dL (ref 6.5–8.1)

## 2017-05-18 LAB — CBC
HCT: 44.1 % (ref 39.0–52.0)
Hemoglobin: 15.4 g/dL (ref 13.0–17.0)
MCH: 32.2 pg (ref 26.0–34.0)
MCHC: 34.9 g/dL (ref 30.0–36.0)
MCV: 92.3 fL (ref 78.0–100.0)
PLATELETS: 246 10*3/uL (ref 150–400)
RBC: 4.78 MIL/uL (ref 4.22–5.81)
RDW: 13.7 % (ref 11.5–15.5)
WBC: 6.6 10*3/uL (ref 4.0–10.5)

## 2017-05-18 LAB — DIFFERENTIAL
BASOS ABS: 0 10*3/uL (ref 0.0–0.1)
Basophils Relative: 0 %
EOS PCT: 1 %
Eosinophils Absolute: 0 10*3/uL (ref 0.0–0.7)
LYMPHS PCT: 29 %
Lymphs Abs: 1.9 10*3/uL (ref 0.7–4.0)
Monocytes Absolute: 0.5 10*3/uL (ref 0.1–1.0)
Monocytes Relative: 8 %
NEUTROS ABS: 4.1 10*3/uL (ref 1.7–7.7)
NEUTROS PCT: 62 %

## 2017-05-18 LAB — APTT: APTT: 29 s (ref 24–36)

## 2017-05-18 LAB — I-STAT CHEM 8, ED
BUN: 11 mg/dL (ref 6–20)
CHLORIDE: 95 mmol/L — AB (ref 101–111)
Calcium, Ion: 1.05 mmol/L — ABNORMAL LOW (ref 1.15–1.40)
Creatinine, Ser: 0.7 mg/dL (ref 0.61–1.24)
Glucose, Bld: 106 mg/dL — ABNORMAL HIGH (ref 65–99)
HCT: 47 % (ref 39.0–52.0)
Hemoglobin: 16 g/dL (ref 13.0–17.0)
POTASSIUM: 4 mmol/L (ref 3.5–5.1)
SODIUM: 133 mmol/L — AB (ref 135–145)
TCO2: 26 mmol/L (ref 22–32)

## 2017-05-18 LAB — PROTIME-INR
INR: 0.98
PROTHROMBIN TIME: 12.9 s (ref 11.4–15.2)

## 2017-05-18 LAB — I-STAT TROPONIN, ED: Troponin i, poc: 0.03 ng/mL (ref 0.00–0.08)

## 2017-05-18 LAB — CBG MONITORING, ED: GLUCOSE-CAPILLARY: 100 mg/dL — AB (ref 65–99)

## 2017-05-18 MED ORDER — AMLODIPINE BESYLATE 5 MG PO TABS
5.0000 mg | ORAL_TABLET | Freq: Once | ORAL | Status: AC
  Administered 2017-05-18: 5 mg via ORAL
  Filled 2017-05-18: qty 1

## 2017-05-18 NOTE — ED Triage Notes (Addendum)
Pt in from [facility] via [name] as code stroke with LSN at 2000 last night. Per EMS, the pt is usually a&ox2, wakes up at 0600, uses walker and MAE's equally at baseline. This am, pt was woken up at 0800 and nonverbal, minimally responding to commands, reported L side wkns and AMS. Hx of Parkinsons and seizures. EMS states pt has possible UTI per [name]. Airway intact, CBG 89, CT on arrival

## 2017-05-18 NOTE — Consult Note (Addendum)
Neurology Consultation  Reason for Consult: Code stroke, altered mental status -code stroke was paged out at [place] hours on 05/18/2017 Referring Physician: Dr. Jeanell Sparrow  CC: Altered mental status  History is obtained from: Chart and EMS  HPI: Barry Dean is a 83 y.o. 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 The patient is usually at baseline wheelchair-bound and uses walker to transfer. He follows with [facility] and is seen by Barry Dean. He was at the nursing home, being evaluated presumably for a UTI-UA sent and results pending. He went to bed last night at 8 PM when he was last seen normal. He usually wakes up at 6 AM, this morning he did not wake up and when the staff and to check on him, he would not open his eyes. They called and EMS immediately and EMS noted that he was completely nonverbal and unresponsive, hence brought him as a code stroke-LVO. Upon my initial assessment at the emergency room bridge, the patient was able to open eyes to voice. He was able to me his name and age. He was taken in for a stat noncontrast CT of the head, which was unchanged from his prior imaging and showed no evidence of bleed or any evidence of early ischemic stroke changes. The patient continued to come around, and was able to ultimately move all 4 of his extremities, which she was not doing before. No witnessed seizure activity. No family at bedside to provide further history at this time.   LKW: 8 PM on 05/17/2017 tpa given?: no, outside the window Premorbid modified Rankin scale (mRS): 4  JYN:WGNFAO to obtain due to altered mental status.   Past Medical History:  Diagnosis Date  . Abnormality of gait    . Anxiety   . Arthritis   . Atrial fibrillation ([place])   . BPH (benign prostatic hyperplasia)   . Deafness   . Dehydration   . Depression   . GERD (gastroesophageal reflux disease)   . Hypertension   . Melanoma ([place])   . Mitral regurgitation   . Reflux   . Seizure ([place])   . Seizures ([place])   . Syncope   . Tremor 09/27/2015  . Tremor, essential     Family History  Problem Relation Age of Onset  . Cancer Mother        Skin cancer  . Stroke Father   . Parkinsonism Sister   . Diabetes Brother     Social History:   reports that he has never smoked. He has never used smokeless tobacco. He reports that he does not drink alcohol or use drugs.  Medications No current facility-administered medications for this encounter.   Current Outpatient Prescriptions:  .  amLODipine (NORVASC) 5 MG tablet, Take 1 tablet (5 mg total) by mouth daily., Disp: 30 tablet, Rfl: 0 .  aspirin 81 MG chewable tablet, Chew 1 tablet (81 mg total) by mouth every evening., Disp: , Rfl:  .  ferrous sulfate 325 (65 FE) MG tablet, Take 1 tablet (325 mg total) by mouth 2 (two) times daily with a meal., Disp: 60 tablet, Rfl: 0 .  ipratropium-albuterol (DUONEB) 0.5-2.5 (3) MG/3ML SOLN, Take 3 mLs by nebulization every 6 (six) hours as needed. (Patient  taking differently: Take 3 mLs by nebulization every 6 (six) hours as needed (for shortness of breath). ), Disp: 360 mL, Rfl: 0 .  lacosamide (VIMPAT) 50 MG TABS tablet, Take 1 tablet in the AM (50 mg) and 3 tablets (150 mg ) in the PM. (Patient taking differently: Take 50-150 mg by mouth See admin instructions. Take 50 mg by mouth in the morning and take 150 mg by mouth at bedtime), Disp: 120 tablet, Rfl: 4 .  lamoTRIgine (LAMICTAL) 100 MG tablet, Take 1 tablet (100 mg total) by mouth 2 (two) times daily., Disp: 60 tablet, Rfl: 5 .  mineral oil external liquid, Place 3 drops into both ears every Friday. , Disp: , Rfl:  .  OXYGEN, Inhale 2 L into the lungs See admin  instructions. Use 2L continuous at bedtime. Use 2L as needed for shortness of breath., Disp: , Rfl:  .  pantoprazole (PROTONIX) 20 MG tablet, Take 20 mg by mouth daily., Disp: , Rfl:  .  polyethylene glycol (MIRALAX) packet, Take 17 g by mouth daily., Disp: 28 each, Rfl: 0 .  sennosides-docusate sodium (SENOKOT-S) 8.6-50 MG tablet, Take 1 tablet by mouth at bedtime as needed for constipation. , Disp: , Rfl:  .  vitamin B-12 (CYANOCOBALAMIN) 1000 MCG tablet, Take 2,000 mcg by mouth daily., Disp: , Rfl:  .  feeding supplement, ENSURE ENLIVE, (ENSURE ENLIVE) LIQD, Take 237 mLs by mouth daily. (Patient not taking: Reported on 05/18/2017), Disp: 237 mL, Rfl: 0   Exam: Current vital signs: BP (!) 190/99   Resp (!) 34   Ht 5\' 10"  (1.778 m)   Wt 84.4 kg (186 lb 1.1 oz)   SpO2 98%   BMI 26.70 kg/m  Vital signs in last 24 hours: Resp:  [34] 34 (09/22 0853) BP: (190)/(99) 190/99 (09/22 0853) SpO2:  [98 %] 98 % (09/22 0856) Weight:  [84.4 kg (186 lb 1.1 oz)] 84.4 kg (186 lb 1.1 oz) (09/22 0859)  GENERAL: Well-developed well-nourished man, hunched over to the left in the gurney, eyes closed, opens eyes to voice. HEENT: - Normocephalic and atraumatic, dry mm, no LN++, no Thyromegally. He has a fairly stiff neck and lies down with neck flexed over the bed LUNGS - Clear to auscultation bilaterally with no wheezes CV - S1S2 RRR, no m/r/g, equal pulses bilaterally. ABDOMEN - Soft, nontender, nondistended with normoactive BS Ext: warm, well perfused, intact peripheral pulses, no edema  NEURO:  Mental Status: Awake, alert, oriented to self. Not oriented to date or time or place. Language: speech is dysarthric  not following all commands consistently, follow simple commands and perseverates. Cranial Nerves: PERRL 65mm/brisk. EOMI, visual fields full, possibly mild left nasolabial fold flattening, facial sensation intact, hearing decreased bilaterally, tongue/uvula/soft palate midline, normal  sternocleidomastoid and trapezius muscle strength. No evidence of tongue atrophy or fibrillations Motor: 5/5 in both upper extremities. Nearly 5/5 strength in both lower extremities on repeat exam, initially he could not raise his legs antigravity. Tone: Is slightly increased in all 4 extremities Sensation- Intact to light touch bilaterally Coordination: Unable to perform formal testing Gait- deferred  NIHSS 1a Level of Conscious.: 0 1b LOC Questions: 1 1c LOC Commands: 0 2 Best Gaze: 0 3 Visual: 0 4 Facial Palsy: 1 5a Motor Arm - left: 0 5b Motor Arm - Right: 0 6a Motor Leg - Left: 2 (initial exam) 6b Motor Leg - Right: 2 (initial exam) 7 Limb Ataxia: 0 8 Sensory: 0 9 Best Language: 1 10 Dysarthria: 1  11 Extinct. and Inatten.: 0 TOTAL: 8   Labs I have reviewed labs in epic and the results pertinent to this consultation are: No new labs to review from today. There are pending.  Imaging I have reviewed the images obtained:  CT-scan of the brain -no evidence of acute changes.  Assessment:  This is a 83 year old man who has a past medical history as documented above, was brought in as an acute code stroke for altered mental status. Next line per report by EMS, he is being evaluated for a UTI, results of his urinalysis at the facility pending. Unclear if there was any history of preceding fevers. There is been no witnessed seizure activity He was brought in because he usually wakes up at 6 AM and this morning he did not wake up and upon the staff checking up on him, he was not arousable. He woke up in the emergency room, and followed most simple commands. He was able to tell us his name and age.He was able to move all 4 extremities without any focal deficits. I suspect that he has some sort of toxic metabolic encephalopathy or hypertensive encephalopathy. I would recommend testing for UTI or pneumonia or any other infectious process. He does have a history of atrial  fibrillation, and is not on anticoagulation, so an ischemic stroke is always a possibility that should be considered.  Impression: Toxic metabolic encephalopathy Hypertensive urgency Stroke is less likely, but possible since he has history of fibrillation and is not on anticoagulation History of seizure-could be a breakthrough seizure History of Parkinson's History of cognitive impairment  Recommendations: -At this time, I would recommend checking his labs to include CBC, CMP, urinalysis and chest x-ray. -If he has a UTI or pneumonia, that needs to be treated. At that point, he probably would not require any further neurological workup. -If all his labs remain unrevealing, I would then recommend obtaining an MRI of the brain to evaluate for an underlying cerebrovascular event. -As for anti-epileptics, I would not recommend any changes to his current home regimen of Lamictal 100 twice a day and Vimpat 50 mg in the morning and 150 mg at night. -He should follow with his outpatient neurologist in the coming 2 weeks after discharge.  -- Amie Portland, MD [facility] [phone]  If 7pm to 7am, please call on call as listed on AMION.

## 2017-05-18 NOTE — ED Provider Notes (Signed)
[facility] DEPT Provider Note   CSN: [id number] Arrival date & time: 05/18/17  2878   An emergency department physician performed an initial assessment on this suspected stroke patient at 0824.  History   Chief Complaint Chief Complaint  Patient presents with  . Code Stroke    HPI Barry Dean is a 83 y.o. male.  HPI  83 year old man from nursing home initially called out as code stroke. She reported that he normally gets up at 6 AM it was not awake at 8 AM. They had difficulty awakening him. Patient seen prior to my evaluation by neurologist at bridge prior to going to CT. He reports that the patient was arousable at that point but has improved now. 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 He is on Vimpat and Lamictal for this. He transfers with a walker and is usually in a wheelchair. He has a history of gait disturbance and falls. On my evaluation he is awake. He appears to be very hard of hearing. He will attempt to do as instructed. He appears to move all of his extremities here. He is not verbalizing any active complaints and is is difficult to elicit due to his being hard of hearing and likely some underlying dementia.  Past Medical History:  Diagnosis Date  . Abnormality of gait   . Anxiety   . Arthritis   . Atrial fibrillation ([place])   . BPH (benign prostatic hyperplasia)   . Deafness   . Dehydration   . Depression   . GERD (gastroesophageal reflux disease)   . Hypertension   . Melanoma ([place])   . Mitral regurgitation   . Reflux   . Seizure ([place])   . Seizures ([place])   . Syncope   . Tremor 09/27/2015  . Tremor, essential     Patient Active Problem List   Diagnosis Date Noted  . Pressure injury of skin 12/22/2016  . Aspiration pneumonia of both lower lobes due to gastric secretions ([place]) 12/20/2016  . Symptomatic anemia 12/19/2016  . Fall 12/19/2016  . Elevated troponin  12/19/2016  . Acute on chronic diastolic heart failure ([place]) 03/22/2016  . Sepsis, unspecified organism ([place])   . Hypokalemia   . Hypomagnesemia   . Sepsis due to pneumonia ([place])   . Sepsis ([place]) 03/12/2016  . Nausea & vomiting 03/12/2016  . Diarrhea 03/12/2016  . Acute encephalopathy 03/12/2016  . Acute kidney injury ([place]) 03/12/2016  . Acute respiratory failure with hypoxia ([place]) 03/12/2016  . Septic shock ([place])   . Abnormality of gait 09/27/2015  . Tremor 09/27/2015  . Undiagnosed cardiac murmurs 03/06/2015  . Syncope and collapse 03/05/2015  . Parkinson's disease ([place]) 03/05/2015  . PAF (paroxysmal atrial fibrillation) ([place]) 03/05/2015  . Bradycardia 03/05/2015  . Deafness 03/05/2015  . Mild mitral regurgitation 03/05/2015  . Emesis, persistent 09/14/2014  . Benign essential HTN 09/14/2014  . Syncope 09/13/2013  . ?? Respiratory failure 09/13/2013  . Noninfectious gastroenteritis and colitis 11/12/2012  . Acute and chronic respiratory failure 11/12/2012  . Community acquired pneumonia 11/12/2012  . HCAP (healthcare-associated pneumonia) 07/18/2011  . Seizure disorder ([place]) 07/18/2011  . HTN (hypertension) 07/18/2011  . Hyponatremia 07/18/2011    Past Surgical History:  Procedure Laterality Date  . APPENDECTOMY    . CATARACT EXTRACTION W/ INTRAOCULAR LENS  IMPLANT, BILATERAL    . ESOPHAGOGASTRODUODENOSCOPY (EGD) WITH PROPOFOL N/A 09/15/2014   Procedure: ESOPHAGOGASTRODUODENOSCOPY (EGD) WITH  PROPOFOL;  Surgeon: [name], MD;  Location: [facility] ENDOSCOPY;  Service: Endoscopy;  Laterality: N/A;  . unknown         Home Medications    Prior to Admission medications   Medication Sig Start Date End Date Taking? Authorizing Provider  amLODipine (NORVASC) 5 MG tablet Take 1 tablet (5 mg total) by mouth daily. 12/23/16  Yes TatShanon Brow, MD  aspirin 81 MG chewable tablet Chew 1 tablet (81 mg total) by mouth every evening. 09/22/14  Yes Thurnell Lose, MD  ferrous sulfate  325 (65 FE) MG tablet Take 1 tablet (325 mg total) by mouth 2 (two) times daily with a meal. 12/22/16  Yes Tat, David, MD  ipratropium-albuterol (DUONEB) 0.5-2.5 (3) MG/3ML SOLN Take 3 mLs by nebulization every 6 (six) hours as needed. Patient taking differently: Take 3 mLs by nebulization every 6 (six) hours as needed (for shortness of breath).  03/22/16  Yes Eugenie Filler, MD  lacosamide (VIMPAT) 50 MG TABS tablet Take 1 tablet in the AM (50 mg) and 3 tablets (150 mg ) in the PM. Patient taking differently: Take 50-150 mg by mouth See admin instructions. Take 50 mg by mouth in the morning and take 150 mg by mouth at bedtime 01/31/16  Yes Ward Givens, NP  lamoTRIgine (LAMICTAL) 100 MG tablet Take 1 tablet (100 mg total) by mouth 2 (two) times daily. 12/13/16  Yes Kathrynn Ducking, MD  mineral oil external liquid Place 3 drops into both ears every Friday.    Yes [provider]  OXYGEN Inhale 2 L into the lungs See admin instructions. Use 2L continuous at bedtime. Use 2L as needed for shortness of breath.   Yes [provider]  pantoprazole (PROTONIX) 20 MG tablet Take 20 mg by mouth daily.   Yes [provider]  polyethylene glycol (MIRALAX) packet Take 17 g by mouth daily. 12/22/16  Yes Tat, Shanon Brow, MD  sennosides-docusate sodium (SENOKOT-S) 8.6-50 MG tablet Take 1 tablet by mouth at bedtime as needed for constipation.    Yes [provider]  vitamin B-12 (CYANOCOBALAMIN) 1000 MCG tablet Take 2,000 mcg by mouth daily.   Yes [provider]  feeding supplement, ENSURE ENLIVE, (ENSURE ENLIVE) LIQD Take 237 mLs by mouth daily. Patient not taking: Reported on 05/18/2017 03/22/16   Eugenie Filler, MD    Family History Family History  Problem Relation Age of Onset  . Cancer Mother        Skin cancer  . Stroke Father   . Parkinsonism Sister   . Diabetes Brother     Social History Social History  Substance Use Topics  . Smoking status: Never  Smoker  . Smokeless tobacco: Never Used  . Alcohol use No     Allergies   Topamax; Uroxatral [alfuzosin hydrochloride]; and Valium   Review of Systems Review of Systems  Unable to perform ROS: Dementia     Physical Exam Updated Vital Signs BP (!) 201/95   Pulse 86   Resp (!) 25   Ht 1.778 m (5\' 10" )   Wt 84.4 kg (186 lb 1.1 oz)   SpO2 94%   BMI 26.70 kg/m   Physical Exam  Constitutional: He appears well-developed and well-nourished. No distress.  HENT:  Head: Normocephalic and atraumatic.  Right Ear: External ear normal.  Left Ear: External ear normal.  Mucous membranes appear somewhat dry  Eyes: Pupils are equal, round, and reactive to light.  Neck: Normal range of  motion.  Cardiovascular: Normal rate and regular rhythm.   Pulmonary/Chest: Effort normal and breath sounds normal.  Abdominal: Soft. Bowel sounds are normal.  Musculoskeletal: Normal range of motion.  Neurological: He is alert. No cranial nerve deficit. Coordination normal.  Patient is able to state his name and date of birth  Skin: Skin is warm. Capillary refill takes less than 2 seconds.  Nursing note and vitals reviewed.    ED Treatments / Results  Labs (all labs ordered are listed, but only abnormal results are displayed) Labs Reviewed  CBG MONITORING, ED - Abnormal; Notable for the following:       Result Value   Glucose-Capillary 100 (*)    All other components within normal limits  I-STAT CHEM 8, ED - Abnormal; Notable for the following:    Sodium 133 (*)    Chloride 95 (*)    Glucose, Bld 106 (*)    Calcium, Ion 1.05 (*)    All other components within normal limits  CBC  DIFFERENTIAL  PROTIME-INR  APTT  COMPREHENSIVE METABOLIC PANEL  I-STAT TROPONIN, ED    EKG  EKG Interpretation  Date/Time:  Saturday May 18 2017 08:56:00 EDT Ventricular Rate:  89 PR Interval:    QRS Duration: 92 QT Interval:  367 QTC Calculation: 447 R Axis:   170 Text Interpretation:  Sinus  rhythm Right axis deviation Confirmed by Pattricia Boss [phone]) on 05/18/2017 9:15:39 AM       Radiology Ct Head Code Stroke Wo Contrast  Result Date: 05/18/2017 CLINICAL DATA:  Code stroke. 83 year old male with new confusion and aphasia. Parkinson. EXAM: CT HEAD WITHOUT CONTRAST TECHNIQUE: Contiguous axial images were obtained from the base of the skull through the vertex without intravenous contrast. COMPARISON:  Head CT without contrast 12/19/2016 and earlier. FINDINGS: Brain: Stable cerebral volume. No acute intracranial hemorrhage identified. No midline shift, mass effect, or evidence of intracranial mass lesion. Stable ventricle size and configuration. Patchy and confluent bilateral white matter hypodensity appears stable. No cortically based acute infarct identified. Vascular: Intracranial artery dolichoectasia. Mild Calcified atherosclerosis at the skull base. No suspicious intracranial vascular hyperdensity. Skull: No acute osseous abnormality identified. Sinuses/Orbits: Visualized paranasal sinuses and mastoids are stable and well pneumatized. Other: No acute orbit, scalp, or deep soft tissue face finding identified. ASPECTS ([place] Stroke Program Early CT Score) - Ganglionic level infarction (caudate, lentiform nuclei, internal capsule, insula, M1-M3 cortex): 7 - Supraganglionic infarction (M4-M6 cortex): 3 Total score (0-10 with 10 being normal): 10 IMPRESSION: 1. Stable non contrast CT appearance of the brain since April 2018. 2. ASPECTS is 10. 3. The above was relayed via text pager to Dr. Jerelyn Charles On 05/18/2017 at 08:49 . Electronically Signed   By: Genevie Ann M.D.   On: 05/18/2017 08:50    Procedures Procedures (including critical care time)  Medications Ordered in ED Medications - No data to display   Initial Impression / Assessment and Plan / ED Course  I have reviewed the triage vital signs and the nursing notes.  Pertinent labs & imaging results that were available during my care  of the patient were reviewed by me and considered in my medical decision making (see chart for details).   patient seen with neurologist. No focalized deficits are noted. Plan to continue with metabolic evaluation and will reassess status after labs have returned.  He is hypertensive here and appears to not received his morning blood pressure medications.  This is a 83 year old man with dementia who was less  responsive for some period of time this morning. On evaluation here in the ED he is an awake and alert and appears to be his baseline. His daughter is present towards the end of his workup and states he has had some episodes of increased confusion over the past 2 days. I find no metabolic abnormality, or stroke to account for this. I discussed with his daughter that he return to the nursing home and she also feels this would be the best route for this patient he is a DO NOT RESUSCITATE. I find no evidence of acute infection, likely abnormality, cardiac infarction and will discharge the patient back to his facility. Final Clinical Impressions(s) / ED Diagnoses   Final diagnoses:  Transient alteration of awareness    New Prescriptions New Prescriptions   No medications on file     Pattricia Boss, MD 05/18/17 1329

## 2017-05-18 NOTE — Discharge Instructions (Signed)
No evidence of infection, metabolic abnormality, stroke, or cardiac ischemia.  Please return if worse at any time.  Recheck with his doctor asap.

## 2017-05-18 NOTE — Code Documentation (Signed)
83 y.o. Male resident of [facility] with PMHx of seizure on Lamictal and Vimpat, HTN and a-fibb not an anti-coagulation who was stated to have gone to bed normal last night at his usual time around 2000. It was stated the patient usually wakes up around 0600. This morning, the patient did not wake up at his usual time and when someone went to check on him, around 0800, he was found to have an altered mental status and to be non-verbal. EMS was notified and activated code stroke d/t being LVO positive.   On arrival to [facility] ED, the patient was met at the bridge by the stroke team, airway was cleared, patient taken to CT and labs drawn. LKW 05/17/17 at 2000. CT revealed no abnormalities. ASPECTS 10. NIHSS 10. VAN positive. See EMR for NIHSS, VAN and code stroke times. On assessment, patient had gradually started to return to baseline per EMS. AT [facility] ED, the patient was able to answer intermittent questions and follow intermittent commands. BLE weakness, mild aphasia, mild dysarthria and appears to be neglecting the left. IV tPA not given d/t being out of the window. ED bedside handoff with ED RN Vicente Males.

## 2017-05-18 NOTE — ED Notes (Signed)
[facility] contacted to transport patient back to [facility]

## 2017-05-21 DIAGNOSIS — E538 Deficiency of other specified B group vitamins: Secondary | ICD-10-CM | POA: Diagnosis not present

## 2017-05-21 DIAGNOSIS — E034 Atrophy of thyroid (acquired): Secondary | ICD-10-CM | POA: Diagnosis not present

## 2017-05-21 DIAGNOSIS — R4182 Altered mental status, unspecified: Secondary | ICD-10-CM | POA: Diagnosis not present

## 2017-05-21 DIAGNOSIS — G20 Parkinson's disease: Secondary | ICD-10-CM | POA: Diagnosis not present

## 2017-05-21 DIAGNOSIS — Z5181 Encounter for therapeutic drug level monitoring: Secondary | ICD-10-CM | POA: Diagnosis not present

## 2017-05-21 DIAGNOSIS — E559 Vitamin D deficiency, unspecified: Secondary | ICD-10-CM | POA: Diagnosis not present

## 2017-05-21 DIAGNOSIS — N183 Chronic kidney disease, stage 3 (moderate): Secondary | ICD-10-CM | POA: Diagnosis not present

## 2017-05-21 DIAGNOSIS — I10 Essential (primary) hypertension: Secondary | ICD-10-CM | POA: Diagnosis not present

## 2017-05-21 DIAGNOSIS — G4089 Other seizures: Secondary | ICD-10-CM | POA: Diagnosis not present

## 2017-05-21 DIAGNOSIS — D529 Folate deficiency anemia, unspecified: Secondary | ICD-10-CM | POA: Diagnosis not present

## 2017-05-21 DIAGNOSIS — R269 Unspecified abnormalities of gait and mobility: Secondary | ICD-10-CM | POA: Diagnosis not present

## 2017-05-22 ENCOUNTER — Emergency Department (HOSPITAL_COMMUNITY): Payer: Medicare Other

## 2017-05-22 ENCOUNTER — Encounter (HOSPITAL_COMMUNITY): Payer: Self-pay

## 2017-05-22 ENCOUNTER — Emergency Department (HOSPITAL_COMMUNITY)
Admission: EM | Admit: 2017-05-22 | Discharge: 2017-05-22 | Disposition: A | Discharge status: Home / Self Care | Payer: Medicare Other | Attending: Emergency Medicine | Admitting: Emergency Medicine

## 2017-05-22 DIAGNOSIS — W19XXXA Unspecified fall, initial encounter: Secondary | ICD-10-CM | POA: Diagnosis not present

## 2017-05-22 DIAGNOSIS — G20 Parkinson's disease: Secondary | ICD-10-CM | POA: Insufficient documentation

## 2017-05-22 DIAGNOSIS — Y939 Activity, unspecified: Secondary | ICD-10-CM | POA: Diagnosis not present

## 2017-05-22 DIAGNOSIS — Y999 Unspecified external cause status: Secondary | ICD-10-CM | POA: Insufficient documentation

## 2017-05-22 DIAGNOSIS — Z79899 Other long term (current) drug therapy: Secondary | ICD-10-CM | POA: Insufficient documentation

## 2017-05-22 DIAGNOSIS — I10 Essential (primary) hypertension: Secondary | ICD-10-CM | POA: Insufficient documentation

## 2017-05-22 DIAGNOSIS — Y929 Unspecified place or not applicable: Secondary | ICD-10-CM | POA: Insufficient documentation

## 2017-05-22 DIAGNOSIS — Z7982 Long term (current) use of aspirin: Secondary | ICD-10-CM | POA: Diagnosis not present

## 2017-05-22 DIAGNOSIS — S0101XA Laceration without foreign body of scalp, initial encounter: Secondary | ICD-10-CM | POA: Insufficient documentation

## 2017-05-22 DIAGNOSIS — F0390 Unspecified dementia without behavioral disturbance: Secondary | ICD-10-CM | POA: Diagnosis not present

## 2017-05-22 DIAGNOSIS — S0990XA Unspecified injury of head, initial encounter: Secondary | ICD-10-CM

## 2017-05-22 DIAGNOSIS — G8911 Acute pain due to trauma: Secondary | ICD-10-CM | POA: Diagnosis not present

## 2017-05-22 DIAGNOSIS — S098XXA Other specified injuries of head, initial encounter: Secondary | ICD-10-CM | POA: Diagnosis not present

## 2017-05-22 DIAGNOSIS — S064X0A Epidural hemorrhage without loss of consciousness, initial encounter: Secondary | ICD-10-CM | POA: Diagnosis not present

## 2017-05-22 LAB — CBC WITH DIFFERENTIAL/PLATELET
Basophils Absolute: 0 10*3/uL (ref 0.0–0.1)
Basophils Relative: 0 %
EOS ABS: 0.1 10*3/uL (ref 0.0–0.7)
EOS PCT: 1 %
HCT: 38.4 % — ABNORMAL LOW (ref 39.0–52.0)
Hemoglobin: 13.5 g/dL (ref 13.0–17.0)
LYMPHS ABS: 1.3 10*3/uL (ref 0.7–4.0)
Lymphocytes Relative: 24 %
MCH: 32.1 pg (ref 26.0–34.0)
MCHC: 35.2 g/dL (ref 30.0–36.0)
MCV: 91.2 fL (ref 78.0–100.0)
Monocytes Absolute: 0.7 10*3/uL (ref 0.1–1.0)
Monocytes Relative: 12 %
Neutro Abs: 3.4 10*3/uL (ref 1.7–7.7)
Neutrophils Relative %: 63 %
PLATELETS: 233 10*3/uL (ref 150–400)
RBC: 4.21 MIL/uL — AB (ref 4.22–5.81)
RDW: 14.1 % (ref 11.5–15.5)
WBC: 5.4 10*3/uL (ref 4.0–10.5)

## 2017-05-22 LAB — BASIC METABOLIC PANEL
Anion gap: 9 (ref 5–15)
BUN: 15 mg/dL (ref 6–20)
CO2: 27 mmol/L (ref 22–32)
CREATININE: 0.76 mg/dL (ref 0.61–1.24)
Calcium: 8.8 mg/dL — ABNORMAL LOW (ref 8.9–10.3)
Chloride: 100 mmol/L — ABNORMAL LOW (ref 101–111)
GFR calc Af Amer: >60 mL/min (ref >60)
Glucose, Bld: 91 mg/dL (ref 65–99)
Potassium: 3.9 mmol/L (ref 3.5–5.1)
SODIUM: 136 mmol/L (ref 135–145)

## 2017-05-22 NOTE — ED Notes (Signed)
[facility] made aware of transport needed back to SNF.

## 2017-05-22 NOTE — ED Provider Notes (Signed)
[facility] DEPT Provider Note   CSN: [id number] Arrival date & time: 05/22/17  0305     History   Chief Complaint Chief Complaint  Patient presents with  . Fall  . Head Injury    HPI Barry Dean is a 83 y.o. male.  Patient is a 83 year old male brought from his extended care facility for evaluation of fall. He apparently fell backward and hit the back of his head. There is no reported loss of consciousness, however he does seem to be behaving differently according to the staff. The patient has a history of dementia and adds no useful additional history.   The history is provided by the patient.  Fall  This is a new problem. The current episode started 1 to 2 hours ago. The problem occurs constantly. The problem has not changed since onset.Nothing aggravates the symptoms. Nothing relieves the symptoms.  Head Injury      Past Medical History:  Diagnosis Date  . Abnormality of gait   . Anxiety   . Arthritis   . Atrial fibrillation ([place])   . BPH (benign prostatic hyperplasia)   . Deafness   . Dehydration   . Depression   . GERD (gastroesophageal reflux disease)   . Hypertension   . Melanoma ([place])   . Mitral regurgitation   . Reflux   . Seizure ([place])   . Seizures ([place])   . Syncope   . Tremor 09/27/2015  . Tremor, essential     Patient Active Problem List   Diagnosis Date Noted  . Pressure injury of skin 12/22/2016  . Aspiration pneumonia of both lower lobes due to gastric secretions ([place]) 12/20/2016  . Symptomatic anemia 12/19/2016  . Fall 12/19/2016  . Elevated troponin 12/19/2016  . Acute on chronic diastolic heart failure ([place]) 03/22/2016  . Sepsis, unspecified organism ([place])   . Hypokalemia   . Hypomagnesemia   . Sepsis due to pneumonia ([place])   . Sepsis ([place]) 03/12/2016  . Nausea & vomiting 03/12/2016  . Diarrhea 03/12/2016  . Acute encephalopathy 03/12/2016  . Acute kidney injury ([place]) 03/12/2016  . Acute respiratory failure with hypoxia  ([place]) 03/12/2016  . Septic shock ([place])   . Abnormality of gait 09/27/2015  . Tremor 09/27/2015  . Undiagnosed cardiac murmurs 03/06/2015  . Syncope and collapse 03/05/2015  . Parkinson's disease ([place]) 03/05/2015  . PAF (paroxysmal atrial fibrillation) ([place]) 03/05/2015  . Bradycardia 03/05/2015  . Deafness 03/05/2015  . Mild mitral regurgitation 03/05/2015  . Emesis, persistent 09/14/2014  . Benign essential HTN 09/14/2014  . Syncope 09/13/2013  . ?? Respiratory failure 09/13/2013  . Noninfectious gastroenteritis and colitis 11/12/2012  . Acute and chronic respiratory failure 11/12/2012  . Community acquired pneumonia 11/12/2012  . HCAP (healthcare-associated pneumonia) 07/18/2011  . Seizure disorder ([place]) 07/18/2011  . HTN (hypertension) 07/18/2011  . Hyponatremia 07/18/2011    Past Surgical History:  Procedure Laterality Date  . APPENDECTOMY    . CATARACT EXTRACTION W/ INTRAOCULAR LENS  IMPLANT, BILATERAL    . ESOPHAGOGASTRODUODENOSCOPY (EGD) WITH PROPOFOL N/A 09/15/2014   Procedure: ESOPHAGOGASTRODUODENOSCOPY (EGD) WITH PROPOFOL;  Surgeon: [name], MD;  Location: [facility] ENDOSCOPY;  Service: Endoscopy;  Laterality: N/A;  . unknown         Home Medications    Prior to Admission medications   Medication Sig Start Date End Date Taking? Authorizing Provider  amLODipine (NORVASC) 5 MG tablet Take 1 tablet (5 mg total) by mouth daily. 12/23/16  Deveron Furlong, MD  aspirin 81 MG chewable tablet Chew 1 tablet (81 mg total) by mouth every evening. 09/22/14  Yes Thurnell Lose, MD  feeding supplement, ENSURE ENLIVE, (ENSURE ENLIVE) LIQD Take 237 mLs by mouth daily. 03/22/16  Yes Eugenie Filler, MD  ferrous sulfate 325 (65 FE) MG tablet Take 1 tablet (325 mg total) by mouth 2 (two) times daily with a meal. 12/22/16  Yes Tat, Shanon Brow, MD  lacosamide (VIMPAT) 50 MG TABS tablet Take 1 tablet in the AM (50 mg) and 3 tablets (150 mg ) in the PM. Patient taking differently: Take 50-150  mg by mouth See admin instructions. Take 50 mg by mouth in the morning and take 150 mg by mouth at bedtime 01/31/16  Yes Ward Givens, NP  lamoTRIgine (LAMICTAL) 100 MG tablet Take 1 tablet (100 mg total) by mouth 2 (two) times daily. 12/13/16  Yes Kathrynn Ducking, MD  mineral oil external liquid Place 3 drops into both ears every Friday.    Yes [provider]  pantoprazole (PROTONIX) 20 MG tablet Take 20 mg by mouth daily.   Yes [provider]  polyethylene glycol (MIRALAX) packet Take 17 g by mouth daily. 12/22/16  Yes Tat, Shanon Brow, MD  vitamin B-12 (CYANOCOBALAMIN) 1000 MCG tablet Take 2,000 mcg by mouth daily.   Yes [provider]  ipratropium-albuterol (DUONEB) 0.5-2.5 (3) MG/3ML SOLN Take 3 mLs by nebulization every 6 (six) hours as needed. Patient taking differently: Take 3 mLs by nebulization every 6 (six) hours as needed (for shortness of breath).  03/22/16   Eugenie Filler, MD  OXYGEN Inhale 2 L into the lungs See admin instructions. Use 2L continuous at bedtime. Use 2L as needed for shortness of breath.    [provider]  sennosides-docusate sodium (SENOKOT-S) 8.6-50 MG tablet Take 1 tablet by mouth at bedtime as needed for constipation.     [provider]    Family History Family History  Problem Relation Age of Onset  . Cancer Mother        Skin cancer  . Stroke Father   . Parkinsonism Sister   . Diabetes Brother     Social History Social History  Substance Use Topics  . Smoking status: Never Smoker  . Smokeless tobacco: Never Used  . Alcohol use No     Allergies   Topamax; Uroxatral [alfuzosin hydrochloride]; and Valium   Review of Systems Review of Systems  Unable to perform ROS: Dementia     Physical Exam Updated Vital Signs There were no vitals taken for this visit.  Physical Exam  Constitutional: He is oriented to person, place, and time. He appears well-developed and well-nourished. No distress.    HENT:  Head: Normocephalic and atraumatic.  Mouth/Throat: Oropharynx is clear and moist.  Eyes: Pupils are equal, round, and reactive to light. EOM are normal.  Neck: Normal range of motion. Neck supple.  Cardiovascular: Normal rate and regular rhythm.  Exam reveals no friction rub.   No murmur heard. Pulmonary/Chest: Effort normal and breath sounds normal. No respiratory distress. He has no wheezes. He has no rales.  Abdominal: Soft. Bowel sounds are normal. He exhibits no distension. There is no tenderness.  Musculoskeletal: Normal range of motion. He exhibits no edema.  Pelvis is stable and there is no pain with internal or external rotation of the hips.  Neurological: He is alert and oriented to person, place, and time. No cranial nerve deficit. He exhibits normal muscle tone. Coordination normal.  Neurologic exam is somewhat limited secondary to dementia, however there are no obvious focal findings. He moves all extremities  Skin: Skin is warm and dry. He is not diaphoretic.  Nursing note and vitals reviewed.    ED Treatments / Results  Labs (all labs ordered are listed, but only abnormal results are displayed) Labs Reviewed  BASIC METABOLIC PANEL - Abnormal; Notable for the following:       Result Value   Chloride 100 (*)    Calcium 8.8 (*)    All other components within normal limits  CBC WITH DIFFERENTIAL/PLATELET - Abnormal; Notable for the following:    RBC 4.21 (*)    HCT 38.4 (*)    All other components within normal limits    EKG  EKG Interpretation None       Radiology Ct Head Wo Contrast  Result Date: 05/22/2017 CLINICAL DATA:  Head trauma, ataxia; Polytrauma, critical, head/C-spine injury suspected. Post unwitnessed fall. Scalp hematoma. EXAM: CT HEAD WITHOUT CONTRAST CT CERVICAL SPINE WITHOUT CONTRAST TECHNIQUE: Multidetector CT imaging of the head and cervical spine was performed following the standard protocol without intravenous contrast. Multiplanar  CT image reconstructions of the cervical spine were also generated. COMPARISON:  Head CT 05/18/2017, head and cervical spine CT 12/19/2016 FINDINGS: CT HEAD FINDINGS Brain: Stable atrophy and chronic small vessel ischemia. No intracranial hemorrhage, mass effect, or midline shift. No hydrocephalus. The basilar cisterns are patent. No evidence of territorial infarct or acute ischemia. No extra-axial or intracranial fluid collection. Vascular: Atherosclerosis of skullbase vasculature with tortuous vascularity. No hyperdense vessel. Skull: No fracture or focal lesion. Sinuses/Orbits: Paranasal sinuses and mastoid air cells are clear. The visualized orbits are unremarkable. Bilateral cataract resection. Other: Minimal air in the left parietal scalp soft tissue is may be soft tissue injury. Minimal right parietal-occipital scalp hematoma. CT CERVICAL SPINE FINDINGS Alignment: No traumatic subluxation. Stable straightening of normal lordosis. Skull base and vertebrae: No acute fracture. Vertebral body heights are maintained. The dens and skull base are intact. None fusion posterior arch of C1, a normal variant. Soft tissues and spinal canal: No prevertebral fluid or swelling. No visible canal hematoma. Disc levels: Diffuse disc space narrowing and endplate spurring, most significant at C4-C5 and C5-C6. Multilevel facet arthropathy. Degenerative changes are stable from prior. Upper chest: Negative. Other: None. IMPRESSION: 1. No acute intracranial abnormality. No skull fracture. Minimal air in the posterior scalp soft tissues suggesting scalp injury/laceration. 2. Stable degenerative change in the cervical spine without acute fracture or subluxation. Electronically Signed   By: Jeb Levering M.D.   On: 05/22/2017 04:42   Ct Cervical Spine Wo Contrast  Result Date: 05/22/2017 CLINICAL DATA:  Head trauma, ataxia; Polytrauma, critical, head/C-spine injury suspected. Post unwitnessed fall. Scalp hematoma. EXAM: CT HEAD  WITHOUT CONTRAST CT CERVICAL SPINE WITHOUT CONTRAST TECHNIQUE: Multidetector CT imaging of the head and cervical spine was performed following the standard protocol without intravenous contrast. Multiplanar CT image reconstructions of the cervical spine were also generated. COMPARISON:  Head CT 05/18/2017, head and cervical spine CT 12/19/2016 FINDINGS: CT HEAD FINDINGS Brain: Stable atrophy and chronic small vessel ischemia. No intracranial hemorrhage, mass effect, or midline shift. No hydrocephalus. The basilar cisterns are patent. No evidence of territorial infarct or acute ischemia. No extra-axial or intracranial fluid collection. Vascular: Atherosclerosis of skullbase vasculature with tortuous vascularity. No hyperdense vessel. Skull: No fracture or focal lesion. Sinuses/Orbits: Paranasal sinuses and mastoid air cells are clear. The visualized orbits are unremarkable. Bilateral cataract resection. Other:  Minimal air in the left parietal scalp soft tissue is may be soft tissue injury. Minimal right parietal-occipital scalp hematoma. CT CERVICAL SPINE FINDINGS Alignment: No traumatic subluxation. Stable straightening of normal lordosis. Skull base and vertebrae: No acute fracture. Vertebral body heights are maintained. The dens and skull base are intact. None fusion posterior arch of C1, a normal variant. Soft tissues and spinal canal: No prevertebral fluid or swelling. No visible canal hematoma. Disc levels: Diffuse disc space narrowing and endplate spurring, most significant at C4-C5 and C5-C6. Multilevel facet arthropathy. Degenerative changes are stable from prior. Upper chest: Negative. Other: None. IMPRESSION: 1. No acute intracranial abnormality. No skull fracture. Minimal air in the posterior scalp soft tissues suggesting scalp injury/laceration. 2. Stable degenerative change in the cervical spine without acute fracture or subluxation. Electronically Signed   By: Jeb Levering M.D.   On: 05/22/2017  04:42    Procedures Procedures (including critical care time)  Medications Ordered in ED Medications - No data to display   Initial Impression / Assessment and Plan / ED Course  I have reviewed the triage vital signs and the nursing notes.  Pertinent labs & imaging results that were available during my care of the patient were reviewed by me and considered in my medical decision making (see chart for details).  Patient brought for evaluation after a fall at the nursing home. He has a small laceration to the occipital area that does not require suturing. CT scan of the head and cervical spine are negative. CBC and basic metabolic panel are unremarkable. I see no reason for admission or further workup. He will be discharged, to return as needed for any problems.  Final Clinical Impressions(s) / ED Diagnoses   Final diagnoses:  None    New Prescriptions New Prescriptions   No medications on file     Veryl Speak, MD 05/22/17 [phone]

## 2017-05-22 NOTE — ED Notes (Signed)
Pt had a fall last week and the staff state that he seems a little more confused than normal

## 2017-05-22 NOTE — ED Notes (Signed)
[place] to give report, was placed in voicemail.

## 2017-05-22 NOTE — Discharge Instructions (Signed)
Return to the emergency department for headache, changes in level of consciousness, or other new and concerning symptoms.

## 2017-05-22 NOTE — ED Notes (Signed)
Pt had an unwitnessed fall, pt has an abrasion and hematoma to the back of his head

## 2017-05-27 DIAGNOSIS — R1312 Dysphagia, oropharyngeal phase: Secondary | ICD-10-CM | POA: Diagnosis not present

## 2017-05-28 DIAGNOSIS — G4089 Other seizures: Secondary | ICD-10-CM | POA: Diagnosis not present

## 2017-05-28 DIAGNOSIS — I10 Essential (primary) hypertension: Secondary | ICD-10-CM | POA: Diagnosis not present

## 2017-05-28 DIAGNOSIS — Z993 Dependence on wheelchair: Secondary | ICD-10-CM | POA: Diagnosis not present

## 2017-05-28 DIAGNOSIS — F39 Unspecified mood [affective] disorder: Secondary | ICD-10-CM | POA: Diagnosis not present

## 2017-05-28 DIAGNOSIS — R269 Unspecified abnormalities of gait and mobility: Secondary | ICD-10-CM | POA: Diagnosis not present

## 2017-05-28 DIAGNOSIS — R1310 Dysphagia, unspecified: Secondary | ICD-10-CM | POA: Diagnosis not present

## 2017-05-28 DIAGNOSIS — G20 Parkinson's disease: Secondary | ICD-10-CM | POA: Diagnosis not present

## 2017-06-06 DIAGNOSIS — R1312 Dysphagia, oropharyngeal phase: Secondary | ICD-10-CM | POA: Diagnosis not present

## 2017-06-11 DIAGNOSIS — H903 Sensorineural hearing loss, bilateral: Secondary | ICD-10-CM | POA: Diagnosis not present

## 2017-06-11 DIAGNOSIS — H6122 Impacted cerumen, left ear: Secondary | ICD-10-CM | POA: Diagnosis not present

## 2017-06-14 ENCOUNTER — Other Ambulatory Visit: Payer: Self-pay | Admitting: Neurology

## 2017-06-14 DIAGNOSIS — R1312 Dysphagia, oropharyngeal phase: Secondary | ICD-10-CM | POA: Diagnosis not present

## 2017-06-14 MED ORDER — LACOSAMIDE 50 MG PO TABS
ORAL_TABLET | ORAL | 4 refills | Status: DC
Start: 2017-06-14 — End: 2017-10-25

## 2017-06-17 ENCOUNTER — Telehealth: Payer: Self-pay | Admitting: *Deleted

## 2017-06-17 NOTE — Telephone Encounter (Signed)
Faxed back signed order to [name] at [phone] re: [facility] and medication changed related to Vimpat per CW,MD. Per CW,MD "reduce vimpat to 100mg  qhs, 50mg  qam". Received fax confirmation. Pt had a fall on 05/19/17.

## 2017-06-18 ENCOUNTER — Ambulatory Visit: Payer: Medicare Other | Admitting: Adult Health

## 2017-06-20 DIAGNOSIS — R1312 Dysphagia, oropharyngeal phase: Secondary | ICD-10-CM | POA: Diagnosis not present

## 2017-06-25 DIAGNOSIS — R1312 Dysphagia, oropharyngeal phase: Secondary | ICD-10-CM | POA: Diagnosis not present

## 2017-06-27 DIAGNOSIS — R1312 Dysphagia, oropharyngeal phase: Secondary | ICD-10-CM | POA: Diagnosis not present

## 2017-07-05 DIAGNOSIS — R1312 Dysphagia, oropharyngeal phase: Secondary | ICD-10-CM | POA: Diagnosis not present

## 2017-07-10 DIAGNOSIS — R1312 Dysphagia, oropharyngeal phase: Secondary | ICD-10-CM | POA: Diagnosis not present

## 2017-07-17 DIAGNOSIS — R1312 Dysphagia, oropharyngeal phase: Secondary | ICD-10-CM | POA: Diagnosis not present

## 2017-07-24 DIAGNOSIS — R1312 Dysphagia, oropharyngeal phase: Secondary | ICD-10-CM | POA: Diagnosis not present

## 2017-08-22 DIAGNOSIS — L89520 Pressure ulcer of left ankle, unstageable: Secondary | ICD-10-CM | POA: Diagnosis not present

## 2017-08-22 DIAGNOSIS — G20 Parkinson's disease: Secondary | ICD-10-CM | POA: Diagnosis not present

## 2017-10-01 ENCOUNTER — Encounter: Payer: Self-pay | Admitting: Adult Health

## 2017-10-01 ENCOUNTER — Ambulatory Visit (INDEPENDENT_AMBULATORY_CARE_PROVIDER_SITE_OTHER): Payer: Medicare Other | Admitting: Adult Health

## 2017-10-01 VITALS — BP 108/58 | HR 67 | Ht 70.0 in

## 2017-10-01 DIAGNOSIS — Z5181 Encounter for therapeutic drug level monitoring: Secondary | ICD-10-CM | POA: Diagnosis not present

## 2017-10-01 DIAGNOSIS — R569 Unspecified convulsions: Secondary | ICD-10-CM

## 2017-10-01 NOTE — Patient Instructions (Addendum)
Your Plan:  Continue Vimpat and Lamictal Blood work today  If your symptoms worsen or you develop new symptoms please let us know.   Thank you for coming to see [place] at [facility]. I hope we have been able to provide you high quality care today.  You may receive a patient satisfaction survey over the next few weeks. We would appreciate your feedback and comments so that we may continue to improve ourselves and the health of our patients.

## 2017-10-01 NOTE — Progress Notes (Signed)
I have read the note, and I agree with the clinical assessment and plan.  Barry Dean   

## 2017-10-01 NOTE — Progress Notes (Signed)
PATIENT: Barry Dean DOB: Oct 27, 1923  REASON FOR VISIT: follow up HISTORY FROM: patient  HISTORY OF PRESENT ILLNESS: Today 10/01/17  Barry Dean is a 84 year old male with a history of seizures.  He returns today for follow-up.  According to his daughter he has not had any additional seizure events that she is aware of.  He has had several falls over the last several months.  Fortunately he has not suffered any significant injury.  His daughter reports that he typically falls because he has to go to the bathroom and does not wait for staff to come and take him.  Patient primarily uses a wheelchair for ambulation.  He returns today for an evaluation.  HISTORY Barry Dean is a 84 year old left-handed white male with a history of gelastic seizures. The patient continues to have some seizure-type events once or twice a week, the episodes are very brief and do not result in any injury to the patient. The patient will laugh briefly for a few seconds, then the event will be over. The patient is on Vimpat and Lamictal. He seems to be tolerating his medications well. The patient does have tremors of both arms, both resting and intentional, and he has problems with drooling. The patient does have a gait disorder. He will be getting physical and occupational therapy in the near future. He is on a mechanically soft diet, he denies any significant issues with swallowing. He did fall about 2 weeks ago without injury. He has a tendency to lean backwards, he requires assistance with walking  REVIEW OF SYSTEMS: Out of a complete 14 system review of symptoms, the patient complains only of the following symptoms, and all other reviewed systems are negative.  See HPI  ALLERGIES: Allergies  Allergen Reactions  . Topamax Other (See Comments)    Reaction:  Unknown   . Uroxatral [Alfuzosin Hydrochloride] Other (See Comments)    Reaction:  Unknown   . Valium Other (See Comments)    Reaction:  Unknown      HOME MEDICATIONS: Outpatient Medications Prior to Visit  Medication Sig Dispense Refill  . amLODipine (NORVASC) 5 MG tablet Take 1 tablet (5 mg total) by mouth daily. 30 tablet 0  . aspirin 81 MG chewable tablet Chew 1 tablet (81 mg total) by mouth every evening.    . feeding supplement, ENSURE ENLIVE, (ENSURE ENLIVE) LIQD Take 237 mLs by mouth daily. 237 mL 0  . ferrous sulfate 325 (65 FE) MG tablet Take 1 tablet (325 mg total) by mouth 2 (two) times daily with a meal. 60 tablet 0  . ipratropium-albuterol (DUONEB) 0.5-2.5 (3) MG/3ML SOLN Take 3 mLs by nebulization every 6 (six) hours as needed. (Patient taking differently: Take 3 mLs by nebulization every 6 (six) hours as needed (for shortness of breath). ) 360 mL 0  . lacosamide (VIMPAT) 50 MG TABS tablet Take 1 tablet in the AM (50 mg) and 2 tablets in the evening 120 tablet 4  . lamoTRIgine (LAMICTAL) 100 MG tablet Take 1 tablet (100 mg total) by mouth 2 (two) times daily. 60 tablet 5  . mineral oil external liquid Place 3 drops into both ears every Friday.     . OXYGEN Inhale 2 L into the lungs See admin instructions. Use 2L continuous at bedtime. Use 2L as needed for shortness of breath.    . pantoprazole (PROTONIX) 20 MG tablet Take 20 mg by mouth daily.    . polyethylene glycol (MIRALAX) packet Take 17  g by mouth daily. 28 each 0  . sennosides-docusate sodium (SENOKOT-S) 8.6-50 MG tablet Take 1 tablet by mouth at bedtime as needed for constipation.     . vitamin B-12 (CYANOCOBALAMIN) 1000 MCG tablet Take 2,000 mcg by mouth daily.     No facility-administered medications prior to visit.     PAST MEDICAL HISTORY: Past Medical History:  Diagnosis Date  . Abnormality of gait   . Anxiety   . Arthritis   . Atrial fibrillation ([place])   . BPH (benign prostatic hyperplasia)   . Deafness   . Dehydration   . Depression   . GERD (gastroesophageal reflux disease)   . Hypertension   . Melanoma ([place])   . Mitral regurgitation   .  Reflux   . Seizure ([place])   . Seizures ([place])   . Syncope   . Tremor 09/27/2015  . Tremor, essential     PAST SURGICAL HISTORY: Past Surgical History:  Procedure Laterality Date  . APPENDECTOMY    . CATARACT EXTRACTION W/ INTRAOCULAR LENS  IMPLANT, BILATERAL    . ESOPHAGOGASTRODUODENOSCOPY (EGD) WITH PROPOFOL N/A 09/15/2014   Procedure: ESOPHAGOGASTRODUODENOSCOPY (EGD) WITH PROPOFOL;  Surgeon: [name], MD;  Location: [facility] ENDOSCOPY;  Service: Endoscopy;  Laterality: N/A;  . unknown      FAMILY HISTORY: Family History  Problem Relation Age of Onset  . Cancer Mother        Skin cancer  . Stroke Father   . Parkinsonism Sister   . Diabetes Brother     SOCIAL HISTORY: Social History   Socioeconomic History  . Marital status: Unknown    Spouse name: Not on file  . Number of children: Not on file  . Years of education: Not on file  . Highest education level: Not on file  Social Needs  . Financial resource strain: Not on file  . Food insecurity - worry: Not on file  . Food insecurity - inability: Not on file  . Transportation needs - medical: Not on file  . Transportation needs - non-medical: Not on file  Occupational History  . Not on file  Tobacco Use  . Smoking status: Never Smoker  . Smokeless tobacco: Never Used  Substance and Sexual Activity  . Alcohol use: No  . Drug use: No  . Sexual activity: Not on file  Other Topics Concern  . Not on file  Social History Narrative   ** Merged History Encounter **      Patient drinks about 2 cups of caffeine daily.   Patient is left handed.          PHYSICAL EXAM  Vitals:   10/01/17 0920  BP: (!) 108/58  Pulse: 67  Height: 5\' 10"  (1.778 m)   Body mass index is 26.7 kg/m.  Generalized: Well developed, in no acute distress   Neurological examination  Mentation: Alert oriented to time, place, history taking. Follows all commands speech and language fluent Cranial nerve II-XII: Pupils were equal round  reactive to light. Extraocular movements were full, visual field were full on confrontational test. Facial sensation and strength were normal. Uvula tongue midline. Head turning and shoulder shrug  were normal and symmetric. Motor: The motor testing reveals 5 over 5 strength of all 4 extremities. Good symmetric motor tone is noted throughout.  Sensory: Sensory testing is intact to soft touch on all 4 extremities. No evidence of extinction is noted.  Coordination: Cerebellar testing reveals good finger-nose-finger and heel-to-shin bilaterally.  Gait and station:  Gait is normal.  Reflexes: Deep tendon reflexes are symmetric and normal bilaterally.   DIAGNOSTIC DATA (LABS, IMAGING, TESTING) - I reviewed patient records, labs, notes, testing and imaging myself where available.  Lab Results  Component Value Date   WBC 5.4 05/22/2017   HGB 13.5 05/22/2017   HCT 38.4 (L) 05/22/2017   MCV 91.2 05/22/2017   PLT 233 05/22/2017      Component Value Date/Time   NA 136 05/22/2017 0503   NA 136 06/12/2016 1450   K 3.9 05/22/2017 0503   CL 100 (L) 05/22/2017 0503   CO2 27 05/22/2017 0503   GLUCOSE 91 05/22/2017 0503   BUN 15 05/22/2017 0503   BUN 14 06/12/2016 1450   CREATININE 0.76 05/22/2017 0503   CALCIUM 8.8 (L) 05/22/2017 0503   PROT 7.7 05/18/2017 0856   PROT 6.6 06/12/2016 1450   ALBUMIN 4.4 05/18/2017 0856   ALBUMIN 4.1 06/12/2016 1450   AST 28 05/18/2017 0856   ALT 19 05/18/2017 0856   ALKPHOS 78 05/18/2017 0856   BILITOT 0.7 05/18/2017 0856   BILITOT <0.2 06/12/2016 1450   GFRNONAA >60 05/22/2017 0503   GFRAA >60 05/22/2017 0503   Lab Results  Component Value Date   CHOL 90 12/20/2016   HDL 34 (L) 12/20/2016   LDLCALC 43 12/20/2016   TRIG 64 12/20/2016   CHOLHDL 2.6 12/20/2016   Lab Results  Component Value Date   HGBA1C 5.8 (H) 12/20/2016   Lab Results  Component Value Date   VITAMINB12 497 12/20/2016   Lab Results  Component Value Date   TSH 2.623 03/15/2016       ASSESSMENT AND PLAN 84 y.o. year old male  has a past medical history of Abnormality of gait, Anxiety, Arthritis, Atrial fibrillation ([place]), BPH (benign prostatic hyperplasia), Deafness, Dehydration, Depression, GERD (gastroesophageal reflux disease), Hypertension, Melanoma ([place]), Mitral regurgitation, Reflux, Seizure ([place]), Seizures ([place]), Syncope, Tremor (09/27/2015), and Tremor, essential. here with:  1.  Seizures   Overall the patient is doing well.  He will continue on Vimpat and Lamictal.  I will check blood work today.  The patient and his daughter is advised that if his symptoms worsen or he develops new symptoms they should let us know.  He will follow-up in 6 months or sooner if needed.  I spent 15 minutes with the patient. 50% of this time was spent reviewing medication     Ward Givens, MSN, NP-C 10/01/2017, 9:02 AM [facility] [address] [place],  [address] [phone]

## 2017-10-02 LAB — COMPREHENSIVE METABOLIC PANEL
ALBUMIN: 3.9 g/dL (ref 3.2–4.6)
ALT: 13 IU/L (ref 0–44)
AST: 23 IU/L (ref 0–40)
Albumin/Globulin Ratio: 1.6 (ref 1.2–2.2)
Alkaline Phosphatase: 93 IU/L (ref 39–117)
BUN / CREAT RATIO: 14 (ref 10–24)
BUN: 13 mg/dL (ref 10–36)
Bilirubin Total: 0.2 mg/dL (ref 0.0–1.2)
CALCIUM: 9.2 mg/dL (ref 8.6–10.2)
CO2: 22 mmol/L (ref 20–29)
CREATININE: 0.9 mg/dL (ref 0.76–1.27)
Chloride: 100 mmol/L (ref 96–106)
GFR calc Af Amer: 86 mL/min/{1.73_m2} (ref >59)
GFR, EST NON AFRICAN AMERICAN: 74 mL/min/{1.73_m2} (ref >59)
GLOBULIN, TOTAL: 2.5 g/dL (ref 1.5–4.5)
Glucose: 95 mg/dL (ref 65–99)
Potassium: 4.7 mmol/L (ref 3.5–5.2)
SODIUM: 139 mmol/L (ref 134–144)
Total Protein: 6.4 g/dL (ref 6.0–8.5)

## 2017-10-02 LAB — CBC WITH DIFFERENTIAL/PLATELET
Basophils Absolute: 0 10*3/uL (ref 0.0–0.2)
Basos: 0 %
EOS (ABSOLUTE): 0.1 10*3/uL (ref 0.0–0.4)
EOS: 2 %
HEMOGLOBIN: 12.9 g/dL — AB (ref 13.0–17.7)
Hematocrit: 40.1 % (ref 37.5–51.0)
IMMATURE GRANULOCYTES: 0 %
Immature Grans (Abs): 0 10*3/uL (ref 0.0–0.1)
LYMPHS ABS: 1.6 10*3/uL (ref 0.7–3.1)
Lymphs: 23 %
MCH: 31.4 pg (ref 26.6–33.0)
MCHC: 32.2 g/dL (ref 31.5–35.7)
MCV: 98 fL — AB (ref 79–97)
MONOS ABS: 0.7 10*3/uL (ref 0.1–0.9)
Monocytes: 10 %
NEUTROS PCT: 65 %
Neutrophils Absolute: 4.3 10*3/uL (ref 1.4–7.0)
Platelets: 275 10*3/uL (ref 150–379)
RBC: 4.11 x10E6/uL — ABNORMAL LOW (ref 4.14–5.80)
RDW: 14.5 % (ref 12.3–15.4)
WBC: 6.7 10*3/uL (ref 3.4–10.8)

## 2017-10-02 LAB — LAMOTRIGINE LEVEL: Lamotrigine Lvl: 4.7 ug/mL (ref 2.0–20.0)

## 2017-10-03 ENCOUNTER — Telehealth: Payer: Self-pay | Admitting: Neurology

## 2017-10-03 NOTE — Telephone Encounter (Signed)
Pt's daughter returned RN's call. Msg relayed, she was appreciative

## 2017-10-03 NOTE — Telephone Encounter (Signed)
-----   Message from Ward Givens, NP sent at 10/03/2017 11:14 AM EST ----- Blood work relatively unremarkable.  Please call patient with results

## 2017-10-03 NOTE — Telephone Encounter (Signed)
Called the patient to discuss lab results, no answer. LVM for them to call back. If patient calls back please make them aware that the lab work was normal.

## 2017-10-10 ENCOUNTER — Emergency Department (HOSPITAL_COMMUNITY): Payer: Medicare Other

## 2017-10-10 ENCOUNTER — Encounter (HOSPITAL_COMMUNITY): Payer: Self-pay | Admitting: Internal Medicine

## 2017-10-10 ENCOUNTER — Inpatient Hospital Stay (HOSPITAL_COMMUNITY): Payer: Medicare Other

## 2017-10-10 ENCOUNTER — Inpatient Hospital Stay (HOSPITAL_COMMUNITY)
Admission: EM | Admit: 2017-10-10 | Discharge: 2017-10-13 | DRG: 392 | Disposition: A | Discharge status: Nursing Home (Includes ICF) | Payer: Medicare Other | Attending: Internal Medicine | Admitting: Internal Medicine

## 2017-10-10 DIAGNOSIS — I5032 Chronic diastolic (congestive) heart failure: Secondary | ICD-10-CM | POA: Diagnosis present

## 2017-10-10 DIAGNOSIS — I10 Essential (primary) hypertension: Secondary | ICD-10-CM | POA: Diagnosis present

## 2017-10-10 DIAGNOSIS — Z9842 Cataract extraction status, left eye: Secondary | ICD-10-CM | POA: Diagnosis not present

## 2017-10-10 DIAGNOSIS — K5900 Constipation, unspecified: Secondary | ICD-10-CM | POA: Diagnosis present

## 2017-10-10 DIAGNOSIS — K219 Gastro-esophageal reflux disease without esophagitis: Secondary | ICD-10-CM | POA: Diagnosis present

## 2017-10-10 DIAGNOSIS — G20 Parkinson's disease: Secondary | ICD-10-CM | POA: Diagnosis present

## 2017-10-10 DIAGNOSIS — Z9981 Dependence on supplemental oxygen: Secondary | ICD-10-CM

## 2017-10-10 DIAGNOSIS — I110 Hypertensive heart disease with heart failure: Secondary | ICD-10-CM | POA: Diagnosis present

## 2017-10-10 DIAGNOSIS — Z9181 History of falling: Secondary | ICD-10-CM

## 2017-10-10 DIAGNOSIS — I480 Paroxysmal atrial fibrillation: Secondary | ICD-10-CM | POA: Diagnosis present

## 2017-10-10 DIAGNOSIS — F0390 Unspecified dementia without behavioral disturbance: Secondary | ICD-10-CM | POA: Diagnosis present

## 2017-10-10 DIAGNOSIS — L89520 Pressure ulcer of left ankle, unstageable: Secondary | ICD-10-CM | POA: Diagnosis present

## 2017-10-10 DIAGNOSIS — G40909 Epilepsy, unspecified, not intractable, without status epilepticus: Secondary | ICD-10-CM | POA: Diagnosis present

## 2017-10-10 DIAGNOSIS — Z820 Family history of epilepsy and other diseases of the nervous system: Secondary | ICD-10-CM

## 2017-10-10 DIAGNOSIS — G250 Essential tremor: Secondary | ICD-10-CM | POA: Diagnosis present

## 2017-10-10 DIAGNOSIS — Z85820 Personal history of malignant melanoma of skin: Secondary | ICD-10-CM

## 2017-10-10 DIAGNOSIS — R000 Tachycardia, unspecified: Secondary | ICD-10-CM | POA: Diagnosis present

## 2017-10-10 DIAGNOSIS — K56609 Unspecified intestinal obstruction, unspecified as to partial versus complete obstruction: Secondary | ICD-10-CM | POA: Diagnosis not present

## 2017-10-10 DIAGNOSIS — N400 Enlarged prostate without lower urinary tract symptoms: Secondary | ICD-10-CM | POA: Diagnosis present

## 2017-10-10 DIAGNOSIS — H9190 Unspecified hearing loss, unspecified ear: Secondary | ICD-10-CM | POA: Diagnosis present

## 2017-10-10 DIAGNOSIS — K440 Diaphragmatic hernia with obstruction, without gangrene: Principal | ICD-10-CM | POA: Diagnosis present

## 2017-10-10 DIAGNOSIS — Z888 Allergy status to other drugs, medicaments and biological substances status: Secondary | ICD-10-CM

## 2017-10-10 DIAGNOSIS — Z961 Presence of intraocular lens: Secondary | ICD-10-CM | POA: Diagnosis present

## 2017-10-10 DIAGNOSIS — I340 Nonrheumatic mitral (valve) insufficiency: Secondary | ICD-10-CM | POA: Diagnosis present

## 2017-10-10 DIAGNOSIS — G20A1 Parkinson's disease without dyskinesia, without mention of fluctuations: Secondary | ICD-10-CM | POA: Diagnosis present

## 2017-10-10 DIAGNOSIS — Z9841 Cataract extraction status, right eye: Secondary | ICD-10-CM

## 2017-10-10 DIAGNOSIS — Z66 Do not resuscitate: Secondary | ICD-10-CM | POA: Diagnosis present

## 2017-10-10 DIAGNOSIS — K449 Diaphragmatic hernia without obstruction or gangrene: Secondary | ICD-10-CM

## 2017-10-10 DIAGNOSIS — Z7982 Long term (current) use of aspirin: Secondary | ICD-10-CM

## 2017-10-10 DIAGNOSIS — E876 Hypokalemia: Secondary | ICD-10-CM | POA: Diagnosis present

## 2017-10-10 DIAGNOSIS — R112 Nausea with vomiting, unspecified: Secondary | ICD-10-CM | POA: Diagnosis not present

## 2017-10-10 DIAGNOSIS — Z79899 Other long term (current) drug therapy: Secondary | ICD-10-CM

## 2017-10-10 DIAGNOSIS — R1111 Vomiting without nausea: Secondary | ICD-10-CM

## 2017-10-10 LAB — COMPREHENSIVE METABOLIC PANEL
ALBUMIN: 4.4 g/dL (ref 3.5–5.0)
ALK PHOS: 92 U/L (ref 38–126)
ALT: 21 U/L (ref 17–63)
ANION GAP: 14 (ref 5–15)
AST: 31 U/L (ref 15–41)
BUN: 29 mg/dL — ABNORMAL HIGH (ref 6–20)
CHLORIDE: 95 mmol/L — AB (ref 101–111)
CO2: 29 mmol/L (ref 22–32)
Calcium: 9.4 mg/dL (ref 8.9–10.3)
Creatinine, Ser: 0.89 mg/dL (ref 0.61–1.24)
GFR calc Af Amer: >60 mL/min (ref >60)
GFR calc non Af Amer: >60 mL/min (ref >60)
GLUCOSE: 155 mg/dL — AB (ref 65–99)
POTASSIUM: 3.8 mmol/L (ref 3.5–5.1)
SODIUM: 138 mmol/L (ref 135–145)
Total Bilirubin: 0.8 mg/dL (ref 0.3–1.2)
Total Protein: 7.9 g/dL (ref 6.5–8.1)

## 2017-10-10 LAB — CBC
HEMATOCRIT: 41.6 % (ref 39.0–52.0)
HEMOGLOBIN: 14.3 g/dL (ref 13.0–17.0)
MCH: 32.1 pg (ref 26.0–34.0)
MCHC: 34.4 g/dL (ref 30.0–36.0)
MCV: 93.5 fL (ref 78.0–100.0)
Platelets: 276 10*3/uL (ref 150–400)
RBC: 4.45 MIL/uL (ref 4.22–5.81)
RDW: 13.8 % (ref 11.5–15.5)
WBC: 13.2 10*3/uL — ABNORMAL HIGH (ref 4.0–10.5)

## 2017-10-10 LAB — URINALYSIS, ROUTINE W REFLEX MICROSCOPIC
BILIRUBIN URINE: NEGATIVE
Glucose, UA: NEGATIVE mg/dL
Hgb urine dipstick: NEGATIVE
KETONES UR: 5 mg/dL — AB
Leukocytes, UA: NEGATIVE
Nitrite: NEGATIVE
PH: 5 (ref 5.0–8.0)
PROTEIN: 100 mg/dL — AB
Specific Gravity, Urine: 1.026 (ref 1.005–1.030)

## 2017-10-10 LAB — MAGNESIUM: Magnesium: 2.3 mg/dL (ref 1.7–2.4)

## 2017-10-10 LAB — I-STAT TROPONIN, ED: Troponin i, poc: 0.02 ng/mL (ref 0.00–0.08)

## 2017-10-10 LAB — I-STAT CG4 LACTIC ACID, ED: LACTIC ACID, VENOUS: 1.78 mmol/L (ref 0.5–1.9)

## 2017-10-10 LAB — LIPASE, BLOOD: LIPASE: 26 U/L (ref 11–51)

## 2017-10-10 MED ORDER — PANTOPRAZOLE SODIUM 40 MG IV SOLR
40.0000 mg | Freq: Once | INTRAVENOUS | Status: AC
  Administered 2017-10-10: 40 mg via INTRAVENOUS
  Filled 2017-10-10: qty 40

## 2017-10-10 MED ORDER — SODIUM CHLORIDE 0.9 % IV BOLUS (SEPSIS)
500.0000 mL | Freq: Once | INTRAVENOUS | Status: AC
  Administered 2017-10-10: 500 mL via INTRAVENOUS

## 2017-10-10 MED ORDER — IOPAMIDOL (ISOVUE-300) INJECTION 61%
INTRAVENOUS | Status: AC
  Filled 2017-10-10: qty 30

## 2017-10-10 MED ORDER — IPRATROPIUM-ALBUTEROL 0.5-2.5 (3) MG/3ML IN SOLN: 3.0000 mL | Freq: Four times a day (QID) | RESPIRATORY_TRACT | Status: DC | PRN

## 2017-10-10 MED ORDER — ONDANSETRON HCL 4 MG PO TABS: 4.0000 mg | ORAL_TABLET | Freq: Four times a day (QID) | ORAL | Status: DC | PRN

## 2017-10-10 MED ORDER — LAMOTRIGINE 100 MG PO TABS
100.0000 mg | ORAL_TABLET | Freq: Two times a day (BID) | ORAL | Status: DC
  Administered 2017-10-10 – 2017-10-13 (×6): 100 mg via ORAL
  Filled 2017-10-10 (×6): qty 1

## 2017-10-10 MED ORDER — ONDANSETRON HCL 4 MG/2ML IJ SOLN: 4.0000 mg | Freq: Four times a day (QID) | INTRAMUSCULAR | Status: DC | PRN

## 2017-10-10 MED ORDER — ENOXAPARIN SODIUM 40 MG/0.4ML ~~LOC~~ SOLN
40.0000 mg | SUBCUTANEOUS | Status: DC
  Administered 2017-10-10 – 2017-10-12 (×3): 40 mg via SUBCUTANEOUS
  Filled 2017-10-10 (×3): qty 0.4

## 2017-10-10 MED ORDER — LACOSAMIDE 50 MG PO TABS
50.0000 mg | ORAL_TABLET | Freq: Every day | ORAL | Status: DC
  Administered 2017-10-11: 50 mg via ORAL
  Filled 2017-10-10: qty 1

## 2017-10-10 MED ORDER — IOPAMIDOL (ISOVUE-300) INJECTION 61%: 30.0000 mL | Freq: Once | INTRAVENOUS | Status: DC | PRN

## 2017-10-10 MED ORDER — SODIUM CHLORIDE 0.9 % IV SOLN
INTRAVENOUS | Status: DC
Start: 2017-10-10 — End: 2017-10-12
  Administered 2017-10-10: 16:00:00 via INTRAVENOUS

## 2017-10-10 MED ORDER — SODIUM CHLORIDE 0.9 % IV SOLN
Freq: Once | INTRAVENOUS | Status: AC
  Administered 2017-10-10: 08:00:00 via INTRAVENOUS

## 2017-10-10 MED ORDER — ONDANSETRON HCL 4 MG/2ML IJ SOLN
4.0000 mg | Freq: Once | INTRAMUSCULAR | Status: AC
  Administered 2017-10-10: 4 mg via INTRAVENOUS
  Filled 2017-10-10: qty 2

## 2017-10-10 MED ORDER — MORPHINE SULFATE (PF) 2 MG/ML IV SOLN: 2.0000 mg | INTRAVENOUS | Status: DC | PRN

## 2017-10-10 MED ORDER — AMLODIPINE BESYLATE 5 MG PO TABS
5.0000 mg | ORAL_TABLET | Freq: Every day | ORAL | Status: DC
  Administered 2017-10-10: 5 mg via ORAL
  Filled 2017-10-10: qty 1

## 2017-10-10 MED ORDER — ASPIRIN 81 MG PO CHEW
81.0000 mg | CHEWABLE_TABLET | Freq: Every evening | ORAL | Status: DC
  Administered 2017-10-10 – 2017-10-12 (×3): 81 mg via ORAL
  Filled 2017-10-10 (×3): qty 1

## 2017-10-10 MED ORDER — LIDOCAINE VISCOUS 2 % MT SOLN
15.0000 mL | Freq: Once | OROMUCOSAL | Status: AC
  Administered 2017-10-10: 15 mL via OROMUCOSAL
  Filled 2017-10-10: qty 15

## 2017-10-10 MED ORDER — LACOSAMIDE 50 MG PO TABS
200.0000 mg | ORAL_TABLET | Freq: Every evening | ORAL | Status: DC
  Administered 2017-10-10: 200 mg via ORAL
  Filled 2017-10-10: qty 4

## 2017-10-10 NOTE — ED Notes (Signed)
ED TO INPATIENT HANDOFF REPORT  Name/Age/Gender Barry Dean 84 y.o. male  Code Status Code Status History    Date Active Date Inactive Code Status Order ID Comments User Context   12/19/2016 19:46 12/23/2016 15:02 DNR [id number]  Ivor Costa, MD ED   03/12/2016 15:55 03/23/2016 19:28 DNR [id number]  Radene Gunning, NP ED   03/05/2015 16:07 03/07/2015 16:27 Full Code [id number]  Samella Parr, NP Inpatient   09/14/2014 04:27 09/17/2014 19:19 DNR [id number]  Lum Babe, RN Inpatient   09/14/2014 01:58 09/14/2014 04:27 Full Code [id number]  [name], MD ED   04/24/2014 22:41 04/27/2014 17:50 DNR [id number]  Berle Mull, MD ED   09/13/2013 15:44 09/17/2013 19:25 DNR [id number]  Elmarie Shiley, MD Inpatient   09/13/2013 15:09 09/13/2013 15:44 Full Code [id number]  Elmarie Shiley, MD Inpatient   11/10/2012 04:14 11/12/2012 16:56 DNR [id number]  [name], MD ED    Questions for Most Recent Historical Code Status (Order [id number])    Question Answer Comment   In the event of cardiac or respiratory ARREST Do not call a "code blue"    In the event of cardiac or respiratory ARREST Do not perform Intubation, CPR, defibrillation or ACLS    In the event of cardiac or respiratory ARREST Use medication by any route, position, wound care, and other measures to relive pain and suffering. May use oxygen, suction and manual treatment of airway obstruction as needed for comfort.       Home/SNF/Other Nursing Home  Chief Complaint Vomiting  Level of Care/Admitting Diagnosis ED Disposition    ED Disposition Condition Comment   Admit  Hospital Area: [facility] [[id number]]  Level of Care: Med-Surg [16]  Diagnosis: SBO (small bowel obstruction) [facility]) [[id number]]  Admitting Physician: Cristy Folks [[id number]]  Attending Physician: Cristy Folks [[id number]]  Estimated length of stay: past midnight tomorrow  Certification:: I certify this patient will need inpatient services for  at least 2 midnights  PT Class (Do Not Modify): Inpatient [101]  PT Acc Code (Do Not Modify): Private [1]       Medical History Past Medical History:  Diagnosis Date  . Abnormality of gait   . Anxiety   . Arthritis   . Atrial fibrillation ([place])   . BPH (benign prostatic hyperplasia)   . Deafness   . Dehydration   . Depression   . GERD (gastroesophageal reflux disease)   . Hypertension   . Melanoma ([place])   . Mitral regurgitation   . Reflux   . Seizure ([place])   . Seizures ([place])   . Syncope   . Tremor 09/27/2015  . Tremor, essential     Allergies Allergies  Allergen Reactions  . Topamax Other (See Comments)    Reaction:  Unknown   . Uroxatral [Alfuzosin Hydrochloride] Other (See Comments)    Reaction:  Unknown   . Valium Other (See Comments)    Reaction:  Unknown     IV Location/Drains/Wounds Patient Lines/Drains/Airways Status   Active Line/Drains/Airways    Name:   Placement date:   Placement time:   Site:   Days:   Peripheral IV 10/10/17 Right Antecubital   10/10/17    0745    Antecubital   less than 1   NG/OG Tube Nasogastric 16 Fr. Right nare Aucultation;Xray Documented cm marking at nare/ corner of mouth   10/10/17    1344    Right nare   less than 1  Labs/Imaging Results for orders placed or performed during the hospital encounter of 10/10/17 (from the past 48 hour(s))  Lipase, blood     Status: None   Collection Time: 10/10/17  6:22 AM  Result Value Ref Range   Lipase 26 11 - 51 U/L    Comment: Performed at [facility], [place] [address], [place], [place] [address]  Comprehensive metabolic panel     Status: Abnormal   Collection Time: 10/10/17  6:22 AM  Result Value Ref Range   Sodium 138 135 - 145 mmol/L   Potassium 3.8 3.5 - 5.1 mmol/L   Chloride 95 (L) 101 - 111 mmol/L   CO2 29 22 - 32 mmol/L   Glucose, Bld 155 (H) 65 - 99 mg/dL   BUN 29 (H) 6 - 20 mg/dL   Creatinine, Ser 0.89 0.61 - 1.24 mg/dL   Calcium 9.4 8.9 -  10.3 mg/dL   Total Protein 7.9 6.5 - 8.1 g/dL   Albumin 4.4 3.5 - 5.0 g/dL   AST 31 15 - 41 U/L   ALT 21 17 - 63 U/L   Alkaline Phosphatase 92 38 - 126 U/L   Total Bilirubin 0.8 0.3 - 1.2 mg/dL   GFR calc non Af Amer >60 >60 mL/min   GFR calc Af Amer >60 >60 mL/min    Comment: (NOTE) The eGFR has been calculated using the CKD EPI equation. This calculation has not been validated in all clinical situations. eGFR's persistently <60 mL/min signify possible Chronic Kidney Disease.    Anion gap 14 5 - 15    Comment: Performed at [facility], [place] [address], [place], [place] [address]  CBC     Status: Abnormal   Collection Time: 10/10/17  6:22 AM  Result Value Ref Range   WBC 13.2 (H) 4.0 - 10.5 K/uL   RBC 4.45 4.22 - 5.81 MIL/uL   Hemoglobin 14.3 13.0 - 17.0 g/dL   HCT 41.6 39.0 - 52.0 %   MCV 93.5 78.0 - 100.0 fL   MCH 32.1 26.0 - 34.0 pg   MCHC 34.4 30.0 - 36.0 g/dL   RDW 13.8 11.5 - 15.5 %   Platelets 276 150 - 400 K/uL    Comment: Performed at [facility], [place] [address] [place], [place] [address]  Magnesium     Status: None   Collection Time: 10/10/17  8:13 AM  Result Value Ref Range   Magnesium 2.3 1.7 - 2.4 mg/dL    Comment: Performed at [facility], [place] [address], [place], [place] [address]  I-stat troponin, ED     Status: None   Collection Time: 10/10/17  8:18 AM  Result Value Ref Range   Troponin i, poc 0.02 0.00 - 0.08 ng/mL   Comment 3            Comment: Due to the release kinetics of cTnI, a negative result within the first hours of the onset of symptoms does not rule out myocardial infarction with certainty. If myocardial infarction is still suspected, repeat the test at appropriate intervals.   I-Stat CG4 Lactic Acid, ED     Status: None   Collection Time: 10/10/17  8:27 AM  Result Value Ref Range   Lactic Acid, Venous 1.78 0.5 - 1.9 mmol/L  Urinalysis, Routine w reflex microscopic     Status:  Abnormal   Collection Time: 10/10/17  9:23 AM  Result Value Ref Range   Color, Urine YELLOW  YELLOW   APPearance CLEAR CLEAR   Specific Gravity, Urine 1.026 1.005 - 1.030   pH 5.0 5.0 - 8.0   Glucose, UA NEGATIVE NEGATIVE mg/dL   Hgb urine dipstick NEGATIVE NEGATIVE   Bilirubin Urine NEGATIVE NEGATIVE   Ketones, ur 5 (A) NEGATIVE mg/dL   Protein, ur 100 (A) NEGATIVE mg/dL   Nitrite NEGATIVE NEGATIVE   Leukocytes, UA NEGATIVE NEGATIVE   RBC / HPF 0-5 0 - 5 RBC/hpf   WBC, UA 0-5 0 - 5 WBC/hpf   Bacteria, UA RARE (A) NONE SEEN   Squamous Epithelial / LPF 0-5 (A) NONE SEEN   Mucus PRESENT    Hyaline Casts, UA PRESENT     Comment: Performed at [facility], [place] [address] [place], [place] [address]   Ct Abdomen Pelvis Wo Contrast  Result Date: 10/10/2017 CLINICAL DATA:  Abdominal distention and vomiting EXAM: CT ABDOMEN AND PELVIS WITHOUT CONTRAST TECHNIQUE: Multidetector CT imaging of the abdomen and pelvis was performed following the standard protocol without IV contrast. COMPARISON:  12/19/2016 FINDINGS: Despite efforts by the technologist and patient, motion artifact is present on today's exam and could not be eliminated. This reduces exam sensitivity and specificity. Lower chest: Reticulonodular interstitial opacities at both lung bases potentially from atypical infectious process. Calcified reticulonodular opacities in the right middle lobe, unchanged from prior. Passive atelectasis associated with the large hiatal hernia which contains most of the stomach. The intrathoracic portion of the stomach is distended and contains an air-fluid level. Centrilobular emphysema. Coronary, aortic arch, and branch vessel atherosclerotic vascular disease. The prior trace left pleural effusion has resolved. Hepatobiliary: Unremarkable Pancreas: Indistinct pancreas due to atrophy and motion artifact, no gross abnormality observed. Spleen: Unremarkable Adrenals/Urinary Tract: Unremarkable  Stomach/Bowel: There is stool throughout the colon. Loops of dilated proximal small bowel up to 4.1 cm are noted with internal air-fluid levels. This includes the duodenum. In the mid small bowel there is a transition from fluid-filled mildly dilated loops to nondilated small bowel in the left abdomen, with some slight angulation of the loop which could reflect a low-grade adhesion. Vascular/Lymphatic: Aortoiliac atherosclerotic vascular disease. No pathologic adenopathy. Reproductive: Unremarkable Other: Trace amount of free pelvic fluid. Musculoskeletal: Levoconvex lumbar scoliosis with rotary component. Healed deformity in the left humeral shaft. 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 Large hiatal hernia with intrathoracic portion of the stomach dilated with an air-fluid level, and with dilated proximal loops of small bowel terminating in a mild angulation which could conceivably be from an adhesion. The small bowel distal to this point does not appear particularly dilated. The transition is shown on images 55-74 of series 5. 2. 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 3. Aortic Atherosclerosis (ICD10-I70.0) and Emphysema (ICD10-J43.9). Coronary atherosclerosis. 4. Trace amount of free pelvic fluid. 5. 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 Levoconvex lumbar scoliosis with rotary component. Electronically Signed   By: Van Clines M.D.   On: 10/10/2017 10:32   Dg Chest Port 1 View  Result Date: 10/10/2017 CLINICAL DATA:  Nausea, vomiting. EXAM: PORTABLE CHEST 1 VIEW COMPARISON:  Radiograph of May 18, 2017. FINDINGS: Stable cardiomegaly. Atherosclerosis of thoracic aorta is noted. No pneumothorax or pleural effusion is noted. Stable hiatal hernia is noted. Stable bibasilar scarring  is noted. No acute abnormality is noted. Bony thorax is unremarkable. IMPRESSION: Aortic atherosclerosis.  Stable hiatal hernia. Stable bibasilar scarring. Electronically Signed   By: Marijo Conception, M.D.   On: 10/10/2017 08:48    Pending Labs Unresulted Labs (From admission, onward)   Start     Ordered   10/10/17 0748  Urine culture  STAT,   STAT     10/10/17 0749   Signed and Held  Basic metabolic panel  Tomorrow morning,   R     Signed and Held   Signed and Held  CBC  Tomorrow morning,   R     Signed and Held      Vitals/Pain Today's Vitals   10/10/17 0930 10/10/17 1230 10/10/17 1245 10/10/17 1300  BP: (!) 171/88 (!) 180/90 (!) 170/94 (!) 178/96  Pulse:  100 (!) 105   Resp: 13 20 (!) 22 (!) 21  Temp:      TempSrc:      SpO2:  (!) 86% (!) 81%   PainSc:        Isolation Precautions No active isolations  Medications Medications  iopamidol (ISOVUE-300) 61 % injection 30 mL (not administered)  iopamidol (ISOVUE-300) 61 % injection (not administered)  sodium chloride 0.9 % bolus 500 mL (0 mLs Intravenous Stopped 10/10/17 1030)  0.9 %  sodium chloride infusion ( Intravenous New Bag/Given 10/10/17 0817)  pantoprazole (PROTONIX) injection 40 mg (40 mg Intravenous Given 10/10/17 1151)  ondansetron (ZOFRAN) injection 4 mg (4 mg Intravenous Given 10/10/17 1151)  lidocaine (XYLOCAINE) 2 % viscous mouth solution 15 mL (15 mLs Mouth/Throat Given 10/10/17 1342)    Mobility non-ambulatory

## 2017-10-10 NOTE — ED Provider Notes (Signed)
[facility] Provider Note   CSN: [id number] Arrival date & time: 10/10/17  0556     History   Chief Complaint Chief Complaint  Patient presents with  . Emesis    HPI Barry Dean is a 84 y.o. male.  HPI At this time, very limited history.  EMS indicates patient is from assisted living with 1 day of vomiting.  The patient is very hard of hearing and difficult to level of dementia.  Initially, patient seemed poorly responsive and not particularly verbal.  However with very loud voice he was more awake and responded to questions.  Despite evidence of recent vomiting and appearance of guarding with abdominal exam, patient denied that he felt poorly and stated "I feel pretty good".  He continued talking but it was difficult to understand and not clear that it was situationally related. Past Medical History:  Diagnosis Date  . Abnormality of gait   . Anxiety   . Arthritis   . Atrial fibrillation ([place])   . BPH (benign prostatic hyperplasia)   . Deafness   . Dehydration   . Depression   . GERD (gastroesophageal reflux disease)   . Hypertension   . Melanoma ([place])   . Mitral regurgitation   . Reflux   . Seizure ([place])   . Seizures ([place])   . Syncope   . Tremor 09/27/2015  . Tremor, essential     Patient Active Problem List   Diagnosis Date Noted  . SBO (small bowel obstruction) ([place]) 10/10/2017  . Hiatal hernia 10/10/2017  . Pressure injury of skin 12/22/2016  . Aspiration pneumonia of both lower lobes due to gastric secretions ([place]) 12/20/2016  . Symptomatic anemia 12/19/2016  . Fall 12/19/2016  . Elevated troponin 12/19/2016  . Acute on chronic diastolic heart failure ([place]) 03/22/2016  . Sepsis, unspecified organism ([place])   . Hypokalemia   . Hypomagnesemia   . Sepsis due to pneumonia ([employer])   . Sepsis ([place]) 03/12/2016  . Nausea & vomiting 03/12/2016  . Diarrhea 03/12/2016  . Acute encephalopathy 03/12/2016  . Acute kidney  injury ([place]) 03/12/2016  . Acute respiratory failure with hypoxia ([place]) 03/12/2016  . Septic shock ([place])   . Abnormality of gait 09/27/2015  . Tremor 09/27/2015  . Undiagnosed cardiac murmurs 03/06/2015  . Syncope and collapse 03/05/2015  . Parkinson's disease ([place]) 03/05/2015  . PAF (paroxysmal atrial fibrillation) ([place]) 03/05/2015  . Bradycardia 03/05/2015  . Deafness 03/05/2015  . Mild mitral regurgitation 03/05/2015  . Emesis, persistent 09/14/2014  . Benign essential HTN 09/14/2014  . Syncope 09/13/2013  . ?? Respiratory failure 09/13/2013  . Noninfectious gastroenteritis and colitis 11/12/2012  . Acute and chronic respiratory failure 11/12/2012  . Community acquired pneumonia 11/12/2012  . HCAP (healthcare-associated pneumonia) 07/18/2011  . Seizure disorder ([place]) 07/18/2011  . HTN (hypertension) 07/18/2011  . Hyponatremia 07/18/2011    Past Surgical History:  Procedure Laterality Date  . APPENDECTOMY    . CATARACT EXTRACTION W/ INTRAOCULAR LENS  IMPLANT, BILATERAL    . ESOPHAGOGASTRODUODENOSCOPY (EGD) WITH PROPOFOL N/A 09/15/2014   Procedure: ESOPHAGOGASTRODUODENOSCOPY (EGD) WITH PROPOFOL;  Surgeon: [name], MD;  Location: [facility] ENDOSCOPY;  Service: Endoscopy;  Laterality: N/A;  . unknown         Home Medications    Prior to Admission medications   Medication Sig Start Date End Date Taking? Authorizing Provider  amLODipine (NORVASC) 5 MG tablet Take 1 tablet (5 mg total) by mouth daily. 12/23/16  Deveron Furlong, MD  aspirin 81 MG chewable tablet Chew 1 tablet (81 mg total) by mouth every evening. 09/22/14  Yes Thurnell Lose, MD  ferrous sulfate 325 (65 FE) MG tablet Take 1 tablet (325 mg total) by mouth 2 (two) times daily with a meal. 12/22/16  Yes Tat, David, MD  ipratropium-albuterol (DUONEB) 0.5-2.5 (3) MG/3ML SOLN Take 3 mLs by nebulization every 6 (six) hours as needed. Patient taking differently: Take 3 mLs by nebulization every 6 (six) hours as needed  (for shortness of breath).  03/22/16  Yes Eugenie Filler, MD  Lacosamide (VIMPAT) 100 MG TABS Take 200 mg by mouth every evening.   Yes [provider]  lacosamide (VIMPAT) 50 MG TABS tablet Take 1 tablet in the AM (50 mg) and 2 tablets in the evening Patient taking differently: Take 50 mg by mouth every morning. Take 1 tablet in the AM (50 mg). 06/14/17  Yes Kathrynn Ducking, MD  lamoTRIgine (LAMICTAL) 100 MG tablet Take 1 tablet (100 mg total) by mouth 2 (two) times daily. 12/13/16  Yes Kathrynn Ducking, MD  loperamide (IMODIUM) 2 MG capsule Take by mouth as needed for diarrhea or loose stools.   Yes [provider]  mineral oil external liquid Place 3 drops into both ears every Friday.    Yes [provider]  Multiple Vitamin (MULTIVITAMIN) capsule Take 1 capsule by mouth daily.   Yes [provider]  ondansetron (ZOFRAN) 4 MG tablet Take 4 mg by mouth every 8 (eight) hours as needed for nausea or vomiting.   Yes [provider]  pantoprazole (PROTONIX) 20 MG tablet Take 20 mg by mouth daily.   Yes [provider]  polyethylene glycol (MIRALAX) packet Take 17 g by mouth daily. 12/22/16  Yes Tat, Shanon Brow, MD  sennosides-docusate sodium (SENOKOT-S) 8.6-50 MG tablet Take 1 tablet by mouth at bedtime as needed for constipation.    Yes [provider]  vitamin B-12 (CYANOCOBALAMIN) 1000 MCG tablet Take 2,000 mcg by mouth daily.   Yes [provider]  feeding supplement, ENSURE ENLIVE, (ENSURE ENLIVE) LIQD Take 237 mLs by mouth daily. 03/22/16   Eugenie Filler, MD  OXYGEN Inhale 2 L into the lungs See admin instructions. Use 2L continuous at bedtime. Use 2L as needed for shortness of breath.    [provider]    Family History Family History  Problem Relation Age of Onset  . Cancer Mother        Skin cancer  . Stroke Father   . Parkinsonism Sister   . Diabetes Brother     Social History Social History    Tobacco Use  . Smoking status: Never Smoker  . Smokeless tobacco: Never Used  Substance Use Topics  . Alcohol use: No  . Drug use: No     Allergies   Topamax; Uroxatral [alfuzosin hydrochloride]; and Valium   Review of Systems Review of Systems Level 5 caveat cannot obtain review of systems, deafness and dementia.  Physical Exam Updated Vital Signs BP (!) 151/70 (BP Location: Right Arm)   Pulse 85   Temp 98.6 F (37 C) (Oral)   Resp 16   Wt 80.7 kg (177 lb 14.6 oz)   SpO2 97%   BMI 25.53 kg/m   Physical Exam  Constitutional:  Patient is sleeping as I enter the room.  Respirations are nonlabored.  Heart rate sinus in the 90s.  Oxygen saturation 93% on room air.  Patient  has a small amount of brown emesis on his gown.  HENT:  Head: Normocephalic and atraumatic.  Because membranes dry.  Eyes: EOM are normal.  Cardiovascular: Normal rate, regular rhythm and normal heart sounds.  Pulmonary/Chest: Effort normal and breath sounds normal.  Abdominal:  Abdomen is soft but he does seem to have occasional diffuse guarding.  Patient had been sleeping during my exam and not awakening easily but with abdominal exam he opened his eyes and watched me.  Musculoskeletal:  No significant peripheral edema.  Patient does have a pressure ulcer on the left lateral ankle.  This is dressed.  Mild surrounding erythema.  See attached image.  Neurological:  Once awakened, patient does seem alert.  Converses with me albeit he is very difficult to understand.  Patient did not make many spontaneous movements of his extremities.  But no focal deficit.  Skin: Skin is warm and dry.     ED Treatments / Results  Labs (all labs ordered are listed, but only abnormal results are displayed) Labs Reviewed  COMPREHENSIVE METABOLIC PANEL - Abnormal; Notable for the following components:      Result Value   Chloride 95 (*)    Glucose, Bld 155 (*)    BUN 29 (*)    All other components within normal  limits  CBC - Abnormal; Notable for the following components:   WBC 13.2 (*)    All other components within normal limits  URINALYSIS, ROUTINE W REFLEX MICROSCOPIC - Abnormal; Notable for the following components:   Ketones, ur 5 (*)    Protein, ur 100 (*)    Bacteria, UA RARE (*)    Squamous Epithelial / LPF 0-5 (*)    All other components within normal limits  BASIC METABOLIC PANEL - Abnormal; Notable for the following components:   Potassium 3.2 (*)    BUN 24 (*)    Calcium 8.3 (*)    All other components within normal limits  CBC - Abnormal; Notable for the following components:   RBC 3.77 (*)    Hemoglobin 12.2 (*)    HCT 35.3 (*)    All other components within normal limits  URINE CULTURE  MRSA PCR SCREENING  LIPASE, BLOOD  MAGNESIUM  CBC  BASIC METABOLIC PANEL  MAGNESIUM  I-STAT TROPONIN, ED  I-STAT CG4 LACTIC ACID, ED    EKG  EKG Interpretation  Date/Time:  Thursday October 10 2017 08:09:58 EST Ventricular Rate:  96 PR Interval:    QRS Duration: 84 QT Interval:  344 QTC Calculation: 435 R Axis:   176 Text Interpretation:  Sinus rhythm Atrial premature complex Right axis deviation Abnormal R-wave progression, late transition no change from previous Confirmed by Charlesetta Shanks [phone]) on 10/10/2017 9:08:07 AM       Radiology Ct Abdomen Pelvis Wo Contrast  Result Date: 10/10/2017 CLINICAL DATA:  Abdominal distention and vomiting EXAM: CT ABDOMEN AND PELVIS WITHOUT CONTRAST TECHNIQUE: Multidetector CT imaging of the abdomen and pelvis was performed following the standard protocol without IV contrast. COMPARISON:  12/19/2016 FINDINGS: Despite efforts by the technologist and patient, motion artifact is present on today's exam and could not be eliminated. This reduces exam sensitivity and specificity. Lower chest: Reticulonodular interstitial opacities at both lung bases potentially from atypical infectious process. Calcified reticulonodular opacities in the right  middle lobe, unchanged from prior. Passive atelectasis associated with the large hiatal hernia which contains most of the stomach. The intrathoracic portion of the stomach is distended and contains an air-fluid level.  Centrilobular emphysema. Coronary, aortic arch, and branch vessel atherosclerotic vascular disease. The prior trace left pleural effusion has resolved. Hepatobiliary: Unremarkable Pancreas: Indistinct pancreas due to atrophy and motion artifact, no gross abnormality observed. Spleen: Unremarkable Adrenals/Urinary Tract: Unremarkable Stomach/Bowel: There is stool throughout the colon. Loops of dilated proximal small bowel up to 4.1 cm are noted with internal air-fluid levels. This includes the duodenum. In the mid small bowel there is a transition from fluid-filled mildly dilated loops to nondilated small bowel in the left abdomen, with some slight angulation of the loop which could reflect a low-grade adhesion. Vascular/Lymphatic: Aortoiliac atherosclerotic vascular disease. No pathologic adenopathy. Reproductive: Unremarkable Other: Trace amount of free pelvic fluid. Musculoskeletal: Levoconvex lumbar scoliosis with rotary component. Healed deformity in the left humeral shaft. 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 Large hiatal hernia with intrathoracic portion of the stomach dilated with an air-fluid level, and with dilated proximal loops of small bowel terminating in a mild angulation which could conceivably be from an adhesion. The small bowel distal to this point does not appear particularly dilated. The transition is shown on images 55-74 of series 5. 2. 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 3. Aortic Atherosclerosis (ICD10-I70.0) and Emphysema (ICD10-J43.9). Coronary atherosclerosis. 4. Trace amount of free pelvic fluid. 5. 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 Levoconvex lumbar scoliosis with rotary component. Electronically Signed   By: Van Clines M.D.   On: 10/10/2017 10:32   Dg Abd 1 View  Result Date: 10/11/2017 CLINICAL DATA:  Follow-up small bowel obstruction EXAM: ABDOMEN - 1 VIEW COMPARISON:  KUB October 10, 2017 FINDINGS: There is a large stool ball in the rectum. Contrast is seen throughout the colon. Air-filled loops of large and small bowel are identified. IMPRESSION: Air-filled loops of large and small bowel are identified. This could represent ileus or partial small bowel obstruction. There is contrast throughout the colon and a large stool ball in the rectum. Electronically Signed   By: Dorise Bullion III M.D   On: 10/11/2017 15:00   Dg Abdomen 1 View  Result Date: 10/10/2017 CLINICAL DATA:  Status post nasogastric tube placement. EXAM: ABDOMEN - 1 VIEW COMPARISON:  Abdominal and pelvic CT scan of October 10, 2017. FINDINGS: There is a large hiatal hernia-partially intrathoracic stomach. Here the esophagogastric tube tip and proximal port are found. There are loops of moderately distended gas-filled small bowel stacked in the upper and mid abdomen. IMPRESSION: The esophagogastric tube tip and proximal port lie within the large hiatal hernia-partially intrathoracic stomach. Small bowel ileus or mid to distal partial small bowel obstruction. Electronically Signed   By: David  [place] M.D.   On: 10/10/2017 14:40   Dg Chest Port 1 View  Result Date: 10/10/2017 CLINICAL DATA:  Nausea, vomiting. EXAM: PORTABLE CHEST 1 VIEW COMPARISON:  Radiograph of May 18, 2017. FINDINGS: Stable cardiomegaly. Atherosclerosis of thoracic aorta is noted. No pneumothorax or pleural effusion is noted. Stable hiatal hernia is noted. Stable bibasilar scarring is noted. No acute abnormality is noted. Bony thorax is unremarkable. IMPRESSION: Aortic atherosclerosis. Stable  hiatal hernia. Stable bibasilar scarring. Electronically Signed   By: Marijo Conception, M.D.   On: 10/10/2017 08:48    Procedures Procedures (including critical care time)  Medications Ordered in ED Medications  iopamidol (ISOVUE-300) 61 % injection 30 mL (not administered)  iopamidol (  ISOVUE-300) 61 % injection (not administered)  aspirin chewable tablet 81 mg (81 mg Oral Given 10/11/17 1719)  ipratropium-albuterol (DUONEB) 0.5-2.5 (3) MG/3ML nebulizer solution 3 mL (not administered)  lamoTRIgine (LAMICTAL) tablet 100 mg (100 mg Oral Given 10/11/17 1020)  enoxaparin (LOVENOX) injection 40 mg (40 mg Subcutaneous Given 10/11/17 1719)  ondansetron (ZOFRAN) tablet 4 mg (not administered)    Or  ondansetron (ZOFRAN) injection 4 mg (not administered)  morphine 2 MG/ML injection 2 mg (not administered)  0.9 %  sodium chloride infusion ( Intravenous Rate/Dose Verify 10/11/17 0200)  hydrALAZINE (APRESOLINE) injection 10 mg (10 mg Intravenous Given 10/11/17 0720)  lacosamide (VIMPAT) 50 mg in sodium chloride 0.9 % 25 mL IVPB (0 mg Intravenous Duplicate 10/19/95 9892)  lacosamide (VIMPAT) 200 mg in sodium chloride 0.9 % 25 mL IVPB (not administered)  pantoprazole (PROTONIX) injection 40 mg (40 mg Intravenous Given 10/11/17 1330)  collagenase (SANTYL) ointment ( Topical Given 10/11/17 1607)  sodium chloride 0.9 % bolus 500 mL (0 mLs Intravenous Stopped 10/10/17 1030)  0.9 %  sodium chloride infusion ( Intravenous New Bag/Given 10/10/17 0817)  pantoprazole (PROTONIX) injection 40 mg (40 mg Intravenous Given 10/10/17 1151)  ondansetron (ZOFRAN) injection 4 mg (4 mg Intravenous Given 10/10/17 1151)  lidocaine (XYLOCAINE) 2 % viscous mouth solution 15 mL (15 mLs Mouth/Throat Given 10/10/17 1342)  potassium chloride 10 mEq in 100 mL IVPB (0 mEq Intravenous Stopped 10/11/17 1119)  diatrizoate meglumine-sodium (GASTROGRAFIN) 66-10 % solution 90 mL (90 mLs Per NG tube Given 10/11/17 1153)     Initial Impression /  Assessment and Plan / ED Course  I have reviewed the triage vital signs and the nursing notes.  Pertinent labs & imaging results that were available during my care of the patient were reviewed by me and considered in my medical decision making (see chart for details).     Consultation: [facility] hospitalist for admission. Consultation: General surgery for evaluation.  Final Clinical Impressions(s) / ED Diagnoses   Final diagnoses:  Small bowel obstruction (HCC)  Non-intractable vomiting without nausea, unspecified vomiting type  Patient has had incremental development of vomiting over the past day, possibly somewhat less responsive than baseline.  Patient is denying any pain.  CT scan suggests hiatal hernia and possible small bowel obstruction.  Plan for admission to medical service with consultation to general surgery.  ED Discharge Orders    None       Charlesetta Shanks, MD 10/11/17 1725

## 2017-10-10 NOTE — ED Notes (Signed)
PT will not answer questions. He is not able to be coached to answer questions. Pt did say that he needed to be sick, " you can catch it if you want." Pt vomited x2. Color was a medium brown and a watery consistency. Unable to determine how many times pt vomited or how much. Pt did not react when palpated his stomach, it is soft in nature and not distended.

## 2017-10-10 NOTE — Consult Note (Signed)
Reason for Consult: SBO Chief complaint: Vomiting 1 day Referring Physician: Charlesetta Shanks PCP: [name], MD  Barry Dean is an 84 y.o. male.   HPI: Patient is a 84 year old male with history of dementia, hypertension, proximal atrial fibrillation, tremors, last seizure disorder, who is now in assisted living for dementia. He presents with a history of nausea and vomiting.  He was brought to the hospital by EMS.  There was a 1 day history of nausea vomiting.  Is extremely hard of hearing and has a level of dementia which question a very difficult.  When he was seen by Dr. he said he felt pretty good, but his speech was difficult to understand.  Workup in the ED showed tachycardic on admission low-grade fever 99.1.  He became more hypertensive after some time in the ED. lab shows electrolytes are okay.  Glucose is 1 5, LFTs are normal.  WBC is 13.2, hemoglobin 14.3, 41 6.  Platelets are 275.  Urinalysis was unremarkable.  A single view chest x-ray shows aortic atherosclerosis, stable hiatal hernia and stable bibasilar scarring.  CT of the abdomen shows a large hiatal hernia with intrathoracic stomach dilated with an air-fluid level, dilated proximal loops of small bowel terminating in a mild angulation which could simply be from adhesions.  The distal small bowel did not appear to be dilated there are reticulonodular opacities in both lung bases from chronic atypical infectious process.  Aortic atherosclerosis and emphysema was also noted.  He is being admitted by the medicine service and we are asked small bowel obstruction.  An NG tube placement has been requested by the ED department.  Past Medical History:  Diagnosis Date  . Abnormality of gait   . Anxiety   . Arthritis   . Atrial fibrillation ([place])   . BPH (benign prostatic hyperplasia)   . Deafness   . Dehydration   . Depression   . GERD (gastroesophageal reflux disease)   . Hypertension   . Melanoma ([place])   . Mitral  regurgitation   . Reflux   . Seizure ([place])   . Seizures ([place])   . Syncope   . Tremor 09/27/2015  . Tremor, essential     Past Surgical History:  Procedure Laterality Date  . APPENDECTOMY    . CATARACT EXTRACTION W/ INTRAOCULAR LENS  IMPLANT, BILATERAL    . ESOPHAGOGASTRODUODENOSCOPY (EGD) WITH PROPOFOL N/A 09/15/2014   Procedure: ESOPHAGOGASTRODUODENOSCOPY (EGD) WITH PROPOFOL;  Surgeon: [name], MD;  Location: [facility];  Service: Endoscopy;  Laterality: N/A;  . unknown      Family History  Problem Relation Age of Onset  . Cancer Mother        Skin cancer  . Stroke Father   . Parkinsonism Sister   . Diabetes Brother     Social History:  reports that  has never smoked. he has never used smokeless tobacco. He reports that he does not drink alcohol or use drugs.  Allergies:  Allergies  Allergen Reactions  . Topamax Other (See Comments)    Reaction:  Unknown   . Uroxatral [Alfuzosin Hydrochloride] Other (See Comments)    Reaction:  Unknown   . Valium Other (See Comments)    Reaction:  Unknown     Prior to Admission medications   Medication Sig Start Date End Date Taking? Authorizing Provider  amLODipine (NORVASC) 5 MG tablet Take 1 tablet (5 mg total) by mouth daily. 12/23/16  Yes Orson Eva, MD  aspirin 81 MG chewable  tablet Chew 1 tablet (81 mg total) by mouth every evening. 09/22/14  Yes Thurnell Lose, MD  ferrous sulfate 325 (65 FE) MG tablet Take 1 tablet (325 mg total) by mouth 2 (two) times daily with a meal. 12/22/16  Yes Tat, David, MD  ipratropium-albuterol (DUONEB) 0.5-2.5 (3) MG/3ML SOLN Take 3 mLs by nebulization every 6 (six) hours as needed. Patient taking differently: Take 3 mLs by nebulization every 6 (six) hours as needed (for shortness of breath).  03/22/16  Yes Eugenie Filler, MD  Lacosamide (VIMPAT) 100 MG TABS Take 200 mg by mouth every evening.   Yes [provider]  lacosamide (VIMPAT) 50 MG TABS tablet Take 1 tablet in the AM  (50 mg) and 2 tablets in the evening Patient taking differently: Take 50 mg by mouth every morning. Take 1 tablet in the AM (50 mg). 06/14/17  Yes Kathrynn Ducking, MD  lamoTRIgine (LAMICTAL) 100 MG tablet Take 1 tablet (100 mg total) by mouth 2 (two) times daily. 12/13/16  Yes Kathrynn Ducking, MD  loperamide (IMODIUM) 2 MG capsule Take by mouth as needed for diarrhea or loose stools.   Yes [provider]  mineral oil external liquid Place 3 drops into both ears every Friday.    Yes [provider]  Multiple Vitamin (MULTIVITAMIN) capsule Take 1 capsule by mouth daily.   Yes [provider]  ondansetron (ZOFRAN) 4 MG tablet Take 4 mg by mouth every 8 (eight) hours as needed for nausea or vomiting.   Yes [provider]  pantoprazole (PROTONIX) 20 MG tablet Take 20 mg by mouth daily.   Yes [provider]  polyethylene glycol (MIRALAX) packet Take 17 g by mouth daily. 12/22/16  Yes Tat, Shanon Brow, MD  sennosides-docusate sodium (SENOKOT-S) 8.6-50 MG tablet Take 1 tablet by mouth at bedtime as needed for constipation.    Yes [provider]  vitamin B-12 (CYANOCOBALAMIN) 1000 MCG tablet Take 2,000 mcg by mouth daily.   Yes [provider]  feeding supplement, ENSURE ENLIVE, (ENSURE ENLIVE) LIQD Take 237 mLs by mouth daily. 03/22/16   Eugenie Filler, MD  OXYGEN Inhale 2 L into the lungs See admin instructions. Use 2L continuous at bedtime. Use 2L as needed for shortness of breath.    [provider]     Results for orders placed or performed during the hospital encounter of 10/10/17 (from the past 48 hour(s))  Lipase, blood     Status: None   Collection Time: 10/10/17  6:22 AM  Result Value Ref Range   Lipase 26 11 - 51 U/L    Comment: Performed at [name], [place] [address], [place], [place] [address]  Comprehensive metabolic panel     Status: Abnormal   Collection Time: 10/10/17  6:22 AM  Result Value  Ref Range   Sodium 138 135 - 145 mmol/L   Potassium 3.8 3.5 - 5.1 mmol/L   Chloride 95 (L) 101 - 111 mmol/L   CO2 29 22 - 32 mmol/L   Glucose, Bld 155 (H) 65 - 99 mg/dL   BUN 29 (H) 6 - 20 mg/dL   Creatinine, Ser 0.89 0.61 - 1.24 mg/dL   Calcium 9.4 8.9 - 10.3 mg/dL   Total Protein 7.9 6.5 - 8.1 g/dL   Albumin 4.4 3.5 - 5.0 g/dL   AST 31 15 - 41 U/L   ALT 21 17 - 63 U/L   Alkaline Phosphatase 92 38 - 126  U/L   Total Bilirubin 0.8 0.3 - 1.2 mg/dL   GFR calc non Af Amer >60 >60 mL/min   GFR calc Af Amer >60 >60 mL/min    Comment: (NOTE) The eGFR has been calculated using the CKD EPI equation. This calculation has not been validated in all clinical situations. eGFR's persistently <60 mL/min signify possible Chronic Kidney Disease.    Anion gap 14 5 - 15    Comment: Performed at [facility], [place] [address] [place], [place] [address]  CBC     Status: Abnormal   Collection Time: 10/10/17  6:22 AM  Result Value Ref Range   WBC 13.2 (H) 4.0 - 10.5 K/uL   RBC 4.45 4.22 - 5.81 MIL/uL   Hemoglobin 14.3 13.0 - 17.0 g/dL   HCT 41.6 39.0 - 52.0 %   MCV 93.5 78.0 - 100.0 fL   MCH 32.1 26.0 - 34.0 pg   MCHC 34.4 30.0 - 36.0 g/dL   RDW 13.8 11.5 - 15.5 %   Platelets 276 150 - 400 K/uL    Comment: Performed at [facility], [place] [address] [place], [place] [address]  Magnesium     Status: None   Collection Time: 10/10/17  8:13 AM  Result Value Ref Range   Magnesium 2.3 1.7 - 2.4 mg/dL    Comment: Performed at [facility], [place] [address], [place], [place] [address]  I-stat troponin, ED     Status: None   Collection Time: 10/10/17  8:18 AM  Result Value Ref Range   Troponin i, poc 0.02 0.00 - 0.08 ng/mL   Comment 3            Comment: Due to the release kinetics of cTnI, a negative result within the first hours of the onset of symptoms does not rule out myocardial infarction with certainty. If myocardial infarction is still  suspected, repeat the test at appropriate intervals.   I-Stat CG4 Lactic Acid, ED     Status: None   Collection Time: 10/10/17  8:27 AM  Result Value Ref Range   Lactic Acid, Venous 1.78 0.5 - 1.9 mmol/L  Urinalysis, Routine w reflex microscopic     Status: Abnormal   Collection Time: 10/10/17  9:23 AM  Result Value Ref Range   Color, Urine YELLOW YELLOW   APPearance CLEAR CLEAR   Specific Gravity, Urine 1.026 1.005 - 1.030   pH 5.0 5.0 - 8.0   Glucose, UA NEGATIVE NEGATIVE mg/dL   Hgb urine dipstick NEGATIVE NEGATIVE   Bilirubin Urine NEGATIVE NEGATIVE   Ketones, ur 5 (A) NEGATIVE mg/dL   Protein, ur 100 (A) NEGATIVE mg/dL   Nitrite NEGATIVE NEGATIVE   Leukocytes, UA NEGATIVE NEGATIVE   RBC / HPF 0-5 0 - 5 RBC/hpf   WBC, UA 0-5 0 - 5 WBC/hpf   Bacteria, UA RARE (A) NONE SEEN   Squamous Epithelial / LPF 0-5 (A) NONE SEEN   Mucus PRESENT    Hyaline Casts, UA PRESENT     Comment: Performed at [facility], [place] [address], [place],  [address]    Ct Abdomen Pelvis Wo Contrast  Result Date: 10/10/2017 CLINICAL DATA:  Abdominal distention and vomiting EXAM: CT ABDOMEN AND PELVIS WITHOUT CONTRAST TECHNIQUE: Multidetector CT imaging of the abdomen and pelvis was performed following the standard protocol without IV contrast. COMPARISON:  12/19/2016 FINDINGS: Despite efforts by the technologist and patient, motion artifact is present on today's exam and could  not be eliminated. This reduces exam sensitivity and specificity. Lower chest: Reticulonodular interstitial opacities at both lung bases potentially from atypical infectious process. Calcified reticulonodular opacities in the right middle lobe, unchanged from prior. Passive atelectasis associated with the large hiatal hernia which contains most of the stomach. The intrathoracic portion of the stomach is distended and contains an air-fluid level. Centrilobular emphysema. Coronary, aortic arch, and branch vessel  atherosclerotic vascular disease. The prior trace left pleural effusion has resolved. Hepatobiliary: Unremarkable Pancreas: Indistinct pancreas due to atrophy and motion artifact, no gross abnormality observed. Spleen: Unremarkable Adrenals/Urinary Tract: Unremarkable Stomach/Bowel: There is stool throughout the colon. Loops of dilated proximal small bowel up to 4.1 cm are noted with internal air-fluid levels. This includes the duodenum. In the mid small bowel there is a transition from fluid-filled mildly dilated loops to nondilated small bowel in the left abdomen, with some slight angulation of the loop which could reflect a low-grade adhesion. Vascular/Lymphatic: Aortoiliac atherosclerotic vascular disease. No pathologic adenopathy. Reproductive: Unremarkable Other: Trace amount of free pelvic fluid. Musculoskeletal: Levoconvex lumbar scoliosis with rotary component. Healed deformity in the left humeral shaft. 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 Large hiatal hernia with intrathoracic portion of the stomach dilated with an air-fluid level, and with dilated proximal loops of small bowel terminating in a mild angulation which could conceivably be from an adhesion. The small bowel distal to this point does not appear particularly dilated. The transition is shown on images 55-74 of series 5. 2. 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 3. Aortic Atherosclerosis (ICD10-I70.0) and Emphysema (ICD10-J43.9). Coronary atherosclerosis. 4. Trace amount of free pelvic fluid. 5. 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 Levoconvex lumbar scoliosis with rotary component. Electronically Signed   By: Van Clines M.D.   On: 10/10/2017 10:32   Dg Chest Port 1 View  Result Date: 10/10/2017 CLINICAL DATA:   Nausea, vomiting. EXAM: PORTABLE CHEST 1 VIEW COMPARISON:  Radiograph of May 18, 2017. FINDINGS: Stable cardiomegaly. Atherosclerosis of thoracic aorta is noted. No pneumothorax or pleural effusion is noted. Stable hiatal hernia is noted. Stable bibasilar scarring is noted. No acute abnormality is noted. Bony thorax is unremarkable. IMPRESSION: Aortic atherosclerosis. Stable hiatal hernia. Stable bibasilar scarring. Electronically Signed   By: Marijo Conception, M.D.   On: 10/10/2017 08:48    Review of Systems  Unable to perform ROS: Dementia   Blood pressure (!) 178/96, pulse (!) 105, temperature 99.1 F (37.3 C), temperature source Oral, resp. rate (!) 21, SpO2 (!) 81 %. Physical Exam  Constitutional: No distress.  Elderly male with NG in place, confused and not making a good deal of sense.  He does know he is in the hospital.  HENT:  Head: Normocephalic and atraumatic.  Mouth/Throat: No oropharyngeal exudate.  Eyes: Right eye exhibits no discharge. Left eye exhibits no discharge. No scleral icterus.  Pupils are equal  Neck: Normal range of motion. Neck supple. No JVD present. No tracheal deviation present. No thyromegaly present.  Cardiovascular: Normal rate, regular rhythm, normal heart sounds and intact distal pulses.  No murmur heard. Respiratory: Effort normal. No respiratory distress. He has no wheezes. He has no rales. He exhibits no tenderness.  O2 sats are down but I think it's the monitor, his fingers and hands are cold.  GI: Soft. He exhibits distension. He exhibits  no mass. There is no tenderness. There is no rebound and no guarding.  Musculoskeletal: He exhibits no edema, tenderness or deformity.  Lymphadenopathy:    He has no cervical adenopathy.  Neurological: He is alert. No cranial nerve deficit.  Skin: Skin is warm and dry. No rash noted. He is not diaphoretic. No erythema. No pallor.  Psychiatric: He has a normal mood and affect. His behavior is normal. Judgment  and thought content normal.    Assessment/Plan: SBO Hiatal hernia with dilatation of stomach, a large portion which is in the chest History of seizures Extremely hard of hearing Atrial fibrillation History of multiple falls. Hard of hearing Hypertension History of mitral regurgitation   Plan:  NG in place, decompress tonight, fluid hydration per Medicine and we will check film in AM.  ? SBP tomorrow.    Barry Dean 10/10/2017, 1:19 PM

## 2017-10-10 NOTE — H&P (Signed)
History and Physical    Barry Dean DOB: 01/26/1924 DOA: 10/10/2017  PCP: [name], MD   Patient coming from: Assisted living facility   Chief Complaint: Nausea vomiting  HPI: Barry Dean is a 84 y.o. male with medical history significant of hypertension, paroxysmal atrial fibrillation not on anticoagulation, gelastic seizure disorder, diastolic heart failure COPD who comes in with 1 day of nausea vomiting.  Unable to obtain history as patient is extremely hard of hearing and quite confused.  This is apparently his baseline.  Per review of the chart patient had 1 day of nausea vomiting at his assisted living facility.  He did not have any diarrhea.  He did not have any chest pain, abdominal pain, cough, congestion.  ED Course: In the ED vitals were notable for mild tachycardia to 105.  Sats were 81% after an episode of emesis and patient was put on 3 L nasal cannula.  Labs count of 13.2 but were otherwise unremarkable.  CT of the abdomen pelvis showed large intrathoracic gastric hernia along with dilation of the proximal small bowel and possible lead point concerning for small bowel obstruction.  General surgery was consulted and NG tube was placed with 600 cc removed.  Review of Systems: As per HPI otherwise 10 point review of systems negative.     Past Medical History:  Diagnosis Date  . Abnormality of gait   . Anxiety   . Arthritis   . Atrial fibrillation ([place])   . BPH (benign prostatic hyperplasia)   . Deafness   . Dehydration   . Depression   . GERD (gastroesophageal reflux disease)   . Hypertension   . Melanoma ([place])   . Mitral regurgitation   . Reflux   . Seizure ([place])   . Seizures ([place])   . Syncope   . Tremor 09/27/2015  . Tremor, essential     Past Surgical History:  Procedure Laterality Date  . APPENDECTOMY    . CATARACT EXTRACTION W/ INTRAOCULAR LENS  IMPLANT, BILATERAL    . ESOPHAGOGASTRODUODENOSCOPY (EGD) WITH PROPOFOL N/A 09/15/2014     Procedure: ESOPHAGOGASTRODUODENOSCOPY (EGD) WITH PROPOFOL;  Surgeon: [name], MD;  Location: [name] ENDOSCOPY;  Service: Endoscopy;  Laterality: N/A;  . unknown       reports that  has never smoked. he has never used smokeless tobacco. He reports that he does not drink alcohol or use drugs.  Allergies  Allergen Reactions  . Topamax Other (See Comments)    Reaction:  Unknown   . Uroxatral [Alfuzosin Hydrochloride] Other (See Comments)    Reaction:  Unknown   . Valium Other (See Comments)    Reaction:  Unknown     Family History  Problem Relation Age of Onset  . Cancer Mother        Skin cancer  . Stroke Father   . Parkinsonism Sister   . Diabetes Brother    Unacceptable: Noncontributory, unremarkable, or negative. Acceptable: Family history reviewed and not pertinent (If you reviewed it)  Prior to Admission medications   Medication Sig Start Date End Date Taking? Authorizing Provider  amLODipine (NORVASC) 5 MG tablet Take 1 tablet (5 mg total) by mouth daily. 12/23/16  Yes TatShanon Brow, MD  aspirin 81 MG chewable tablet Chew 1 tablet (81 mg total) by mouth every evening. 09/22/14  Yes Thurnell Lose, MD  ferrous sulfate 325 (65 FE) MG tablet Take 1 tablet (325 mg total) by mouth 2 (two) times daily with a  meal. 12/22/16  Yes Tat, Shanon Brow, MD  ipratropium-albuterol (DUONEB) 0.5-2.5 (3) MG/3ML SOLN Take 3 mLs by nebulization every 6 (six) hours as needed. Patient taking differently: Take 3 mLs by nebulization every 6 (six) hours as needed (for shortness of breath).  03/22/16  Yes Eugenie Filler, MD  Lacosamide (VIMPAT) 100 MG TABS Take 200 mg by mouth every evening.   Yes [provider]  lacosamide (VIMPAT) 50 MG TABS tablet Take 1 tablet in the AM (50 mg) and 2 tablets in the evening Patient taking differently: Take 50 mg by mouth every morning. Take 1 tablet in the AM (50 mg). 06/14/17  Yes Kathrynn Ducking, MD  lamoTRIgine (LAMICTAL) 100 MG tablet Take 1 tablet  (100 mg total) by mouth 2 (two) times daily. 12/13/16  Yes Kathrynn Ducking, MD  loperamide (IMODIUM) 2 MG capsule Take by mouth as needed for diarrhea or loose stools.   Yes [provider]  mineral oil external liquid Place 3 drops into both ears every Friday.    Yes [provider]  Multiple Vitamin (MULTIVITAMIN) capsule Take 1 capsule by mouth daily.   Yes [provider]  ondansetron (ZOFRAN) 4 MG tablet Take 4 mg by mouth every 8 (eight) hours as needed for nausea or vomiting.   Yes [provider]  pantoprazole (PROTONIX) 20 MG tablet Take 20 mg by mouth daily.   Yes [provider]  polyethylene glycol (MIRALAX) packet Take 17 g by mouth daily. 12/22/16  Yes Tat, Shanon Brow, MD  sennosides-docusate sodium (SENOKOT-S) 8.6-50 MG tablet Take 1 tablet by mouth at bedtime as needed for constipation.    Yes [provider]  vitamin B-12 (CYANOCOBALAMIN) 1000 MCG tablet Take 2,000 mcg by mouth daily.   Yes [provider]  feeding supplement, ENSURE ENLIVE, (ENSURE ENLIVE) LIQD Take 237 mLs by mouth daily. 03/22/16   Eugenie Filler, MD  OXYGEN Inhale 2 L into the lungs See admin instructions. Use 2L continuous at bedtime. Use 2L as needed for shortness of breath.    [provider]    Physical Exam: Vitals:   10/10/17 0930 10/10/17 1230 10/10/17 1245 10/10/17 1300  BP: (!) 171/88 (!) 180/90 (!) 170/94 (!) 178/96  Pulse:  100 (!) 105   Resp: 13 20 (!) 22 (!) 21  Temp:      TempSrc:      SpO2:  (!) 86% (!) 81%     Constitutional: NAD, calm, comfortable Vitals:   10/10/17 0930 10/10/17 1230 10/10/17 1245 10/10/17 1300  BP: (!) 171/88 (!) 180/90 (!) 170/94 (!) 178/96  Pulse:  100 (!) 105   Resp: 13 20 (!) 22 (!) 21  Temp:      TempSrc:      SpO2:  (!) 86% (!) 81%    Eyes: PERRL, lids and conjunctivae normal ENMT: Mucous membranes are moist. Posterior pharynx clear of any exudate or lesions.Normal dentition.  Neck:  normal, supple, no masses, no thyromegaly Respiratory: clear to auscultation bilaterally, no wheezing, no crackles. Normal respiratory effort. No accessory muscle use.  Cardiovascular: Regular rate and rhythm, no murmurs / rubs / gallops. No extremity edema. 2+ pedal pulses. No carotid bruits.  Abdomen: no tenderness, no masses palpated. No hepatosplenomegaly. Bowel sounds positive.  Musculoskeletal: no clubbing / cyanosis. No joint deformity upper and lower extremities. Good ROM, no contractures. Normal muscle tone.  Skin: no rashes, lesions, ulcers. No induration Neurologic: CN 2-12 grossly intact. Sensation intact, DTR normal. Strength  5/5 in all 4.  Psychiatric: Normal judgment and insight. Alert and oriented x 3. Normal mood.    Labs on Admission: I have personally reviewed following labs and imaging studies  CBC: Recent Labs  Lab 10/10/17 0622  WBC 13.2*  HGB 14.3  HCT 41.6  MCV 93.5  PLT 878   Basic Metabolic Panel: Recent Labs  Lab 10/10/17 0622 10/10/17 0813  NA 138  --   K 3.8  --   CL 95*  --   CO2 29  --   GLUCOSE 155*  --   BUN 29*  --   CREATININE 0.89  --   CALCIUM 9.4  --   MG  --  2.3   GFR: CrCl cannot be calculated (Unknown ideal weight.). Liver Function Tests: Recent Labs  Lab 10/10/17 0622  AST 31  ALT 21  ALKPHOS 92  BILITOT 0.8  PROT 7.9  ALBUMIN 4.4   Recent Labs  Lab 10/10/17 0622  LIPASE 26   No results for input(s): AMMONIA in the last 168 hours. Coagulation Profile: No results for input(s): INR, PROTIME in the last 168 hours. Cardiac Enzymes: No results for input(s): CKTOTAL, CKMB, CKMBINDEX, TROPONINI in the last 168 hours. BNP (last 3 results) No results for input(s): PROBNP in the last 8760 hours. HbA1C: No results for input(s): HGBA1C in the last 72 hours. CBG: No results for input(s): GLUCAP in the last 168 hours. Lipid Profile: No results for input(s): CHOL, HDL, LDLCALC, TRIG, CHOLHDL, LDLDIRECT in the last 72  hours. Thyroid Function Tests: No results for input(s): TSH, T4TOTAL, FREET4, T3FREE, THYROIDAB in the last 72 hours. Anemia Panel: No results for input(s): VITAMINB12, FOLATE, FERRITIN, TIBC, IRON, RETICCTPCT in the last 72 hours. Urine analysis:    Component Value Date/Time   COLORURINE YELLOW 10/10/2017 0923   APPEARANCEUR CLEAR 10/10/2017 0923   LABSPEC 1.026 10/10/2017 0923   PHURINE 5.0 10/10/2017 0923   GLUCOSEU NEGATIVE 10/10/2017 0923   HGBUR NEGATIVE 10/10/2017 0923   BILIRUBINUR NEGATIVE 10/10/2017 0923   KETONESUR 5 (A) 10/10/2017 0923   PROTEINUR 100 (A) 10/10/2017 0923   UROBILINOGEN 1.0 11/12/2014 1132   NITRITE NEGATIVE 10/10/2017 0923   LEUKOCYTESUR NEGATIVE 10/10/2017 0923    Radiological Exams on Admission: Ct Abdomen Pelvis Wo Contrast  Result Date: 10/10/2017 CLINICAL DATA:  Abdominal distention and vomiting EXAM: CT ABDOMEN AND PELVIS WITHOUT CONTRAST TECHNIQUE: Multidetector CT imaging of the abdomen and pelvis was performed following the standard protocol without IV contrast. COMPARISON:  12/19/2016 FINDINGS: Despite efforts by the technologist and patient, motion artifact is present on today's exam and could not be eliminated. This reduces exam sensitivity and specificity. Lower chest: Reticulonodular interstitial opacities at both lung bases potentially from atypical infectious process. Calcified reticulonodular opacities in the right middle lobe, unchanged from prior. Passive atelectasis associated with the large hiatal hernia which contains most of the stomach. The intrathoracic portion of the stomach is distended and contains an air-fluid level. Centrilobular emphysema. Coronary, aortic arch, and branch vessel atherosclerotic vascular disease. The prior trace left pleural effusion has resolved. Hepatobiliary: Unremarkable Pancreas: Indistinct pancreas due to atrophy and motion artifact, no gross abnormality observed. Spleen: Unremarkable Adrenals/Urinary Tract:  Unremarkable Stomach/Bowel: There is stool throughout the colon. Loops of dilated proximal small bowel up to 4.1 cm are noted with internal air-fluid levels. This includes the duodenum. In the mid small bowel there is a transition from fluid-filled mildly dilated loops to nondilated small bowel in the left abdomen, with some  slight angulation of the loop which could reflect a low-grade adhesion. Vascular/Lymphatic: Aortoiliac atherosclerotic vascular disease. No pathologic adenopathy. Reproductive: Unremarkable Other: Trace amount of free pelvic fluid. Musculoskeletal: Levoconvex lumbar scoliosis with rotary component. Healed deformity in the left humeral shaft. 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 Large hiatal hernia with intrathoracic portion of the stomach dilated with an air-fluid level, and with dilated proximal loops of small bowel terminating in a mild angulation which could conceivably be from an adhesion. The small bowel distal to this point does not appear particularly dilated. The transition is shown on images 55-74 of series 5. 2. 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 3. Aortic Atherosclerosis (ICD10-I70.0) and Emphysema (ICD10-J43.9). Coronary atherosclerosis. 4. Trace amount of free pelvic fluid. 5. 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 Levoconvex lumbar scoliosis with rotary component. Electronically Signed   By: Van Clines M.D.   On: 10/10/2017 10:32   Dg Chest Port 1 View  Result Date: 10/10/2017 CLINICAL DATA:  Nausea, vomiting. EXAM: PORTABLE CHEST 1 VIEW COMPARISON:  Radiograph of May 18, 2017. FINDINGS: Stable cardiomegaly. Atherosclerosis of thoracic aorta is noted. No pneumothorax or pleural effusion is noted. Stable hiatal hernia is noted. Stable  bibasilar scarring is noted. No acute abnormality is noted. Bony thorax is unremarkable. IMPRESSION: Aortic atherosclerosis. Stable hiatal hernia. Stable bibasilar scarring. Electronically Signed   By: Marijo Conception, M.D.   On: 10/10/2017 08:48    EKG: Independently reviewed.  Sinus tachycardia, no acute ST segment changes  Assessment/Plan Active Problems:   Seizure disorder (HCC)   HTN (hypertension)   Benign essential HTN   Parkinson's disease (HCC)   PAF (paroxysmal atrial fibrillation) (HCC)    ##) Small bowel obstruction: Unclear etiology at this time. -Continue NG tube to low intermittent suction -IV fluids, IV morphine for pain control, IV ondansetron for nausea vomiting -Supple electrolytes -General surgery consulted and is following appreciate recommendations  ##) Seizure disorder: -Continue lacosamide 200 mg nightly -Continue lamotrigine 100 mg twice a day -We will continue these orally, if patient cannot tolerate will transition to IV  ##) Paroxysmal atrial fibrillation: Not a good candidate for anticoagulation due to seizure disorder and age -Continue aspirin 81 mg  ##) Hypertension: -Continue amlodipine 5 mg   Fluids: IV fluids Electrodes: Monitor and supplement Nutrition: N.p.o.  Prophylaxis: Enoxaparin  Disposition: Pending resolution of small bowel obstruction  Full code  Cristy Folks MD [facility]  If 7PM-7AM, please contact night-coverage [email or web address] Password [facility]  10/10/2017, 1:44 PM

## 2017-10-10 NOTE — ED Notes (Signed)
NG tube placed with 666ml output

## 2017-10-10 NOTE — ED Triage Notes (Signed)
Patient arrives by [facility] from [facility] with complaints of vomiting x 1 day.

## 2017-10-10 NOTE — ED Notes (Signed)
Patient O2sat is 81% on RA after small amount of emesis. Lung sounds clear in all lobes. Started on 3L with increase to 97%

## 2017-10-11 ENCOUNTER — Inpatient Hospital Stay (HOSPITAL_COMMUNITY): Payer: Medicare Other

## 2017-10-11 DIAGNOSIS — I10 Essential (primary) hypertension: Secondary | ICD-10-CM

## 2017-10-11 DIAGNOSIS — I480 Paroxysmal atrial fibrillation: Secondary | ICD-10-CM

## 2017-10-11 DIAGNOSIS — G40909 Epilepsy, unspecified, not intractable, without status epilepticus: Secondary | ICD-10-CM

## 2017-10-11 LAB — BASIC METABOLIC PANEL
Anion gap: 10 (ref 5–15)
BUN: 24 mg/dL — ABNORMAL HIGH (ref 6–20)
CO2: 28 mmol/L (ref 22–32)
Calcium: 8.3 mg/dL — ABNORMAL LOW (ref 8.9–10.3)
GFR calc non Af Amer: >60 mL/min (ref >60)
Glucose, Bld: 90 mg/dL (ref 65–99)

## 2017-10-11 LAB — CBC
HCT: 35.3 % — ABNORMAL LOW (ref 39.0–52.0)
Hemoglobin: 12.2 g/dL — ABNORMAL LOW (ref 13.0–17.0)
MCH: 32.4 pg (ref 26.0–34.0)
MCHC: 34.6 g/dL (ref 30.0–36.0)
MCV: 93.6 fL (ref 78.0–100.0)
Platelets: 213 K/uL (ref 150–400)
RBC: 3.77 MIL/uL — ABNORMAL LOW (ref 4.22–5.81)
RDW: 14.1 % (ref 11.5–15.5)
WBC: 10.1 K/uL (ref 4.0–10.5)

## 2017-10-11 LAB — BASIC METABOLIC PANEL WITH GFR
Chloride: 101 mmol/L (ref 101–111)
Creatinine, Ser: 0.68 mg/dL (ref 0.61–1.24)
GFR calc Af Amer: >60 mL/min (ref >60)
Potassium: 3.2 mmol/L — ABNORMAL LOW (ref 3.5–5.1)
Sodium: 139 mmol/L (ref 135–145)

## 2017-10-11 LAB — URINE CULTURE: Culture: NO GROWTH

## 2017-10-11 LAB — MRSA PCR SCREENING: MRSA by PCR: NEGATIVE

## 2017-10-11 MED ORDER — DIATRIZOATE MEGLUMINE & SODIUM 66-10 % PO SOLN
90.0000 mL | Freq: Once | ORAL | Status: AC
  Administered 2017-10-11: 90 mL via NASOGASTRIC
  Filled 2017-10-11: qty 90

## 2017-10-11 MED ORDER — COLLAGENASE 250 UNIT/GM EX OINT
TOPICAL_OINTMENT | Freq: Every day | CUTANEOUS | Status: DC
  Administered 2017-10-11 – 2017-10-13 (×3): via TOPICAL
  Filled 2017-10-11: qty 90

## 2017-10-11 MED ORDER — SODIUM CHLORIDE 0.9 % IV SOLN
200.0000 mg | Freq: Every evening | INTRAVENOUS | Status: DC
  Administered 2017-10-11 – 2017-10-12 (×2): 200 mg via INTRAVENOUS
  Filled 2017-10-11 (×4): qty 20

## 2017-10-11 MED ORDER — PANTOPRAZOLE SODIUM 40 MG IV SOLR
40.0000 mg | INTRAVENOUS | Status: DC
  Administered 2017-10-11 – 2017-10-12 (×2): 40 mg via INTRAVENOUS
  Filled 2017-10-11 (×2): qty 40

## 2017-10-11 MED ORDER — LACOSAMIDE 200 MG/20ML IV SOLN
50.0000 mg | Freq: Every morning | INTRAVENOUS | Status: DC
  Administered 2017-10-12 – 2017-10-13 (×2): 50 mg via INTRAVENOUS
  Filled 2017-10-11 (×3): qty 5

## 2017-10-11 MED ORDER — POTASSIUM CHLORIDE 10 MEQ/100ML IV SOLN
10.0000 meq | INTRAVENOUS | Status: AC
  Administered 2017-10-11 (×3): 10 meq via INTRAVENOUS
  Filled 2017-10-11 (×3): qty 100

## 2017-10-11 MED ORDER — HYDRALAZINE HCL 20 MG/ML IJ SOLN
10.0000 mg | INTRAMUSCULAR | Status: DC | PRN
  Administered 2017-10-11: 10 mg via INTRAVENOUS
  Filled 2017-10-11: qty 1

## 2017-10-11 NOTE — Consult Note (Signed)
[place] Nurse wound consult note Reason for Consult: Unstageable pressure injury to the left lateral malleolus Wound type: pressure Pressure Injury POA: Yes Measurement:2cm x 1.5cm with depth obscured by the presence of necrotic tissue Wound bed:see above Drainage (amount, consistency, odor) none Periwound:intact with mild erythema Dressing procedure/placement/frequency: I will implement a conservative POC using collagenase (Santyl) as an enzymatic debriding agent once daily for 3 weeks and place feet into pressure redistribution heel boots. Saline dressings once daily would be an appropriate topical to be used in follow up once the initial tube of collagenase is used and upon the patient's return to [facility].  If you agree, please order.  [place] nursing team will not follow, but will remain available to this patient, the nursing and medical teams.  Please re-consult if needed. Thanks, Maudie Flakes, MSN, RN, [place], Arther Abbott  Pager# [phone]

## 2017-10-11 NOTE — Progress Notes (Signed)
Initial Nutrition Assessment  DOCUMENTATION CODES:   Not applicable  INTERVENTION:    Monitor for diet advancement/toleration  Boost Breeze po TID, each supplement provides 250 kcal and 9 grams of protein once advanced  NUTRITION DIAGNOSIS:   Inadequate oral intake related to altered GI function, vomiting, nausea as evidenced by per patient/family report.  GOAL:   Patient will meet greater than or equal to 90% of their needs  MONITOR:   PO intake, Supplement acceptance, Diet advancement, Weight trends, Labs, I & O's  REASON FOR ASSESSMENT:   Malnutrition Screening Tool    ASSESSMENT:   Pt with PMH significant for HTN, gelastic seizure disorder, CHF, and COPD. Presents this admission with complaints of new onset nausea/vomiting for 1 day. Admitted for small bowel obstruction from unclear etiology. NGT to suction in place at this time.    RD observed 200 ml of dark green output from NGT. Pt sleeping upon assessment. Spoke with son at bedside. Son reports he lives in [place] and visits pt every 6-8 weeks. They have a sister that lives in [place] that plans to come to the hospital later today. Son states pt had decreased intake for two days PTA. Before these two days pt was severed three meals at SNF and typically finished 75% at each meal. Pt was offered Boost and greek yogurt daily. Son unsure if he consumed them.   Son unaware of any wt changes. RD obtained bed reading of 177 lb. Pt noted to weigh 180 lb in April 2018 (not significant for time frame).   Nutrition-Focused physical exam completed. Pt shows to have moderate muscle depletion in bilateral lower extremities. Suspect this is related to immobility as pt is wheel chair bound.   Medications reviewed.  Labs reviewed: K 3.2 (L)   NUTRITION - FOCUSED PHYSICAL EXAM:    Most Recent Value  Orbital Region  No depletion  Upper Arm Region  No depletion  Thoracic and Lumbar Region  Unable to assess  Buccal Region  Mild  depletion  Temple Region  Moderate depletion  Clavicle Bone Region  Moderate depletion  Clavicle and Acromion Bone Region  Moderate depletion  Scapular Bone Region  Unable to assess  Dorsal Hand  No depletion  Patellar Region  Moderate depletion  Anterior Thigh Region  Moderate depletion  Posterior Calf Region  Moderate depletion  Edema (RD Assessment)  None     Diet Order:  Diet NPO time specified Except for: Sips with Meds  EDUCATION NEEDS:   Not appropriate for education at this time  Skin:  Skin Assessment: Skin Integrity Issues: Skin Integrity Issues:: Other (Comment) Other: left ankle pressure injury  Last BM:  PTA  Height:   Ht Readings from Last 1 Encounters:  10/01/17 5\' 10"  (1.778 m)    Weight:   Wt Readings from Last 1 Encounters:  10/11/17 177 lb 14.6 oz (80.7 kg)    Ideal Body Weight:  75.5 kg  BMI:  Body mass index is 25.53 kg/m.  Estimated Nutritional Needs:   Kcal:  2000-2200 kcal/day  Protein:  100-110 g/day  Fluid:  >2 L/day    Mariana Single RD, LDN Clinical Nutrition Pager # [phone]

## 2017-10-11 NOTE — Progress Notes (Signed)
[facility] Progress Note     Subjective: CC-  Patient does not have hearing aids and is very hard of hearing. Family at bedside.  Denies any current pain. No flatus or BM.  Abdominal xray not yet done this morning.  Objective: Vital signs in last 24 hours: Temp:  [97.6 F (36.4 C)-100.2 F (37.9 C)] 97.7 F (36.5 C) (02/15 0621) Pulse Rate:  [76-105] 89 (02/15 0621) Resp:  [16-22] 16 (02/15 0621) BP: (119-180)/(63-96) 175/91 (02/15 0621) SpO2:  [81 %-99 %] 97 % (02/15 0621) Weight:  [80.7 kg (177 lb 14.6 oz)] 80.7 kg (177 lb 14.6 oz) (02/15 1015)    Intake/Output from previous day: 02/14 0701 - 02/15 0700 In: 1025 [I.V.:525; IV Piggyback:500] Out: 750 [Emesis/NG output:750] Intake/Output this shift: Total I/O In: -  Out: 150 [Emesis/NG output:150]  PE: Gen:  Alert, NAD HEENT: EOM's intact, pupils equal and round Card:  RRR, no M/G/R heard Pulm:  effort normal Abd: Soft, mild distension, few BS heard, no HSM, nontender Ext:  No erythema, edema, or tenderness BUE/BLE  Skin: no rashes noted, warm and dry  Lab Results:  Recent Labs    10/10/17 0622 10/11/17 0454  WBC 13.2* 10.1  HGB 14.3 12.2*  HCT 41.6 35.3*  PLT 276 213   BMET Recent Labs    10/10/17 0622 10/11/17 0454  NA 138 139  K 3.8 3.2*  CL 95* 101  CO2 29 28  GLUCOSE 155* 90  BUN 29* 24*  CREATININE 0.89 0.68  CALCIUM 9.4 8.3*   PT/INR No results for input(s): LABPROT, INR in the last 72 hours. CMP     Component Value Date/Time   NA 139 10/11/2017 0454   NA 139 10/01/2017 0956   K 3.2 (L) 10/11/2017 0454   CL 101 10/11/2017 0454   CO2 28 10/11/2017 0454   GLUCOSE 90 10/11/2017 0454   BUN 24 (H) 10/11/2017 0454   BUN 13 10/01/2017 0956   CREATININE 0.68 10/11/2017 0454   CALCIUM 8.3 (L) 10/11/2017 0454   PROT 7.9 10/10/2017 0622   PROT 6.4 10/01/2017 0956   ALBUMIN 4.4 10/10/2017 0622   ALBUMIN 3.9 10/01/2017 0956   AST 31 10/10/2017 0622   ALT 21 10/10/2017 0622    ALKPHOS 92 10/10/2017 0622   BILITOT 0.8 10/10/2017 0622   BILITOT 0.2 10/01/2017 0956   GFRNONAA >60 10/11/2017 0454   GFRAA >60 10/11/2017 0454   Lipase     Component Value Date/Time   LIPASE 26 10/10/2017 0622       Studies/Results: Ct Abdomen Pelvis Wo Contrast  Result Date: 10/10/2017 CLINICAL DATA:  Abdominal distention and vomiting EXAM: CT ABDOMEN AND PELVIS WITHOUT CONTRAST TECHNIQUE: Multidetector CT imaging of the abdomen and pelvis was performed following the standard protocol without IV contrast. COMPARISON:  12/19/2016 FINDINGS: Despite efforts by the technologist and patient, motion artifact is present on today's exam and could not be eliminated. This reduces exam sensitivity and specificity. Lower chest: Reticulonodular interstitial opacities at both lung bases potentially from atypical infectious process. Calcified reticulonodular opacities in the right middle lobe, unchanged from prior. Passive atelectasis associated with the large hiatal hernia which contains most of the stomach. The intrathoracic portion of the stomach is distended and contains an air-fluid level. Centrilobular emphysema. Coronary, aortic arch, and branch vessel atherosclerotic vascular disease. The prior trace left pleural effusion has resolved. Hepatobiliary: Unremarkable Pancreas: Indistinct pancreas due to atrophy and motion artifact, no gross abnormality observed. Spleen: Unremarkable Adrenals/Urinary Tract:  Unremarkable Stomach/Bowel: There is stool throughout the colon. Loops of dilated proximal small bowel up to 4.1 cm are noted with internal air-fluid levels. This includes the duodenum. In the mid small bowel there is a transition from fluid-filled mildly dilated loops to nondilated small bowel in the left abdomen, with some slight angulation of the loop which could reflect a low-grade adhesion. Vascular/Lymphatic: Aortoiliac atherosclerotic vascular disease. No pathologic adenopathy. Reproductive:  Unremarkable Other: Trace amount of free pelvic fluid. Musculoskeletal: Levoconvex lumbar scoliosis with rotary component. Healed deformity in the left humeral shaft. 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 Large hiatal hernia with intrathoracic portion of the stomach dilated with an air-fluid level, and with dilated proximal loops of small bowel terminating in a mild angulation which could conceivably be from an adhesion. The small bowel distal to this point does not appear particularly dilated. The transition is shown on images 55-74 of series 5. 2. 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 3. Aortic Atherosclerosis (ICD10-I70.0) and Emphysema (ICD10-J43.9). Coronary atherosclerosis. 4. Trace amount of free pelvic fluid. 5. 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 Levoconvex lumbar scoliosis with rotary component. Electronically Signed   By: Van Clines M.D.   On: 10/10/2017 10:32   Dg Abdomen 1 View  Result Date: 10/10/2017 CLINICAL DATA:  Status post nasogastric tube placement. EXAM: ABDOMEN - 1 VIEW COMPARISON:  Abdominal and pelvic CT scan of October 10, 2017. FINDINGS: There is a large hiatal hernia-partially intrathoracic stomach. Here the esophagogastric tube tip and proximal port are found. There are loops of moderately distended gas-filled small bowel stacked in the upper and mid abdomen. IMPRESSION: The esophagogastric tube tip and proximal port lie within the large hiatal hernia-partially intrathoracic stomach. Small bowel ileus or mid to distal partial small bowel obstruction. Electronically Signed   By: David  Martinique M.D.   On: 10/10/2017 14:40   Dg Chest Port 1 View  Result Date: 10/10/2017 CLINICAL DATA:  Nausea, vomiting. EXAM: PORTABLE CHEST 1 VIEW COMPARISON:   Radiograph of May 18, 2017. FINDINGS: Stable cardiomegaly. Atherosclerosis of thoracic aorta is noted. No pneumothorax or pleural effusion is noted. Stable hiatal hernia is noted. Stable bibasilar scarring is noted. No acute abnormality is noted. Bony thorax is unremarkable. IMPRESSION: Aortic atherosclerosis. Stable hiatal hernia. Stable bibasilar scarring. Electronically Signed   By: Marijo Conception, M.D.   On: 10/10/2017 08:48    Anti-infectives: Anti-infectives (From admission, onward)   None       Assessment/Plan History of seizures Extremely hard of hearing Atrial fibrillation History of multiple falls. Hard of hearing Hypertension History of mitral regurgitation  Hiatal hernia with dilatation of stomach, a large portion which is in the chest SBO - CT scan 2/14 showed large hiatal hernia with intrathoracic portion of the stomach dilated with an air-fluid level, and with dilated proximal loops of small bowel terminating in a mild angulation which could conceivably be from an adhesion - NG tube with 900c output since placement - 1 BM documented but no one can confirm; not passing flatus this morning  ID - none FEN - IVF, NPO/NGT VTE - lovenox Follow up - TBD  Plan:  No bowel function. Continue NPO/NGT. Potassium being replaced. Will start small bowel protocol today.   LOS: 1 day    Wellington Hampshire , [facility] 10/11/2017, 10:45  AM Pager: [phone] Consults: [phone] Mon-Fri 7:00 am-4:30 pm Sat-Sun 7:00 am-11:30 am

## 2017-10-11 NOTE — Progress Notes (Signed)
Paged MD about patient BP 175/91 pulse 89. Received orders for hydralazine prn.

## 2017-10-11 NOTE — Progress Notes (Signed)
Left foot placed in Pravalon boot. Donne Hazel, RN

## 2017-10-11 NOTE — Progress Notes (Signed)
PROGRESS NOTE        PATIENT DETAILS Name: Barry Dean Age: 84 y.o. Sex: male Date of Birth: April 16, 1924 Admit Date: 10/10/2017 Admitting Physician Cristy Folks, MD BUL:[id number], Mallie Mussel, MD  Brief Narrative: Patient is a 84 y.o. male history of seizure disorder, depression, dementia, hypertension who presented to the ED for evaluation of abdominal pain, nausea and vomiting, was found to have small bowel obstruction admitted to the hospitalist service.  See below for further details  Subjective: No BM or flatus.  Assessment/Plan: Small bowel obstruction: Continue supportive care with NG tube, n.p.o. status.  No BM or flatus yet.  General surgery following and directing care.  Hypokalemia: Replete and recheck  Seizure disorder: Continue with Vimpat-we will change to IV-remains on oral Lamictal-RN to give if possible NG tube in place.  Hypertension: Amlodipine on hold-blood pressure on the higher side-continue as needed IV hydralazine.  If blood pressure remains persistently elevated,, could consider starting scheduled IV antihypertensives.  Paroxysmal atrial fibrillation: Rate controlled-due to advanced age, frailty, fall risk not a candidate for anticoagulation.  On aspirin in the outpatient setting.  Chronic diastolic heart failure: Compensated-monitor volume status closely while on IV fluids.   GERD:PPI  DVT Prophylaxis: Prophylactic Lovenox   Code Status: DNR-confirmed to son in law at bedside  Family Communication:  Son in Sports coach at bedside  Disposition Plan: Remain inpatient-back to SNF on discharge.   Antimicrobial agents: Anti-infectives (From admission, onward)   None     Procedures: None  CONSULTS:  general surgery  Time spent: 25- minutes-Greater than 50% of this time was spent in counseling, explanation of diagnosis, planning of further management, and coordination of care.  MEDICATIONS: Scheduled Meds: . aspirin  81 mg  Oral QPM  . diatrizoate meglumine-sodium  90 mL Per NG tube Once  . enoxaparin (LOVENOX) injection  40 mg Subcutaneous Q24H  . lacosamide  200 mg Oral QPM  . lacosamide  50 mg Oral Daily  . lamoTRIgine  100 mg Oral BID   Continuous Infusions: . sodium chloride 75 mL/hr at 10/11/17 0200   PRN Meds:.hydrALAZINE, iopamidol, ipratropium-albuterol, morphine injection, ondansetron **OR** ondansetron (ZOFRAN) IV   PHYSICAL EXAM: Vital signs: Vitals:   10/10/17 2147 10/11/17 0143 10/11/17 0621 10/11/17 1015  BP: 119/63 (!) 143/86 (!) 175/91   Pulse: 76 81 89   Resp: 16 17 16    Temp: 100.2 F (37.9 C) 97.6 F (36.4 C) 97.7 F (36.5 C)   TempSrc: Axillary Axillary Oral   SpO2: 99% 97% 97%   Weight:    80.7 kg (177 lb 14.6 oz)   Filed Weights   10/11/17 1015  Weight: 80.7 kg (177 lb 14.6 oz)   Body mass index is 25.53 kg/m.   General appearance :Awake, somewhat alert, not in any distress. Frail and chronically sick appearing. NGT in place HEENT: Atraumatic and Normocephalic Neck: supple, no JVD. No cervical lymphadenopathy.  Resp:Good air entry bilaterally, no added sounds  CVS: S1 S2 regular, no murmurs.  GI: Bowel sounds not present, Non tender and not distended with no gaurding, rigidity or rebound.No organomegaly Extremities: B/L Lower Ext shows no edema, both legs are warm to touch Neurology:  speech clear,Non focal, sensation is grossly intact. Psychiatric: Normal judgment and insight. Alert and oriented x 3. Normal mood. Musculoskeletal:No digital cyanosis Skin:No Rash, warm and dry Wounds:N/A  I have personally reviewed following labs and imaging studies  LABORATORY DATA: CBC: Recent Labs  Lab 10/10/17 0622 10/11/17 0454  WBC 13.2* 10.1  HGB 14.3 12.2*  HCT 41.6 35.3*  MCV 93.5 93.6  PLT 276 086    Basic Metabolic Panel: Recent Labs  Lab 10/10/17 0622 10/10/17 0813 10/11/17 0454  NA 138  --  139  K 3.8  --  3.2*  CL 95*  --  101  CO2 29  --  28    GLUCOSE 155*  --  90  BUN 29*  --  24*  CREATININE 0.89  --  0.68  CALCIUM 9.4  --  8.3*  MG  --  2.3  --     GFR: Estimated Creatinine Clearance: 62.1 mL/min (by C-G formula based on SCr of 0.68 mg/dL).  Liver Function Tests: Recent Labs  Lab 10/10/17 0622  AST 31  ALT 21  ALKPHOS 92  BILITOT 0.8  PROT 7.9  ALBUMIN 4.4   Recent Labs  Lab 10/10/17 0622  LIPASE 26   No results for input(s): AMMONIA in the last 168 hours.  Coagulation Profile: No results for input(s): INR, PROTIME in the last 168 hours.  Cardiac Enzymes: No results for input(s): CKTOTAL, CKMB, CKMBINDEX, TROPONINI in the last 168 hours.  BNP (last 3 results) No results for input(s): PROBNP in the last 8760 hours.  HbA1C: No results for input(s): HGBA1C in the last 72 hours.  CBG: No results for input(s): GLUCAP in the last 168 hours.  Lipid Profile: No results for input(s): CHOL, HDL, LDLCALC, TRIG, CHOLHDL, LDLDIRECT in the last 72 hours.  Thyroid Function Tests: No results for input(s): TSH, T4TOTAL, FREET4, T3FREE, THYROIDAB in the last 72 hours.  Anemia Panel: No results for input(s): VITAMINB12, FOLATE, FERRITIN, TIBC, IRON, RETICCTPCT in the last 72 hours.  Urine analysis:    Component Value Date/Time   COLORURINE YELLOW 10/10/2017 0923   APPEARANCEUR CLEAR 10/10/2017 0923   LABSPEC 1.026 10/10/2017 0923   PHURINE 5.0 10/10/2017 0923   GLUCOSEU NEGATIVE 10/10/2017 0923   HGBUR NEGATIVE 10/10/2017 0923   BILIRUBINUR NEGATIVE 10/10/2017 0923   KETONESUR 5 (A) 10/10/2017 0923   PROTEINUR 100 (A) 10/10/2017 0923   UROBILINOGEN 1.0 11/12/2014 1132   NITRITE NEGATIVE 10/10/2017 0923   LEUKOCYTESUR NEGATIVE 10/10/2017 0923    Sepsis Labs: Lactic Acid, Venous    Component Value Date/Time   LATICACIDVEN 1.78 10/10/2017 0827    MICROBIOLOGY: Recent Results (from the past 240 hour(s))  Urine culture     Status: None   Collection Time: 10/10/17  9:23 AM  Result Value Ref Range  Status   Specimen Description   Final    URINE, CATHETERIZED Performed at [facility], [place] [address] [place], [place] [address]    Special Requests   Final    NONE Performed at [name], [place] [address], [place], [place] [address]    Culture   Final    NO GROWTH Performed at [facility], [place] [address] [place], [place] [address]    Report Status 10/11/2017 FINAL  Final    RADIOLOGY STUDIES/RESULTS: Ct Abdomen Pelvis Wo Contrast  Result Date: 10/10/2017 CLINICAL DATA:  Abdominal distention and vomiting EXAM: CT ABDOMEN AND PELVIS WITHOUT CONTRAST TECHNIQUE: Multidetector CT imaging of the abdomen and pelvis was performed following the standard protocol without IV contrast. COMPARISON:  12/19/2016 FINDINGS: Despite efforts by the technologist and patient, motion artifact is present on  today's exam and could not be eliminated. This reduces exam sensitivity and specificity. Lower chest: Reticulonodular interstitial opacities at both lung bases potentially from atypical infectious process. Calcified reticulonodular opacities in the right middle lobe, unchanged from prior. Passive atelectasis associated with the large hiatal hernia which contains most of the stomach. The intrathoracic portion of the stomach is distended and contains an air-fluid level. Centrilobular emphysema. Coronary, aortic arch, and branch vessel atherosclerotic vascular disease. The prior trace left pleural effusion has resolved. Hepatobiliary: Unremarkable Pancreas: Indistinct pancreas due to atrophy and motion artifact, no gross abnormality observed. Spleen: Unremarkable Adrenals/Urinary Tract: Unremarkable Stomach/Bowel: There is stool throughout the colon. Loops of dilated proximal small bowel up to 4.1 cm are noted with internal air-fluid levels. This includes the duodenum. In the mid small bowel there is a transition from fluid-filled mildly dilated loops to nondilated small  bowel in the left abdomen, with some slight angulation of the loop which could reflect a low-grade adhesion. Vascular/Lymphatic: Aortoiliac atherosclerotic vascular disease. No pathologic adenopathy. Reproductive: Unremarkable Other: Trace amount of free pelvic fluid. Musculoskeletal: Levoconvex lumbar scoliosis with rotary component. Healed deformity in the left humeral shaft. 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 Large hiatal hernia with intrathoracic portion of the stomach dilated with an air-fluid level, and with dilated proximal loops of small bowel terminating in a mild angulation which could conceivably be from an adhesion. The small bowel distal to this point does not appear particularly dilated. The transition is shown on images 55-74 of series 5. 2. 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 3. Aortic Atherosclerosis (ICD10-I70.0) and Emphysema (ICD10-J43.9). Coronary atherosclerosis. 4. Trace amount of free pelvic fluid. 5. 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 Levoconvex lumbar scoliosis with rotary component. Electronically Signed   By: Van Clines M.D.   On: 10/10/2017 10:32   Dg Abdomen 1 View  Result Date: 10/10/2017 CLINICAL DATA:  Status post nasogastric tube placement. EXAM: ABDOMEN - 1 VIEW COMPARISON:  Abdominal and pelvic CT scan of October 10, 2017. FINDINGS: There is a large hiatal hernia-partially intrathoracic stomach. Here the esophagogastric tube tip and proximal port are found. There are loops of moderately distended gas-filled small bowel stacked in the upper and mid abdomen. IMPRESSION: The esophagogastric tube tip and proximal port lie within the large hiatal hernia-partially intrathoracic stomach. Small bowel ileus or mid to distal partial small bowel  obstruction. Electronically Signed   By: David  Martinique M.D.   On: 10/10/2017 14:40   Dg Chest Port 1 View  Result Date: 10/10/2017 CLINICAL DATA:  Nausea, vomiting. EXAM: PORTABLE CHEST 1 VIEW COMPARISON:  Radiograph of May 18, 2017. FINDINGS: Stable cardiomegaly. Atherosclerosis of thoracic aorta is noted. No pneumothorax or pleural effusion is noted. Stable hiatal hernia is noted. Stable bibasilar scarring is noted. No acute abnormality is noted. Bony thorax is unremarkable. IMPRESSION: Aortic atherosclerosis. Stable hiatal hernia. Stable bibasilar scarring. Electronically Signed   By: Marijo Conception, M.D.   On: 10/10/2017 08:48     LOS: 1 day   Oren Binet, MD  [facility] Pager:[phone]  If 7PM-7AM, please contact night-coverage [email or web address] Password [facility] 10/11/2017, 11:42 AM

## 2017-10-12 ENCOUNTER — Inpatient Hospital Stay (HOSPITAL_COMMUNITY): Payer: Medicare Other

## 2017-10-12 DIAGNOSIS — K56609 Unspecified intestinal obstruction, unspecified as to partial versus complete obstruction: Secondary | ICD-10-CM

## 2017-10-12 LAB — CBC
HCT: 35.1 % — ABNORMAL LOW (ref 39.0–52.0)
Hemoglobin: 11.5 g/dL — ABNORMAL LOW (ref 13.0–17.0)
MCH: 31.2 pg (ref 26.0–34.0)
MCHC: 32.8 g/dL (ref 30.0–36.0)
MCV: 95.1 fL (ref 78.0–100.0)
PLATELETS: 212 10*3/uL (ref 150–400)
RBC: 3.69 MIL/uL — AB (ref 4.22–5.81)
RDW: 13.7 % (ref 11.5–15.5)
WBC: 7.8 10*3/uL (ref 4.0–10.5)

## 2017-10-12 LAB — BASIC METABOLIC PANEL
ANION GAP: 9 (ref 5–15)
BUN: 21 mg/dL — AB (ref 6–20)
CHLORIDE: 104 mmol/L (ref 101–111)
CO2: 26 mmol/L (ref 22–32)
Calcium: 8.6 mg/dL — ABNORMAL LOW (ref 8.9–10.3)
Creatinine, Ser: 0.63 mg/dL (ref 0.61–1.24)
GFR calc Af Amer: >60 mL/min (ref >60)
GLUCOSE: 65 mg/dL (ref 65–99)
POTASSIUM: 3.1 mmol/L — AB (ref 3.5–5.1)
Sodium: 139 mmol/L (ref 135–145)

## 2017-10-12 LAB — MAGNESIUM: Magnesium: 2 mg/dL (ref 1.7–2.4)

## 2017-10-12 MED ORDER — LIP MEDEX EX OINT
TOPICAL_OINTMENT | CUTANEOUS | Status: AC
  Administered 2017-10-12: 06:00:00
  Filled 2017-10-12: qty 7

## 2017-10-12 MED ORDER — POTASSIUM CHLORIDE IN NACL 40-0.9 MEQ/L-% IV SOLN
INTRAVENOUS | Status: DC
  Administered 2017-10-12 (×2): 100 mL/h via INTRAVENOUS
  Filled 2017-10-12 (×3): qty 1000

## 2017-10-12 NOTE — Progress Notes (Signed)
PROGRESS NOTE        PATIENT DETAILS Name: Barry Dean Age: 84 y.o. Sex: male Date of Birth: 11/07/1923 Admit Date: 10/10/2017 Admitting Physician Cristy Folks, MD XNA:[id number], Mallie Mussel, MD  Brief Narrative: Patient is a 84 y.o. male history of seizure disorder, depression, dementia, hypertension who presented to the ED for evaluation of abdominal pain, nausea and vomiting, was found to have small bowel obstruction admitted to the hospitalist service.  See below for further details  Subjective:   Patient in bed, appears comfortable, denies any headache, no fever, no chest pain or pressure, no shortness of breath , no abdominal pain. No focal weakness.   Assessment/Plan:  Small bowel obstruction: treated conservatively, general surgery on board, Contrast within the colon, x-ray suspicious for stool impaction in the rectum, will try disimpaction along with tap water enema.  Clears per general surgery.  Monitor.  Hypokalemia: Place will monitor along with magnesium levels.  Seizure disorder: On combination of IV Vimpat and Lamictal through NG.     Hypertension: Amlodipine on hold-blood pressure on the higher side-continue as needed IV hydralazine.  If blood pressure remains persistently elevated,, could consider starting scheduled IV antihypertensives.  Paroxysmal atrial fibrillation: Rate controlled-due to advanced age, frailty, fall risk not a candidate for anticoagulation.  On aspirin in the outpatient setting.  Chronic diastolic heart failure: Compensated-monitor volume status closely while on IV fluids.   GERD:PPI    DVT Prophylaxis: Prophylactic Lovenox   Code Status: DNR-confirmed to son in law at bedside  Family Communication:  Son in Sports coach at bedside  Disposition Plan: Remain inpatient-back to SNF on discharge.   Antimicrobial agents: Anti-infectives (From admission, onward)   None     Procedures: None  CONSULTS:  general  surgery  Time spent: 25- minutes-Greater than 50% of this time was spent in counseling, explanation of diagnosis, planning of further management, and coordination of care.  MEDICATIONS: Scheduled Meds: . aspirin  81 mg Oral QPM  . collagenase   Topical Daily  . enoxaparin (LOVENOX) injection  40 mg Subcutaneous Q24H  . lamoTRIgine  100 mg Oral BID  . pantoprazole (PROTONIX) IV  40 mg Intravenous Q24H   Continuous Infusions: . 0.9 % NaCl with KCl 40 mEq / L 100 mL/hr (10/12/17 0755)  . lacosamide (VIMPAT) IV 200 mg (10/11/17 1818)  . lacosamide (VIMPAT) IV 50 mg (10/12/17 0941)   PRN Meds:.hydrALAZINE, iopamidol, ipratropium-albuterol, morphine injection, ondansetron **OR** ondansetron (ZOFRAN) IV   PHYSICAL EXAM: Vital signs: Vitals:   10/11/17 1015 10/11/17 1339 10/11/17 2100 10/12/17 0500  BP:  (!) 151/70 (!) 168/90 (!) 155/64  Pulse:  85 76 63  Resp:  16 16 16   Temp:  98.6 F (37 C) 97.9 F (36.6 C) 97.7 F (36.5 C)  TempSrc:  Oral Oral Oral  SpO2:  97% 97% 98%  Weight: 80.7 kg (177 lb 14.6 oz)      Filed Weights   10/11/17 1015  Weight: 80.7 kg (177 lb 14.6 oz)   Body mass index is 25.53 kg/m.   Physical Exam  Awake ,  No new F.N deficits, Normal affect [place].AT,PERRAL,   Supple Neck,No JVD, No cervical lymphadenopathy appriciated.  Symmetrical Chest wall movement, Good air movement bilaterally, CTAB RRR,No Gallops, Rubs or new Murmurs, No Parasternal Heave +ve B.Sounds, Abd Soft, No tenderness, No organomegaly appriciated, No rebound -  guarding or rigidity. No Cyanosis, Clubbing or edema, No new Rash or bruise Minimally verbal, NG tube placed,  I have personally reviewed following labs and imaging studies  LABORATORY DATA: CBC: Recent Labs  Lab 10/10/17 0622 10/11/17 0454 10/12/17 0440  WBC 13.2* 10.1 7.8  HGB 14.3 12.2* 11.5*  HCT 41.6 35.3* 35.1*  MCV 93.5 93.6 95.1  PLT 276 213 858    Basic Metabolic Panel: Recent Labs  Lab 10/10/17 0622  10/10/17 0813 10/11/17 0454 10/12/17 0440  NA 138  --  139 139  K 3.8  --  3.2* 3.1*  CL 95*  --  101 104  CO2 29  --  28 26  GLUCOSE 155*  --  90 65  BUN 29*  --  24* 21*  CREATININE 0.89  --  0.68 0.63  CALCIUM 9.4  --  8.3* 8.6*  MG  --  2.3  --  2.0    GFR: Estimated Creatinine Clearance: 62.1 mL/min (by C-G formula based on SCr of 0.63 mg/dL).  Liver Function Tests: Recent Labs  Lab 10/10/17 0622  AST 31  ALT 21  ALKPHOS 92  BILITOT 0.8  PROT 7.9  ALBUMIN 4.4   Recent Labs  Lab 10/10/17 0622  LIPASE 26   No results for input(s): AMMONIA in the last 168 hours.  Coagulation Profile: No results for input(s): INR, PROTIME in the last 168 hours.  Cardiac Enzymes: No results for input(s): CKTOTAL, CKMB, CKMBINDEX, TROPONINI in the last 168 hours.  BNP (last 3 results) No results for input(s): PROBNP in the last 8760 hours.  HbA1C: No results for input(s): HGBA1C in the last 72 hours.  CBG: No results for input(s): GLUCAP in the last 168 hours.  Lipid Profile: No results for input(s): CHOL, HDL, LDLCALC, TRIG, CHOLHDL, LDLDIRECT in the last 72 hours.  Thyroid Function Tests: No results for input(s): TSH, T4TOTAL, FREET4, T3FREE, THYROIDAB in the last 72 hours.  Anemia Panel: No results for input(s): VITAMINB12, FOLATE, FERRITIN, TIBC, IRON, RETICCTPCT in the last 72 hours.  Urine analysis:    Component Value Date/Time   COLORURINE YELLOW 10/10/2017 0923   APPEARANCEUR CLEAR 10/10/2017 0923   LABSPEC 1.026 10/10/2017 0923   PHURINE 5.0 10/10/2017 0923   GLUCOSEU NEGATIVE 10/10/2017 0923   HGBUR NEGATIVE 10/10/2017 0923   BILIRUBINUR NEGATIVE 10/10/2017 0923   KETONESUR 5 (A) 10/10/2017 0923   PROTEINUR 100 (A) 10/10/2017 0923   UROBILINOGEN 1.0 11/12/2014 1132   NITRITE NEGATIVE 10/10/2017 0923   LEUKOCYTESUR NEGATIVE 10/10/2017 0923    Sepsis Labs: Lactic Acid, Venous    Component Value Date/Time   LATICACIDVEN 1.78 10/10/2017 0827     MICROBIOLOGY: Recent Results (from the past 240 hour(s))  Urine culture     Status: None   Collection Time: 10/10/17  9:23 AM  Result Value Ref Range Status   Specimen Description   Final    URINE, CATHETERIZED Performed at [facility], [place] [address], [place], [place] [address]    Special Requests   Final    NONE Performed at [facility], [place] [address], [place], [place] [address]    Culture   Final    NO GROWTH Performed at [facility], [place] [address], [place], [place] [address]    Report Status 10/11/2017 FINAL  Final  MRSA PCR Screening     Status: None   Collection Time: 10/11/17  7:42 AM  Result Value Ref Range Status  MRSA by PCR NEGATIVE NEGATIVE Final    Comment:        The GeneXpert MRSA Assay (FDA approved for NASAL specimens only), is one component of a comprehensive MRSA colonization surveillance program. It is not intended to diagnose MRSA infection nor to guide or monitor treatment for MRSA infections. Performed at [facility], [place] [address], [place], [place] [address]     RADIOLOGY STUDIES/RESULTS: Ct Abdomen Pelvis Wo Contrast  Result Date: 10/10/2017 CLINICAL DATA:  Abdominal distention and vomiting EXAM: CT ABDOMEN AND PELVIS WITHOUT CONTRAST TECHNIQUE: Multidetector CT imaging of the abdomen and pelvis was performed following the standard protocol without IV contrast. COMPARISON:  12/19/2016 FINDINGS: Despite efforts by the technologist and patient, motion artifact is present on today's exam and could not be eliminated. This reduces exam sensitivity and specificity. Lower chest: Reticulonodular interstitial opacities at both lung bases potentially from atypical infectious process. Calcified reticulonodular opacities in the right middle lobe, unchanged from prior. Passive atelectasis associated with the large hiatal hernia which contains most of the stomach. The intrathoracic portion  of the stomach is distended and contains an air-fluid level. Centrilobular emphysema. Coronary, aortic arch, and branch vessel atherosclerotic vascular disease. The prior trace left pleural effusion has resolved. Hepatobiliary: Unremarkable Pancreas: Indistinct pancreas due to atrophy and motion artifact, no gross abnormality observed. Spleen: Unremarkable Adrenals/Urinary Tract: Unremarkable Stomach/Bowel: There is stool throughout the colon. Loops of dilated proximal small bowel up to 4.1 cm are noted with internal air-fluid levels. This includes the duodenum. In the mid small bowel there is a transition from fluid-filled mildly dilated loops to nondilated small bowel in the left abdomen, with some slight angulation of the loop which could reflect a low-grade adhesion. Vascular/Lymphatic: Aortoiliac atherosclerotic vascular disease. No pathologic adenopathy. Reproductive: Unremarkable Other: Trace amount of free pelvic fluid. Musculoskeletal: Levoconvex lumbar scoliosis with rotary component. Healed deformity in the left humeral shaft. 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 Large hiatal hernia with intrathoracic portion of the stomach dilated with an air-fluid level, and with dilated proximal loops of small bowel terminating in a mild angulation which could conceivably be from an adhesion. The small bowel distal to this point does not appear particularly dilated. The transition is shown on images 55-74 of series 5. 2. 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 3. Aortic Atherosclerosis (ICD10-I70.0) and Emphysema (ICD10-J43.9). Coronary atherosclerosis. 4. Trace amount of free pelvic fluid. 5. 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 Levoconvex lumbar scoliosis with rotary component. Electronically  Signed   By: Van Clines M.D.   On: 10/10/2017 10:32   Dg Abd 1 View  Result Date: 10/11/2017 CLINICAL DATA:  Follow-up small bowel obstruction EXAM: ABDOMEN - 1 VIEW COMPARISON:  KUB October 10, 2017 FINDINGS: There is a large stool ball in the rectum. Contrast is seen throughout the colon. Air-filled loops of large and small bowel are identified. IMPRESSION: Air-filled loops of large and small bowel are identified. This could represent ileus or partial small bowel obstruction. There is contrast throughout the colon and a large stool ball in the rectum. Electronically Signed   By: Dorise Bullion III M.D   On: 10/11/2017 15:00   Dg Abdomen 1 View  Result Date: 10/10/2017 CLINICAL DATA:  Status post nasogastric tube placement. EXAM: ABDOMEN - 1 VIEW COMPARISON:  Abdominal and pelvic CT  scan of October 10, 2017. FINDINGS: There is a large hiatal hernia-partially intrathoracic stomach. Here the esophagogastric tube tip and proximal port are found. There are loops of moderately distended gas-filled small bowel stacked in the upper and mid abdomen. IMPRESSION: The esophagogastric tube tip and proximal port lie within the large hiatal hernia-partially intrathoracic stomach. Small bowel ileus or mid to distal partial small bowel obstruction. Electronically Signed   By: David  Martinique M.D.   On: 10/10/2017 14:40   Dg Chest Port 1 View  Result Date: 10/10/2017 CLINICAL DATA:  Nausea, vomiting. EXAM: PORTABLE CHEST 1 VIEW COMPARISON:  Radiograph of May 18, 2017. FINDINGS: Stable cardiomegaly. Atherosclerosis of thoracic aorta is noted. No pneumothorax or pleural effusion is noted. Stable hiatal hernia is noted. Stable bibasilar scarring is noted. No acute abnormality is noted. Bony thorax is unremarkable. IMPRESSION: Aortic atherosclerosis. Stable hiatal hernia. Stable bibasilar scarring. Electronically Signed   By: Marijo Conception, M.D.   On: 10/10/2017 08:48   Dg Abd Portable 1v  Result Date:  10/12/2017 CLINICAL DATA:  Small bowel obstruction. EXAM: PORTABLE ABDOMEN - 1 VIEW COMPARISON:  10/11/2017. FINDINGS: Persistent gaseous distension of small bowel. There is oral contrast in the majority of the colon, with large amount of stool in the rectum. Sutures are seen in the right lower quadrant. IMPRESSION: Bowel-gas pattern may be due to an ileus and fecal impaction. Partial small bowel obstruction is not excluded. Electronically Signed   By: Lorin Picket M.D.   On: 10/12/2017 08:28   Dg Abd Portable 1v-small Bowel Obstruction Protocol-initial, 8 Hr Delay  Result Date: 10/11/2017 CLINICAL DATA:  Small bowel obstruction. EXAM: PORTABLE ABDOMEN - 1 VIEW COMPARISON:  10/11/2017 at 1439 hours FINDINGS: The upper abdomen was partially excluded from view. There is a similar appearance of oral contrast throughout the colon as well as of a large amount of stool in the rectum. Moderate gaseous distension of multiple small bowel loops throughout the central abdomen is similar to the prior study. IMPRESSION: No significant interval change. Contrast throughout the colon with large amount of stool in the rectum. Gaseous small bowel distension which could reflect ileus or partial obstruction. Electronically Signed   By: Logan Bores M.D.   On: 10/11/2017 20:19     LOS: 2 days   Lala Lund, MD  [facility] Pager:[phone]  If 7PM-7AM, please contact night-coverage [email or web address] Password [email or web address] 10/12/2017, 11:05 AM

## 2017-10-12 NOTE — Progress Notes (Signed)
Patient turned side to side to prevent skin breakdown.

## 2017-10-12 NOTE — Progress Notes (Signed)
Order written to disimpact pt.  Dr. [name] did a rectal exam this a.m and stated that pt was not impacted.  Nurse only administered tap water enema per order.

## 2017-10-12 NOTE — Clinical Social Work Note (Signed)
Clinical Social Work Assessment  Patient Details  Name: Barry Dean MRN: [id number] Date of Birth: 1924/05/02  Date of referral:  10/12/17               Reason for consult:  Discharge Planning                Permission sought to share information with:  Case Manager, Facility Sport and exercise psychologist, Family Supports Permission granted to share information::  Yes, Verbal Permission Granted  Name::        Agency::  [facility]  Relationship::  daughter Jan at Art therapist Information:     Housing/Transportation Living arrangements for the past 2 months:  [place] of Information:  Medical Team, Adult Children, Facility Patient Interpreter Needed:  None Criminal Activity/Legal Involvement Pertinent to Current Situation/Hospitalization:  No - Comment as needed Significant Relationships:  Adult Children Lives with:  Facility Resident Do you feel safe going back to the place where you live?  Yes Need for family participation in patient care:  Yes (Comment)  Care giving concerns:  Patient arrives from [facility] where he is a long term resident.  Patient has been vomiting for the last couple of days and here for evaluation.  Daughter Jan at bedside. She voices no concerns with current care or care at the facility.  Jan states patient is primarily in the wheelchair, but is able at times to stand and pivot. She reports he has done some therapy in the past, but he just does not progress thus they are not looking to place him in SNF or have therapy.  If needed they are open to therapy consults and there is therapy at the ALF when he returns.  Daughter has no concerns for him to return to ALF at discharge. She reports facility anticipates him returning as well.  Unable to reach facility this Saturday afternoon, however will have CSW follow up for return.   Social Worker assessment / plan:  Assessment completed with  daughter. Patient resting comfortably.  Patient had an NG tube placed this morning, but it was removed. Tele-sitter was in room as patient was trying to pull it out.  No behaviors or trying to get up, only for monitoring the NG tube but this is removed, meaning they will remove tele-sitter.  Patient plans to return to ALF at [place]. [facility] will be updated and facility contact.    Employment status:  Retired Forensic scientist:  Medicare PT Recommendations:  Not assessed at this time Information / Referral to community resources:     Patient/Family's Response to care:  Daughter understanding of plan and in agreement  Patient/Family's Understanding of and Emotional Response to Diagnosis, Current Treatment, and Prognosis:  Daughter voices relief as NG tube was taken out as she was so worried he was going to pull it. Reports they are advancing his diet and monitoring him closely.  Emotional Assessment Appearance:  Appears stated age Attitude/Demeanor/Rapport:    Affect (typically observed):  Calm, Stable Orientation:  Oriented to Self Alcohol / Substance use:  Not Applicable Psych involvement (Current and /or in the community):  No (Comment)  Discharge Needs  Concerns to be addressed:  Denies Needs/Concerns at this time Readmission within the last 30 days:  No Current discharge risk:  None Barriers to Discharge:  Continued Medical Work up   [place], LCSW 10/12/2017, 2:12 PM

## 2017-10-12 NOTE — Progress Notes (Signed)
[facility]:  [phone] General Surgery Progress Note   LOS: 2 days  POD -     Chief Complaint: Bowel obstruction  Assessment and Plan: 1.  SBO  Contrast in colon on KUB yesterday  With large HH - I'm not sure how much NGT is helping.  He is non communicative, which makes it difficult to tell how he is doing.  Will d/c ngt and start clear liquids and see how he does.  2.  Hypokalemic  K+ - 3.1 - 10/12/2017  3.  DVT prophylaxis - Lovenox 4.  Dementia 5.  Hard of hearing 6.  Large hiatal hernia   Principal Problem:   SBO (small bowel obstruction) (HCC) Active Problems:   Seizure disorder (HCC)   HTN (hypertension)   Benign essential HTN   Parkinson's disease (HCC)   PAF (paroxysmal atrial fibrillation) (HCC)   Hiatal hernia   Subjective:  Answers questions, but I cannot understand him.  So he is not able to participate in his care.  He does not look uncomfortable or sick.  Objective:   Vitals:   10/11/17 2100 10/12/17 0500  BP: (!) 168/90 (!) 155/64  Pulse: 76 63  Resp: 16 16  Temp: 97.9 F (36.6 C) 97.7 F (36.5 C)  SpO2: 97% 98%     Intake/Output from previous day:  02/15 0701 - 02/16 0700 In: 0932 [I.V.:1425; IV Piggyback:45] Out: 950 [Urine:650; Emesis/NG output:300]  Intake/Output this shift:  Total I/O In: -  Out: 400 [Urine:400]   Physical Exam:   General: WN older demented WM who is alert.   HEENT: Normal. Pupils equal. .   Lungs: Clear   Abdomen: soft.  Scar in right lower quadrant.  Some firmness to abdomen in right lower quadrant.   Rectal - soft stool in rectum, no evidence of impaction   Lab Results:    Recent Labs    10/11/17 0454 10/12/17 0440  WBC 10.1 7.8  HGB 12.2* 11.5*  HCT 35.3* 35.1*  PLT 213 212    BMET   Recent Labs    10/11/17 0454 10/12/17 0440  NA 139 139  K 3.2* 3.1*  CL 101 104  CO2 28 26  GLUCOSE 90 65  BUN 24* 21*  CREATININE 0.68 0.63  CALCIUM 8.3* 8.6*    PT/INR  No  results for input(s): LABPROT, INR in the last 72 hours.  ABG  No results for input(s): PHART, HCO3 in the last 72 hours.  Invalid input(s): PCO2, PO2   Studies/Results:  Ct Abdomen Pelvis Wo Contrast  Result Date: 10/10/2017 CLINICAL DATA:  Abdominal distention and vomiting EXAM: CT ABDOMEN AND PELVIS WITHOUT CONTRAST TECHNIQUE: Multidetector CT imaging of the abdomen and pelvis was performed following the standard protocol without IV contrast. COMPARISON:  12/19/2016 FINDINGS: Despite efforts by the technologist and patient, motion artifact is present on today's exam and could not be eliminated. This reduces exam sensitivity and specificity. Lower chest: Reticulonodular interstitial opacities at both lung bases potentially from atypical infectious process. Calcified reticulonodular opacities in the right middle lobe, unchanged from prior. Passive atelectasis associated with the large hiatal hernia which contains most of the stomach. The intrathoracic portion of the stomach is distended and contains an air-fluid level. Centrilobular emphysema. Coronary, aortic arch, and branch vessel atherosclerotic vascular disease. The prior trace left pleural effusion has resolved. Hepatobiliary: Unremarkable Pancreas: Indistinct pancreas due to atrophy and motion artifact, no gross abnormality observed. Spleen: Unremarkable Adrenals/Urinary Tract: Unremarkable Stomach/Bowel: There is stool  throughout the colon. Loops of dilated proximal small bowel up to 4.1 cm are noted with internal air-fluid levels. This includes the duodenum. In the mid small bowel there is a transition from fluid-filled mildly dilated loops to nondilated small bowel in the left abdomen, with some slight angulation of the loop which could reflect a low-grade adhesion. Vascular/Lymphatic: Aortoiliac atherosclerotic vascular disease. No pathologic adenopathy. Reproductive: Unremarkable Other: Trace amount of free pelvic fluid. Musculoskeletal:  Levoconvex lumbar scoliosis with rotary component. Healed deformity in the left humeral shaft. 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 Large hiatal hernia with intrathoracic portion of the stomach dilated with an air-fluid level, and with dilated proximal loops of small bowel terminating in a mild angulation which could conceivably be from an adhesion. The small bowel distal to this point does not appear particularly dilated. The transition is shown on images 55-74 of series 5. 2. 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 3. Aortic Atherosclerosis (ICD10-I70.0) and Emphysema (ICD10-J43.9). Coronary atherosclerosis. 4. Trace amount of free pelvic fluid. 5. 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 Levoconvex lumbar scoliosis with rotary component. Electronically Signed   By: Van Clines M.D.   On: 10/10/2017 10:32   Dg Abd 1 View  Result Date: 10/11/2017 CLINICAL DATA:  Follow-up small bowel obstruction EXAM: ABDOMEN - 1 VIEW COMPARISON:  KUB October 10, 2017 FINDINGS: There is a large stool ball in the rectum. Contrast is seen throughout the colon. Air-filled loops of large and small bowel are identified. IMPRESSION: Air-filled loops of large and small bowel are identified. This could represent ileus or partial small bowel obstruction. There is contrast throughout the colon and a large stool ball in the rectum. Electronically Signed   By: Dorise Bullion III M.D   On: 10/11/2017 15:00   Dg Abdomen 1 View  Result Date: 10/10/2017 CLINICAL DATA:  Status post nasogastric tube placement. EXAM: ABDOMEN - 1 VIEW COMPARISON:  Abdominal and pelvic CT scan of October 10, 2017. FINDINGS: There is a large hiatal hernia-partially intrathoracic stomach. Here the esophagogastric tube tip and proximal  port are found. There are loops of moderately distended gas-filled small bowel stacked in the upper and mid abdomen. IMPRESSION: The esophagogastric tube tip and proximal port lie within the large hiatal hernia-partially intrathoracic stomach. Small bowel ileus or mid to distal partial small bowel obstruction. Electronically Signed   By: Cirilo Canner  Martin[place] M.D.   On: 10/10/2017 14:40   Dg Abd Portable 1v  Result Date: 10/12/2017 CLINICAL DATA:  Small bowel obstruction. EXAM: PORTABLE ABDOMEN - 1 VIEW COMPARISON:  10/11/2017. FINDINGS: Persistent gaseous distension of small bowel. There is oral contrast in the majority of the colon, with large amount of stool in the rectum. Sutures are seen in the right lower quadrant. IMPRESSION: Bowel-gas pattern may be due to an ileus and fecal impaction. Partial small bowel obstruction is not excluded. Electronically Signed   By: Lorin Picket M.D.   On: 10/12/2017 08:28   Dg Abd Portable 1v-small Bowel Obstruction Protocol-initial, 8 Hr Delay  Result Date: 10/11/2017 CLINICAL DATA:  Small bowel obstruction. EXAM: PORTABLE ABDOMEN - 1 VIEW COMPARISON:  10/11/2017 at 1439 hours FINDINGS: The upper abdomen was partially excluded from view. There is a similar appearance of oral contrast throughout the colon as well as of a large amount of stool in the rectum.  Moderate gaseous distension of multiple small bowel loops throughout the central abdomen is similar to the prior study. IMPRESSION: No significant interval change. Contrast throughout the colon with large amount of stool in the rectum. Gaseous small bowel distension which could reflect ileus or partial obstruction. Electronically Signed   By: Logan Bores M.D.   On: 10/11/2017 20:19     Anti-infectives:   Anti-infectives (From admission, onward)   None      Alphonsa Overall, MD, FACS Pager: [facility]: [phone] 10/12/2017

## 2017-10-13 LAB — CBC
HEMATOCRIT: 35.2 % — AB (ref 39.0–52.0)
Hemoglobin: 11.8 g/dL — ABNORMAL LOW (ref 13.0–17.0)
MCH: 31.5 pg (ref 26.0–34.0)
MCHC: 33.5 g/dL (ref 30.0–36.0)
MCV: 93.9 fL (ref 78.0–100.0)
Platelets: 209 10*3/uL (ref 150–400)
RBC: 3.75 MIL/uL — ABNORMAL LOW (ref 4.22–5.81)
RDW: 13.5 % (ref 11.5–15.5)
WBC: 7.4 10*3/uL (ref 4.0–10.5)

## 2017-10-13 LAB — BASIC METABOLIC PANEL
Anion gap: 5 (ref 5–15)
BUN: 11 mg/dL (ref 6–20)
CALCIUM: 8.6 mg/dL — AB (ref 8.9–10.3)
CHLORIDE: 104 mmol/L (ref 101–111)
CO2: 27 mmol/L (ref 22–32)
CREATININE: 0.54 mg/dL — AB (ref 0.61–1.24)
GFR calc Af Amer: >60 mL/min (ref >60)
GFR calc non Af Amer: >60 mL/min (ref >60)
GLUCOSE: 99 mg/dL (ref 65–99)
Potassium: 3.8 mmol/L (ref 3.5–5.1)
Sodium: 136 mmol/L (ref 135–145)

## 2017-10-13 LAB — MAGNESIUM: Magnesium: 1.8 mg/dL (ref 1.7–2.4)

## 2017-10-13 MED ORDER — SENNA-DOCUSATE SODIUM 8.6-50 MG PO TABS: 1.0000 | ORAL_TABLET | Freq: Every evening | ORAL | 0 refills | Status: AC | PRN

## 2017-10-13 MED ORDER — POLYETHYLENE GLYCOL 3350 17 G PO PACK: 17.0000 g | PACK | Freq: Two times a day (BID) | ORAL | 0 refills | Status: AC

## 2017-10-13 NOTE — Discharge Summary (Signed)
Barry Dean CVE:[id number] DOB: June 20, 1924 DOA: 10/10/2017  PCP: Barry Poll, MD  Admit date: 10/10/2017  Discharge date: 10/13/2017  Admitted From: ALF   Disposition:  ALF   Recommendations for Outpatient Follow-up:   Follow up with PCP in 1-2 weeks  PCP Please obtain BMP/CBC, 2 view CXR in 1week,  (see Discharge instructions)   PCP Please follow up on the following pending results: Review CT scan for outpatient follow-up and monitoring as appropriate by his age.   Home Health: PT,RN,Aide   Equipment/Devices: None  Consultations: CCS Discharge Condition: Fair   CODE STATUS: DNR   Diet Recommendation:  Heart Healthy    Chief Complaint  Patient presents with  . Emesis     Brief history of present illness from the day of admission and additional interim summary     Patient is a 84 y.o. male history of seizure disorder, depression, dementia, hypertension who presented to the ED for evaluation of abdominal pain, nausea and vomiting, was found to have possible small bowel obstruction admitted to the hospitalist service.  See below for further details                                                                  Hospital Course    Possible partial small bowel obstruction:  Could all have been due to massive obstipation as well, he was treated conservatively, general surgery was on board,  x-ray suspicious for stool impaction in the rectum, he responded very well to tap water enema and had 3 large bowel movements, completely symptom-free now, tolerated clear diet and no soft diet, case discussed with general surgeon Dr. [name] this morning cleared for discharge back to ALF.  I have adjusted his bowel regimen at the ALF to ensure he has regular bowel movements.  PCP kindly monitor.   Hypokalemia: Place  will monitor along with magnesium levels.  Seizure disorder:  To new home medications unchanged.     Hypertension: Continue home medications unchanged upon discharge.  Paroxysmal atrial fibrillation: Rate controlled-due to advanced age, frailty, fall risk not a candidate for anticoagulation.  On aspirin in the outpatient setting will be continued upon discharge.  Chronic diastolic heart failure: Compensated-acute issues.   GERD:PPI     Discharge diagnosis     Principal Problem:   SBO (small bowel obstruction) (HCC) Active Problems:   Seizure disorder (HCC)   HTN (hypertension)   Benign essential HTN   Parkinson's disease (HCC)   PAF (paroxysmal atrial fibrillation) ([place])   Hiatal hernia    Discharge instructions    Discharge Instructions    Diet - low sodium heart healthy   Complete by:  As directed    Discharge instructions   Complete by:  As directed    Follow with Primary MD  Barry Poll, MD in 7 days   Get CBC, BMP,  checked  by Primary MD or SNF MD in 5-7 days   Activity: As tolerated with Full fall precautions use walker/cane & assistance as needed  Disposition ALF  Diet:   Heart Healthy    For Heart failure patients - Check your Weight same time everyday, if you gain over 2 pounds, or you develop in leg swelling, experience more shortness of breath or chest pain, call your Primary MD immediately. Follow Cardiac Low Salt Diet and 1.5 lit/day fluid restriction.  Special Instructions: If you have smoked or chewed Tobacco  in the last 2 yrs please stop smoking, stop any regular Alcohol  and or any Recreational drug use.  On your next visit with your primary care physician please Get Medicines reviewed and adjusted.  Please request your Prim.MD to go over all Hospital Tests and Procedure/Radiological results at the follow up, please get all Hospital records sent to your Prim MD by signing hospital release before you go home.  If you experience worsening of  your admission symptoms, develop shortness of breath, life threatening emergency, suicidal or homicidal thoughts you must seek medical attention immediately by calling 911 or calling your MD immediately  if symptoms less severe.  You Must read complete instructions/literature along with all the possible adverse reactions/side effects for all the Medicines you take and that have been prescribed to you. Take any new Medicines after you have completely understood and accpet all the possible adverse reactions/side effects.   Increase activity slowly   Complete by:  As directed       Discharge Medications   Allergies as of 10/13/2017      Reactions   Topamax Other (See Comments)   Reaction:  Unknown    Uroxatral [alfuzosin Hydrochloride] Other (See Comments)   Reaction:  Unknown    Valium Other (See Comments)   Reaction:  Unknown       Medication List    TAKE these medications   amLODipine 5 MG tablet Commonly known as:  NORVASC Take 1 tablet (5 mg total) by mouth daily.   aspirin 81 MG chewable tablet Chew 1 tablet (81 mg total) by mouth every evening.   feeding supplement (ENSURE ENLIVE) Liqd Take 237 mLs by mouth daily.   ferrous sulfate 325 (65 FE) MG tablet Take 1 tablet (325 mg total) by mouth 2 (two) times daily with a meal.   ipratropium-albuterol 0.5-2.5 (3) MG/3ML Soln Commonly known as:  DUONEB Take 3 mLs by nebulization every 6 (six) hours as needed. What changed:  reasons to take this   VIMPAT 100 MG Tabs Generic drug:  Lacosamide Take 200 mg by mouth every evening. What changed:  Another medication with the same name was changed. Make sure you understand how and when to take each.   lacosamide 50 MG Tabs tablet Commonly known as:  VIMPAT Take 1 tablet in the AM (50 mg) and 2 tablets in the evening What changed:    how much to take  how to take this  when to take this  additional instructions   lamoTRIgine 100 MG tablet Commonly known as:   LAMICTAL Take 1 tablet (100 mg total) by mouth 2 (two) times daily.   loperamide 2 MG capsule Commonly known as:  IMODIUM Take by mouth as needed for diarrhea or loose stools.   mineral oil external liquid Place 3 drops into both ears every Friday.   multivitamin capsule Take 1  capsule by mouth daily.   ondansetron 4 MG tablet Commonly known as:  ZOFRAN Take 4 mg by mouth every 8 (eight) hours as needed for nausea or vomiting.   OXYGEN Inhale 2 L into the lungs See admin instructions. Use 2L continuous at bedtime. Use 2L as needed for shortness of breath.   pantoprazole 20 MG tablet Commonly known as:  PROTONIX Take 20 mg by mouth daily.   polyethylene glycol packet Commonly known as:  MIRALAX Take 17 g by mouth 2 (two) times daily. What changed:  when to take this   sennosides-docusate sodium 8.6-50 MG tablet Commonly known as:  SENOKOT-S Take 1 tablet by mouth at bedtime as needed for constipation.   vitamin B-12 1000 MCG tablet Commonly known as:  CYANOCOBALAMIN Take 2,000 mcg by mouth daily.         Major procedures and Radiology Reports - PLEASE review detailed and final reports thoroughly  -         Ct Abdomen Pelvis Wo Contrast  Result Date: 10/10/2017 CLINICAL DATA:  Abdominal distention and vomiting EXAM: CT ABDOMEN AND PELVIS WITHOUT CONTRAST TECHNIQUE: Multidetector CT imaging of the abdomen and pelvis was performed following the standard protocol without IV contrast. COMPARISON:  12/19/2016 FINDINGS: Despite efforts by the technologist and patient, motion artifact is present on today's exam and could not be eliminated. This reduces exam sensitivity and specificity. Lower chest: Reticulonodular interstitial opacities at both lung bases potentially from atypical infectious process. Calcified reticulonodular opacities in the right middle lobe, unchanged from prior. Passive atelectasis associated with the large hiatal hernia which contains most of the stomach.  The intrathoracic portion of the stomach is distended and contains an air-fluid level. Centrilobular emphysema. Coronary, aortic arch, and branch vessel atherosclerotic vascular disease. The prior trace left pleural effusion has resolved. Hepatobiliary: Unremarkable Pancreas: Indistinct pancreas due to atrophy and motion artifact, no gross abnormality observed. Spleen: Unremarkable Adrenals/Urinary Tract: Unremarkable Stomach/Bowel: There is stool throughout the colon. Loops of dilated proximal small bowel up to 4.1 cm are noted with internal air-fluid levels. This includes the duodenum. In the mid small bowel there is a transition from fluid-filled mildly dilated loops to nondilated small bowel in the left abdomen, with some slight angulation of the loop which could reflect a low-grade adhesion. Vascular/Lymphatic: Aortoiliac atherosclerotic vascular disease. No pathologic adenopathy. Reproductive: Unremarkable Other: Trace amount of free pelvic fluid. Musculoskeletal: Levoconvex lumbar scoliosis with rotary component. Healed deformity in the left humeral shaft. 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 Large hiatal hernia with intrathoracic portion of the stomach dilated with an air-fluid level, and with dilated proximal loops of small bowel terminating in a mild angulation which could conceivably be from an adhesion. The small bowel distal to this point does not appear particularly dilated. The transition is shown on images 55-74 of series 5. 2. 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 3. Aortic Atherosclerosis (ICD10-I70.0) and Emphysema (ICD10-J43.9). Coronary atherosclerosis. 4. Trace amount of free pelvic fluid. 5. 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 Levoconvex lumbar scoliosis with rotary  component. Electronically Signed   By: Van Clines M.D.   On: 10/10/2017 10:32   Dg Abd 1 View  Result Date: 10/11/2017 CLINICAL DATA:  Follow-up small bowel obstruction EXAM: ABDOMEN - 1 VIEW COMPARISON:  KUB October 10, 2017 FINDINGS: There is a large  stool ball in the rectum. Contrast is seen throughout the colon. Air-filled loops of large and small bowel are identified. IMPRESSION: Air-filled loops of large and small bowel are identified. This could represent ileus or partial small bowel obstruction. There is contrast throughout the colon and a large stool ball in the rectum. Electronically Signed   By: Dorise Bullion III M.D   On: 10/11/2017 15:00   Dg Abdomen 1 View  Result Date: 10/10/2017 CLINICAL DATA:  Status post nasogastric tube placement. EXAM: ABDOMEN - 1 VIEW COMPARISON:  Abdominal and pelvic CT scan of October 10, 2017. FINDINGS: There is a large hiatal hernia-partially intrathoracic stomach. Here the esophagogastric tube tip and proximal port are found. There are loops of moderately distended gas-filled small bowel stacked in the upper and mid abdomen. IMPRESSION: The esophagogastric tube tip and proximal port lie within the large hiatal hernia-partially intrathoracic stomach. Small bowel ileus or mid to distal partial small bowel obstruction. Electronically Signed   By: David  Martinique M.D.   On: 10/10/2017 14:40   Dg Chest Port 1 View  Result Date: 10/10/2017 CLINICAL DATA:  Nausea, vomiting. EXAM: PORTABLE CHEST 1 VIEW COMPARISON:  Radiograph of May 18, 2017. FINDINGS: Stable cardiomegaly. Atherosclerosis of thoracic aorta is noted. No pneumothorax or pleural effusion is noted. Stable hiatal hernia is noted. Stable bibasilar scarring is noted. No acute abnormality is noted. Bony thorax is unremarkable. IMPRESSION: Aortic atherosclerosis. Stable hiatal hernia. Stable bibasilar scarring. Electronically Signed   By: Marijo Conception, M.D.   On: 10/10/2017 08:48   Dg Abd  Portable 1v  Result Date: 10/12/2017 CLINICAL DATA:  Small bowel obstruction. EXAM: PORTABLE ABDOMEN - 1 VIEW COMPARISON:  10/11/2017. FINDINGS: Persistent gaseous distension of small bowel. There is oral contrast in the majority of the colon, with large amount of stool in the rectum. Sutures are seen in the right lower quadrant. IMPRESSION: Bowel-gas pattern may be due to an ileus and fecal impaction. Partial small bowel obstruction is not excluded. Electronically Signed   By: Lorin Picket M.D.   On: 10/12/2017 08:28   Dg Abd Portable 1v-small Bowel Obstruction Protocol-initial, 8 Hr Delay  Result Date: 10/11/2017 CLINICAL DATA:  Small bowel obstruction. EXAM: PORTABLE ABDOMEN - 1 VIEW COMPARISON:  10/11/2017 at 1439 hours FINDINGS: The upper abdomen was partially excluded from view. There is a similar appearance of oral contrast throughout the colon as well as of a large amount of stool in the rectum. Moderate gaseous distension of multiple small bowel loops throughout the central abdomen is similar to the prior study. IMPRESSION: No significant interval change. Contrast throughout the colon with large amount of stool in the rectum. Gaseous small bowel distension which could reflect ileus or partial obstruction. Electronically Signed   By: Logan Bores M.D.   On: 10/11/2017 20:19    Micro Results     Recent Results (from the past 240 hour(s))  Urine culture     Status: None   Collection Time: 10/10/17  9:23 AM  Result Value Ref Range Status   Specimen Description   Final    URINE, CATHETERIZED Performed at [facility] [address], [place], [place] [address]    Special Requests   Final    NONE Performed at [facility], [place] [address], [place], [place] [address]    Culture   Final    NO GROWTH Performed at [facility], [place] [address], [place], [place] [address]  Report Status 10/11/2017 FINAL  Final  MRSA PCR Screening     Status:  None   Collection Time: 10/11/17  7:42 AM  Result Value Ref Range Status   MRSA by PCR NEGATIVE NEGATIVE Final    Comment:        The GeneXpert MRSA Assay (FDA approved for NASAL specimens only), is one component of a comprehensive MRSA colonization surveillance program. It is not intended to diagnose MRSA infection nor to guide or monitor treatment for MRSA infections. Performed at [name], [place] [address] [place], [place] [address]     Today   Subjective    [name] today has no headache,no chest abdominal pain,no new weakness tingling or numbness, feels much better wants to go home/ALF today.     Objective   Blood pressure 137/77, pulse (!) 57, temperature 99.4 F (37.4 C), temperature source Axillary, resp. rate 16, weight 80.7 kg (177 lb 14.6 oz), SpO2 98 %.   Intake/Output Summary (Last 24 hours) at 10/13/2017 1020 Last data filed at 10/13/2017 0428 Gross per 24 hour  Intake 1520 ml  Output 1300 ml  Net 220 ml    Exam Awake Alert, No new F.N deficits, Normal affect [place].AT,PERRAL Supple Neck,No JVD, No cervical lymphadenopathy appriciated.  Symmetrical Chest wall movement, Good air movement bilaterally, CTAB RRR,No Gallops,Rubs or new Murmurs, No Parasternal Heave +ve B.Sounds, Abd Soft, Non tender, No organomegaly appriciated, No rebound -guarding or rigidity. No Cyanosis, Clubbing or edema, No new Rash or bruise   Data Review   CBC w Diff:  Lab Results  Component Value Date   WBC 7.4 10/13/2017   HGB 11.8 (L) 10/13/2017   HGB 12.9 (L) 10/01/2017   HCT 35.2 (L) 10/13/2017   HCT 40.1 10/01/2017   PLT 209 10/13/2017   PLT 275 10/01/2017   LYMPHOPCT 24 05/22/2017   MONOPCT 12 05/22/2017   EOSPCT 1 05/22/2017   BASOPCT 0 05/22/2017    CMP:  Lab Results  Component Value Date   NA 136 10/13/2017   NA 139 10/01/2017   K 3.8 10/13/2017   CL 104 10/13/2017   CO2 27 10/13/2017   BUN 11 10/13/2017   BUN 13 10/01/2017    CREATININE 0.54 (L) 10/13/2017   PROT 7.9 10/10/2017   PROT 6.4 10/01/2017   ALBUMIN 4.4 10/10/2017   ALBUMIN 3.9 10/01/2017   BILITOT 0.8 10/10/2017   BILITOT 0.2 10/01/2017   ALKPHOS 92 10/10/2017   AST 31 10/10/2017   ALT 21 10/10/2017  .   Total Time in preparing paper work, data evaluation and todays exam - 68 minutes  Lala Lund M.D on 10/13/2017 at 10:20 AM  [facility]   Office  [phone]

## 2017-10-13 NOTE — Progress Notes (Signed)
Nurse called report to [facility].  Pt will be transported by his family to the facility.  Discharge packet given to family prior to discharge.

## 2017-10-13 NOTE — Progress Notes (Signed)
Discharge to: [facility] Anticipated discharge date: 10/13/17 Family notified: Yes, at bedside Transportation by: Family car  Report #: [phone]  [facility] signing off.  Laveda Abbe LCSW [phone]

## 2017-10-13 NOTE — NC FL2 (Signed)
[place] LEVEL OF CARE SCREENING TOOL     IDENTIFICATION  Patient Name: Barry Dean Birthdate: 1924-07-08 Sex: male Admission Date (Current Location): 10/10/2017  [facility] and [place] Number:  Herbalist and Address:  [facility],  [place] [address] [place]      Provider Number: [id number]  Attending Physician Name and Address:  Thurnell Lose, MD  Relative Name and Phone Number:       Current Level of Care: Hospital Recommended Level of Care: [place] Prior Approval Number:    Date Approved/Denied:   PASRR Number:    Discharge Plan: Other (Comment)(ALF)    Current Diagnoses: Patient Active Problem List   Diagnosis Date Noted  . SBO (small bowel obstruction) ([place]) 10/10/2017  . Hiatal hernia 10/10/2017  . Pressure injury of skin 12/22/2016  . Aspiration pneumonia of both lower lobes due to gastric secretions ([place]) 12/20/2016  . Symptomatic anemia 12/19/2016  . Fall 12/19/2016  . Elevated troponin 12/19/2016  . Acute on chronic diastolic heart failure ([place]) 03/22/2016  . Sepsis, unspecified organism ([place])   . Hypokalemia   . Hypomagnesemia   . Sepsis due to pneumonia ([place])   . Sepsis ([place]) 03/12/2016  . Nausea & vomiting 03/12/2016  . Diarrhea 03/12/2016  . Acute encephalopathy 03/12/2016  . Acute kidney injury ([place]) 03/12/2016  . Acute respiratory failure with hypoxia ([place]) 03/12/2016  . Septic shock ([place])   . Abnormality of gait 09/27/2015  . Tremor 09/27/2015  . Undiagnosed cardiac murmurs 03/06/2015  . Syncope and collapse 03/05/2015  . Parkinson's disease ([place]) 03/05/2015  . PAF (paroxysmal atrial fibrillation) ([place]) 03/05/2015  . Bradycardia 03/05/2015  . Deafness 03/05/2015  . Mild mitral regurgitation 03/05/2015  . Emesis, persistent 09/14/2014  . Benign essential HTN 09/14/2014  . Syncope 09/13/2013  . ?? Respiratory failure 09/13/2013  . Noninfectious gastroenteritis and  colitis 11/12/2012  . Acute and chronic respiratory failure 11/12/2012  . Community acquired pneumonia 11/12/2012  . HCAP (healthcare-associated pneumonia) 07/18/2011  . Seizure disorder ([place]) 07/18/2011  . HTN (hypertension) 07/18/2011  . Hyponatremia 07/18/2011    Orientation RESPIRATION BLADDER Height & Weight     Self  O2(see DC summary) Incontinent, External catheter(catheter placed 10/10/17) Weight: 177 lb 14.6 oz (80.7 kg) Height:     BEHAVIORAL SYMPTOMS/MOOD NEUROLOGICAL BOWEL NUTRITION STATUS      Continent Diet(no added salt)  AMBULATORY STATUS COMMUNICATION OF NEEDS Skin   Limited Assist Verbally Skin abrasions(open wound, left ankle; gauze dressing)                       Personal Care Assistance Level of Assistance  Bathing, Feeding, Dressing Bathing Assistance: Limited assistance Feeding assistance: Limited assistance Dressing Assistance: Limited assistance     Functional Limitations Info  Sight, Hearing, Speech Sight Info: Impaired Hearing Info: Impaired(hearing aids) Speech Info: Adequate    SPECIAL CARE FACTORS FREQUENCY                       Contractures Contractures Info: Not present    Additional Factors Info  Code Status, Allergies, Psychotropic Code Status Info: DNR Allergies Info: Topamax, Uroxatral Alfuzosin Hydrochloride, Valium Psychotropic Info: Lamictal 100mg  2x/day         Current Medications (10/13/2017):  This is the current hospital active medication list Current Facility-Administered Medications  Medication Dose Route Frequency Provider Last Rate Last Dose  . aspirin chewable tablet 81 mg  81 mg Oral QPM Purohit, Shrey C, MD   81 mg at 10/12/17 1745  . collagenase (SANTYL) ointment   Topical Daily Ghimire, Shanker M, MD      . enoxaparin (LOVENOX) injection 40 mg  40 mg Subcutaneous Q24H Purohit, Shrey C, MD   40 mg at 10/12/17 1745  . hydrALAZINE (APRESOLINE) injection 10 mg  10 mg Intravenous Q4H PRN Opyd, Ilene Qua, MD    10 mg at 10/11/17 0720  . iopamidol (ISOVUE-300) 61 % injection 30 mL  30 mL Oral Once PRN Charlesetta Shanks, MD      . ipratropium-albuterol (DUONEB) 0.5-2.5 (3) MG/3ML nebulizer solution 3 mL  3 mL Nebulization Q6H PRN Purohit, Shrey C, MD      . lacosamide (VIMPAT) 200 mg in sodium chloride 0.9 % 25 mL IVPB  200 mg Intravenous QPM Jonetta Osgood, MD   Stopped at 10/12/17 1835  . lacosamide (VIMPAT) 50 mg in sodium chloride 0.9 % 25 mL IVPB  50 mg Intravenous q morning - 10a Ghimire, Henreitta Leber, MD   Stopped at 10/13/17 1035  . lamoTRIgine (LAMICTAL) tablet 100 mg  100 mg Oral BID Purohit, Shrey C, MD   100 mg at 10/13/17 1003  . morphine 2 MG/ML injection 2 mg  2 mg Intravenous Q3H PRN Purohit, Shrey C, MD      . ondansetron (ZOFRAN) tablet 4 mg  4 mg Oral Q6H PRN Purohit, Shrey C, MD       Or  . ondansetron (ZOFRAN) injection 4 mg  4 mg Intravenous Q6H PRN Purohit, Shrey C, MD      . pantoprazole (PROTONIX) injection 40 mg  40 mg Intravenous Q24H Jonetta Osgood, MD   40 mg at 10/12/17 1210     Discharge Medications: Please see discharge summary for a list of discharge medications.  Relevant Imaging Results:  Relevant Lab Results:   Additional Information SS#: [id number]  Geralynn Ochs, LCSW

## 2017-10-13 NOTE — Discharge Instructions (Signed)
Follow with Primary MD [name], MD in 7 days   Get CBC, BMP,  checked  by Primary MD or SNF MD in 5-7 days   Activity: As tolerated with Full fall precautions use walker/cane & assistance as needed  Disposition ALF  Diet:   Heart Healthy    For Heart failure patients - Check your Weight same time everyday, if you gain over 2 pounds, or you develop in leg swelling, experience more shortness of breath or chest pain, call your Primary MD immediately. Follow Cardiac Low Salt Diet and 1.5 lit/day fluid restriction.  Special Instructions: If you have smoked or chewed Tobacco  in the last 2 yrs please stop smoking, stop any regular Alcohol  and or any Recreational drug use.  On your next visit with your primary care physician please Get Medicines reviewed and adjusted.  Please request your Prim.MD to go over all Hospital Tests and Procedure/Radiological results at the follow up, please get all Hospital records sent to your Prim MD by signing hospital release before you go home.  If you experience worsening of your admission symptoms, develop shortness of breath, life threatening emergency, suicidal or homicidal thoughts you must seek medical attention immediately by calling 911 or calling your MD immediately  if symptoms less severe.  You Must read complete instructions/literature along with all the possible adverse reactions/side effects for all the Medicines you take and that have been prescribed to you. Take any new Medicines after you have completely understood and accpet all the possible adverse reactions/side effects.

## 2017-10-17 ENCOUNTER — Other Ambulatory Visit: Payer: Self-pay

## 2017-10-17 ENCOUNTER — Emergency Department (HOSPITAL_COMMUNITY)
Admission: EM | Admit: 2017-10-17 | Discharge: 2017-10-18 | Disposition: A | Discharge status: Home / Self Care | Payer: Medicare Other | Attending: Emergency Medicine | Admitting: Emergency Medicine

## 2017-10-17 ENCOUNTER — Encounter (HOSPITAL_COMMUNITY): Payer: Self-pay

## 2017-10-17 ENCOUNTER — Emergency Department (HOSPITAL_COMMUNITY): Payer: Medicare Other

## 2017-10-17 DIAGNOSIS — Z7982 Long term (current) use of aspirin: Secondary | ICD-10-CM | POA: Diagnosis not present

## 2017-10-17 DIAGNOSIS — W19XXXA Unspecified fall, initial encounter: Secondary | ICD-10-CM

## 2017-10-17 DIAGNOSIS — Z85820 Personal history of malignant melanoma of skin: Secondary | ICD-10-CM | POA: Diagnosis not present

## 2017-10-17 DIAGNOSIS — W050XXA Fall from non-moving wheelchair, initial encounter: Secondary | ICD-10-CM | POA: Insufficient documentation

## 2017-10-17 DIAGNOSIS — Y9389 Activity, other specified: Secondary | ICD-10-CM | POA: Insufficient documentation

## 2017-10-17 DIAGNOSIS — Z79811 Long term (current) use of aromatase inhibitors: Secondary | ICD-10-CM | POA: Diagnosis not present

## 2017-10-17 DIAGNOSIS — G20 Parkinson's disease: Secondary | ICD-10-CM | POA: Insufficient documentation

## 2017-10-17 DIAGNOSIS — I110 Hypertensive heart disease with heart failure: Secondary | ICD-10-CM | POA: Insufficient documentation

## 2017-10-17 DIAGNOSIS — Y92121 Bathroom in nursing home as the place of occurrence of the external cause: Secondary | ICD-10-CM | POA: Diagnosis not present

## 2017-10-17 DIAGNOSIS — M6281 Muscle weakness (generalized): Secondary | ICD-10-CM | POA: Diagnosis not present

## 2017-10-17 DIAGNOSIS — I5030 Unspecified diastolic (congestive) heart failure: Secondary | ICD-10-CM | POA: Insufficient documentation

## 2017-10-17 DIAGNOSIS — Z043 Encounter for examination and observation following other accident: Secondary | ICD-10-CM | POA: Insufficient documentation

## 2017-10-17 DIAGNOSIS — Y999 Unspecified external cause status: Secondary | ICD-10-CM | POA: Insufficient documentation

## 2017-10-17 DIAGNOSIS — R4182 Altered mental status, unspecified: Secondary | ICD-10-CM | POA: Diagnosis not present

## 2017-10-17 LAB — URINALYSIS, ROUTINE W REFLEX MICROSCOPIC
Bilirubin Urine: NEGATIVE
Glucose, UA: NEGATIVE mg/dL
Hgb urine dipstick: NEGATIVE
Ketones, ur: NEGATIVE mg/dL
Leukocytes, UA: NEGATIVE
Nitrite: NEGATIVE
Protein, ur: NEGATIVE mg/dL
Specific Gravity, Urine: 1.024 (ref 1.005–1.030)
pH: 5 (ref 5.0–8.0)

## 2017-10-17 NOTE — ED Triage Notes (Addendum)
Pt arrives from [facility] reports pt fell while transferring from wheelchair to toilet. EMS found pt on right side. Pt not on blood thinners and EMS reports he did not hit his head. Pt hard of hearing and hx of parkinsons. Per [facility], staff reports pt leaning to left side more than usual. Pt arrives having defecated on self. Pt cleaned, pericare performed, dirty clothes removed, gown and non-slip socks applied.

## 2017-10-17 NOTE — ED Notes (Signed)
An Schnabel (Daughter): [phone], daughter requests update if pt is admitted or transported back to facility

## 2017-10-17 NOTE — ED Notes (Signed)
Dee to call [facility] for pt transport to [facility]

## 2017-10-17 NOTE — ED Notes (Addendum)
Barry Dean, daughter, informed of pt discharge status back to [facility]. Manuela Schwartz has pt's life alert necklace and hearing aids. [facility] informed that daughter has belongings and daughter stated she will be bringing them to the facility tomorrow.

## 2017-10-17 NOTE — ED Notes (Signed)
ED Provider at bedside. 

## 2017-10-17 NOTE — ED Notes (Signed)
Patient transported to CT 

## 2017-10-18 NOTE — ED Notes (Signed)
Report/papers given to [facility].

## 2017-10-18 NOTE — ED Provider Notes (Signed)
[facility] EMERGENCY DEPARTMENT Provider Note   CSN: [id number] Arrival date & time: 10/17/17  1950     History   Chief Complaint Chief Complaint  Patient presents with  . Fall    HPI Barry Dean is a 84 y.o. male.  HPI   84 year old male presenting after fall.  Occurred while patient was transferring from wheelchair to toilet.  He fell on his right side.  Reportedly did not hit his head.  Not anticoagulated.  Patient is extremely hard of hearing.  He denies any acute pain though.  His daughter came to bedside.  She reports that he seems to be in his baseline mental status.  Past Medical History:  Diagnosis Date  . Abnormality of gait   . Anxiety   . Arthritis   . Atrial fibrillation ([place])   . BPH (benign prostatic hyperplasia)   . Deafness   . Dehydration   . Depression   . GERD (gastroesophageal reflux disease)   . Hypertension   . Melanoma ([place])   . Mitral regurgitation   . Reflux   . Seizure ([place])   . Seizures ([place])   . Syncope   . Tremor 09/27/2015  . Tremor, essential     Patient Active Problem List   Diagnosis Date Noted  . SBO (small bowel obstruction) ([place]) 10/10/2017  . Hiatal hernia 10/10/2017  . Pressure injury of skin 12/22/2016  . Aspiration pneumonia of both lower lobes due to gastric secretions ([place]) 12/20/2016  . Symptomatic anemia 12/19/2016  . Fall 12/19/2016  . Elevated troponin 12/19/2016  . Acute on chronic diastolic heart failure ([place]) 03/22/2016  . Sepsis, unspecified organism ([place])   . Hypokalemia   . Hypomagnesemia   . Sepsis due to pneumonia ([place])   . Sepsis ([place]) 03/12/2016  . Nausea & vomiting 03/12/2016  . Diarrhea 03/12/2016  . Acute encephalopathy 03/12/2016  . Acute kidney injury ([place]) 03/12/2016  . Acute respiratory failure with hypoxia ([place]) 03/12/2016  . Septic shock ([place])   . Abnormality of gait 09/27/2015  . Tremor 09/27/2015  . Undiagnosed cardiac murmurs 03/06/2015  . Syncope and  collapse 03/05/2015  . Parkinson's disease ([place]) 03/05/2015  . PAF (paroxysmal atrial fibrillation) ([place]) 03/05/2015  . Bradycardia 03/05/2015  . Deafness 03/05/2015  . Mild mitral regurgitation 03/05/2015  . Emesis, persistent 09/14/2014  . Benign essential HTN 09/14/2014  . Syncope 09/13/2013  . ?? Respiratory failure 09/13/2013  . Noninfectious gastroenteritis and colitis 11/12/2012  . Acute and chronic respiratory failure 11/12/2012  . Community acquired pneumonia 11/12/2012  . HCAP (healthcare-associated pneumonia) 07/18/2011  . Seizure disorder ([place]) 07/18/2011  . HTN (hypertension) 07/18/2011  . Hyponatremia 07/18/2011    Past Surgical History:  Procedure Laterality Date  . APPENDECTOMY    . CATARACT EXTRACTION W/ INTRAOCULAR LENS  IMPLANT, BILATERAL    . ESOPHAGOGASTRODUODENOSCOPY (EGD) WITH PROPOFOL N/A 09/15/2014   Procedure: ESOPHAGOGASTRODUODENOSCOPY (EGD) WITH PROPOFOL;  Surgeon: [name], MD;  Location: [name] ENDOSCOPY;  Service: Endoscopy;  Laterality: N/A;  . unknown         Home Medications    Prior to Admission medications   Medication Sig Start Date End Date Taking? Authorizing Provider  amLODipine (NORVASC) 5 MG tablet Take 1 tablet (5 mg total) by mouth daily. 12/23/16  Yes TatShanon Brow, MD  aspirin 81 MG chewable tablet Chew 1 tablet (81 mg total) by mouth every evening. 09/22/14  Yes Thurnell Lose, MD  collagenase (SANTYL) ointment Apply 1  application topically daily. Apply to the left side topically every day shift for wound care Cleanse area with wound cleanser. Apply Santyl cover/ with DD or non-adhesive dressin   Yes [provider]  feeding supplement, ENSURE ENLIVE, (ENSURE ENLIVE) LIQD Take 237 mLs by mouth daily. 03/22/16  Yes Eugenie Filler, MD  ferrous sulfate 325 (65 FE) MG tablet Take 1 tablet (325 mg total) by mouth 2 (two) times daily with a meal. 12/22/16  Yes Tat, Shanon Brow, MD  ipratropium-albuterol (DUONEB) 0.5-2.5 (3) MG/3ML  SOLN Take 3 mLs by nebulization every 6 (six) hours as needed. Patient taking differently: Take 3 mLs by nebulization every 6 (six) hours as needed (for shortness of breath).  03/22/16  Yes Eugenie Filler, MD  Lacosamide (VIMPAT) 100 MG TABS Take 200 mg by mouth every evening.   Yes [provider]  lacosamide (VIMPAT) 50 MG TABS tablet Take 1 tablet in the AM (50 mg) and 2 tablets in the evening Patient taking differently: Take 50 mg by mouth every morning. Take 1 tablet in the AM (50 mg). 06/14/17  Yes Kathrynn Ducking, MD  lamoTRIgine (LAMICTAL) 100 MG tablet Take 1 tablet (100 mg total) by mouth 2 (two) times daily. 12/13/16  Yes Kathrynn Ducking, MD  loperamide (IMODIUM) 2 MG capsule Take by mouth as needed for diarrhea or loose stools.   Yes [provider]  mineral oil external liquid Place 3 drops into both ears every Friday.    Yes [provider]  Multiple Vitamin (MULTIVITAMIN) capsule Take 1 capsule by mouth daily.   Yes [provider]  ondansetron (ZOFRAN) 4 MG tablet Take 4 mg by mouth every 8 (eight) hours as needed for nausea or vomiting.   Yes [provider]  OXYGEN Inhale 2 L into the lungs See admin instructions. Use 2L continuous at bedtime. Use 2L as needed for shortness of breath.   Yes [provider]  pantoprazole (PROTONIX) 20 MG tablet Take 20 mg by mouth daily.   Yes [provider]  polyethylene glycol (MIRALAX) packet Take 17 g by mouth 2 (two) times daily. 10/13/17  Yes Thurnell Lose, MD  sennosides-docusate sodium (SENOKOT-S) 8.6-50 MG tablet Take 1 tablet by mouth at bedtime as needed for constipation. 10/13/17  Yes Thurnell Lose, MD  vitamin B-12 (CYANOCOBALAMIN) 1000 MCG tablet Take 2,000 mcg by mouth daily.   Yes [provider]    Family History Family History  Problem Relation Age of Onset  . Cancer Mother        Skin cancer  . Stroke Father   . Parkinsonism Sister   .  Diabetes Brother     Social History Social History   Tobacco Use  . Smoking status: Never Smoker  . Smokeless tobacco: Never Used  Substance Use Topics  . Alcohol use: No  . Drug use: No     Allergies   Topamax; Uroxatral [alfuzosin hydrochloride]; and Valium   Review of Systems Review of Systems  All systems reviewed and negative, other than as noted in HPI.  Physical Exam Updated Vital Signs BP (!) 141/73   Pulse 92   Temp 99.7 F (37.6 C) (Rectal)   Resp 20   SpO2 93%   Physical Exam  Constitutional: He appears well-developed and well-nourished. No distress.  HENT:  Head: Normocephalic and atraumatic.  Eyes: Conjunctivae are normal. Right eye exhibits no discharge. Left eye exhibits no discharge.  Neck: Neck supple.  Cardiovascular:  Normal rate, regular rhythm and normal heart sounds. Exam reveals no gallop and no friction rub.  No murmur heard. Pulmonary/Chest: Effort normal and breath sounds normal. No respiratory distress.  Abdominal: Soft. He exhibits no distension. There is no tenderness.  Musculoskeletal: He exhibits no edema or tenderness.  Small ulcerated area to the posterior left foot with some mild swelling.  Bandage over top.  Daughter reports that this was evaluated earlier today.  No external signs of acute trauma.  Neurological: He is alert.  Screaming hard of hearing.  Moves all extremities on command but with global weakness.  Skin: Skin is warm and dry.  Psychiatric: He has a normal mood and affect. His behavior is normal. Thought content normal.  Nursing note and vitals reviewed.    ED Treatments / Results  Labs (all labs ordered are listed, but only abnormal results are displayed) Labs Reviewed  URINALYSIS, ROUTINE W REFLEX MICROSCOPIC - Abnormal; Notable for the following components:      Result Value   Color, Urine AMBER (*)    APPearance HAZY (*)    All other components within normal limits    EKG  EKG Interpretation None         Radiology Ct Head Wo Contrast  Result Date: 10/17/2017 CLINICAL DATA:  Fall EXAM: CT HEAD WITHOUT CONTRAST TECHNIQUE: Contiguous axial images were obtained from the base of the skull through the vertex without intravenous contrast. COMPARISON:  05/22/2017 head CT, 12/19/2016, 07/08/2016, 03/05/2015 FINDINGS: Brain: No acute territorial infarction, hemorrhage or new intracranial mass is visualized. Atrophy and moderate small vessel ischemic changes of the white matter. Stable ventricle size. Stable soft tissue density at the cavernous sinus. Vascular: No hyperdense vessels. Ectatic calcified carotid arteries at the skull base. Skull: No fracture Sinuses/Orbits: Mucosal thickening in the ethmoid sinuses. Near complete opacification of left maxillary sinus. No acute orbital abnormality. Other: None IMPRESSION: 1. No CT evidence for acute intracranial abnormality. 2. Atrophy with small vessel ischemic changes of the white matter Electronically Signed   By: Donavan Foil M.D.   On: 10/17/2017 21:13    Procedures Procedures (including critical care time)  Medications Ordered in ED Medications - No data to display   Initial Impression / Assessment and Plan / ED Course  I have reviewed the triage vital signs and the nursing notes.  Pertinent labs & imaging results that were available during my care of the patient were reviewed by me and considered in my medical decision making (see chart for details).    84 year old male evaluated after a fall.  No external signs of acute trauma.  Reportedly it is baseline.  CT without acute abnormality.  UA looks fine.  I feel is appropriate for discharge back to his facility.  Final Clinical Impressions(s) / ED Diagnoses   Final diagnoses:  Fall, initial encounter    ED Discharge Orders    None       Virgel Manifold, MD 10/25/17 [phone]

## 2017-10-25 ENCOUNTER — Telehealth: Payer: Self-pay | Admitting: Neurology

## 2017-10-25 MED ORDER — LACOSAMIDE 50 MG PO TABS: ORAL_TABLET | ORAL | 4 refills | Status: AC

## 2017-10-25 NOTE — Telephone Encounter (Signed)
The Vimpat dose was reduced to 50 mg in the morning and 150 mg in the evening, not 50 mg twice daily.  The patient was taking 50 mg in the morning and 200 mg in the evening.

## 2017-10-25 NOTE — Telephone Encounter (Signed)
The patient has had issues with multiple falls.  He was just recently seen in the emergency room following a fall.  The Vimpat dose will be reduced to 50 mg twice daily, this medication sometimes may result in dizziness and increased fall risk.  The Lamictal dose will be the same.  If the seizures worsen, the Lamictal dose can be increased.

## 2017-11-12 ENCOUNTER — Telehealth: Payer: Self-pay | Admitting: Neurology

## 2017-11-12 NOTE — Telephone Encounter (Signed)
I called and left a message.  The patient is now on hospice.  Okay to switch the patient to Dilantin 300 mg daily and Keppra 250 mg twice a day.  The patient has gelastic seizures.

## 2017-11-12 NOTE — Telephone Encounter (Signed)
[facility] [phone] called the pt is on several anti seizure medications that are not on the Hospice formulary. The family is wanting to know if they can be changed.  lacosamide (VIMPAT) 50 MG TABS tablet and lamoTRIgine (LAMICTAL) 100 MG tablet can be changed to hospice formulary: Maintenance of seizures: Tegretol clonopin Valium depakote Dilantin Ativan Luminal Depakene keppra  The pt is at [facility] (p) [phone]  If the medications can be changed please fax order to (f) [phone]

## 2017-11-15 ENCOUNTER — Telehealth: Payer: Self-pay | Admitting: *Deleted

## 2017-11-15 NOTE — Telephone Encounter (Signed)
Faxed signed hospice order and instructions from Dr. [name] to stop lamictal and start Dilantin 300mg  qhs back to [facility] at Fax: [phone]. Received fax confirmation.

## 2017-11-26 ENCOUNTER — Other Ambulatory Visit (HOSPITAL_COMMUNITY)
Admit: 2017-11-26 | Discharge: 2017-11-26 | Disposition: A | Discharge status: Home / Self Care | Source: Ambulatory Visit | Attending: Internal Medicine | Admitting: Internal Medicine

## 2017-11-26 ENCOUNTER — Encounter (HOSPITAL_BASED_OUTPATIENT_CLINIC_OR_DEPARTMENT_OTHER): Payer: Medicare Other | Attending: Internal Medicine

## 2017-11-26 DIAGNOSIS — B9620 Unspecified Escherichia coli [E. coli] as the cause of diseases classified elsewhere: Secondary | ICD-10-CM | POA: Insufficient documentation

## 2017-11-26 DIAGNOSIS — L03116 Cellulitis of left lower limb: Secondary | ICD-10-CM | POA: Insufficient documentation

## 2017-11-26 DIAGNOSIS — I4891 Unspecified atrial fibrillation: Secondary | ICD-10-CM | POA: Diagnosis not present

## 2017-11-26 DIAGNOSIS — L89524 Pressure ulcer of left ankle, stage 4: Secondary | ICD-10-CM | POA: Insufficient documentation

## 2017-11-26 DIAGNOSIS — I509 Heart failure, unspecified: Secondary | ICD-10-CM | POA: Insufficient documentation

## 2017-11-26 DIAGNOSIS — F0390 Unspecified dementia without behavioral disturbance: Secondary | ICD-10-CM | POA: Insufficient documentation

## 2017-11-26 DIAGNOSIS — L97528 Non-pressure chronic ulcer of other part of left foot with other specified severity: Secondary | ICD-10-CM | POA: Diagnosis not present

## 2017-11-26 DIAGNOSIS — B952 Enterococcus as the cause of diseases classified elsewhere: Secondary | ICD-10-CM | POA: Insufficient documentation

## 2017-11-26 DIAGNOSIS — Z9181 History of falling: Secondary | ICD-10-CM | POA: Diagnosis not present

## 2017-11-26 DIAGNOSIS — I110 Hypertensive heart disease with heart failure: Secondary | ICD-10-CM | POA: Diagnosis not present

## 2017-11-30 LAB — AEROBIC CULTURE W GRAM STAIN (SUPERFICIAL SPECIMEN)

## 2017-11-30 LAB — AEROBIC CULTURE  (SUPERFICIAL SPECIMEN)

## 2017-12-03 DIAGNOSIS — L89524 Pressure ulcer of left ankle, stage 4: Secondary | ICD-10-CM | POA: Diagnosis not present

## 2017-12-10 DIAGNOSIS — L89524 Pressure ulcer of left ankle, stage 4: Secondary | ICD-10-CM | POA: Diagnosis not present

## 2017-12-24 DIAGNOSIS — L89524 Pressure ulcer of left ankle, stage 4: Secondary | ICD-10-CM | POA: Diagnosis not present

## 2018-01-07 ENCOUNTER — Encounter (HOSPITAL_BASED_OUTPATIENT_CLINIC_OR_DEPARTMENT_OTHER): Payer: Medicare Other | Attending: Internal Medicine

## 2018-01-07 ENCOUNTER — Other Ambulatory Visit (HOSPITAL_COMMUNITY)
Admit: 2018-01-07 | Discharge: 2018-01-07 | Disposition: A | Discharge status: Home / Self Care | Payer: Medicare Other | Source: Ambulatory Visit | Attending: Internal Medicine | Admitting: Internal Medicine

## 2018-01-07 DIAGNOSIS — Z515 Encounter for palliative care: Secondary | ICD-10-CM | POA: Diagnosis not present

## 2018-01-07 DIAGNOSIS — F0390 Unspecified dementia without behavioral disturbance: Secondary | ICD-10-CM | POA: Insufficient documentation

## 2018-01-07 DIAGNOSIS — L89320 Pressure ulcer of left buttock, unstageable: Secondary | ICD-10-CM | POA: Diagnosis not present

## 2018-01-07 DIAGNOSIS — X58XXXA Exposure to other specified factors, initial encounter: Secondary | ICD-10-CM | POA: Diagnosis not present

## 2018-01-07 DIAGNOSIS — I110 Hypertensive heart disease with heart failure: Secondary | ICD-10-CM | POA: Insufficient documentation

## 2018-01-07 DIAGNOSIS — L89322 Pressure ulcer of left buttock, stage 2: Secondary | ICD-10-CM | POA: Diagnosis present

## 2018-01-07 DIAGNOSIS — L89893 Pressure ulcer of other site, stage 3: Secondary | ICD-10-CM | POA: Insufficient documentation

## 2018-01-07 DIAGNOSIS — B9562 Methicillin resistant Staphylococcus aureus infection as the cause of diseases classified elsewhere: Secondary | ICD-10-CM | POA: Insufficient documentation

## 2018-01-07 DIAGNOSIS — S81801A Unspecified open wound, right lower leg, initial encounter: Secondary | ICD-10-CM | POA: Diagnosis not present

## 2018-01-07 DIAGNOSIS — I509 Heart failure, unspecified: Secondary | ICD-10-CM | POA: Diagnosis not present

## 2018-01-07 DIAGNOSIS — L89524 Pressure ulcer of left ankle, stage 4: Secondary | ICD-10-CM | POA: Diagnosis present

## 2018-01-13 LAB — AEROBIC CULTURE  (SUPERFICIAL SPECIMEN)

## 2018-01-13 LAB — AEROBIC CULTURE W GRAM STAIN (SUPERFICIAL SPECIMEN)

## 2018-01-14 DIAGNOSIS — L89524 Pressure ulcer of left ankle, stage 4: Secondary | ICD-10-CM | POA: Diagnosis not present

## 2018-01-23 DIAGNOSIS — L89524 Pressure ulcer of left ankle, stage 4: Secondary | ICD-10-CM | POA: Diagnosis not present

## 2018-04-01 ENCOUNTER — Ambulatory Visit: Payer: Medicare Other | Admitting: Adult Health

## 2018-04-27 DEATH — deceased

## 2018-05-27 IMAGING — CT CT HEAD W/O CM
3 of 8 series · 14 of 47 positions shown, 17 images · non-contrast
Comparison: Prior CT from 07/08/2016.

CLINICAL DATA: Initial evaluation for acute trauma, unwitnessed
fall.

EXAM:
CT HEAD WITHOUT CONTRAST
CT CERVICAL SPINE WITHOUT CONTRAST
TECHNIQUE: Multidetector CT imaging of the head and cervical spine was
performed following the standard protocol without intravenous
contrast. Multiplanar CT image reconstructions of the cervical spine
were also generated.

[Series 11: axial recon · axial · 0.27mm/px · z∈[-362,-190]mm · 10 of 122 slices shown, 13 images]
[im 12/122  brain]
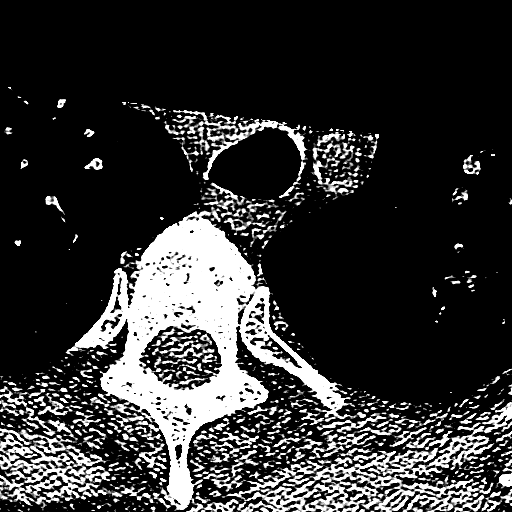
[im 12/122  bone]
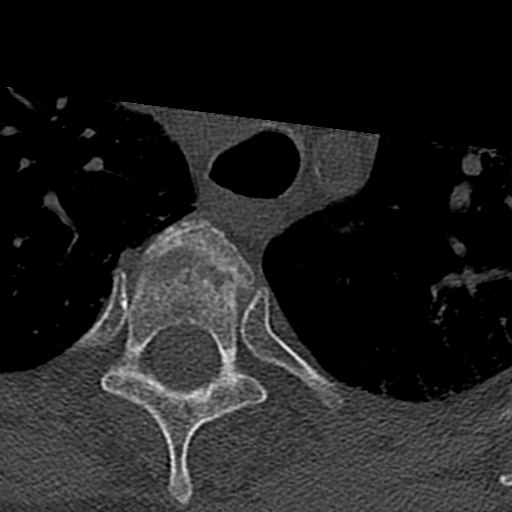
[im 23/122  brain]
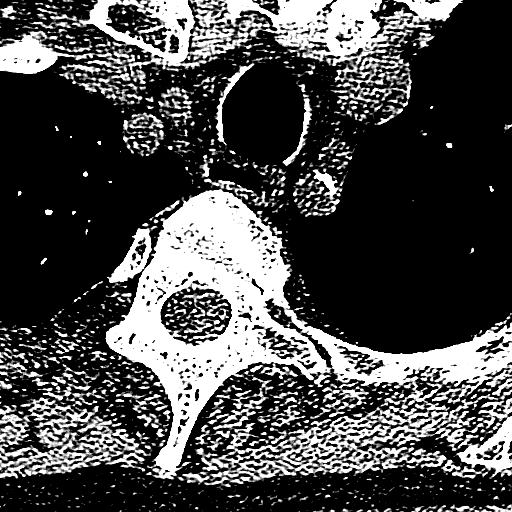
[im 34/122  brain]
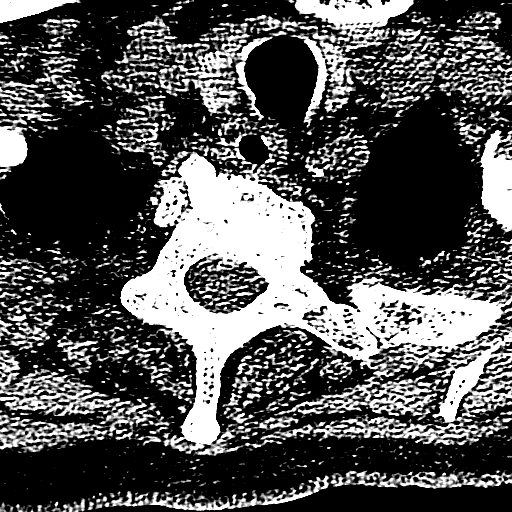
[im 45/122  brain]
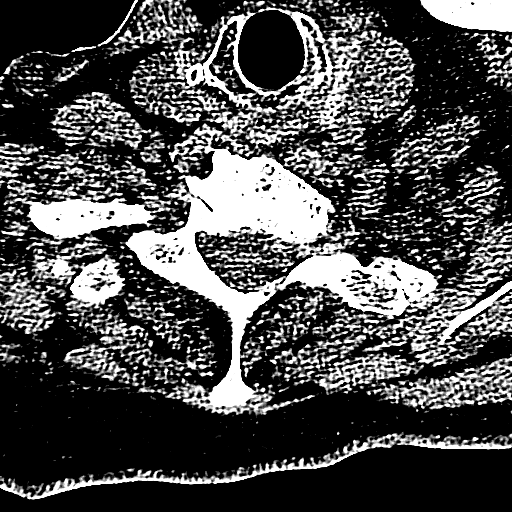
[im 56/122  brain]
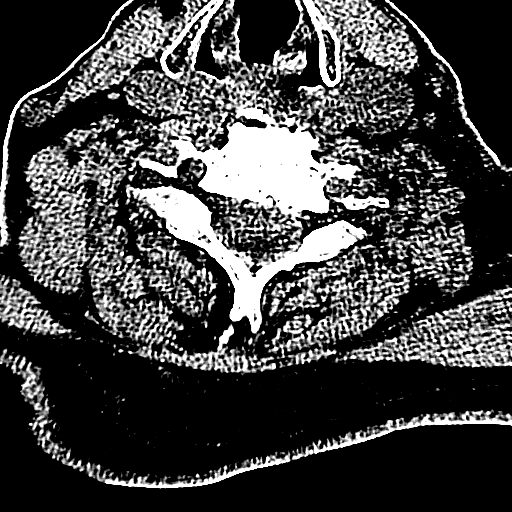
[im 56/122  bone]
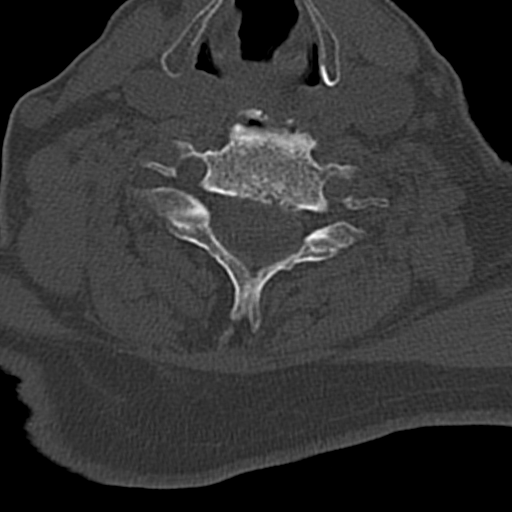
[im 67/122  brain]
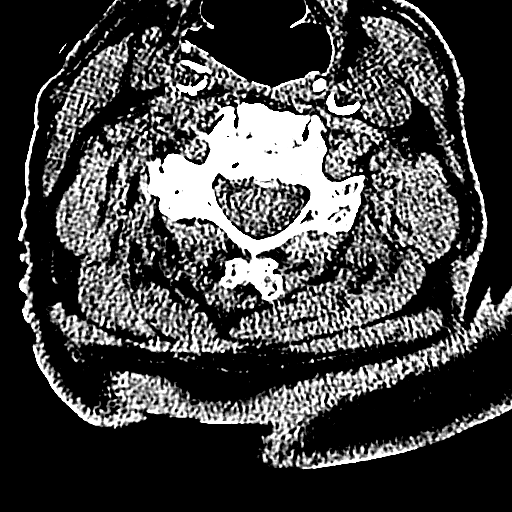
[im 78/122  brain]
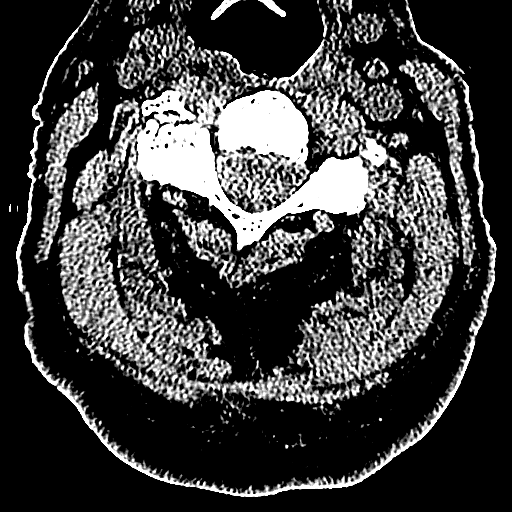
[im 89/122  brain]
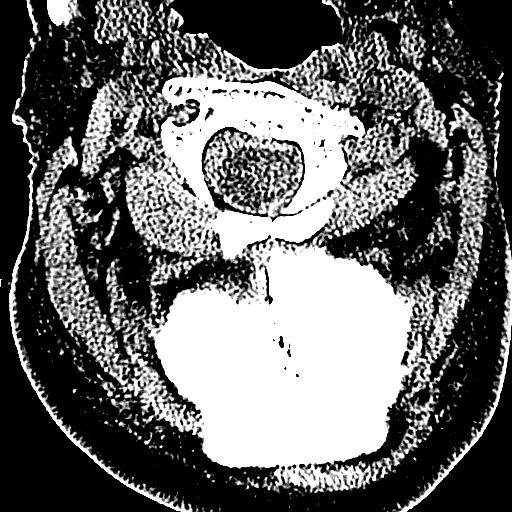
[im 100/122  brain]
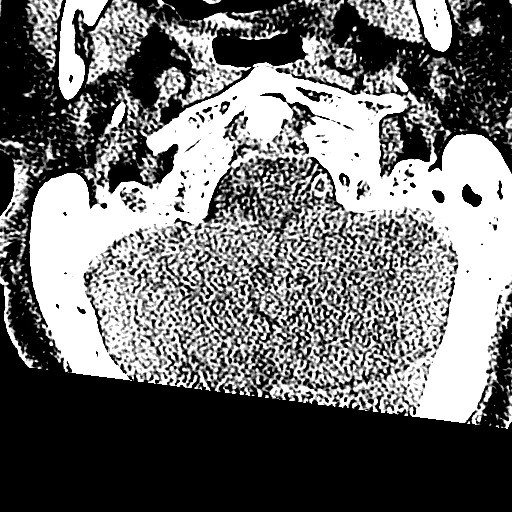
[im 100/122  bone]
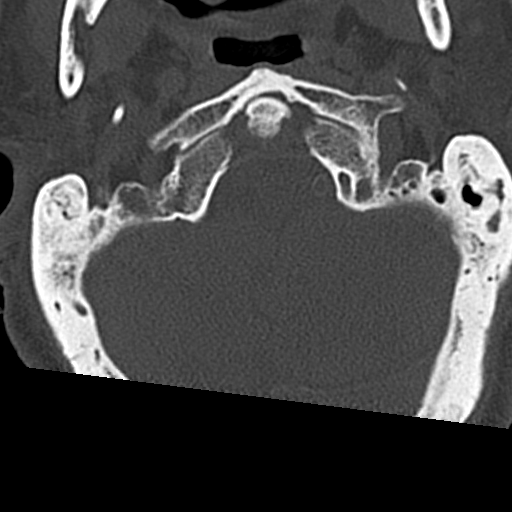
[im 111/122  brain]
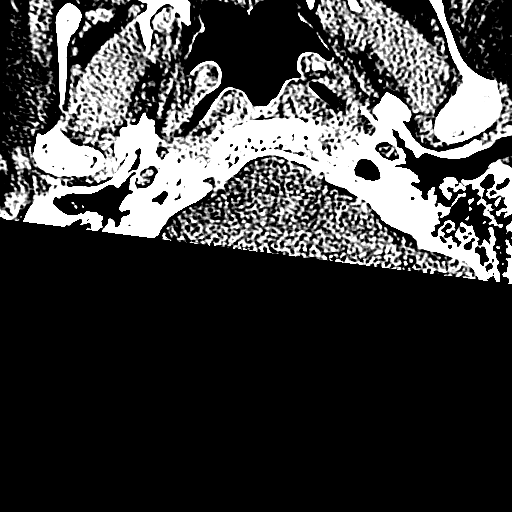

[Series 12: coronal · coronal · 0.27mm/px · 2 of 64 slices shown]
[im 41/64  brain]
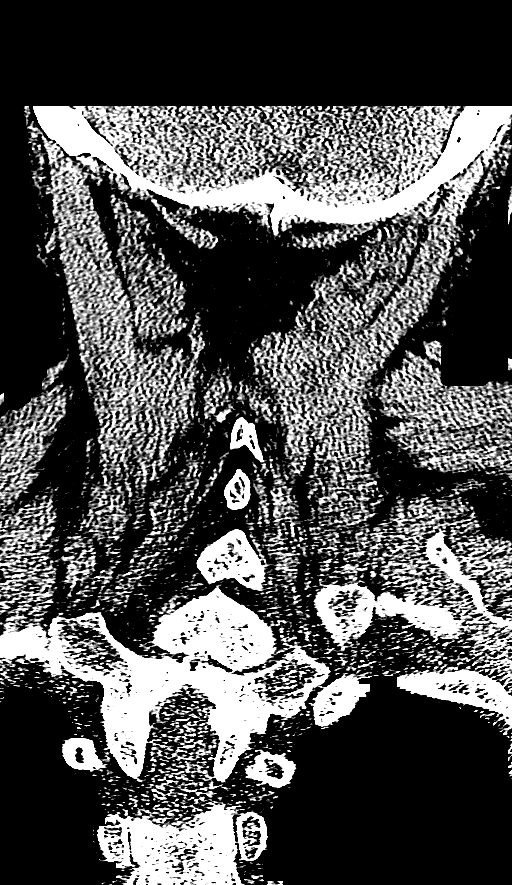
[im 52/64  brain]
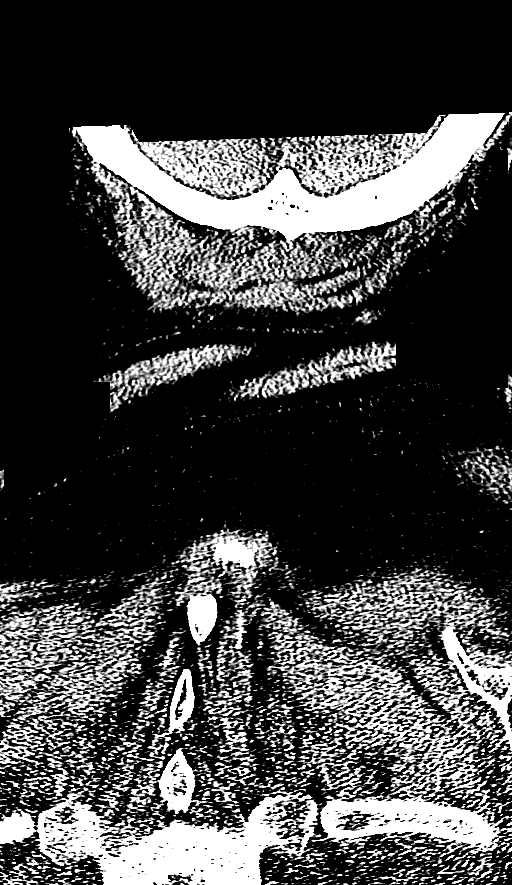

[Series 13: sagittal · sagittal · 0.37mm/px · 2 of 61 slices shown]
[im 21/61  brain]
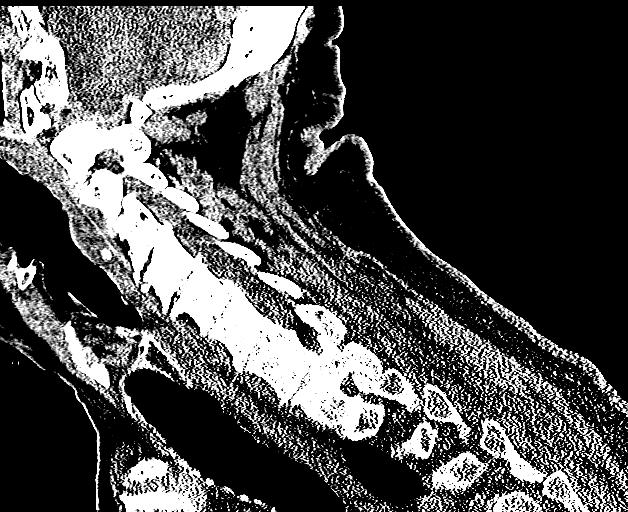
[im 41/61  brain]
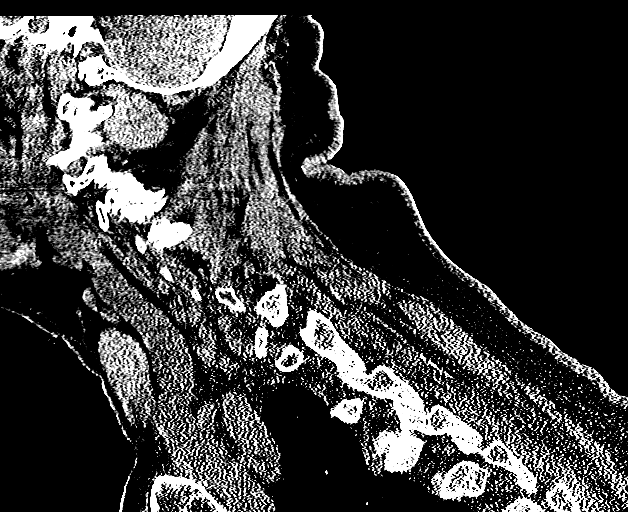

[14 of 47 positions shown; findings below may reference images not displayed]

FINDINGS: CT HEAD FINDINGS

Brain: Mild for age cerebral atrophy with moderate chronic
microvascular ischemic disease, stable. No acute intracranial
hemorrhage. No evidence for acute large vessel territory infarct. No
mass lesion, midline shift or mass effect. No hydrocephalus. No
extra-axial fluid collection.

Vascular: No hyperdense vessel. Scattered vascular calcifications
noted within the carotid siphons.

Skull: Scalp soft tissues demonstrate no acute abnormality.
Calvarium intact.

Sinuses/Orbits: Globes and orbital soft tissues within normal
limits. Paranasal sinuses and mastoids are clear.

CT CERVICAL SPINE FINDINGS

Alignment: Straightening of the normal cervical lordosis. Trace
anterolisthesis of C3 on C4, with trace retrolisthesis of C4 on C5.

Skull base and vertebrae: Skullbase intact. C1-2 articulations are
unchanged. Rotation of C1 on C2 likely positional. Dens is intact.
Vertebral body heights maintained. The no acute fracture.

Soft tissues and spinal canal: Visualized soft tissues of the neck
demonstrate no acute abnormality. No prevertebral edema. Prominent
vascular calcifications about the carotid bifurcations.

Disc levels: Moderate to advanced multilevel degenerative
spondylolysis, greatest at C4-5 and C5-6. Predominant right-sided
facet arthrosis.

Upper chest: Visualized upper chest demonstrates no acute
abnormality. Partially visualized lung apices are grossly clear.

Other:
IMPRESSION: CT BRAIN:

1. No acute intracranial process.
2. Stable atrophy with chronic microvascular ischemic disease.

CT CERVICAL SPINE:

1. No acute traumatic injury within cervical spine.
2. Stable alignment with advanced multilevel degenerative disc
disease.

## 2018-05-27 IMAGING — CT CT ABD-PELV W/O CM
2 of 4 series · 17 of 46 positions shown, 19 images · non-contrast
Comparison: 03/12/2016

CLINICAL DATA: Fall.  Anemia.

EXAM:
CT ABDOMEN AND PELVIS WITHOUT CONTRAST
TECHNIQUE: Multidetector CT imaging of the abdomen and pelvis was performed
following the standard protocol without IV contrast.

[Series 2: abd/pel w/o · axial · non-contrast · 0.82mm/px · z∈[-496,-66]mm · 14 of 98 slices shown, 16 images]
[im 6/98  soft-tissue]
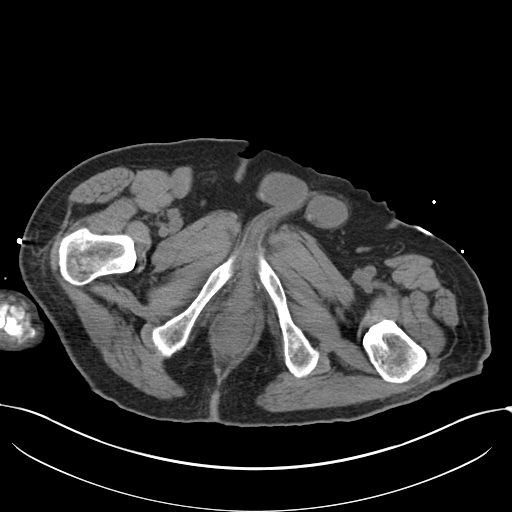
[im 6/98  bone]
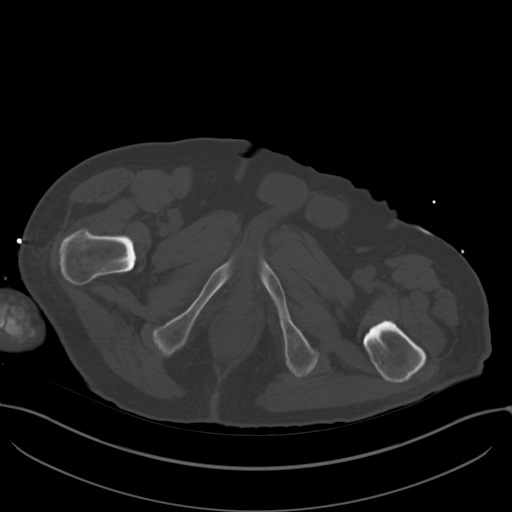
[im 11/98  soft-tissue]
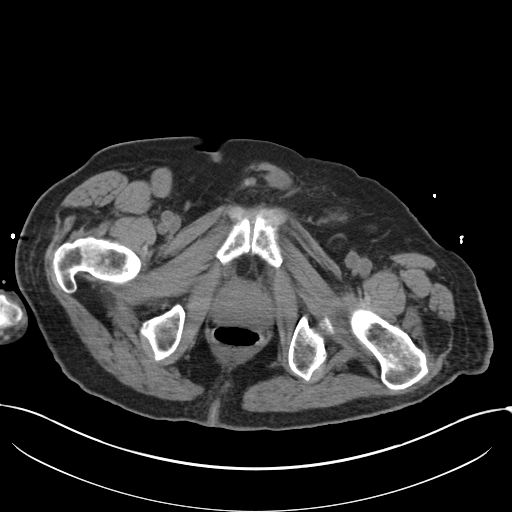
[im 21/98  soft-tissue]
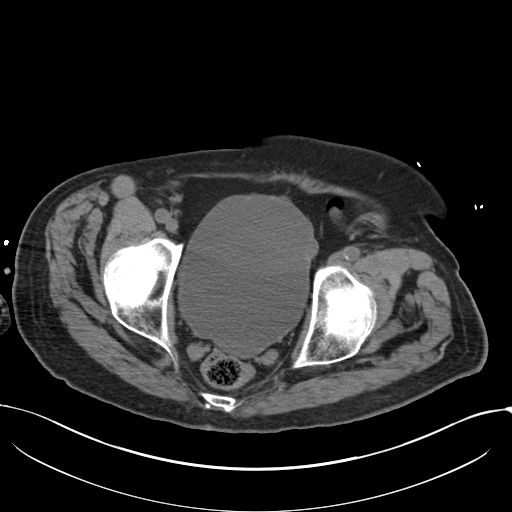
[im 26/98  soft-tissue]
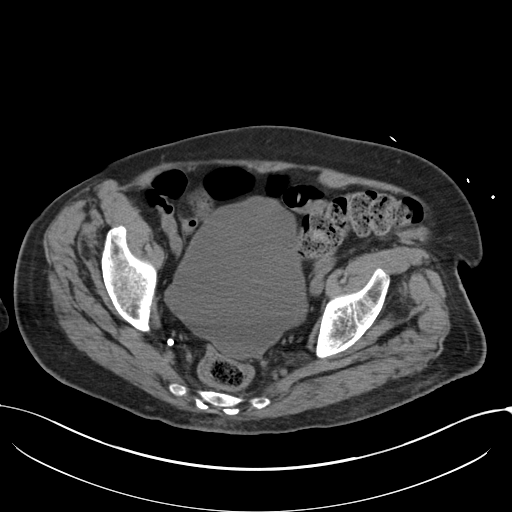
[im 31/98  soft-tissue]
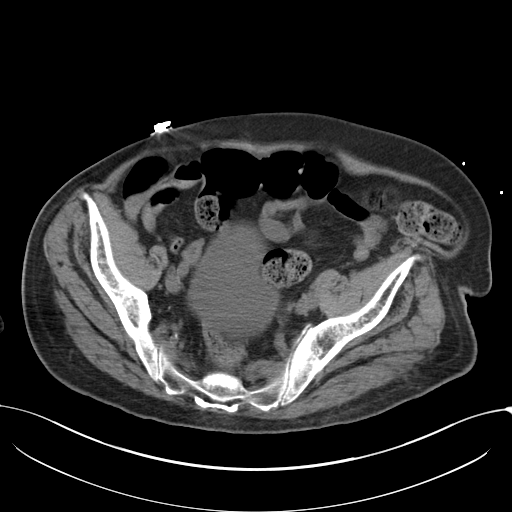
[im 41/98  soft-tissue]
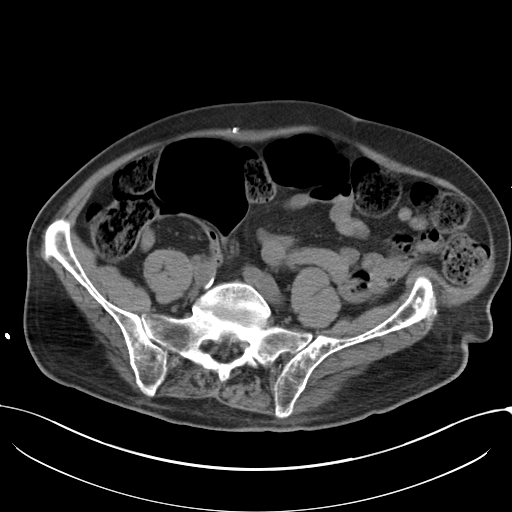
[im 46/98  soft-tissue]
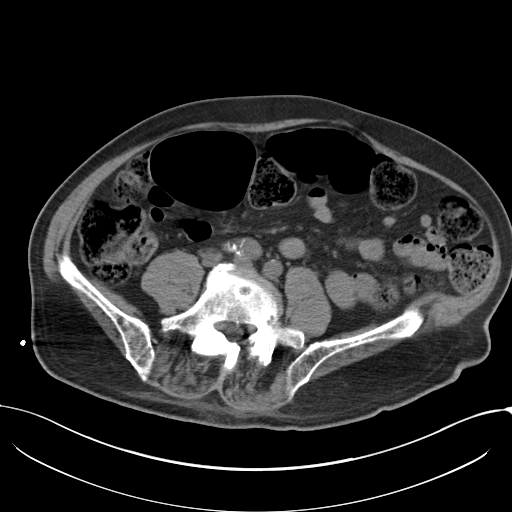
[im 52/98  soft-tissue]
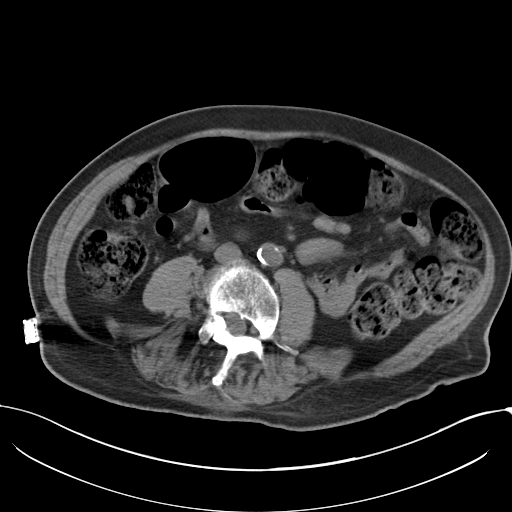
[im 57/98  soft-tissue]
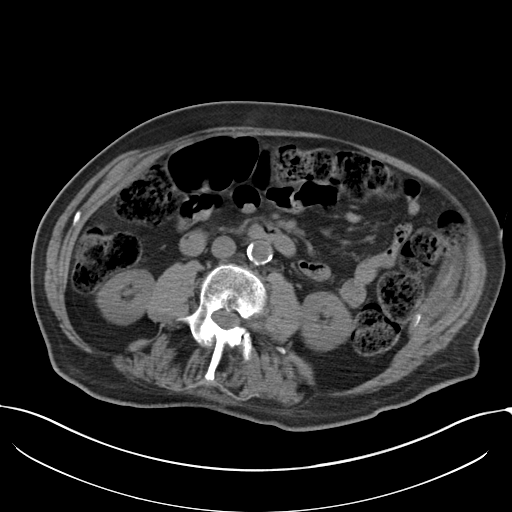
[im 57/98  bone]
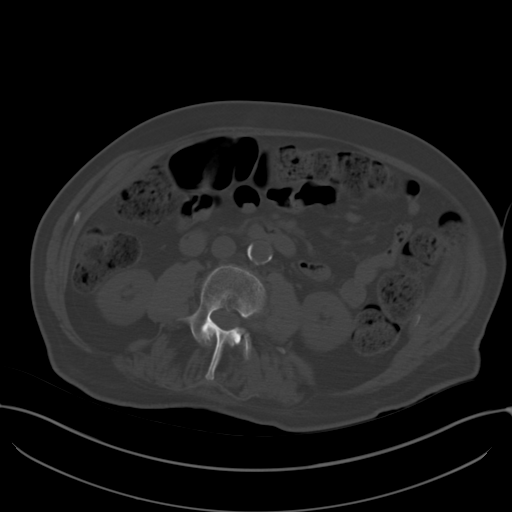
[im 67/98  soft-tissue]
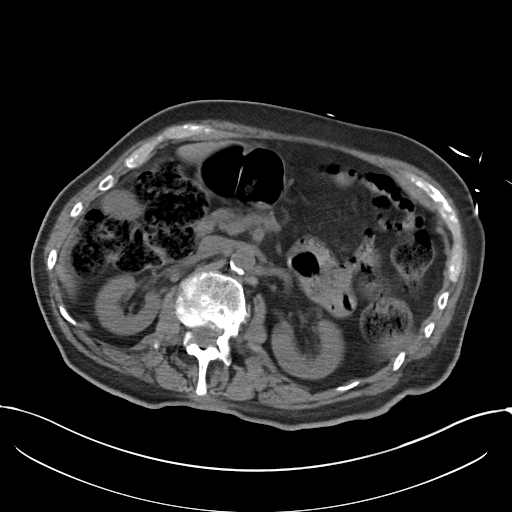
[im 72/98  soft-tissue]
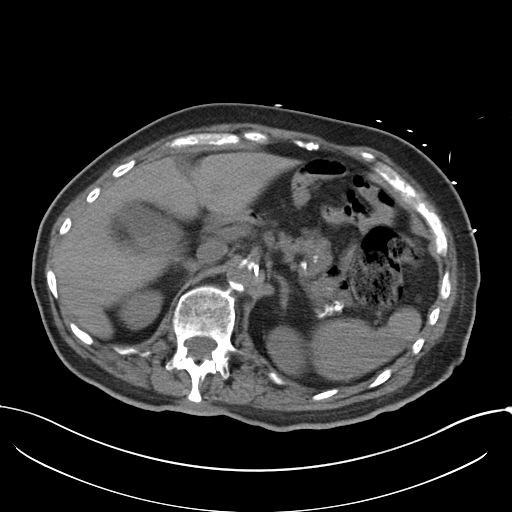
[im 77/98  soft-tissue]
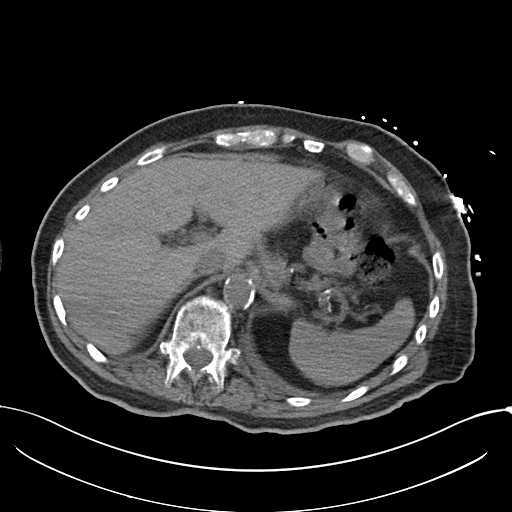
[im 87/98  soft-tissue]
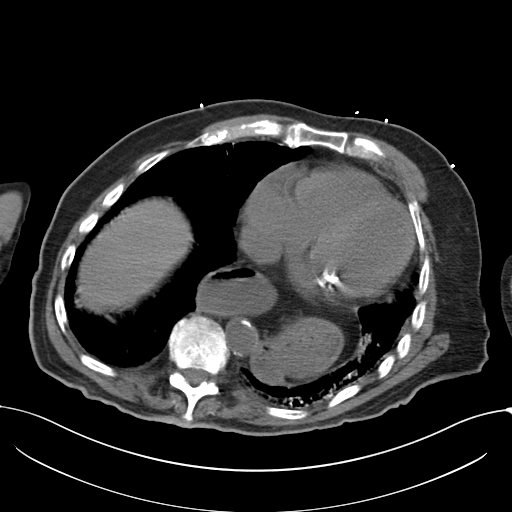
[im 92/98  soft-tissue]
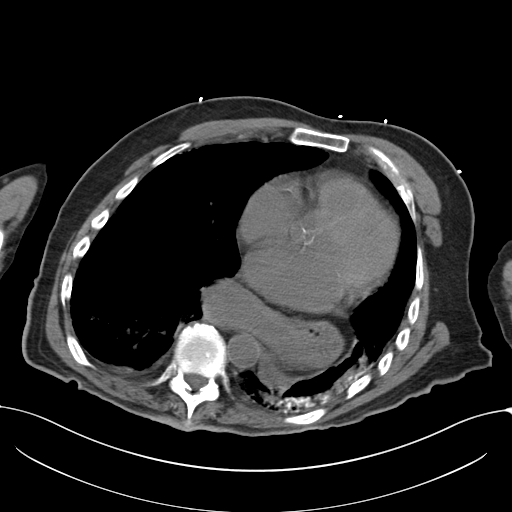

[Series 5: coronal · coronal · 0.81mm/px · 3 of 141 slices shown]
[im 47/141  soft-tissue]
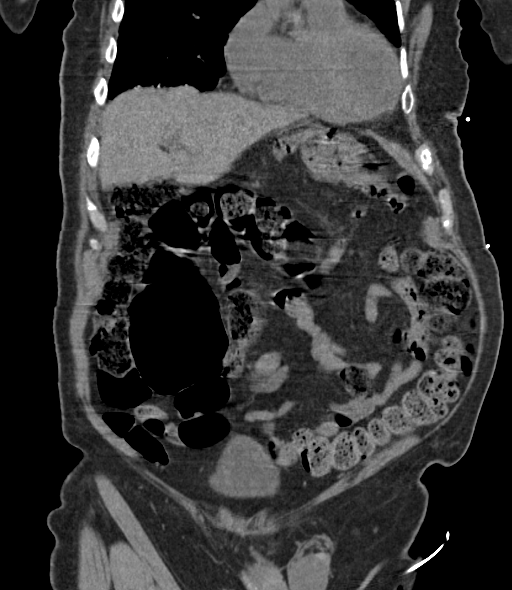
[im 63/141  soft-tissue]
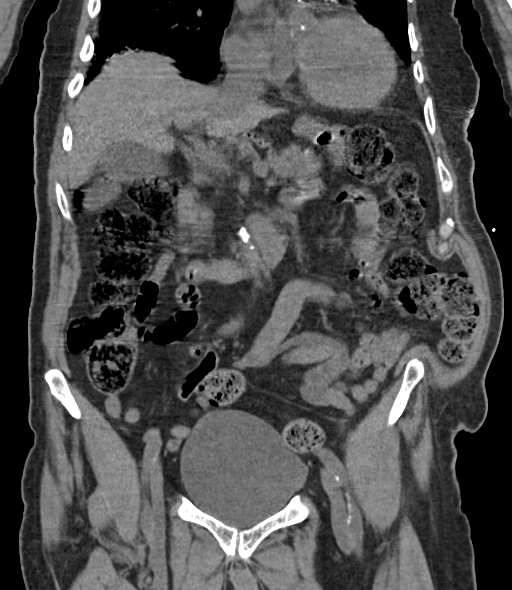
[im 78/141  soft-tissue]
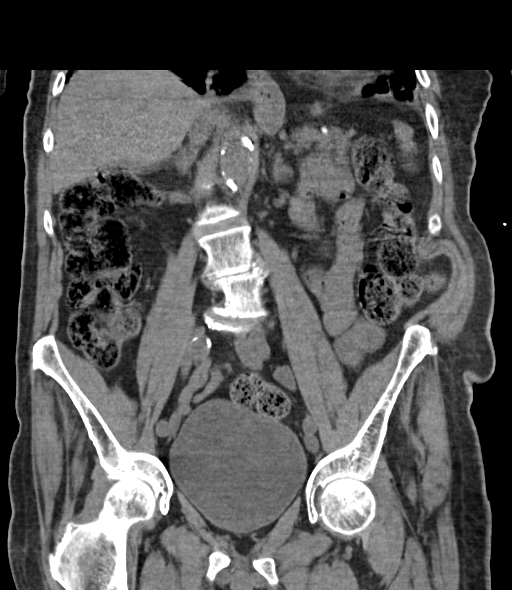

[17 of 46 positions shown; findings below may reference images not displayed]

FINDINGS: Lower chest: Large hiatal hernia. Heart is normal size. Trace
bilateral pleural effusions. Left lower lobe atelectasis.

Hepatobiliary: No focal hepatic abnormality. Gallbladder
unremarkable.

Pancreas: No focal abnormality or ductal dilatation.

Spleen: No focal abnormality.  Normal size.

Adrenals/Urinary Tract: No adrenal abnormality. No focal renal
abnormality. No stones or hydronephrosis. Urinary bladder is
unremarkable.

Stomach/Bowel: Moderate stool burden throughout the colon. No
evidence of obstruction.

Vascular/Lymphatic: Aortic and iliac calcifications. No aneurysm or
adenopathy.

Reproductive: No visible focal abnormality.

Other: No free fluid or free air.  No retroperitoneal hematoma.

Musculoskeletal: No acute bony abnormality.
IMPRESSION: Moderate stool burden throughout the colon.

Trace bilateral pleural effusions.  Left lower lobe atelectasis.

Large hiatal hernia.

No acute findings in the abdomen or pelvis. No retroperitoneal
hematoma.

## 2018-05-27 IMAGING — CR DG CHEST 2V
2 series · 2 of 2 positions shown · non-contrast
Comparison: Chest radiograph March 21, 2016

CLINICAL DATA: Unwitnessed fall in bathroom. Shortness of breath
and hypoxia.

EXAM:
CHEST  2 VIEW

[w chest lat]
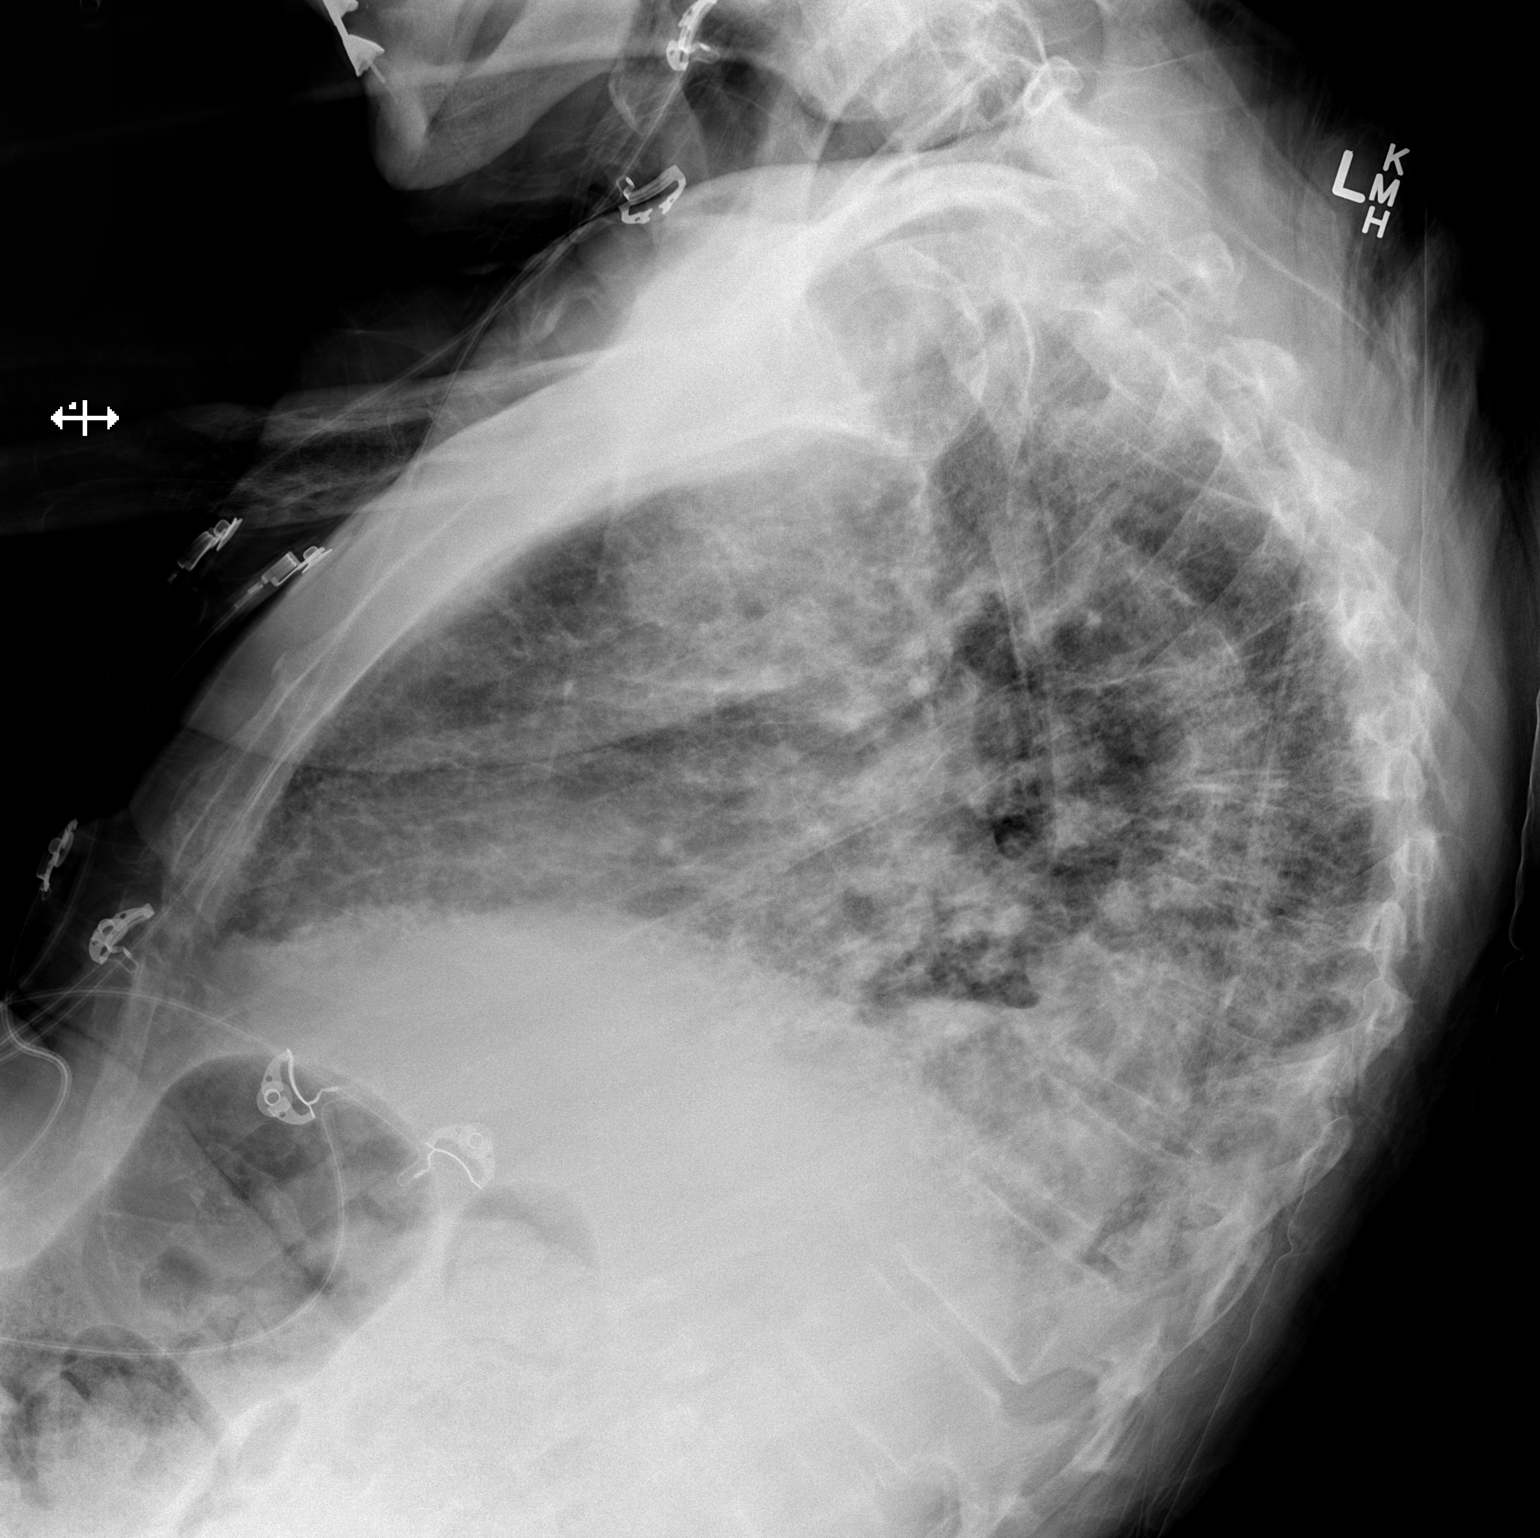

[x chest ap]
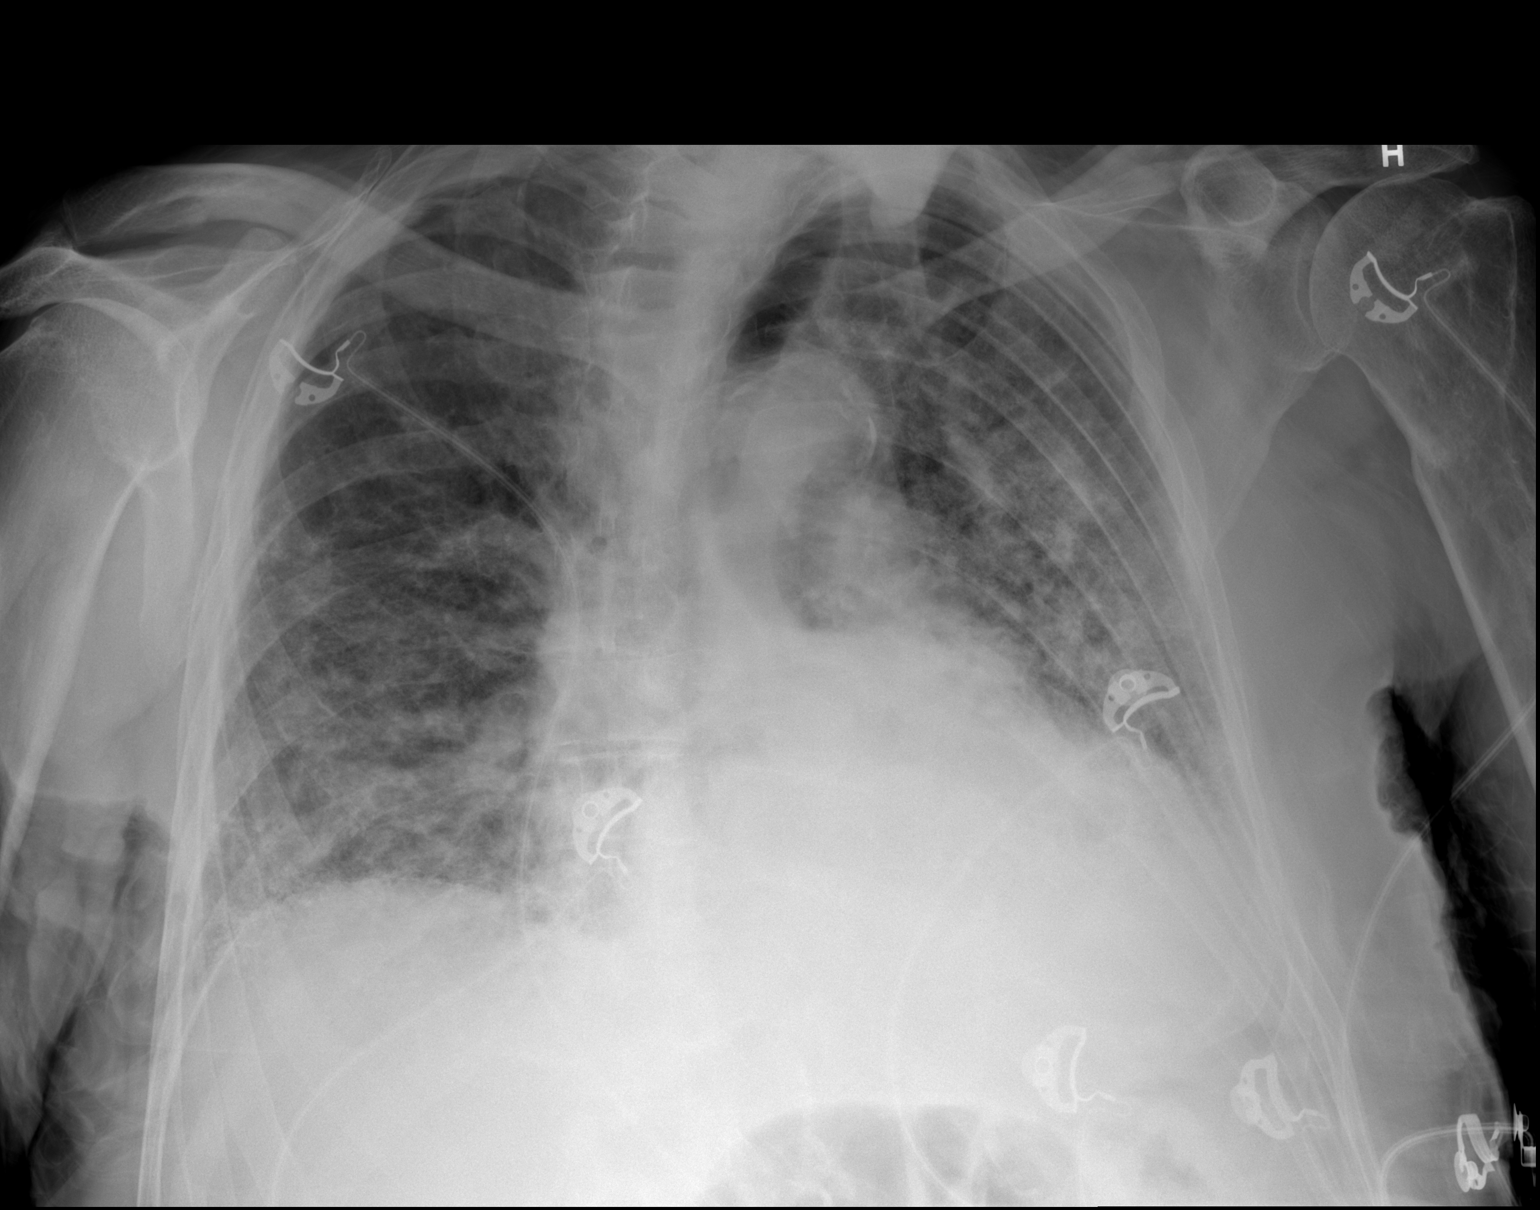

[2 of 2 positions shown; findings below may reference images not displayed]

FINDINGS: Cardiac silhouette is mildly enlarged. Tortuous calcified aorta.
Interstitial and alveolar airspace opacities for 7 prior imaging.
Small probable LEFT pleural effusion. No pneumothorax. Patient
rotated LEFT. Old LEFT humerus fracture. Large hiatal hernia.
IMPRESSION: Increasing interstitial and alveolar airspace opacities associated
with pulmonary edema and/or pneumonia.

Stable cardiomegaly.
